# Patient Record
Sex: Female | Born: 1965 | Race: Black or African American | Hispanic: No | Marital: Single | Smoking: Current every day smoker
Health system: Southern US, Community
[De-identification: ages and names within clinical notes are randomized; demographics above are authoritative.]

## PROBLEM LIST (undated history)

## (undated) DIAGNOSIS — J449 Chronic obstructive pulmonary disease, unspecified: Secondary | ICD-10-CM

## (undated) DIAGNOSIS — I509 Heart failure, unspecified: Secondary | ICD-10-CM

## (undated) DIAGNOSIS — Z720 Tobacco use: Secondary | ICD-10-CM

## (undated) DIAGNOSIS — F419 Anxiety disorder, unspecified: Secondary | ICD-10-CM

## (undated) DIAGNOSIS — I10 Essential (primary) hypertension: Secondary | ICD-10-CM

## (undated) DIAGNOSIS — Z9289 Personal history of other medical treatment: Secondary | ICD-10-CM

## (undated) HISTORY — PX: ABDOMINAL HYSTERECTOMY: SHX81

## (undated) HISTORY — PX: CHOLECYSTECTOMY: SHX55

## (undated) HISTORY — PX: HERNIA REPAIR: SHX51

---

## 2005-01-09 ENCOUNTER — Emergency Department: Payer: Self-pay | Admitting: Emergency Medicine

## 2005-03-22 ENCOUNTER — Emergency Department: Payer: Self-pay | Admitting: Emergency Medicine

## 2005-06-09 ENCOUNTER — Other Ambulatory Visit: Payer: Self-pay

## 2005-06-09 ENCOUNTER — Emergency Department: Payer: Self-pay | Admitting: Emergency Medicine

## 2005-06-11 ENCOUNTER — Emergency Department: Payer: Self-pay | Admitting: General Practice

## 2006-04-19 ENCOUNTER — Emergency Department: Payer: Self-pay | Admitting: Internal Medicine

## 2006-05-17 ENCOUNTER — Emergency Department: Payer: Self-pay | Admitting: Emergency Medicine

## 2009-08-11 ENCOUNTER — Emergency Department: Payer: Self-pay | Admitting: Unknown Physician Specialty

## 2010-01-13 ENCOUNTER — Ambulatory Visit: Payer: Self-pay | Admitting: Family Medicine

## 2012-01-26 LAB — CBC
HCT: 44.7 % (ref 35.0–47.0)
HGB: 14.7 g/dL (ref 12.0–16.0)
MCH: 28.4 pg (ref 26.0–34.0)
MCHC: 33 g/dL (ref 32.0–36.0)
MCV: 86 fL (ref 80–100)
RBC: 5.19 10*6/uL (ref 3.80–5.20)
RDW: 14.5 % (ref 11.5–14.5)
WBC: 10.9 10*3/uL (ref 3.6–11.0)

## 2012-01-26 LAB — COMPREHENSIVE METABOLIC PANEL
Alkaline Phosphatase: 96 U/L (ref 50–136)
BUN: 6 mg/dL — ABNORMAL LOW (ref 7–18)
Bilirubin,Total: 0.6 mg/dL (ref 0.2–1.0)
Co2: 31 mmol/L (ref 21–32)
Creatinine: 0.91 mg/dL (ref 0.60–1.30)
EGFR (Non-African Amer.): >60
Glucose: 125 mg/dL — ABNORMAL HIGH (ref 65–99)
Osmolality: 278 (ref 275–301)
SGOT(AST): 21 U/L (ref 15–37)
SGPT (ALT): 29 U/L (ref 12–78)
Total Protein: 7.7 g/dL (ref 6.4–8.2)

## 2012-01-26 LAB — TROPONIN I: Troponin-I: <0.02 ng/mL

## 2012-01-27 ENCOUNTER — Inpatient Hospital Stay: Payer: Self-pay | Admitting: Family Medicine

## 2012-01-27 LAB — SEDIMENTATION RATE: Erythrocyte Sed Rate: 5 mm/hr (ref 0–20)

## 2012-01-27 LAB — CK TOTAL AND CKMB (NOT AT ARMC)
CK, Total: 71 U/L (ref 21–215)
CK, Total: 63 U/L (ref 21–215)
CK-MB: <0.5 ng/mL — ABNORMAL LOW (ref 0.5–3.6)
CK-MB: 0.6 ng/mL (ref 0.5–3.6)

## 2012-01-27 LAB — TROPONIN I: Troponin-I: <0.02 ng/mL

## 2012-01-27 LAB — TSH: Thyroid Stimulating Horm: 0.292 u[IU]/mL — ABNORMAL LOW

## 2012-01-27 LAB — T4, FREE: Free Thyroxine: 0.87 ng/dL (ref 0.76–1.46)

## 2012-01-27 LAB — HEMOGLOBIN A1C: Hemoglobin A1C: 6 % (ref 4.2–6.3)

## 2012-01-28 LAB — BASIC METABOLIC PANEL
Anion Gap: 7 (ref 7–16)
Calcium, Total: 9.2 mg/dL (ref 8.5–10.1)
EGFR (African American): >60
EGFR (Non-African Amer.): >60
Glucose: 155 mg/dL — ABNORMAL HIGH (ref 65–99)
Osmolality: 278 (ref 275–301)
Sodium: 138 mmol/L (ref 136–145)

## 2012-01-28 LAB — CBC WITH DIFFERENTIAL/PLATELET
Basophil #: 0.1 10*3/uL (ref 0.0–0.1)
Basophil %: 0.6 %
Eosinophil %: 0 %
HCT: 43.1 % (ref 35.0–47.0)
HGB: 13.9 g/dL (ref 12.0–16.0)
Lymphocyte #: 1.2 10*3/uL (ref 1.0–3.6)
Lymphocyte %: 6.4 %
MCV: 87 fL (ref 80–100)
Monocyte %: 2.9 %
Neutrophil #: 16.6 10*3/uL — ABNORMAL HIGH (ref 1.4–6.5)
Platelet: 299 10*3/uL (ref 150–440)
RBC: 4.97 10*6/uL (ref 3.80–5.20)
RDW: 14.4 % (ref 11.5–14.5)
WBC: 18.5 10*3/uL — ABNORMAL HIGH (ref 3.6–11.0)

## 2012-01-28 LAB — MAGNESIUM: Magnesium: 1.4 mg/dL — ABNORMAL LOW

## 2012-01-29 LAB — BASIC METABOLIC PANEL
Anion Gap: 6 — ABNORMAL LOW (ref 7–16)
BUN: 11 mg/dL (ref 7–18)
Creatinine: 0.77 mg/dL (ref 0.60–1.30)
EGFR (Non-African Amer.): >60
Glucose: 116 mg/dL — ABNORMAL HIGH (ref 65–99)
Osmolality: 278 (ref 275–301)
Potassium: 4.4 mmol/L (ref 3.5–5.1)

## 2012-03-20 ENCOUNTER — Emergency Department: Payer: Self-pay | Admitting: Unknown Physician Specialty

## 2012-03-20 LAB — COMPREHENSIVE METABOLIC PANEL
Albumin: 3.1 g/dL — ABNORMAL LOW (ref 3.4–5.0)
Alkaline Phosphatase: 123 U/L (ref 50–136)
Anion Gap: 8 (ref 7–16)
BUN: 8 mg/dL (ref 7–18)
Calcium, Total: 9 mg/dL (ref 8.5–10.1)
Chloride: 97 mmol/L — ABNORMAL LOW (ref 98–107)
Creatinine: 1.09 mg/dL (ref 0.60–1.30)
Potassium: 3.7 mmol/L (ref 3.5–5.1)
SGOT(AST): 43 U/L — ABNORMAL HIGH (ref 15–37)
SGPT (ALT): 52 U/L (ref 12–78)
Total Protein: 8.4 g/dL — ABNORMAL HIGH (ref 6.4–8.2)

## 2012-03-20 LAB — URINALYSIS, COMPLETE
Bilirubin,UR: NEGATIVE
Ketone: NEGATIVE
Protein: NEGATIVE
RBC,UR: <1 /HPF (ref 0–5)
Specific Gravity: 1.002 (ref 1.003–1.030)
Squamous Epithelial: <1
WBC UR: 6 /HPF (ref 0–5)

## 2012-03-20 LAB — CBC
HCT: 45.1 % (ref 35.0–47.0)
HGB: 15.2 g/dL (ref 12.0–16.0)
MCHC: 33.7 g/dL (ref 32.0–36.0)
RBC: 5.27 10*6/uL — ABNORMAL HIGH (ref 3.80–5.20)
RDW: 14.6 % — ABNORMAL HIGH (ref 11.5–14.5)
WBC: 11.7 10*3/uL — ABNORMAL HIGH (ref 3.6–11.0)

## 2012-03-20 LAB — PRO B NATRIURETIC PEPTIDE: B-Type Natriuretic Peptide: 119 pg/mL

## 2012-03-20 LAB — RAPID INFLUENZA A&B ANTIGENS

## 2012-03-20 LAB — MAGNESIUM: Magnesium: 1.5 mg/dL — ABNORMAL LOW

## 2012-03-20 LAB — LIPASE, BLOOD: Lipase: 73 U/L (ref 73–393)

## 2012-03-20 LAB — TSH: Thyroid Stimulating Horm: 1.41 u[IU]/mL

## 2012-03-20 LAB — TROPONIN I: Troponin-I: <0.02 ng/mL

## 2012-03-25 LAB — CULTURE, BLOOD (SINGLE)

## 2012-04-09 LAB — EXPECTORATED SPUTUM ASSESSMENT W GRAM STAIN, RFLX TO RESP C

## 2013-02-09 ENCOUNTER — Emergency Department: Payer: Self-pay | Admitting: Emergency Medicine

## 2013-02-09 LAB — CBC
HCT: 46.6 % (ref 35.0–47.0)
MCV: 86 fL (ref 80–100)
Platelet: 313 10*3/uL (ref 150–440)
RBC: 5.41 10*6/uL — ABNORMAL HIGH (ref 3.80–5.20)
RDW: 14.5 % (ref 11.5–14.5)
WBC: 9.6 10*3/uL (ref 3.6–11.0)

## 2013-02-09 LAB — PRO B NATRIURETIC PEPTIDE: B-Type Natriuretic Peptide: 38 pg/mL (ref 0–125)

## 2013-02-09 LAB — URINALYSIS, COMPLETE
Bacteria: NONE SEEN
Bilirubin,UR: NEGATIVE
Ketone: NEGATIVE
Leukocyte Esterase: NEGATIVE
Nitrite: NEGATIVE
Ph: 6 (ref 4.5–8.0)
Protein: NEGATIVE
Squamous Epithelial: 5

## 2013-02-09 LAB — BASIC METABOLIC PANEL
Anion Gap: 4 — ABNORMAL LOW (ref 7–16)
Calcium, Total: 9.2 mg/dL (ref 8.5–10.1)
Chloride: 102 mmol/L (ref 98–107)
Co2: 31 mmol/L (ref 21–32)
EGFR (Non-African Amer.): >60
Glucose: 92 mg/dL (ref 65–99)
Osmolality: 272 (ref 275–301)
Potassium: 4 mmol/L (ref 3.5–5.1)
Sodium: 137 mmol/L (ref 136–145)

## 2013-09-04 ENCOUNTER — Emergency Department: Payer: Self-pay | Admitting: Emergency Medicine

## 2013-09-04 LAB — CBC
HCT: 50.4 % — AB (ref 35.0–47.0)
HGB: 15.9 g/dL (ref 12.0–16.0)
MCH: 27.6 pg (ref 26.0–34.0)
MCHC: 31.5 g/dL — AB (ref 32.0–36.0)
MCV: 87 fL (ref 80–100)
Platelet: 342 10*3/uL (ref 150–440)
RBC: 5.77 10*6/uL — ABNORMAL HIGH (ref 3.80–5.20)
RDW: 14.3 % (ref 11.5–14.5)
WBC: 11.1 10*3/uL — AB (ref 3.6–11.0)

## 2013-09-04 LAB — BASIC METABOLIC PANEL
ANION GAP: 5 — AB (ref 7–16)
BUN: 6 mg/dL — ABNORMAL LOW (ref 7–18)
CHLORIDE: 100 mmol/L (ref 98–107)
Calcium, Total: 9.3 mg/dL (ref 8.5–10.1)
Co2: 32 mmol/L (ref 21–32)
Creatinine: 0.97 mg/dL (ref 0.60–1.30)
EGFR (African American): >60
Glucose: 108 mg/dL — ABNORMAL HIGH (ref 65–99)
Osmolality: 272 (ref 275–301)
POTASSIUM: 3.3 mmol/L — AB (ref 3.5–5.1)
SODIUM: 137 mmol/L (ref 136–145)

## 2013-09-04 LAB — TROPONIN I: Troponin-I: <0.02 ng/mL

## 2013-09-09 ENCOUNTER — Inpatient Hospital Stay: Payer: Self-pay | Admitting: Internal Medicine

## 2013-09-09 LAB — URINALYSIS, COMPLETE
BACTERIA: NONE SEEN
BILIRUBIN, UR: NEGATIVE
Blood: NEGATIVE
Glucose,UR: NEGATIVE mg/dL (ref 0–75)
Ketone: NEGATIVE
Leukocyte Esterase: NEGATIVE
Nitrite: NEGATIVE
PH: 6 (ref 4.5–8.0)
Protein: NEGATIVE
RBC,UR: <1 /HPF (ref 0–5)
SPECIFIC GRAVITY: 1.011 (ref 1.003–1.030)
WBC UR: 1 /HPF (ref 0–5)

## 2013-09-09 LAB — CBC
HCT: 46.9 % (ref 35.0–47.0)
HGB: 15.4 g/dL (ref 12.0–16.0)
MCH: 28.9 pg (ref 26.0–34.0)
MCHC: 32.8 g/dL (ref 32.0–36.0)
MCV: 88 fL (ref 80–100)
Platelet: 285 10*3/uL (ref 150–440)
RBC: 5.32 10*6/uL — ABNORMAL HIGH (ref 3.80–5.20)
RDW: 14.2 % (ref 11.5–14.5)
WBC: 8.2 10*3/uL (ref 3.6–11.0)

## 2013-09-09 LAB — COMPREHENSIVE METABOLIC PANEL
ALT: 31 U/L (ref 12–78)
Albumin: 3.5 g/dL (ref 3.4–5.0)
Alkaline Phosphatase: 109 U/L
Anion Gap: 7 (ref 7–16)
BILIRUBIN TOTAL: 0.3 mg/dL (ref 0.2–1.0)
BUN: 8 mg/dL (ref 7–18)
CALCIUM: 9.3 mg/dL (ref 8.5–10.1)
Chloride: 100 mmol/L (ref 98–107)
Co2: 31 mmol/L (ref 21–32)
Creatinine: 0.78 mg/dL (ref 0.60–1.30)
EGFR (Non-African Amer.): >60
Glucose: 82 mg/dL (ref 65–99)
Osmolality: 273 (ref 275–301)
Potassium: 3.8 mmol/L (ref 3.5–5.1)
SGOT(AST): 25 U/L (ref 15–37)
Sodium: 138 mmol/L (ref 136–145)
Total Protein: 8.2 g/dL (ref 6.4–8.2)

## 2013-09-09 LAB — TROPONIN I

## 2013-09-09 LAB — D-DIMER(ARMC): D-Dimer: 3104 ng/ml

## 2013-09-10 DIAGNOSIS — I369 Nonrheumatic tricuspid valve disorder, unspecified: Secondary | ICD-10-CM

## 2013-09-10 LAB — CBC WITH DIFFERENTIAL/PLATELET
BASOS PCT: 0.8 %
Basophil #: 0.1 10*3/uL (ref 0.0–0.1)
EOS ABS: 0.2 10*3/uL (ref 0.0–0.7)
Eosinophil %: 2.2 %
HCT: 44.1 % (ref 35.0–47.0)
HGB: 14.5 g/dL (ref 12.0–16.0)
LYMPHS PCT: 21.1 %
Lymphocyte #: 1.8 10*3/uL (ref 1.0–3.6)
MCH: 28.8 pg (ref 26.0–34.0)
MCHC: 32.8 g/dL (ref 32.0–36.0)
MCV: 88 fL (ref 80–100)
Monocyte #: 0.5 x10 3/mm (ref 0.2–0.9)
Monocyte %: 6.3 %
Neutrophil #: 5.8 10*3/uL (ref 1.4–6.5)
Neutrophil %: 69.6 %
Platelet: 290 10*3/uL (ref 150–440)
RBC: 5.02 10*6/uL (ref 3.80–5.20)
RDW: 13.8 % (ref 11.5–14.5)
WBC: 8.4 10*3/uL (ref 3.6–11.0)

## 2013-09-10 LAB — BASIC METABOLIC PANEL
Anion Gap: 6 — ABNORMAL LOW (ref 7–16)
BUN: 7 mg/dL (ref 7–18)
CALCIUM: 8.4 mg/dL — AB (ref 8.5–10.1)
CHLORIDE: 97 mmol/L — AB (ref 98–107)
Co2: 34 mmol/L — ABNORMAL HIGH (ref 21–32)
Creatinine: 0.79 mg/dL (ref 0.60–1.30)
EGFR (African American): >60
EGFR (Non-African Amer.): >60
Glucose: 106 mg/dL — ABNORMAL HIGH (ref 65–99)
Osmolality: 272 (ref 275–301)
Potassium: 3.3 mmol/L — ABNORMAL LOW (ref 3.5–5.1)
SODIUM: 137 mmol/L (ref 136–145)

## 2013-09-14 LAB — CULTURE, BLOOD (SINGLE)

## 2014-03-26 ENCOUNTER — Emergency Department: Payer: Self-pay | Admitting: Emergency Medicine

## 2014-03-26 LAB — URINALYSIS, COMPLETE
BILIRUBIN, UR: NEGATIVE
Blood: NEGATIVE
GLUCOSE, UR: NEGATIVE mg/dL (ref 0–75)
Ketone: NEGATIVE
LEUKOCYTE ESTERASE: NEGATIVE
NITRITE: NEGATIVE
PROTEIN: NEGATIVE
Ph: 6 (ref 4.5–8.0)
RBC,UR: 1 /HPF (ref 0–5)
SPECIFIC GRAVITY: 1.011 (ref 1.003–1.030)
Squamous Epithelial: 1

## 2014-03-26 LAB — COMPREHENSIVE METABOLIC PANEL
ALBUMIN: 3.2 g/dL — AB (ref 3.4–5.0)
Alkaline Phosphatase: 93 U/L
Anion Gap: 3 — ABNORMAL LOW (ref 7–16)
BUN: 6 mg/dL — ABNORMAL LOW (ref 7–18)
Bilirubin,Total: 0.6 mg/dL (ref 0.2–1.0)
CO2: 34 mmol/L — AB (ref 21–32)
Calcium, Total: 8.5 mg/dL (ref 8.5–10.1)
Chloride: 105 mmol/L (ref 98–107)
Creatinine: 0.76 mg/dL (ref 0.60–1.30)
EGFR (Non-African Amer.): >60
Glucose: 83 mg/dL (ref 65–99)
OSMOLALITY: 280 (ref 275–301)
Potassium: 3.9 mmol/L (ref 3.5–5.1)
SGOT(AST): 20 U/L (ref 15–37)
SGPT (ALT): 32 U/L
Sodium: 142 mmol/L (ref 136–145)
TOTAL PROTEIN: 7.4 g/dL (ref 6.4–8.2)

## 2014-03-26 LAB — CBC
HCT: 48.5 % — AB (ref 35.0–47.0)
HGB: 15.9 g/dL (ref 12.0–16.0)
MCH: 28.6 pg (ref 26.0–34.0)
MCHC: 32.7 g/dL (ref 32.0–36.0)
MCV: 87 fL (ref 80–100)
Platelet: 303 10*3/uL (ref 150–440)
RBC: 5.55 10*6/uL — ABNORMAL HIGH (ref 3.80–5.20)
RDW: 14.1 % (ref 11.5–14.5)
WBC: 5.3 10*3/uL (ref 3.6–11.0)

## 2014-03-26 LAB — TROPONIN I: Troponin-I: <0.02 ng/mL

## 2014-09-01 NOTE — H&P (Signed)
PATIENT NAME:  [name], [name] MR#:  [id number] DATE OF BIRTH:  August 10, 1965  DATE OF ADMISSION:  01/27/2012  REFERRING PHYSICIAN: Dr. Zenda Alpers in the Emergency Room  FAMILY PHYSICIAN: Non-local   REASON FOR ADMISSION: Acute respiratory failure with shortness of breath and atypical chest pain.   HISTORY OF PRESENT ILLNESS: The patient is a 49 year old female with a history of anxiety, reflux, and tobacco abuse who presents to the Emergency Room with a 1 to 2-day history of worsening shortness of breath associated with localized left chest pain radiating to the left arm. Pain is worse with deep inspiration. She does smoke a pack a day. No documented history of chronic obstructive pulmonary disease. She states that she has a history of "pulmonary nodules".  In the Emergency Room, the patient was found to have an elevated Angel-dimer. She was hypoxic. Chest x-ray revealed diffuse opacities with small nodules. No pulmonary embolism was noted. BNP was negative and her cardiac enzymes were unremarkable. She is now admitted for further evaluation.   PAST MEDICAL HISTORY:  1. Tobacco abuse.  2. Anxiety/depression.  3. Obesity.  4. Gastroesophageal reflux disease.  5. Fibrocystic breast disease.  6. Status post cholecystectomy.  7. Status post hysterectomy.   MEDICATIONS:  1. Zantac 150 mg p.o. twice a day p.r.n.  2. Flexeril 10 mg p.o. q. 8 hours p.r.n.  3. Ativan 0.5 mg p.o. q. 8 hours p.r.n.   ALLERGIES: No known drug allergies.   SOCIAL HISTORY: The patient smokes 1 pack per day. No history of alcohol abuse.   FAMILY HISTORY: Positive for diabetes, stroke, and hypertension.    REVIEW OF SYSTEMS: CONSTITUTIONAL: No fever or change in weight. EYES: No blurred or double vision. No glaucoma. ENT: No tinnitus or hearing loss. No nasal discharge or bleeding. No difficulty swallowing. RESPIRATORY: The patient has had cough and wheezing. She has had painful respirations. No hemoptysis. CARDIOVASCULAR: Chest  pain as per history of present illness. No orthopnea. Denies palpitations or syncope. GI: No nausea, vomiting, or diarrhea. No abdominal pain. No change in bowel habits. GU: No dysuria or hematuria. No incontinence. ENDOCRINE: No polyuria or polydipsia. No heat or cold intolerance. HEMATOLOGIC: The patient denies anemia, easy bruising, or bleeding. LYMPHATIC: No swollen glands. MUSCULOSKELETAL: The patient denies pain in her neck, back, shoulders, knees, or hips. No gout. NEUROLOGIC: No numbness or weakness. Denies migraines, stroke, or seizures. PSYCH: The patient denies anxiety, insomnia, or depression.   PHYSICAL EXAMINATION:  GENERAL: The patient is obese, in no acute distress.   VITAL SIGNS: Vital signs are currently remarkable for a blood pressure of 109/53, with a heart rate of 86 and a respiratory rate of 24. Temperature is 98.9. Sats 88% on room air.   HEENT: Normocephalic, atraumatic. Pupils equally round and reactive to light and accommodation. Extraocular movements are intact. Sclerae are anicteric. Conjunctivae are clear.  Oropharynx is clear.    NECK: Supple without jugular venous distention. No adenopathy or thyromegaly is noted.   LUNGS: Decreased breath sounds throughout. There are mild expiratory wheezes. No rhonchi or rales are noted. Respiratory effort is currently mildly increased.   CARDIAC: Regular rate and rhythm with normal S1 and S2. No significant rubs, murmurs, or gallops. PMI is nondisplaced. Chest wall is nontender.   ABDOMEN: Soft, nontender, with normoactive bowel sounds. No organomegaly or masses were appreciated. No hernias or bruits were noted.   EXTREMITIES: Trace edema. No clubbing or cyanosis. Pulses were 2+ bilaterally.   SKIN: Warm and dry  without rash or lesions.   NEUROLOGIC: Cranial nerves II through XII grossly intact. Deep tendon reflexes were symmetric. Motor and sensory examination nonfocal.   PSYCH: The patient was alert and oriented to person,  place, and time. She was cooperative and used good judgment.   LABORATORY, DIAGNOSTIC, AND RADIOLOGICAL DATA: EKG revealed sinus tachycardia with no acute ischemic changes. CT of the chest revealed no pulmonary embolism. There were diffuse abnormal lung opacities with ground-glass/interstitial opacities, tiny emphysematous changes and nodules. Prominent mediastinal nodes were noted. Troponin was less than 0.02. Total CK was 90 with an MB of less than 0.5. BNP was 42. Angel-dimer is 1.98. White count was 10.9 with a hemoglobin of 14.7. Glucose 125 with a BUN of 6 and a creatinine of 0.91 with a sodium of 140 and potassium 3. GFR was greater than 60.   ASSESSMENT:  1. Acute respiratory failure with hypoxia.  2. Presumed pneumonia.  3. Chronic obstructive pulmonary disease exacerbation.  4. Hyperglycemia.  5. Hypokalemia.  6. Obesity.  7. Anxiety.   PLAN: The patient will be admitted to telemetry. Given her chest pain, we will follow serial cardiac enzymes and obtain an echocardiogram. We will consult pulmonology in regards to the patient's acute respiratory insufficiency associated with her abnormal chest x-ray. We will begin IV steroids, IV antibiotics, SVNs, and oxygen. We will wean her oxygen as tolerated. We will follow her sugars while on steroids. We will supplement her potassium. Follow up routine labs and a chest x-ray within 24 hours. Further treatment and evaluation will depend upon the patient's progress.   TOTAL TIME SPENT: 50 minutes.  ____________________________ Duane LopeJeffrey Angel. Judithann SheenSparks, MD jds:bjt Angel:  01/27/2012 05:46:53 ET          T: 01/27/2012 08:07:21 ET         JOB#: [id number] [name] Angel Aws Shere MD ELECTRONICALLY SIGNED 01/29/2012 20:33

## 2014-09-01 NOTE — Discharge Summary (Signed)
PATIENT NAME:  [name], [name] MR#:  [id number] DATE OF BIRTH:  05-20-1965  DATE OF ADMISSION:  01/27/2012 DATE OF DISCHARGE:  01/29/2012  REASON FOR ADMISSION: Acute respiratory failure with shortness of breath and atypical chest pain.   CONDITION AT DISCHARGE: Good.  FOLLOW UP: [facility] at [facility].   HOSPITAL COURSE: [name] is a very nice 49 year old female with history of anxiety, reflux, tobacco abuse who came to the Emergency Room on 01/27/2012 with a history of worsening of shortness of breath during the past two days. The shortness of breath was associated with left chest pain radiating to the left arm which got worse with every deep inspiration. The patient also was having some cough that was not helping the pain.   The patient is a smoker and she smokes about a pack a day. She has no prior history of documented chronic obstructive pulmonary disease although the patient has been seen by [facility] for this problem and has a history of pulmonary nodules.   Her d-dimer was elevated for what she had a CT scan of the chest that failed to show any type of pulmonary embolism. The patient was hypoxic at the time but again no PE was found.   Her BNP was negative and she does not have any cardiac history. She did have cardiac enzymes that were negative. The patient was admitted for treatment of acute bronchitis, possible chronic obstructive pulmonary disease and pneumonia. The patient had bilateral opacities that could be related to community-acquired pneumonia for what the patient was put on Rocephin and azithromycin.   The patient had some improvement with steroids and after a couple of days of hospitalization she was discharged on the third day.   Her hypoxemia improved and she was able to be discharged on room air without any following oxygen.   As far as important results to mention, whenever she came over here her blood sugars were in the 120s. We did a hemoglobin A1c with which was 6.  Due to the steroids the blood sugars increased to 170s and 200s and at discharge they are around 129.   Her creatinine was normal, 0.7 at discharge. Electrolytes remained normal during this hospitalization, although at admission her potassium was decreased at 3.   Her magnesium was 1.4, and after repleting the magnesium was 2.   Her white count admission was 10.9, at discharge is 18.5 and this is due to the steroids. Cardiac enzymes were done and they were negative x3. She had a TSH that was 0.292 with a normal T4 free. She had a C-reactive protein which was elevated at 95 and we did an angiotensin coenzyme to rule out the possibility of sarcoid and it was normal at 27.   She had an echocardiogram Doppler that shows left ventricular systolic function with normal limits with an ejection fraction of 55, mild concentric left ventricular hypertrophy and trace mitral regurgitation. She had elevated pulmonary pressures of 30 to 40 mmHg with trace tricuspid regurgitation.   CT scan of the chest showed no pulmonary embolus, multiple small nodules through the lungs ill-defined some of them while others were well defined, the largest was measuring 6 mm. On the specific findings about inflammatory versus infectious process recommendation to follow up CT in three months. She does have mild enlarged mediastinal lymph nodes that were also nonspecific and suggest follow up on this. There is a 1.5 cm nodule on the right lobe of the thyroid as well.  Apparently the patient is aware of these nodules. [facility] what we are going to discharge with [facility]. As far as her thyroid disease the patient got a CT scan of the chest that had iodine contrast. We were not able to do an iodine uptake so the patient needs to have one within 30 days after the CT scan which was done on 09/14, so anywhere after 10/14 she could have the thyroid scan. We are going to send this discharge  summary [facility] so she follows up on that.    As far as her chest pain relief and it was mostly pleuritic. Other problems during this hospitalization: Smoking patient has been counseled about smoking cessation and she promised to try to quit.   As far as pneumonia the patient is going to be treated with Augmentin and azithromycin outpatient and she needs new PFTs to prove if this is chronic obstructive pulmonary disease or not.   Hyperglycemia was due to steroids. Her hemoglobin A1c was 6.   Thyroid nodule, as mentioned above, needs to be followed up within 30 days and her anxiety was stable during this hospitalization.   MEDICATIONS AT DISCHARGE: 1. Prednisone taper. 2. Albuterol inhaler. 3. Azithromycin 500 mg for 10 days. 4. Amoxicillin with clavulanic 875 twice daily. 5. Fluticasone salmeterol 1 puff twice a day.  6. Flexeril 10 mg daily.  7. Ranitidine 150 mg daily. 8. Clonazepam p.r.n. anxiety. 9. Aspirin 325 mg daily. 10. Zoloft 50 mg once daily.  11. As far as her inhalers the patient is going to be seeing her doctor sometime and she can get samples there. I am going to give her the inhaler that she was using over here and she didn't want to have a prescription since they are so expensive. We are going to give her two prescriptions form [facility] since the patient has to do self payment but the inhaler is the one that she is going to get through her primary care physician.   ____________________________ Felipa Furnaceoberto Sanchez Gutierrez, MD rsg:cms D: 01/29/2012 11:23:21 ET T: 01/29/2012 11:36:50 ET JOB#: [id number]  cc: Felipa Furnaceoberto Sanchez Gutierrez, MD, <Dictator> [facility]  Angel Marzo Juanda ChanceSANCHEZ GUTIERRE MD ELECTRONICALLY SIGNED 02/13/2012 13:41

## 2014-09-01 NOTE — Discharge Summary (Signed)
PATIENT NAME:  [name], Angel D MR#:  [id number] DATE OF BIRTH:  14-Jul-1965  DATE OF ADMISSION:  01/27/2012 DATE OF DISCHARGE:    ADDENDUM: Smoking cessation has been given to the patient and the patient promised to quit. She is going to try to get nicotine patches.   I spent about 38 minutes with this discharge.  ____________________________ Felipa Furnaceoberto Sanchez Gutierrez, MD rsg:slb D: 01/29/2012 11:24:29 ET T: 01/29/2012 11:29:53 ET JOB#: [id number]  cc: Felipa Furnaceoberto Sanchez Gutierrez, MD, <Dictator> Surya Folden Juanda ChanceSANCHEZ GUTIERRE MD ELECTRONICALLY SIGNED 02/13/2012 13:41

## 2014-09-05 NOTE — Discharge Summary (Signed)
PATIENT NAME:  [name], [name] MR#:  [id number] DATE OF BIRTH:  19-Nov-1965  DATE OF ADMISSION:  09/09/2013 DATE OF DISCHARGE:  09/12/2013  ADMITTING PHYSICIAN: Altamese DillingVaibhavkumar Vachhani, MD  DISCHARGING PHYSICIAN: Angel Baasadhika Kourosh Jablonsky, MD  PRIMARY CARE PHYSICIAN: None.   CONSULTATIONS IN THE HOSPITAL: None.   DISCHARGE DIAGNOSES: 1.  Left lower lobe pneumonia.  2.  Chronic obstructive pulmonary disease exacerbation.  3.  Anxiety disorder.  4.  Tobacco use disorder.  5.  Pleuritic chest pain.   DISCHARGE HOME MEDICATIONS: 1.  Klonopin 0.5 mg p.o. b.i.[name]. p.r.n. for anxiety.  2.  Prednisone taper.  3.  Percocet 5/325 mg 1 tablet q. 8 hours p.r.n. for pain.  4.  Fluconazole 100 mg p.o. daily for 5 days.  5.  Combivent Respimat 1 puff 4 times a day.  6.  Advair 250/50 one puff b.i.[name].  7.  Levaquin 750 mg p.o. daily for 4 more days.   DISCHARGE DIET: Regular diet.   DISCHARGE ACTIVITY: As tolerated.   FOLLOWUP INSTRUCTIONS: 1.  PCP set up and follow up in 2 weeks.  2.  Smoking cessation advised.  LABS AND IMAGING STUDIES: Prior to discharge: WBC is 8.4, hemoglobin 14.5, hematocrit 44.1, platelet count 219.   Sodium 137, potassium 3.3, chloride 97, bicarb 34, BUN 7, creatinine 0.79, glucose 106 and calcium 8.4.  Noted blood cultures are negative.  CT chest done for elevated [name]-dimer showing no evidence of any PE. Areas of infiltrate in both lungs, left effusion, mild interstitial prominence, nodular lesions seen. Results are in the discharge diagnosis. Pulmonary nodule [facility] which [facility]   Ultrasound Doppler of bilateral lower extremities showing no evidence of any DVT, neither extremity.   Echo Doppler showing normal LV ejection fraction, EF of 55% to 65%, impaired relaxation noted.   BRIEF HOSPITAL COURSE: [name] is a 49 year old obese African American female with a history of anxiety who comes in with shortness of breath and hypoxia, noted to have pneumonia  and COPD exacerbation.   Left lower lobe pneumonia and COPD exacerbation. The patient does not have a diagnosis of COPD however is a smoker for a long time and has chronic cough in the mornings with occasional wheezing. She did have a left lower lobe infiltrate seen on the chest x-ray, so she was started on IV antibiotics which improved her symptoms. She also had severe pleuritic chest pain on the left side that improved with treatment of pneumonia. She was given IV Solu-Medrol because she was hypoxic and wheezing and then gradually changed over to p.o. prednisone. She is also being discharged on fluconazole because of yeast infection while she is on antibiotics. Her sats have been averaging 90% to 92% on room air on exertion and also at rest due to her COPD status. She does not qualify for home oxygen. She has been doing fine prior to discharge. She was strongly counseled against smoking.   Her [name]-dimer was elevated which could have been non specific but due to her chest pain and hypoxia CT chest was done which was negative for PE and Dopplers were negative for any DVTs.   Her course has been otherwise uneventful in the hospital.   DISCHARGE CONDITION: Stable.   DISCHARGE DISPOSITION: Home.   TIME SPENT ON DISCHARGE: 40 minutes.  ____________________________ Angel Baasadhika Ayvion Kavanagh, MD rk:sb [name]: 09/12/2013 13:46:08 ET T: 09/12/2013 14:18:19 ET JOB#: [id number]  cc: Angel Baasadhika Dajsha Massaro, MD, <Dictator> Angel BaasADHIKA Ahkeem Goede MD ELECTRONICALLY SIGNED 10/01/2013 14:02

## 2014-09-05 NOTE — H&P (Signed)
PATIENT NAME:  [name], UPSHUR MR#:  [id number] DATE OF BIRTH:  1965/11/04  DATE OF ADMISSION:  09/09/2013  PRIMARY CARE PHYSICIAN: At [facility]  REFERRING EMERGENCY ROOM PHYSICIAN: Jene Every, MD   CHIEF COMPLAINT: Chest pain and shortness of breath.   HISTORY OF PRESENT ILLNESS: This is a 49 year old female with past history of anxiety, gastroesophageal reflux disease, fibrocystic breast disease, and tobacco abuse who at home for the last 1 week she started having some pain on the left side of her chest. She came to the Emergency Room actually on the 23rd of April but then left without any provider to see her and she was just at home, thought the pain would go away but was not going away. Gradually the pain was getting worse, and she also started having some dry cough. It was hurting to take deep breath and so she was taking shallow breathing. She was feeling slight short of breath so finally decided to come to the Emergency Room back again today. On work-up she was found having pneumonia on chest x-ray and CT scan so she is given as admission to hospitalist team. She is getting somewhat hypoxic on room air, as per the ER physician. On further questioning, the patient denies any fever, chills or sputum production.   REVIEW OF SYSTEMS:  CONSTITUTIONAL: Negative for fever, fatigue, weakness, pain or weight loss.  EYES: No blurring, double vision, discharge or redness.  EARS, NOSE, THROAT: No tinnitus, ear pain or hearing loss.  RESPIRATORY: The patient has cough which is dry, somewhat shortness of breath and painful respiration.  CARDIOVASCULAR: The patient has chest pain on the left side, which is worse with deep breathing. No orthopnea, edema, arrhythmia, palpitations.  GASTROINTESTINAL: No nausea, vomiting, diarrhea, abdominal pain.  GENITOURINARY: No dysuria, hematuria, or increased frequency.  ENDOCRINE: No heat or cold intolerance. No excessive sweating.  SKIN: No acne, rashes,  or lesions.  MUSCULOSKELETAL: No pain or swelling in the joints.  NEUROLOGIC: No numbness, weakness, tremor or vertigo.  PSYCHIATRIC: No anxiety, insomnia, bipolar disorder.   PAST MEDICAL HISTORY: 1.  Tobacco abuse.  2.  Anxiety and depression.  3.  Obesity.  4.  Gastroesophageal reflux disease. 5.  Fibrocystic breast disease.  6.  Status post cholecystectomy.  7.  Status post hysterectomy.   SOCIAL HISTORY: The patient is a smoker, smokes 1 pack per day for last 35 years. No alcohol. No illegal drug use.   FAMILY HISTORY: Positive for diabetes and hypertension.   HOME MEDICATIONS: Clonazepam 0.5 mg oral tablet 2 times a day as needed for anxiety and nervousness.   PHYSICAL EXAMINATION: VITAL SIGNS: In the ER, pulse is 95, respirations 20, blood pressure is 160/94, and pulse ox is ranging from 88 to 92 on room air while talking to me and with oxygen supplementation goes up to 96.  GENERAL: The patient is obese, fully alert and oriented to time, place, and person, slight distress due to pain, otherwise cooperative with history taking and physical examination.  HEENT: Head and neck atraumatic. Conjunctiva pink. Oral mucosa moist.  NECK: Supple. No JVD.  RESPIRATORY: Bilateral equal air entry, slight crepitation on the left side, lower lobe.  CARDIOVASCULAR: S1 and S2 present, regular. No murmur.  ABDOMEN: Soft, nontender. Bowel sounds present. No organomegaly.  SKIN: No rashes.  LEGS: No edema.  NEUROLOGIC: Power 5/5. Follows commands. Moves all 4 limbs. JOINTS: No swelling or tenderness.  PSYCHIATRIC: Does not appear in any acute psychiatric illness  at this time.   IMPORTANT DIAGNOSTIC DATA: Glucose 82, BUN 8, creatinine 0.78, sodium 138, potassium 3.8, chloride 100, CO2 31, calcium 9.3. Total protein is 8.2, albumin 3.5, bilirubin 0.3, alkaline phosphate 109, SGOT 25, and SGPT 32. Troponin is less than 0.02. WBC is 8.2, hemoglobin 15.4, platelet count 285,000, and MCV is 88.  D-dimer is 3104.   Urinalysis is grossly negative.   CT angiogram of the chest is done which shows area of infiltrate in both lung bases. There is left effusion, mild interstitial prominence, likely representing edema. No pulmonary embolism. Nodular lesion in the right middle lobe and 1 in the right lower lobe, remains stable. There is a nodule in the thyroid.   Chest x-ray, portable, shows increased left basilar opacity noted concerning for pneumonia or atelectasis.   ASSESSMENT AND PLAN: A 49 year old female with past medical history of smoking and anxiety came to Emergency Room after having chest pain and some shortness of breath with pleuritic type of pain for the last 1 week, found having pneumonia on chest x-ray and CAT scan.  1.  Pneumonia. Will treat with Rocephin and azithromycin  2.  Chest pain. This is most likely pleuritic because of the pneumonia and pleural effusion. Will treat the underlying cause and give Tylenol as needed.  3.  Elevated d-dimer. CT scan is done by ER physician. There is no pulmonary embolism.  4.  Anxiety. Will continue clonazepam as needed basis.  5.  Smoking. Smoking cessation counseling is done for 4 minutes, and she agreed to try a nicotine patch at this time.   CODE STATUS: FULL.  TOTAL TIME SPENT ON THIS ADMISSION: 50 minutes.    ____________________________ Hope PigeonVaibhavkumar G. Elisabeth PigeonVachhani, MD vgv:sb D: 09/09/2013 15:55:11 ET T: 09/09/2013 16:19:59 ET JOB#: [id number]  cc: Hope PigeonVaibhavkumar G. Elisabeth PigeonVachhani, MD, <Dictator> Altamese DillingVAIBHAVKUMAR Kamila Broda MD ELECTRONICALLY SIGNED 09/22/2013 22:16

## 2016-01-03 ENCOUNTER — Emergency Department: Payer: Self-pay

## 2016-01-03 ENCOUNTER — Encounter: Payer: Self-pay | Admitting: Emergency Medicine

## 2016-01-03 DIAGNOSIS — F17210 Nicotine dependence, cigarettes, uncomplicated: Secondary | ICD-10-CM | POA: Insufficient documentation

## 2016-01-03 DIAGNOSIS — J441 Chronic obstructive pulmonary disease with (acute) exacerbation: Secondary | ICD-10-CM | POA: Insufficient documentation

## 2016-01-03 DIAGNOSIS — J069 Acute upper respiratory infection, unspecified: Secondary | ICD-10-CM | POA: Insufficient documentation

## 2016-01-03 NOTE — ED Triage Notes (Signed)
Patient ambulatory to triage with steady gait, without difficulty or distress noted; pt reports x 2wks having chills, sinus drainage, initial productive cough yellow sputum but now nonprod

## 2016-01-04 ENCOUNTER — Emergency Department
Admission: EM | Admit: 2016-01-04 | Discharge: 2016-01-04 | Disposition: A | Discharge status: Home / Self Care | Payer: Self-pay | Attending: Emergency Medicine | Admitting: Emergency Medicine

## 2016-01-04 DIAGNOSIS — J441 Chronic obstructive pulmonary disease with (acute) exacerbation: Secondary | ICD-10-CM

## 2016-01-04 DIAGNOSIS — R0982 Postnasal drip: Secondary | ICD-10-CM

## 2016-01-04 DIAGNOSIS — J069 Acute upper respiratory infection, unspecified: Secondary | ICD-10-CM

## 2016-01-04 HISTORY — DX: Chronic obstructive pulmonary disease, unspecified: J44.9

## 2016-01-04 MED ORDER — AZITHROMYCIN 250 MG PO TABS: ORAL_TABLET | ORAL | 0 refills | Status: AC

## 2016-01-04 MED ORDER — PREDNISONE 20 MG PO TABS: 60.0000 mg | ORAL_TABLET | Freq: Every day | ORAL | 0 refills | Status: AC

## 2016-01-04 MED ORDER — AZITHROMYCIN 500 MG PO TABS
500.0000 mg | ORAL_TABLET | Freq: Once | ORAL | Status: AC
  Administered 2016-01-04: 500 mg via ORAL
  Filled 2016-01-04: qty 1

## 2016-01-04 MED ORDER — IPRATROPIUM-ALBUTEROL 0.5-2.5 (3) MG/3ML IN SOLN
3.0000 mL | Freq: Once | RESPIRATORY_TRACT | Status: AC
  Administered 2016-01-04: 3 mL via RESPIRATORY_TRACT
  Filled 2016-01-04: qty 3

## 2016-01-04 MED ORDER — PREDNISONE 20 MG PO TABS
60.0000 mg | ORAL_TABLET | Freq: Once | ORAL | Status: AC
  Administered 2016-01-04: 60 mg via ORAL
  Filled 2016-01-04: qty 3

## 2016-01-04 NOTE — ED Provider Notes (Signed)
[facility] Emergency Department Provider Note   ____________________________________________   First MD Initiated Contact with Patient 01/04/16 [id number]     (approximate)  I have reviewed the triage vital signs and the nursing notes.   HISTORY  Chief Complaint Nasal Congestion and Cough    HPI [name] is a 50 y.o. female with a history of COPD who smokes one pack of cigarettes per day and is presenting to the emergency department with 2 weeks of nasal congestion as well as coughing, especially when she lays back. She denies any fever. Denies ear pressure. Denies any sore throat. Denies any pain. York SpanielSaid that her mother had an illness with similar symptoms recently as well. Says she has tried Afrin at home as well as an inhaler without symptomatic relief.   Past Medical History:  Diagnosis Date  . COPD (chronic obstructive pulmonary disease) (HCC)     There are no active problems to display for this patient.   Past Surgical History:  Procedure Laterality Date  . ABDOMINAL HYSTERECTOMY    . CHOLECYSTECTOMY    . HERNIA REPAIR      Prior to Admission medications   Not on File    Allergies Review of patient's allergies indicates no known allergies.  No family history on file.  Social History Social History  Substance Use Topics  . Smoking status: Current Every Day Smoker    Packs/day: 0.50    Types: Cigarettes  . Smokeless tobacco: Never Used  . Alcohol use No    Review of Systems Constitutional: No fever/chills Eyes: No visual changes. ENT: No sore throat. Cardiovascular: Denies chest pain. Respiratory: Cough Gastrointestinal: No abdominal pain.  No nausea, no vomiting.  No diarrhea.  No constipation. Genitourinary: Negative for dysuria. Musculoskeletal: Negative for back pain. Skin: Negative for rash. Neurological: Negative for headaches, focal weakness or numbness.  10-point ROS otherwise  negative.  ____________________________________________   PHYSICAL EXAM:  VITAL SIGNS: ED Triage Vitals  Enc Vitals Group     BP 01/04/16 0456 (!) 139/91     Pulse Rate 01/04/16 0456 94     Resp 01/04/16 0456 (!) 26     Temp --      Temp src --      SpO2 01/04/16 0456 98 %     Weight 01/03/16 2322 265 lb (120.2 kg)     Height 01/03/16 2322 5' (1.524 m)     Head Circumference --      Peak Flow --      Pain Score --      Pain Loc --      Pain Edu? --      Excl. in GC? --     Constitutional: Alert and oriented. Well appearing and in no acute distress. Eyes: Conjunctivae are normal. PERRL. EOMI. Head: Atraumatic. Nose: Bilateral congestion, especially to the right now. Mouth/Throat: Mucous membranes are moist.  No pharyngeal erythema or swelling. Neck: No stridor.   Cardiovascular: Normal rate, regular rhythm. Grossly normal heart sounds.   Respiratory: Normal respiratory effort.  No retractions. Scant wheezes throughout. Gastrointestinal: Soft and nontender. No distention. No abdominal bruits. No CVA tenderness. Musculoskeletal: No lower extremity tenderness nor edema.  No joint effusions. Neurologic:  Normal speech and language. No gross focal neurologic deficits are appreciated.  Skin:  Skin is warm, dry and intact. No rash noted. Psychiatric: Mood and affect are normal. Speech and behavior are normal.  ____________________________________________   LABS (all labs ordered are listed,  but only abnormal results are displayed)  Labs Reviewed - No data to display ____________________________________________  EKG   ____________________________________________  RADIOLOGY  DG Chest 2 View (Accession [id number]) (Order [id number])  Imaging  Date: 01/03/2016 Department: [facility] EMERGENCY DEPARTMENT Released By: Gordy LevanLisa Flynn Thompson, RN (auto-released) Authorizing: Myrna Blazeravid Matthew Zaleah Ternes, MD  PACS Images   Show images for DG Chest 2 View  Study  Result   CLINICAL DATA:  Cough. Cough for 2 weeks, initially productive, non nonproductive. History of emphysema.  EXAM: CHEST  2 VIEW  COMPARISON:  Most recent chest imaging radiographs 03/26/2014, chest CT 09/09/2013  FINDINGS: Interstitial coarsening is chronic, however a possible mild progression. Heart size and mediastinal contours are unchanged, heart at the upper limits normal in size. No focal airspace opacity, pleural effusion or pneumothorax. No acute osseous abnormality is seen.  IMPRESSION: Chronic lung disease with interstitial thickening. Possible mild progression over the past 20 months. No focal airspace opacity to suggest pneumonia.   Electronically Signed   By: Rubye OaksMelanie  Ehinger M.D.   On: 01/04/2016 00:03     ____________________________________________   PROCEDURES  Procedure(s) performed:   Procedures  Critical Care performed:   ____________________________________________   INITIAL IMPRESSION / ASSESSMENT AND PLAN / ED COURSE  Pertinent labs & imaging results that were available during my care of the patient were reviewed by me and considered in my medical decision making (see chart for details).  We will treat with DuoNeb. Patient with COPD with scant wheezing and likely URI with postnasal drip. We will prescribe steroids as well as azithromycin.  Clinical Course   ----------------------------------------- 6:44 AM on 01/04/2016 -----------------------------------------  After a breathing treatment the patient is feeling improved although she is still wheezing. I offered her further treatments but she says she would rather go home. She says she has an inhaler. She denies feeling short of breath despite the wheezing. She is in no respiratory distress. Respiratory rate of 18 without any retractions being in full sentences. She will continue to use her inhaler as needed as well as the steroids and antibiotics. She'll be discharged home  and will [facility].  ____________________________________________   FINAL CLINICAL IMPRESSION(S) / ED DIAGNOSES  URI. COPD exacerbation. Postnasal drip.    NEW MEDICATIONS STARTED DURING THIS VISIT:  New Prescriptions   No medications on file     Note:  This document was prepared using Dragon voice recognition software and may include unintentional dictation errors.    Myrna Blazeravid Matthew Ameliyah Sarno, MD 01/04/16 [phone]

## 2016-09-29 ENCOUNTER — Encounter: Payer: Self-pay | Admitting: *Deleted

## 2016-09-29 ENCOUNTER — Emergency Department: Payer: Self-pay

## 2016-09-29 ENCOUNTER — Emergency Department
Admission: EM | Admit: 2016-09-29 | Discharge: 2016-09-30 | Discharge status: Against Medical Advice | Payer: Self-pay | Attending: Emergency Medicine | Admitting: Emergency Medicine

## 2016-09-29 DIAGNOSIS — D750 Familial erythrocytosis: Secondary | ICD-10-CM | POA: Insufficient documentation

## 2016-09-29 DIAGNOSIS — Z5320 Procedure and treatment not carried out because of patient's decision for unspecified reasons: Secondary | ICD-10-CM | POA: Insufficient documentation

## 2016-09-29 DIAGNOSIS — R091 Pleurisy: Secondary | ICD-10-CM | POA: Insufficient documentation

## 2016-09-29 DIAGNOSIS — J189 Pneumonia, unspecified organism: Secondary | ICD-10-CM | POA: Insufficient documentation

## 2016-09-29 DIAGNOSIS — F17210 Nicotine dependence, cigarettes, uncomplicated: Secondary | ICD-10-CM | POA: Insufficient documentation

## 2016-09-29 DIAGNOSIS — J181 Lobar pneumonia, unspecified organism: Secondary | ICD-10-CM

## 2016-09-29 DIAGNOSIS — J449 Chronic obstructive pulmonary disease, unspecified: Secondary | ICD-10-CM | POA: Insufficient documentation

## 2016-09-29 LAB — BASIC METABOLIC PANEL
Anion gap: 8 (ref 5–15)
BUN: 9 mg/dL (ref 6–20)
CO2: 32 mmol/L (ref 22–32)
CREATININE: 0.77 mg/dL (ref 0.44–1.00)
Calcium: 9.4 mg/dL (ref 8.9–10.3)
Chloride: 99 mmol/L — ABNORMAL LOW (ref 101–111)
GFR calc Af Amer: >60 mL/min (ref >60)
GLUCOSE: 106 mg/dL — AB (ref 65–99)
Potassium: 4.2 mmol/L (ref 3.5–5.1)
Sodium: 139 mmol/L (ref 135–145)

## 2016-09-29 LAB — FIBRIN DERIVATIVES D-DIMER (ARMC ONLY): FIBRIN DERIVATIVES D-DIMER (ARMC): 1171.01 — AB (ref 0.00–499.00)

## 2016-09-29 LAB — CBC
HEMATOCRIT: 58 % — AB (ref 35.0–47.0)
Hemoglobin: 18.7 g/dL — ABNORMAL HIGH (ref 12.0–16.0)
MCH: 27.1 pg (ref 26.0–34.0)
MCHC: 32.3 g/dL (ref 32.0–36.0)
MCV: 84 fL (ref 80.0–100.0)
PLATELETS: 271 10*3/uL (ref 150–440)
RBC: 6.9 MIL/uL — ABNORMAL HIGH (ref 3.80–5.20)
RDW: 16.8 % — AB (ref 11.5–14.5)
WBC: 7.4 10*3/uL (ref 3.6–11.0)

## 2016-09-29 LAB — TROPONIN I: Troponin I: <0.03 ng/mL (ref <0.03)

## 2016-09-29 MED ORDER — OXYCODONE-ACETAMINOPHEN 5-325 MG PO TABS
ORAL_TABLET | ORAL | Status: AC
  Administered 2016-09-29: 2 via ORAL
  Filled 2016-09-29: qty 2

## 2016-09-29 MED ORDER — IOPAMIDOL (ISOVUE-370) INJECTION 76%
75.0000 mL | Freq: Once | INTRAVENOUS | Status: AC | PRN
  Administered 2016-09-29: 75 mL via INTRAVENOUS
  Filled 2016-09-29: qty 75

## 2016-09-29 MED ORDER — OXYCODONE-ACETAMINOPHEN 5-325 MG PO TABS
2.0000 | ORAL_TABLET | Freq: Once | ORAL | Status: AC
  Administered 2016-09-29: 2 via ORAL

## 2016-09-29 NOTE — ED Triage Notes (Signed)
Pt reports chest pain under left breast starting on Wednesday, pt denies any other symptoms

## 2016-09-29 NOTE — ED Provider Notes (Signed)
[facility] Emergency Department Provider Note       Time seen: ----------------------------------------- 9:22 PM on 09/29/2016 -----------------------------------------     I have reviewed the triage vital signs and the nursing notes.   HISTORY   Chief Complaint Chest Pain    HPI [name] is a 51 y.o. female who presents to the ED for left-sided chest pain that began on her left breast on Wednesday. Patient states this began after an episode of coughing and then it hurts whenever she moves. She does not have significant pain with breathing but if she takes a really deep breath she does have pain. Currently is 3 out of 10. She denies fevers, chills or other complaints.   Past Medical History:  Diagnosis Date  . COPD (chronic obstructive pulmonary disease) (HCC)     There are no active problems to display for this patient.   Past Surgical History:  Procedure Laterality Date  . ABDOMINAL HYSTERECTOMY    . CHOLECYSTECTOMY    . HERNIA REPAIR      Allergies Patient has no known allergies.  Social History Social History  Substance Use Topics  . Smoking status: Current Every Day Smoker    Packs/day: 0.50    Types: Cigarettes  . Smokeless tobacco: Never Used  . Alcohol use No    Review of Systems Constitutional: Negative for fever. Cardiovascular: Positive for chest pain Respiratory: Negative for shortness of breath. Gastrointestinal: Negative for abdominal pain, vomiting and diarrhea. Genitourinary: Negative for dysuria. Musculoskeletal: Negative for back pain. Skin: Negative for rash. Neurological: Negative for headaches, focal weakness or numbness.  All systems negative/normal/unremarkable except as stated in the HPI  ____________________________________________   PHYSICAL EXAM:  VITAL SIGNS: ED Triage Vitals  Enc Vitals Group     BP 09/29/16 2114 132/68     Pulse Rate 09/29/16 2114 (!) 105     Resp 09/29/16 2114 20      Temp 09/29/16 2114 98 F (36.7 C)     Temp Source 09/29/16 2114 Oral     SpO2 09/29/16 2114 98 %     Weight 09/29/16 2112 260 lb (117.9 kg)     Height 09/29/16 2112 5' (1.524 m)     Head Circumference --      Peak Flow --      Pain Score 09/29/16 2112 4     Pain Loc --      Pain Edu? --      Excl. in GC? --     Constitutional: Alert and oriented. Well appearing and in no distress. Eyes: Conjunctivae are normal. PERRL. Normal extraocular movements. ENT   Head: Normocephalic and atraumatic.   Nose: No congestion/rhinnorhea.   Mouth/Throat: Mucous membranes are moist.   Neck: No stridor. Cardiovascular: Normal rate, regular rhythm. No murmurs, rubs, or gallops. Respiratory: Normal respiratory effort without tachypnea nor retractions. Breath sounds are clear and equal bilaterally. No wheezes/rales/rhonchi. Gastrointestinal: Soft and nontender. Normal bowel sounds Musculoskeletal: Nontender with normal range of motion in extremities. No lower extremity tenderness nor edema. Neurologic:  Normal speech and language. No gross focal neurologic deficits are appreciated.  Skin:  Skin is warm, dry and intact. No rash noted. Psychiatric: Mood and affect are normal. Speech and behavior are normal.  ____________________________________________  EKG: Interpreted by me. Sinus tachycardia with a rate of 106 bpm, normal PR interval, normal QRS, normal QT, left anterior fascicular block  ____________________________________________  ED COURSE:  Pertinent labs & imaging results that were available  during my care of the patient were reviewed by me and considered in my medical decision making (see chart for details). Patient presents for chest pain, we will assess with labs and imaging as indicated.   Procedures ____________________________________________   LABS (pertinent positives/negatives)  Labs Reviewed  BASIC METABOLIC PANEL - Abnormal; Notable for the following:        Result Value   Chloride 99 (*)    Glucose, Bld 106 (*)    All other components within normal limits  CBC - Abnormal; Notable for the following:    RBC 6.90 (*)    Hemoglobin 18.7 (*)    HCT 58.0 (*)    RDW 16.8 (*)    All other components within normal limits  FIBRIN DERIVATIVES D-DIMER ([facility] ONLY) - Abnormal; Notable for the following:    Fibrin derivatives D-dimer ([facility]) 1,171.01 (*)    All other components within normal limits  TROPONIN I    RADIOLOGY  Chest x-ray is normal CT angiogram of the chest is pending ____________________________________________  FINAL ASSESSMENT AND PLAN  Chest pain, erythrocytosis  Plan: Patient's labs and imaging were dictated above. Patient had presented for chest pain of uncertain etiology. Pain seems to be musculoskeletal in origin but patient had a markedly elevated d-dimer for which CT was ordered.   Emily FilbertWilliams, Antaeus Karel E, MD   Note: This note was generated in part or whole with voice recognition software. Voice recognition is usually quite accurate but there are transcription errors that can and very often do occur. I apologize for any typographical errors that were not detected and corrected.     Emily FilbertWilliams, Erin Uecker E, MD 09/29/16 [id number]

## 2016-09-29 NOTE — ED Notes (Signed)
Patient transported to X-ray 

## 2016-09-29 NOTE — ED Notes (Signed)
Patient transported to CT 

## 2016-09-30 MED ORDER — LEVOFLOXACIN 500 MG PO TABS
500.0000 mg | ORAL_TABLET | Freq: Once | ORAL | Status: AC
  Administered 2016-09-30: 500 mg via ORAL
  Filled 2016-09-30: qty 1

## 2016-09-30 MED ORDER — LEVOFLOXACIN 500 MG PO TABS: 500.0000 mg | ORAL_TABLET | Freq: Every day | ORAL | 0 refills | Status: AC

## 2016-09-30 NOTE — ED Notes (Signed)
Pt's O2 sats noted to be in the low to mid 80% range; MD notified; Pt put on 2L via [place]

## 2016-09-30 NOTE — ED Notes (Signed)
Pt is leaving against medical advice; pt verbalized understanding of risk posed to her health in leaving hospital care in her condition; MD and this RN attempted to talk to patient and tried to convince her to reconsider her decision; pt has decided to leave care despite numerous attempts to convince her to stay; discharge instructions reviewed, follow up care and home care reviewed, prescription medication reviewed; pt verbalized understanding; pt is ambulatory and left ED with her husband.

## 2016-09-30 NOTE — ED Provider Notes (Signed)
I assumed care of the patient at 11:00PM. Patient's CT scan consistent with pneumonia: CLINICAL DATA:  Pleurisy.  Left-sided chest pain.  EXAM: CT ANGIOGRAPHY CHEST WITH CONTRAST  TECHNIQUE: Multidetector CT imaging of the chest was performed using the standard protocol during bolus administration of intravenous contrast. Multiplanar CT image reconstructions and MIPs were obtained to evaluate the vascular anatomy.  CONTRAST:  75 cc Isovue 370 IV  COMPARISON:  Radiographs earlier this day.  Chest CT 09/09/2013  FINDINGS: Cardiovascular: There are no filling defects within the pulmonary arteries to suggest pulmonary embolus. Enlarged main pulmonary artery consistent with pulmonary arterial hypertension. Scattered coronary artery calcifications. There is multi chamber cardiomegaly. Thoracic aorta is normal in caliber with trace atherosclerosis.  Mediastinum/Nodes: Unchanged enlarged prevascular node measuring 15 mm, previously 16 mm. Small prominent mediastinal lymph nodes are also similar. No hilar adenopathy. No pericardial effusion. A 1 cm left subpleural lymph node is unchanged. The esophagus decompressed. No evidence of thyroid nodule.  Lungs/Pleura: Mild emphysema. Scattered small irregular cysts throughout both lungs, in a mid and upper lungs on prominence. There is a 7 x 6 mm nodule in the right middle lobe image 45 series 6, unchanged from prior exam. A subpleural 5 mm nodule in the right lower lobe image 52 has decreased from prior. No new pulmonary nodule. Small left basilar consolidation with air bronchograms. There is trace pleural thickening but no frank effusion. No confluent airspace disease.  Upper Abdomen: No acute abnormality. Liver appears prominent in size, partially included.  Musculoskeletal: There are no acute or suspicious osseous abnormalities.  Review of the MIP images confirms the above findings.  IMPRESSION: 1. No pulmonary embolus.  Enlarged main pulmonary artery consistent with pulmonary arterial hypertension. 2. Mild left lower lobe consolidation with air bronchograms, suspicious for pneumonia. Trace left pleural thickening. 3. Emphysema. Small irregular pulmonary cysts. 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 4. Pulmonary nodules in the right middle and right lower lobe are unchanged to minimally decreased from exam 3 years prior, consistent with benign etiology. No discrete further follow-up is needed. Enlarged prevascular lymph node is also stable, suggesting reactive etiology.   Electronically Signed   By: Rubye OaksMelanie  Ehinger M.D.   On: 09/29/2016 23:25  Patient oximetry 84% on room air which she states "is normal for me". I informed the patient that she needed to be admitted to the hospital however she states that she cannot second to the fact that she has to go home and take care of her mother who has dementia. I spoke with the patient at length regarding however she adamantly stated that she needed to leave. I urged patient to return to the emergency department immediately if worsening breathing were to ensue. Patient leaving the emergency department AGAINST MEDICAL ADVICE   Darci CurrentBrown, Franks Field N, MD 09/30/16 [phone]

## 2017-06-07 ENCOUNTER — Inpatient Hospital Stay: Payer: Medicaid Other

## 2017-06-07 ENCOUNTER — Inpatient Hospital Stay
Admission: EM | Admit: 2017-06-07 | Discharge: 2017-06-12 | DRG: 871 | Disposition: A | Discharge status: Home / Self Care | Payer: Medicaid Other | Attending: Internal Medicine | Admitting: Internal Medicine

## 2017-06-07 ENCOUNTER — Emergency Department: Payer: Medicaid Other

## 2017-06-07 ENCOUNTER — Other Ambulatory Visit: Payer: Self-pay

## 2017-06-07 ENCOUNTER — Encounter: Payer: Self-pay | Admitting: Emergency Medicine

## 2017-06-07 DIAGNOSIS — I2721 Secondary pulmonary arterial hypertension: Secondary | ICD-10-CM | POA: Diagnosis present

## 2017-06-07 DIAGNOSIS — F17210 Nicotine dependence, cigarettes, uncomplicated: Secondary | ICD-10-CM | POA: Diagnosis present

## 2017-06-07 DIAGNOSIS — Z6841 Body Mass Index (BMI) 40.0 and over, adult: Secondary | ICD-10-CM

## 2017-06-07 DIAGNOSIS — Z79899 Other long term (current) drug therapy: Secondary | ICD-10-CM | POA: Diagnosis not present

## 2017-06-07 DIAGNOSIS — J9621 Acute and chronic respiratory failure with hypoxia: Secondary | ICD-10-CM | POA: Diagnosis present

## 2017-06-07 DIAGNOSIS — Z7982 Long term (current) use of aspirin: Secondary | ICD-10-CM | POA: Diagnosis not present

## 2017-06-07 DIAGNOSIS — J440 Chronic obstructive pulmonary disease with acute lower respiratory infection: Secondary | ICD-10-CM | POA: Diagnosis present

## 2017-06-07 DIAGNOSIS — R609 Edema, unspecified: Secondary | ICD-10-CM

## 2017-06-07 DIAGNOSIS — G253 Myoclonus: Secondary | ICD-10-CM | POA: Diagnosis present

## 2017-06-07 DIAGNOSIS — Z7951 Long term (current) use of inhaled steroids: Secondary | ICD-10-CM

## 2017-06-07 DIAGNOSIS — I110 Hypertensive heart disease with heart failure: Secondary | ICD-10-CM | POA: Diagnosis present

## 2017-06-07 DIAGNOSIS — R259 Unspecified abnormal involuntary movements: Secondary | ICD-10-CM | POA: Diagnosis not present

## 2017-06-07 DIAGNOSIS — J181 Lobar pneumonia, unspecified organism: Secondary | ICD-10-CM | POA: Diagnosis present

## 2017-06-07 DIAGNOSIS — J441 Chronic obstructive pulmonary disease with (acute) exacerbation: Secondary | ICD-10-CM | POA: Diagnosis present

## 2017-06-07 DIAGNOSIS — E662 Morbid (severe) obesity with alveolar hypoventilation: Secondary | ICD-10-CM | POA: Diagnosis present

## 2017-06-07 DIAGNOSIS — R0902 Hypoxemia: Secondary | ICD-10-CM

## 2017-06-07 DIAGNOSIS — J9620 Acute and chronic respiratory failure, unspecified whether with hypoxia or hypercapnia: Secondary | ICD-10-CM | POA: Diagnosis not present

## 2017-06-07 DIAGNOSIS — F419 Anxiety disorder, unspecified: Secondary | ICD-10-CM | POA: Diagnosis present

## 2017-06-07 DIAGNOSIS — J189 Pneumonia, unspecified organism: Secondary | ICD-10-CM | POA: Diagnosis present

## 2017-06-07 DIAGNOSIS — J9601 Acute respiratory failure with hypoxia: Secondary | ICD-10-CM | POA: Diagnosis not present

## 2017-06-07 DIAGNOSIS — R0602 Shortness of breath: Secondary | ICD-10-CM

## 2017-06-07 DIAGNOSIS — I5031 Acute diastolic (congestive) heart failure: Secondary | ICD-10-CM | POA: Diagnosis present

## 2017-06-07 DIAGNOSIS — A419 Sepsis, unspecified organism: Principal | ICD-10-CM | POA: Diagnosis present

## 2017-06-07 HISTORY — DX: Anxiety disorder, unspecified: F41.9

## 2017-06-07 HISTORY — DX: Tobacco use: Z72.0

## 2017-06-07 HISTORY — DX: Morbid (severe) obesity due to excess calories: E66.01

## 2017-06-07 HISTORY — DX: Personal history of other medical treatment: Z92.89

## 2017-06-07 HISTORY — DX: Essential (primary) hypertension: I10

## 2017-06-07 LAB — COMPREHENSIVE METABOLIC PANEL
ALT: 23 U/L (ref 14–54)
AST: 20 U/L (ref 15–41)
Albumin: 3.4 g/dL — ABNORMAL LOW (ref 3.5–5.0)
Alkaline Phosphatase: 92 U/L (ref 38–126)
Anion gap: 8 (ref 5–15)
BILIRUBIN TOTAL: 1 mg/dL (ref 0.3–1.2)
BUN: 10 mg/dL (ref 6–20)
CALCIUM: 9.1 mg/dL (ref 8.9–10.3)
CHLORIDE: 94 mmol/L — AB (ref 101–111)
CO2: 32 mmol/L (ref 22–32)
CREATININE: 0.87 mg/dL (ref 0.44–1.00)
Glucose, Bld: 114 mg/dL — ABNORMAL HIGH (ref 65–99)
Potassium: 4.7 mmol/L (ref 3.5–5.1)
Sodium: 134 mmol/L — ABNORMAL LOW (ref 135–145)
Total Protein: 7.7 g/dL (ref 6.5–8.1)

## 2017-06-07 LAB — CBC WITH DIFFERENTIAL/PLATELET
Basophils Absolute: 0 10*3/uL (ref 0–0.1)
Basophils Relative: 0 %
EOS ABS: 0 10*3/uL (ref 0–0.7)
Eosinophils Relative: 0 %
HCT: 53.5 % — ABNORMAL HIGH (ref 35.0–47.0)
Hemoglobin: 16.5 g/dL — ABNORMAL HIGH (ref 12.0–16.0)
Lymphocytes Relative: 9 %
Lymphs Abs: 1.3 10*3/uL (ref 1.0–3.6)
MCH: 25.1 pg — AB (ref 26.0–34.0)
MCHC: 30.8 g/dL — AB (ref 32.0–36.0)
MCV: 81.6 fL (ref 80.0–100.0)
Monocytes Absolute: 0.9 10*3/uL (ref 0.2–0.9)
Monocytes Relative: 6 %
NEUTROS ABS: 12.4 10*3/uL — AB (ref 1.4–6.5)
NRBC: 2 /100{WBCs} — AB
Neutrophils Relative %: 85 %
PLATELETS: 255 10*3/uL (ref 150–440)
RBC: 6.56 MIL/uL — ABNORMAL HIGH (ref 3.80–5.20)
RDW: 17.8 % — ABNORMAL HIGH (ref 11.5–14.5)
WBC: 14.6 10*3/uL — ABNORMAL HIGH (ref 3.6–11.0)

## 2017-06-07 LAB — INFLUENZA PANEL BY PCR (TYPE A & B)
INFLAPCR: NEGATIVE
INFLBPCR: NEGATIVE

## 2017-06-07 LAB — BLOOD GAS, VENOUS
Acid-Base Excess: 6.3 mmol/L — ABNORMAL HIGH (ref 0.0–2.0)
Bicarbonate: 35.6 mmol/L — ABNORMAL HIGH (ref 20.0–28.0)
FIO2: 1
O2 SAT: 90.6 %
PATIENT TEMPERATURE: 37
PO2 VEN: 65 mmHg — AB (ref 32.0–45.0)
pCO2, Ven: 69 mmHg — ABNORMAL HIGH (ref 44.0–60.0)
pH, Ven: 7.32 (ref 7.250–7.430)

## 2017-06-07 LAB — LACTIC ACID, PLASMA: LACTIC ACID, VENOUS: 1.3 mmol/L (ref 0.5–1.9)

## 2017-06-07 LAB — TROPONIN I

## 2017-06-07 LAB — BRAIN NATRIURETIC PEPTIDE: B Natriuretic Peptide: 281 pg/mL — ABNORMAL HIGH (ref 0.0–100.0)

## 2017-06-07 MED ORDER — FUROSEMIDE 10 MG/ML IJ SOLN
20.0000 mg | Freq: Once | INTRAMUSCULAR | Status: AC
  Administered 2017-06-07: 20 mg via INTRAVENOUS

## 2017-06-07 MED ORDER — IPRATROPIUM-ALBUTEROL 0.5-2.5 (3) MG/3ML IN SOLN
3.0000 mL | Freq: Once | RESPIRATORY_TRACT | Status: AC
  Administered 2017-06-07: 3 mL via RESPIRATORY_TRACT
  Filled 2017-06-07: qty 3

## 2017-06-07 MED ORDER — CEFTRIAXONE SODIUM IN DEXTROSE 20 MG/ML IV SOLN
1.0000 g | INTRAVENOUS | Status: DC
  Filled 2017-06-07: qty 50

## 2017-06-07 MED ORDER — DEXTROSE 5 % IV SOLN
500.0000 mg | Freq: Once | INTRAVENOUS | Status: AC
  Administered 2017-06-07: 500 mg via INTRAVENOUS
  Filled 2017-06-07: qty 500

## 2017-06-07 MED ORDER — DEXTROSE 5 % IV SOLN
500.0000 mg | INTRAVENOUS | Status: DC
  Administered 2017-06-08 – 2017-06-11 (×4): 500 mg via INTRAVENOUS
  Filled 2017-06-07 (×4): qty 500

## 2017-06-07 MED ORDER — ONDANSETRON HCL 4 MG PO TABS: 4.0000 mg | ORAL_TABLET | Freq: Four times a day (QID) | ORAL | Status: DC | PRN

## 2017-06-07 MED ORDER — METHYLPREDNISOLONE SODIUM SUCC 125 MG IJ SOLR
125.0000 mg | INTRAMUSCULAR | Status: AC
  Administered 2017-06-07: 125 mg via INTRAVENOUS
  Filled 2017-06-07: qty 2

## 2017-06-07 MED ORDER — CHLORHEXIDINE GLUCONATE 0.12 % MT SOLN
15.0000 mL | Freq: Two times a day (BID) | OROMUCOSAL | Status: DC
  Administered 2017-06-07 – 2017-06-12 (×10): 15 mL via OROMUCOSAL
  Filled 2017-06-07 (×10): qty 15

## 2017-06-07 MED ORDER — IPRATROPIUM-ALBUTEROL 0.5-2.5 (3) MG/3ML IN SOLN
RESPIRATORY_TRACT | Status: AC
  Filled 2017-06-07: qty 3

## 2017-06-07 MED ORDER — ACETAMINOPHEN 650 MG RE SUPP: 650.0000 mg | Freq: Four times a day (QID) | RECTAL | Status: DC | PRN

## 2017-06-07 MED ORDER — AMLODIPINE BESYLATE 5 MG PO TABS
5.0000 mg | ORAL_TABLET | Freq: Every day | ORAL | Status: DC
  Administered 2017-06-09 – 2017-06-11 (×3): 5 mg via ORAL
  Filled 2017-06-07 (×4): qty 1

## 2017-06-07 MED ORDER — ASPIRIN EC 81 MG PO TBEC
81.0000 mg | DELAYED_RELEASE_TABLET | Freq: Every day | ORAL | Status: DC
  Administered 2017-06-08 – 2017-06-12 (×5): 81 mg via ORAL
  Filled 2017-06-07 (×5): qty 1

## 2017-06-07 MED ORDER — IPRATROPIUM-ALBUTEROL 0.5-2.5 (3) MG/3ML IN SOLN
3.0000 mL | Freq: Once | RESPIRATORY_TRACT | Status: AC
  Administered 2017-06-07: 3 mL via RESPIRATORY_TRACT

## 2017-06-07 MED ORDER — SODIUM CHLORIDE 0.9 % IV SOLN: 250.0000 mL | INTRAVENOUS | Status: DC | PRN

## 2017-06-07 MED ORDER — ALBUTEROL SULFATE (2.5 MG/3ML) 0.083% IN NEBU: 5.0000 mg | INHALATION_SOLUTION | Freq: Once | RESPIRATORY_TRACT | Status: DC

## 2017-06-07 MED ORDER — SODIUM CHLORIDE 0.9% FLUSH
3.0000 mL | Freq: Two times a day (BID) | INTRAVENOUS | Status: DC
  Administered 2017-06-07 – 2017-06-12 (×11): 3 mL via INTRAVENOUS

## 2017-06-07 MED ORDER — BISACODYL 5 MG PO TBEC: 5.0000 mg | DELAYED_RELEASE_TABLET | Freq: Every day | ORAL | Status: DC | PRN

## 2017-06-07 MED ORDER — ONDANSETRON HCL 4 MG/2ML IJ SOLN: 4.0000 mg | Freq: Four times a day (QID) | INTRAMUSCULAR | Status: DC | PRN

## 2017-06-07 MED ORDER — ORAL CARE MOUTH RINSE
15.0000 mL | Freq: Two times a day (BID) | OROMUCOSAL | Status: DC
  Administered 2017-06-09 – 2017-06-11 (×5): 15 mL via OROMUCOSAL

## 2017-06-07 MED ORDER — ACETAMINOPHEN 500 MG PO TABS
1000.0000 mg | ORAL_TABLET | ORAL | Status: AC
  Administered 2017-06-07: 1000 mg via ORAL
  Filled 2017-06-07: qty 2

## 2017-06-07 MED ORDER — METHYLPREDNISOLONE SODIUM SUCC 40 MG IJ SOLR
40.0000 mg | Freq: Two times a day (BID) | INTRAMUSCULAR | Status: DC
  Administered 2017-06-07 – 2017-06-10 (×7): 40 mg via INTRAVENOUS
  Filled 2017-06-07 (×7): qty 1

## 2017-06-07 MED ORDER — IPRATROPIUM-ALBUTEROL 0.5-2.5 (3) MG/3ML IN SOLN
3.0000 mL | RESPIRATORY_TRACT | Status: DC
  Administered 2017-06-07 – 2017-06-08 (×6): 3 mL via RESPIRATORY_TRACT
  Filled 2017-06-07 (×7): qty 3

## 2017-06-07 MED ORDER — CLONAZEPAM 0.5 MG PO TABS: 0.5000 mg | ORAL_TABLET | Freq: Three times a day (TID) | ORAL | Status: DC | PRN

## 2017-06-07 MED ORDER — DEXTROSE 5 % IV SOLN
INTRAVENOUS | Status: DC
  Administered 2017-06-08 – 2017-06-12 (×5): via INTRAVENOUS
  Filled 2017-06-07 (×5): qty 10

## 2017-06-07 MED ORDER — HEPARIN SODIUM (PORCINE) 5000 UNIT/ML IJ SOLN
5000.0000 [IU] | Freq: Three times a day (TID) | INTRAMUSCULAR | Status: DC
  Administered 2017-06-07 – 2017-06-08 (×2): 5000 [IU] via SUBCUTANEOUS
  Filled 2017-06-07 (×2): qty 1

## 2017-06-07 MED ORDER — FUROSEMIDE 10 MG/ML IJ SOLN
INTRAMUSCULAR | Status: AC
  Administered 2017-06-07: 20 mg via INTRAVENOUS
  Filled 2017-06-07: qty 4

## 2017-06-07 MED ORDER — MOMETASONE FURO-FORMOTEROL FUM 200-5 MCG/ACT IN AERO
2.0000 | INHALATION_SPRAY | Freq: Two times a day (BID) | RESPIRATORY_TRACT | Status: DC
  Administered 2017-06-07 – 2017-06-12 (×10): 2 via RESPIRATORY_TRACT
  Filled 2017-06-07: qty 8.8

## 2017-06-07 MED ORDER — CEFTRIAXONE SODIUM IN DEXTROSE 20 MG/ML IV SOLN
1.0000 g | Freq: Once | INTRAVENOUS | Status: AC
Start: 2017-06-07 — End: 2017-06-07
  Administered 2017-06-07: 1 g via INTRAVENOUS
  Filled 2017-06-07: qty 50

## 2017-06-07 MED ORDER — SENNOSIDES-DOCUSATE SODIUM 8.6-50 MG PO TABS
1.0000 | ORAL_TABLET | Freq: Every evening | ORAL | Status: DC | PRN
  Administered 2017-06-09: 1 via ORAL
  Filled 2017-06-07: qty 1

## 2017-06-07 MED ORDER — ACETAMINOPHEN 325 MG PO TABS
650.0000 mg | ORAL_TABLET | Freq: Four times a day (QID) | ORAL | Status: DC | PRN
  Administered 2017-06-08: 11:00:00 650 mg via ORAL
  Filled 2017-06-07: qty 2

## 2017-06-07 NOTE — ED Notes (Signed)
Pt trasnported to floor at this time, pt in NAD, pt on 6L [place], pt speaking in full sentences with no resp distress noted.

## 2017-06-07 NOTE — ED Notes (Signed)
Pt taken off non re breather per MD Mody at this time, pt placed on 6L [place] with no distress noted

## 2017-06-07 NOTE — H&P (Signed)
[facility] [place] [facility]   PATIENT NAME: [name]    MR#:  [id number]  DATE OF BIRTH:  1965/12/07  DATE OF ADMISSION:  06/07/2017  PRIMARY CARE PHYSICIAN: Cathlean Marseillesaaleman, Timothy P, MD   REQUESTING/REFERRING PHYSICIAN:  Dr Fanny Bienquale  CHIEF COMPLAINT:   Sob  HISTORY OF PRESENT ILLNESS:  Angel JohnSonya Weipert  is a 52 y.o. female with a known history of COPD and tobacco dependence who presents with above complaint. Patient with over the past several days she has had increasing shortness of breath, wheezing and cough with purulent sputum. Her daughter has noticed left lower extremity swelling. She denies PND, orthopnea. She denies chest pain. She denies fevers or chills.  Chest x-ray shows left lower lobe pneumonia When she came to the ER she had diffuse wheezing and was found to be hypoxic.  PAST MEDICAL HISTORY:   Past Medical History:  Diagnosis Date  . COPD (chronic obstructive pulmonary disease) (HCC)     PAST SURGICAL HISTORY:   Past Surgical History:  Procedure Laterality Date  . ABDOMINAL HYSTERECTOMY    . CHOLECYSTECTOMY    . HERNIA REPAIR      SOCIAL HISTORY:   Social History   Tobacco Use  . Smoking status: Current Every Day Smoker    Packs/day: 0.50    Types: Cigarettes  . Smokeless tobacco: Never Used  Substance Use Topics  . Alcohol use: No    FAMILY HISTORY:  No family history on file.  DRUG ALLERGIES:  No Known Allergies  REVIEW OF SYSTEMS:   Review of Systems  Constitutional: Negative.  Negative for chills, fever and malaise/fatigue.  HENT: Negative.  Negative for ear discharge, ear pain, hearing loss, nosebleeds and sore throat.   Eyes: Negative.  Negative for blurred vision and pain.  Respiratory: Positive for cough and shortness of breath. Negative for hemoptysis and wheezing.   Cardiovascular: Positive for leg swelling. Negative for chest pain and palpitations.  Gastrointestinal: Negative.  Negative for abdominal pain,  blood in stool, diarrhea, nausea and vomiting.  Genitourinary: Negative.  Negative for dysuria.  Musculoskeletal: Negative.  Negative for back pain.  Skin: Negative.   Neurological: Negative for dizziness, tremors, speech change, focal weakness, seizures and headaches.  Endo/Heme/Allergies: Negative.  Does not bruise/bleed easily.  Psychiatric/Behavioral: Negative.  Negative for depression, hallucinations and suicidal ideas.    MEDICATIONS AT HOME:   Prior to Admission medications   Medication Sig Start Date End Date Taking? Authorizing Provider  amitriptyline (ELAVIL) 10 MG tablet Take 10 mg by mouth at bedtime as needed.  10/13/16 10/13/17 Yes [provider]  amLODipine (NORVASC) 5 MG tablet Take 5 mg by mouth daily. 03/14/17 03/14/18 Yes [provider]  aspirin EC 81 MG tablet Take 81 mg by mouth daily. 05/01/17 01/08/31 Yes [provider]  clonazePAM (KLONOPIN) 0.5 MG tablet Take 0.5 mg by mouth 3 (three) times daily as needed.  07/31/16  Yes [provider]  cyclobenzaprine (FLEXERIL) 10 MG tablet Take 10 mg by mouth 3 (three) times daily as needed. 08/16/16  Yes [provider]  Fluticasone-Salmeterol (ADVAIR DISKUS) 250-50 MCG/DOSE AEPB Inhale 1 puff into the lungs 2 (two) times daily. 07/31/16  Yes [provider]  naproxen sodium (ALEVE) 220 MG tablet Take 220 mg by mouth daily as needed.   Yes [provider]  nitroGLYCERIN (NITROSTAT) 0.4 MG SL tablet Place 0.4 mg under the tongue as needed. 05/01/17 05/01/18 Yes [provider]  VITAL SIGNS:  Blood pressure 123/74, pulse 94, temperature 99.4 F (37.4 C), temperature source Oral, resp. rate (!) 23, height 5' (1.524 m), weight 122.5 kg (270 lb), SpO2 93 %.  PHYSICAL EXAMINATION:   Physical Exam  Constitutional: She is oriented to person, place, and time. No distress.  Morbid obesity  HENT:  Head: Normocephalic.  Eyes: No scleral icterus.  Neck: Normal  range of motion. Neck supple. No JVD present. No tracheal deviation present.  Cardiovascular: Normal rate, regular rhythm and normal heart sounds. Exam reveals no gallop and no friction rub.  No murmur heard. Pulmonary/Chest: Effort normal. No respiratory distress. She has wheezes. She has no rales. She exhibits no tenderness.  Abdominal: Soft. Bowel sounds are normal. She exhibits no distension and no mass. There is no tenderness. There is no rebound and no guarding.  Musculoskeletal: Normal range of motion. She exhibits edema (l left lower extremity edema).  Neurological: She is alert and oriented to person, place, and time.  Skin: Skin is warm. No rash noted. No erythema.  Psychiatric: Affect and judgment normal.      LABORATORY PANEL:   CBC Recent Labs  Lab 06/07/17 1055  WBC 14.6*  HGB 16.5*  HCT 53.5*  PLT 255   ------------------------------------------------------------------------------------------------------------------  Chemistries  Recent Labs  Lab 06/07/17 1055  NA 134*  K 4.7  CL 94*  CO2 32  GLUCOSE 114*  BUN 10  CREATININE 0.87  CALCIUM 9.1  AST 20  ALT 23  ALKPHOS 92  BILITOT 1.0   ------------------------------------------------------------------------------------------------------------------  Cardiac Enzymes Recent Labs  Lab 06/07/17 1055  TROPONINI <0.03   ------------------------------------------------------------------------------------------------------------------  RADIOLOGY:  Dg Chest Portable 1 View  Result Date: 06/07/2017 CLINICAL DATA:  Shortness of breath, body aches, cough and drowsiness for a few days. Smoker. EXAM: PORTABLE CHEST 1 VIEW COMPARISON:  CT chest and PA and lateral chest 09/29/2016. FINDINGS: There cardiomegaly and interstitial edema. Left pleural effusion and basilar airspace disease are seen. No pneumothorax. IMPRESSION: Cardiomegaly and interstitial edema. Left pleural effusion and basilar airspace disease  which could be due to atelectasis or pneumonia. Electronically Signed   By: Drusilla Kanner M.D.   On: 06/07/2017 11:43    EKG:   Sinus rhythm with right atrial enlargement and fascicular block  IMPRESSION AND PLAN:   52 year old female with history of tobacco dependence and COPD presented shortness of breath and wheezing.  1. Sepsis with Acute hypoxic respiratory failure in the setting of COPD exacerbation and community-acquired pneumonia Patient presents with leukocytosis, tachypnea and tachycardia Wean oxygen as tolerated  2. Acute COPD exacerbation: Start IV steroids Continue nebs and inhalers  3. Community-acquired pneumonia: Continue Rocephin and azithromycin as started in the ED  4. Tobacco dependence: Patient is encouraged to quit smoking. Counseling was provided for 4 minutes.   5. Lower extremity edema, left: Check venous Doppler to evaluate for underlying DVT I will also check echocardiogram to evaluate for underlying congestive heart failure   6. Morbid obesity: Patient encouraged to lose weight as tolerated. Patient would benefit from outpatient sleep evaluation to diagnose underlying sleep apnea.   All the records are reviewed and case discussed with ED provider. Management plans discussed with the patient and she is in agreement  CODE STATUS: Full  TOTAL TIME TAKING CARE OF THIS PATIENT: 41 minutes.    Jeron Grahn M.D on 06/07/2017 at 12:17 PM  Between 7am to 6pm - Pager - [phone]  After 6pm go to [email or web address] - password [email or web address]  [employer] [facility]  Office  [phone]  CC: Primary care physician; Cathlean Marseilles, MD

## 2017-06-07 NOTE — Progress Notes (Signed)
Pt could not tolerate the bipap so Dr. Fanny BienQuale gave me an order to put her on a NRB mask, which she tolerated. Sa02 is 95%.

## 2017-06-07 NOTE — ED Notes (Signed)
Pt assisted to bathroom at this time.

## 2017-06-07 NOTE — ED Provider Notes (Signed)
[facility] Emergency Department Provider Note ____________________________________________   First MD Initiated Contact with Patient 06/07/17 1011     (approximate)  I have reviewed the triage vital signs and the nursing notes.   HISTORY  Chief Complaint Shortness of Breath; Cough; Fatigue; and Facial Swelling  EM caveat: Patient presents with severe respiratory distress, hypoxia oxygen saturation less than 60%  HPI [name] is a 52 y.o. female presents for evaluation of shortness of breath and a cough  Patient reports she has been around multiple family members that had the flu over the last week.  2 days ago she began experiencing a cough fevers chills and shortness of breath.  Her daughter is with her reports that she is been confused and sleeping a lot the last day and a half.  She is also had some slight wheezing.  Patient denies pain.  No chest pain.  Does report shortness of breath that was severe but is somewhat better now that she is on oxygen.  She does have a history of COPD.  She did get a flu shot this year.  She continues to endorse mild shortness of breath and feeling very tired and "sleepy".  No recent hospitalizations.  Does not reside in a nursing home   Past Medical History:  Diagnosis Date  . COPD (chronic obstructive pulmonary disease) [facility])     Patient Active Problem List   Diagnosis Date Noted  . PNA (pneumonia) 06/07/2017    Past Surgical History:  Procedure Laterality Date  . ABDOMINAL HYSTERECTOMY    . CHOLECYSTECTOMY    . HERNIA REPAIR      Prior to Admission medications   Medication Sig Start Date End Date Taking? Authorizing Provider  amitriptyline (ELAVIL) 10 MG tablet Take 10 mg by mouth at bedtime as needed.  10/13/16 10/13/17 Yes [provider]  amLODipine (NORVASC) 5 MG tablet Take 5 mg by mouth daily. 03/14/17 03/14/18 Yes [provider]  aspirin EC 81 MG tablet Take 81 mg by mouth  daily. 05/01/17 01/08/31 Yes [provider]  clonazePAM (KLONOPIN) 0.5 MG tablet Take 0.5 mg by mouth 3 (three) times daily as needed.  07/31/16  Yes [provider]  cyclobenzaprine (FLEXERIL) 10 MG tablet Take 10 mg by mouth 3 (three) times daily as needed. 08/16/16  Yes [provider]  Fluticasone-Salmeterol (ADVAIR DISKUS) 250-50 MCG/DOSE AEPB Inhale 1 puff into the lungs 2 (two) times daily. 07/31/16  Yes [provider]  naproxen sodium (ALEVE) 220 MG tablet Take 220 mg by mouth daily as needed.   Yes [provider]  nitroGLYCERIN (NITROSTAT) 0.4 MG SL tablet Place 0.4 mg under the tongue as needed. 05/01/17 05/01/18 Yes [provider]    Allergies Patient has no known allergies.  No family history on file.  Social History Social History   Tobacco Use  . Smoking status: Current Every Day Smoker    Packs/day: 0.50    Types: Cigarettes  . Smokeless tobacco: Never Used  Substance Use Topics  . Alcohol use: No  . Drug use: Not on file    Review of Systems Constitutional: Fevers chills fatigue and sleepiness Eyes: No visual changes. ENT: No sore throat. Cardiovascular: Denies chest pain. Respiratory: See HPI Gastrointestinal: No abdominal pain.  No nausea, no vomiting.  No diarrhea.  No constipation. Genitourinary: Negative for dysuria. Musculoskeletal: Negative for back pain.  She does report she seen some swelling around her ankles and feet the last  few days. Skin: Negative for rash. Neurological: Negative for headaches, focal weakness or numbness.  ____________________________________________   PHYSICAL EXAM:  VITAL SIGNS: ED Triage Vitals  Enc Vitals Group     BP 06/07/17 0955 123/74     Pulse Rate 06/07/17 0955 (!) 110     Resp 06/07/17 0955 (!) 22     Temp 06/07/17 0955 99.4 F (37.4 C)     Temp Source 06/07/17 0955 Oral     SpO2 06/07/17 0955 (!) 51 %     Weight 06/07/17 0940 270 lb (122.5 kg)      Height 06/07/17 0940 5' (1.524 m)     Head Circumference --      Peak Flow --      Pain Score 06/07/17 0938 7     Pain Loc --      Pain Edu? --      Excl. in GC? --     Constitutional: Alert and oriented.  Moderate increased work of breathing.  Some somnolence off and on when not being spoken to, but when spoken to she is alert and well-oriented x3. Eyes: Conjunctivae are normal. Head: Atraumatic. Nose: No congestion/rhinnorhea. Mouth/Throat: Mucous membranes are moist. Neck: No stridor.   Cardiovascular: Mild tachycardic rate, regular rhythm. Grossly normal heart sounds.  Good peripheral circulation. Respiratory: Mild to moderate tachypnea, end expiratory wheezing noted throughout.  No focal lung sounds crackles, but the patient is rather obese and sounds are distant throughout.  Mild use of accessory muscles and speaks in phrases. Gastrointestinal: Soft and nontender. No distention. Musculoskeletal: No lower extremity tenderness does have some slight trace edema at the feet bilaterally but no notable edema tracking up the leg on either side.  No thigh tenderness. Neurologic:  Normal speech and language. No gross focal neurologic deficits are appreciated.  Skin:  Skin is warm, dry and intact. No rash noted. Psychiatric: Mood and affect are normal. Speech and behavior are normal.  ____________________________________________   LABS (all labs ordered are listed, but only abnormal results are displayed)  Labs Reviewed  CBC WITH DIFFERENTIAL/PLATELET - Abnormal; Notable for the following components:      Result Value   WBC 14.6 (*)    RBC 6.56 (*)    Hemoglobin 16.5 (*)    HCT 53.5 (*)    MCH 25.1 (*)    MCHC 30.8 (*)    RDW 17.8 (*)    nRBC 2 (*)    Neutro Abs 12.4 (*)    All other components within normal limits  BLOOD GAS, VENOUS - Abnormal; Notable for the following components:   pCO2, Ven 69 (*)    pO2, Ven 65.0 (*)    Bicarbonate 35.6 (*)    Acid-Base Excess 6.3 (*)      All other components within normal limits  BRAIN NATRIURETIC PEPTIDE - Abnormal; Notable for the following components:   B Natriuretic Peptide 281.0 (*)    All other components within normal limits  COMPREHENSIVE METABOLIC PANEL - Abnormal; Notable for the following components:   Sodium 134 (*)    Chloride 94 (*)    Glucose, Bld 114 (*)    Albumin 3.4 (*)    All other components within normal limits  CULTURE, BLOOD (ROUTINE X 2)  CULTURE, BLOOD (ROUTINE X 2)  INFLUENZA PANEL BY PCR (TYPE A & B)  LACTIC ACID, PLASMA  TROPONIN I   ____________________________________________  EKG  Reviewed interrupt me at 10:35 AM Heart rate 95 Cures 80 QTC  450 Normal sinus rhythm, T wave inversions noted in aVL, nonspecific T wave abnormality in V2 Compared to previous, appears similar in appearance.  No evidence of any acute change noted on today's EKG versus prior ____________________________________________  RADIOLOGY  Dg Chest Portable 1 View  Result Date: 06/07/2017 CLINICAL DATA:  Shortness of breath, body aches, cough and drowsiness for a few days. Smoker. EXAM: PORTABLE CHEST 1 VIEW COMPARISON:  CT chest and PA and lateral chest 09/29/2016. FINDINGS: There cardiomegaly and interstitial edema. Left pleural effusion and basilar airspace disease are seen. No pneumothorax. IMPRESSION: Cardiomegaly and interstitial edema. Left pleural effusion and basilar airspace disease which could be due to atelectasis or pneumonia. Electronically Signed   By: Drusilla Kanner M.D.   On: 06/07/2017 11:43   Chest x-ray reviewed by me, cardiomegaly interstitial edema, possible airspace disease as well ____________________________________________   PROCEDURES  Procedure(s) performed: None  Procedures  Critical Care performed: Yes, see critical care note(s)  CRITICAL CARE Performed by: Sharyn Creamer   Total critical care time: 35 minutes  Critical care time was exclusive of separately billable  procedures and treating other patients.  Critical care was necessary to treat or prevent imminent or life-threatening deterioration.  Critical care was time spent personally by me on the following activities: development of treatment plan with patient and/or surrogate as well as nursing, discussions with consultants, evaluation of patient's response to treatment, examination of patient, obtaining history from patient or surrogate, ordering and performing treatments and interventions, ordering and review of laboratory studies, ordering and review of radiographic studies, pulse oximetry and re-evaluation of patient's condition.  Severe hypoxia on room air (< 80%), placed on BiPAP. At risk for respiratory decompensation.  ____________________________________________   INITIAL IMPRESSION / ASSESSMENT AND PLAN / ED COURSE  Pertinent labs & imaging results that were available during my care of the patient were reviewed by me and considered in my medical decision making (see chart for details).  Patient presents for evaluation of cough, low-grade fever and recent exposure to multiple people with upper respiratory infection possibly the flu.  She now reports productive cough increased shortness of breath.  She does have a history of COPD is notably wheezing with increased work of breathing.  Clinical Course as of Jun 07 1238  Thu Jun 07, 2017  1103 IV team working on access. Labs drawn. Very difficult IV access. Patient remains alert, Report feeling quite anxious with BiPAP  [MQ]  1145 Patient off BiPAP, presently resting.  Alert oriented speaking with family.  Appears improved.  [MQ]    Clinical Course User Index [MQ] Sharyn Creamer, MD  ----------------------------------------- 12:38 PM on 06/07/2017 -----------------------------------------  Patient has been taken off BiPAP, alert oriented.  Respirations improved.  Chest x-ray concerning for mixed process possibly some pulmonary edema  interstitial edema, cardiomegaly and concern for possible associated pneumonia as well.  Flu test is negative by PCR  Ongoing care by the hospitalist service, hypoxia corrects with oxygenation, work of breathing improving, will be admitted for further workup including echo to further evaluate for potential edema or congestive heart failure in the setting as well   ____________________________________________   FINAL CLINICAL IMPRESSION(S) / ED DIAGNOSES  Final diagnoses:  Community acquired pneumonia of left lower lobe of lung (HCC)  Hypoxia  Edema, unspecified type      NEW MEDICATIONS STARTED DURING THIS VISIT:  New Prescriptions   No medications on file     Note:  This document was prepared using Dragon voice  recognition software and may include unintentional dictation errors.     Sharyn Creamer, MD 06/07/17 1240

## 2017-06-07 NOTE — ED Notes (Signed)
Pt tolerating 6L [place] around 86-91% , pt denies any SOB, NAD noted ,hx of COPD

## 2017-06-07 NOTE — ED Triage Notes (Signed)
Pt here with c/o body aches, shob, cough and drowsiness for the past few days. Appears in no distress, has been in contact with the flu.

## 2017-06-07 NOTE — Progress Notes (Signed)
ANTIBIOTIC CONSULT NOTE - INITIAL  Pharmacy Consult for azithromycin and ceftriaxone Indication: CAP  No Known Allergies  Patient Measurements: Height: 5' (152.4 cm) Weight: 270 lb (122.5 kg) IBW/kg (Calculated) : 45.5 Adjusted Body Weight:   Vital Signs: Temp: 99.4 F (37.4 C) (01/24 0955) Temp Source: Oral (01/24 0955) BP: 123/74 (01/24 0955) Pulse Rate: 94 (01/24 1213) Intake/Output from previous day: No intake/output data recorded. Intake/Output from this shift: No intake/output data recorded.  Labs: Recent Labs    06/07/17 1055  WBC 14.6*  HGB 16.5*  PLT 255  CREATININE 0.87   Estimated Creatinine Clearance: 90.1 mL/min (by C-G formula based on SCr of 0.87 mg/dL). No results for input(s): VANCOTROUGH, VANCOPEAK, VANCORANDOM, GENTTROUGH, GENTPEAK, GENTRANDOM, TOBRATROUGH, TOBRAPEAK, TOBRARND, AMIKACINPEAK, AMIKACINTROU, AMIKACIN in the last 72 hours.   Microbiology: No results found for this or any previous visit (from the past 720 hour(s)).  Medical History: Past Medical History:  Diagnosis Date  . COPD (chronic obstructive pulmonary disease) (HCC)     Medications:  Infusions:  . azithromycin    . [START ON 06/08/2017] azithromycin    . cefTRIAXone (ROCEPHIN)  IV 1 g (06/07/17 1209)  . [START ON 06/08/2017] cefTRIAXone (ROCEPHIN)  IV     Assessment: 51 yof cc SOB and PMH COPD. Meets sepsis criteria with leukocytosis, tachypnea, and tachycardia. Treatment for CAP initiated with IV steroids, nebs, inhalers, and antibiotics. Pharmacy consulted to dose azithromycin and ceftriaxone for CAP.  Goal of Therapy:  Resolve infection Prevent ADE  Plan:  1. Azithromycin 500 mg IV Q24H 2. Ceftriaxone 1 gm IV Q24H  Carola FrostNathan A Sameul Tagle, Pharm.D., BCPS Clinical Pharmacist 06/07/2017,12:31 PM

## 2017-06-08 ENCOUNTER — Inpatient Hospital Stay
Admit: 2017-06-08 | Discharge: 2017-06-08 | Disposition: A | Discharge status: Home / Self Care | Payer: Medicaid Other | Attending: Internal Medicine | Admitting: Internal Medicine

## 2017-06-08 DIAGNOSIS — R0602 Shortness of breath: Secondary | ICD-10-CM

## 2017-06-08 DIAGNOSIS — R259 Unspecified abnormal involuntary movements: Secondary | ICD-10-CM

## 2017-06-08 LAB — BASIC METABOLIC PANEL
Anion gap: 10 (ref 5–15)
BUN: 11 mg/dL (ref 6–20)
CHLORIDE: 94 mmol/L — AB (ref 101–111)
CO2: 31 mmol/L (ref 22–32)
CREATININE: 0.74 mg/dL (ref 0.44–1.00)
Calcium: 8.8 mg/dL — ABNORMAL LOW (ref 8.9–10.3)
GFR calc Af Amer: >60 mL/min (ref >60)
GFR calc non Af Amer: >60 mL/min (ref >60)
Glucose, Bld: 197 mg/dL — ABNORMAL HIGH (ref 65–99)
Potassium: 4.9 mmol/L (ref 3.5–5.1)
Sodium: 135 mmol/L (ref 135–145)

## 2017-06-08 LAB — CBC
HCT: 53.6 % — ABNORMAL HIGH (ref 35.0–47.0)
Hemoglobin: 16.4 g/dL — ABNORMAL HIGH (ref 12.0–16.0)
MCH: 25.2 pg — AB (ref 26.0–34.0)
MCHC: 30.6 g/dL — ABNORMAL LOW (ref 32.0–36.0)
MCV: 82.3 fL (ref 80.0–100.0)
PLATELETS: 221 10*3/uL (ref 150–440)
RBC: 6.51 MIL/uL — ABNORMAL HIGH (ref 3.80–5.20)
RDW: 17.6 % — AB (ref 11.5–14.5)
WBC: 17.2 10*3/uL — ABNORMAL HIGH (ref 3.6–11.0)

## 2017-06-08 LAB — ECHOCARDIOGRAM COMPLETE
HEIGHTINCHES: 60 in
WEIGHTICAEL: 4320 [oz_av]

## 2017-06-08 MED ORDER — OXYCODONE-ACETAMINOPHEN 5-325 MG PO TABS
1.0000 | ORAL_TABLET | Freq: Once | ORAL | Status: AC
  Administered 2017-06-08: 1 via ORAL
  Filled 2017-06-08: qty 1

## 2017-06-08 MED ORDER — PERFLUTREN LIPID MICROSPHERE
1.0000 mL | INTRAVENOUS | Status: AC | PRN
  Administered 2017-06-08: 2 mL via INTRAVENOUS
  Filled 2017-06-08: qty 10

## 2017-06-08 MED ORDER — IPRATROPIUM-ALBUTEROL 0.5-2.5 (3) MG/3ML IN SOLN
3.0000 mL | Freq: Four times a day (QID) | RESPIRATORY_TRACT | Status: DC
  Administered 2017-06-08 – 2017-06-11 (×10): 3 mL via RESPIRATORY_TRACT
  Filled 2017-06-08 (×10): qty 3

## 2017-06-08 MED ORDER — GUAIFENESIN-DM 100-10 MG/5ML PO SYRP
5.0000 mL | ORAL_SOLUTION | ORAL | Status: DC | PRN
  Administered 2017-06-08 – 2017-06-10 (×7): 5 mL via ORAL
  Filled 2017-06-08 (×9): qty 5

## 2017-06-08 MED ORDER — ENOXAPARIN SODIUM 40 MG/0.4ML ~~LOC~~ SOLN
40.0000 mg | Freq: Two times a day (BID) | SUBCUTANEOUS | Status: DC
  Administered 2017-06-08 – 2017-06-12 (×8): 40 mg via SUBCUTANEOUS
  Filled 2017-06-08 (×8): qty 0.4

## 2017-06-08 NOTE — Progress Notes (Signed)
ANTIBIOTIC CONSULT NOTE - FOLLOW UP  Pharmacy Consult for azithromycin and ceftriaxone Indication: CAP  No Known Allergies  Patient Measurements: Height: 5' (152.4 cm) Weight: 270 lb (122.5 kg) IBW/kg (Calculated) : 45.5  Vital Signs: Temp: 97.9 F (36.6 C) (01/25 0457) Temp Source: Axillary (01/25 0457) BP: 95/44 (01/25 0457) Pulse Rate: 87 (01/25 0457) Intake/Output from previous day: 01/24 0701 - 01/25 0700 In: 0  Out: 600 [Urine:600] Intake/Output from this shift: No intake/output data recorded.  Labs: Recent Labs    06/07/17 1055 06/08/17 0554  WBC 14.6* 17.2*  HGB 16.5* 16.4*  PLT 255 221  CREATININE 0.87 0.74   Estimated Creatinine Clearance: 98 mL/min (by C-G formula based on SCr of 0.74 mg/dL).  Medical History: Past Medical History:  Diagnosis Date  . COPD (chronic obstructive pulmonary disease) (HCC)     Medications:  Infusions:  . sodium chloride    . azithromycin    . small volume/piggyback builder     Assessment: Pharmacy consulted to dose ceftriaxone and azithromycin for CAP.   Plan:  Continue ceftriaxone 1 g IV daily and azithromycin 500 mg IV daily.   Cindi CarbonMary M Shanikia Kernodle, Pharm.D., BCPS Clinical Pharmacist 06/08/2017,7:43 AM

## 2017-06-08 NOTE — Progress Notes (Signed)
[facility] -  at [facility]   PATIENT NAME: [name]    MR#:  [id number]  DATE OF BIRTH:  10-Dec-1965  SUBJECTIVE:  CHIEF COMPLAINT: Patient shortness of breath is better but reporting intermittent episodes of cough and on 6 L of oxygen via nasal cannula.  Not on home oxygen.  Daughter at bedside.  REVIEW OF SYSTEMS:  CONSTITUTIONAL: No fever, fatigue or weakness.  EYES: No blurred or double vision.  EARS, NOSE, AND THROAT: No tinnitus or ear pain.  RESPIRATORY: Reporting intermittent episodes of cough and improving shortness of breath, denies wheezing or hemoptysis.  CARDIOVASCULAR: No chest pain, orthopnea, edema.  GASTROINTESTINAL: No nausea, vomiting, diarrhea or abdominal pain.  GENITOURINARY: No dysuria, hematuria.  ENDOCRINE: No polyuria, nocturia,  HEMATOLOGY: No anemia, easy bruising or bleeding SKIN: No rash or lesion. MUSCULOSKELETAL: No joint pain or arthritis.   NEUROLOGIC: No tingling, numbness, weakness.  PSYCHIATRY: No anxiety or depression.   DRUG ALLERGIES:  No Known Allergies  VITALS:  Blood pressure 124/63, pulse 93, temperature (!) 97.3 F (36.3 C), temperature source Oral, resp. rate 20, height 5' (1.524 m), weight 122.5 kg (270 lb), SpO2 90 %.  PHYSICAL EXAMINATION:  GENERAL:  52 y.o.-year-old patient lying in the bed with no acute distress.  EYES: Pupils equal, round, reactive to light and accommodation. No scleral icterus. Extraocular muscles intact.  HEENT: Head atraumatic, normocephalic. Oropharynx and nasopharynx clear.  NECK:  Supple, no jugular venous distention. No thyroid enlargement, no tenderness.  LUNGS: Moderate breath sounds bilaterally, no wheezing, rales,rhonchi or crepitation. No use of accessory muscles of respiration.  CARDIOVASCULAR: S1, S2 normal. No murmurs, rubs, or gallops.  ABDOMEN: Soft, nontender, nondistended. Bowel sounds present. No organomegaly or mass.  EXTREMITIES: No pedal edema,  cyanosis, or clubbing.  NEUROLOGIC: Cranial nerves II through XII are intact. Muscle strength 5/5 in all extremities. Sensation intact. Gait not checked.  PSYCHIATRIC: The patient is alert and oriented x 3.  SKIN: No obvious rash, lesion, or ulcer.    LABORATORY PANEL:   CBC Recent Labs  Lab 06/08/17 0554  WBC 17.2*  HGB 16.4*  HCT 53.6*  PLT 221   ------------------------------------------------------------------------------------------------------------------  Chemistries  Recent Labs  Lab 06/07/17 1055 06/08/17 0554  NA 134* 135  K 4.7 4.9  CL 94* 94*  CO2 32 31  GLUCOSE 114* 197*  BUN 10 11  CREATININE 0.87 0.74  CALCIUM 9.1 8.8*  AST 20  --   ALT 23  --   ALKPHOS 92  --   BILITOT 1.0  --    ------------------------------------------------------------------------------------------------------------------  Cardiac Enzymes Recent Labs  Lab 06/07/17 1055  TROPONINI <0.03   ------------------------------------------------------------------------------------------------------------------  RADIOLOGY:  US Venous Img Lower Unilateral Left  Result Date: 06/07/2017 CLINICAL DATA:  Left lower extremity edema. EXAM: LEFT LOWER EXTREMITY VENOUS DOPPLER ULTRASOUND TECHNIQUE: Gray-scale sonography with graded compression, as well as color Doppler and duplex ultrasound were performed to evaluate the lower extremity deep venous systems from the level of the common femoral vein and including the common femoral, femoral, profunda femoral, popliteal and calf veins including the posterior tibial, peroneal and gastrocnemius veins when visible. The superficial great saphenous vein was also interrogated. Spectral Doppler was utilized to evaluate flow at rest and with distal augmentation maneuvers in the common femoral, femoral and popliteal veins. COMPARISON:  09/10/2013 FINDINGS: Contralateral Common Femoral Vein: Respiratory phasicity is normal and symmetric with the symptomatic side.  No evidence of thrombus. Normal compressibility. Common Femoral Vein: No  evidence of thrombus. Normal compressibility, respiratory phasicity and response to augmentation. Saphenofemoral Junction: No evidence of thrombus. Normal compressibility and flow on color Doppler imaging. Profunda Femoral Vein: No evidence of thrombus. Normal compressibility and flow on color Doppler imaging. Femoral Vein: No evidence of thrombus. Normal compressibility, respiratory phasicity and response to augmentation. Popliteal Vein: No evidence of thrombus. Normal compressibility, respiratory phasicity and response to augmentation. Calf Veins: No evidence of thrombus. Normal compressibility and flow on color Doppler imaging. Superficial Great Saphenous Vein: No evidence of thrombus. Normal compressibility. Venous Reflux:  None. Other Findings: No evidence of superficial thrombophlebitis or abnormal fluid collection. IMPRESSION: No evidence of left lower extremity deep venous thrombosis. Electronically Signed   By: Irish LackGlenn  Yamagata M.D.   On: 06/07/2017 17:12   Dg Chest Portable 1 View  Result Date: 06/07/2017 CLINICAL DATA:  Shortness of breath, body aches, cough and drowsiness for a few days. Smoker. EXAM: PORTABLE CHEST 1 VIEW COMPARISON:  CT chest and PA and lateral chest 09/29/2016. FINDINGS: There cardiomegaly and interstitial edema. Left pleural effusion and basilar airspace disease are seen. No pneumothorax. IMPRESSION: Cardiomegaly and interstitial edema. Left pleural effusion and basilar airspace disease which could be due to atelectasis or pneumonia. Electronically Signed   By: Drusilla Kannerhomas  Dalessio M.D.   On: 06/07/2017 11:43    EKG:   Orders placed or performed during the hospital encounter of 06/07/17  . ED EKG  . ED EKG  . EKG 12-Lead  . EKG 12-Lead  . EKG    ASSESSMENT AND PLAN:    52 year old female with history of tobacco dependence and COPD presented shortness of breath and wheezing.  1. Sepsis with  Acute hypoxic respiratory failure in the setting of COPD exacerbation and community-acquired pneumonia Patient presents with leukocytosis, tachypnea and tachycardia Wean oxygen as tolerated, currently on 6 L of oxygen  2. Acute COPD exacerbation:  IV steroids tapering as clinically permits Continue nebs and inhalers  3. Community-acquired pneumonia: Continue Rocephin and azithromycin   4. Tobacco dependence: Patient is encouraged to quit smoking. Counseling was provided for 4 minutes.   5. Lower extremity edema, left: LLE venous Doppler - NEG DVT check echocardiogram to evaluate for underlying congestive heart failure   6. Morbid obesity: Patient encouraged to lose weight as tolerated. Patient would benefit from outpatient sleep evaluation to diagnose underlying sleep apnea.  7.  Myoclonic jerks versus twitching-neurology consulted      All the records are reviewed and case discussed with Care Management/Social Workerr. Management plans discussed with the patient, family and they are in agreement.  CODE STATUS: fc   TOTAL TIME TAKING CARE OF THIS PATIENT: 36 minutes.   POSSIBLE D/C IN 1-2  DAYS, DEPENDING ON CLINICAL CONDITION.  Note: This dictation was prepared with Dragon dictation along with smaller phrase technology. Any transcriptional errors that result from this process are unintentional.   Ramonita LabAruna Arty Lantzy M.D on 06/08/2017 at 4:14 PM  Between 7am to 6pm - Pager - [phone] After 6pm go to [email or web address] - password [email or web address] [facility]  [facility]  Office  [phone]  CC: Primary care physician; Cathlean Marseillesaaleman, Timothy P, MD

## 2017-06-08 NOTE — Progress Notes (Signed)
*  PRELIMINARY RESULTS* Echocardiogram 2D Echocardiogram has been performed.  Joanette GulaJoan M Safiya Girdler 06/08/2017, 2:40 PM

## 2017-06-08 NOTE — Consult Note (Signed)
Reason for Consult:Twitching Referring Physician: Gouru  CC: Twitching  HPI: Angel MeekerSonya D Murdy is an 52 y.o. female admitted with PNA and COPD exacerbation who reports that she has had a twitch for about a year.  She describes it as involuntary jerking that often makes  Her drop things.  For the most part she has ignored it.  Feels that it has been somewhat worse since November.  Her daughter began to notice it during this hospitalization and felt it should be evaluated.    Past Medical History:  Diagnosis Date  . COPD (chronic obstructive pulmonary disease) (HCC)     Past Surgical History:  Procedure Laterality Date  . ABDOMINAL HYSTERECTOMY    . CHOLECYSTECTOMY    . HERNIA REPAIR      Family history: Father with a history of arthritis, COPD, DM, HTN and CAD.  Mother with CAD and HTN.    Social History:  reports that she has been smoking cigarettes.  She has been smoking about 0.50 packs per day. she has never used smokeless tobacco. She reports that she does not drink alcohol. Her drug history is not on file.  No Known Allergies  Medications:  I have reviewed the patient's current medications. Prior to Admission:  Medications Prior to Admission  Medication Sig Dispense Refill Last Dose  . amitriptyline (ELAVIL) 10 MG tablet Take 10 mg by mouth at bedtime as needed.    prn at prn  . amLODipine (NORVASC) 5 MG tablet Take 5 mg by mouth daily.   06/07/2017 at 0930  . aspirin EC 81 MG tablet Take 81 mg by mouth daily.   06/07/2017 at 0930  . clonazePAM (KLONOPIN) 0.5 MG tablet Take 0.5 mg by mouth 3 (three) times daily as needed.    Past Month at Unknown time  . cyclobenzaprine (FLEXERIL) 10 MG tablet Take 10 mg by mouth 3 (three) times daily as needed.   Past Week at Unknown time  . Fluticasone-Salmeterol (ADVAIR DISKUS) 250-50 MCG/DOSE AEPB Inhale 1 puff into the lungs 2 (two) times daily.   prn at prn  . naproxen sodium (ALEVE) 220 MG tablet Take 220 mg by mouth daily as needed.   prn  at prn  . nitroGLYCERIN (NITROSTAT) 0.4 MG SL tablet Place 0.4 mg under the tongue as needed.   prn at prn   Scheduled: . amLODipine  5 mg Oral Daily  . aspirin EC  81 mg Oral Daily  . chlorhexidine  15 mL Mouth Rinse BID  . enoxaparin (LOVENOX) injection  40 mg Subcutaneous Q12H  . ipratropium-albuterol  3 mL Nebulization QID  . mouth rinse  15 mL Mouth Rinse q12n4p  . methylPREDNISolone (SOLU-MEDROL) injection  40 mg Intravenous Q12H  . mometasone-formoterol  2 puff Inhalation BID  . sodium chloride flush  3 mL Intravenous Q12H    ROS: History obtained from the patient  General ROS: negative for - chills, fatigue, fever, night sweats, weight gain or weight loss Psychological ROS: negative for - behavioral disorder, hallucinations, memory difficulties, mood swings or suicidal ideation Ophthalmic ROS: negative for - blurry vision, double vision, eye pain or loss of vision ENT ROS: negative for - epistaxis, nasal discharge, oral lesions, sore throat, tinnitus or vertigo Allergy and Immunology ROS: negative for - hives or itchy/watery eyes Hematological and Lymphatic ROS: negative for - bleeding problems, bruising or swollen lymph nodes Endocrine ROS: negative for - galactorrhea, hair pattern changes, polydipsia/polyuria or temperature intolerance Respiratory ROS: cough, shortness of breath, wheezing  Cardiovascular ROS: negative for - chest pain, dyspnea on exertion, edema or irregular heartbeat Gastrointestinal ROS: negative for - abdominal pain, diarrhea, hematemesis, nausea/vomiting or stool incontinence Genito-Urinary ROS: negative for - dysuria, hematuria, incontinence or urinary frequency/urgency Musculoskeletal ROS: negative for - joint swelling or muscular weakness Neurological ROS: as noted in HPI Dermatological ROS: negative for rash and skin lesion changes  Physical Examination: Blood pressure 103/62, pulse 94, temperature 97.9 F (36.6 C), temperature source Axillary,  resp. rate 20, height 5' (1.524 m), weight 122.5 kg (270 lb), SpO2 (!) 87 %.  HEENT-  Normocephalic, no lesions, without obvious abnormality.  Normal external eye and conjunctiva.  Normal TM's bilaterally.  Normal auditory canals and external ears. Normal external nose, mucus membranes and septum.  Normal pharynx. Cardiovascular- S1, S2 normal, pulses palpable throughout   Lungs- wheezes noted Abdomen- soft, non-tender; bowel sounds normal; no masses,  no organomegaly Extremities- no edema Lymph-no adenopathy palpable Musculoskeletal-no joint tenderness, deformity or swelling Skin-warm and dry, no hyperpigmentation, vitiligo, or suspicious lesions  Neurological Examination   Mental Status: Alert, oriented, thought content appropriate.  Speech fluent without evidence of aphasia.  Able to follow 3 step commands without difficulty. Cranial Nerves: II: Discs flat bilaterally; Visual fields grossly normal, pupils equal, round, reactive to light and accommodation III,IV, VI: ptosis not present, extra-ocular motions intact bilaterally V,VII: smile symmetric, facial light touch sensation normal bilaterally VIII: hearing normal bilaterally IX,X: gag reflex present XI: bilateral shoulder shrug XII: midline tongue extension Motor: Right : Upper extremity   5/5    Left:     Upper extremity   5/5  Lower extremity   5/5     Lower extremity   5/5 Tone and bulk:normal tone throughout; no atrophy noted.  Occasional twitch of a finger noted. Sensory: Pinprick and light touch intact throughout, bilaterally Deep Tendon Reflexes: 2+ and symmetric throughout Plantars: Right: mute   Left: mute Cerebellar: Normal finger-to-nose and normal heel-to-shin testing bilaterally Gait: not tested due to safety concerns   Laboratory Studies:   Basic Metabolic Panel: Recent Labs  Lab 06/07/17 1055 06/08/17 0554  NA 134* 135  K 4.7 4.9  CL 94* 94*  CO2 32 31  GLUCOSE 114* 197*  BUN 10 11  CREATININE 0.87  0.74  CALCIUM 9.1 8.8*    Liver Function Tests: Recent Labs  Lab 06/07/17 1055  AST 20  ALT 23  ALKPHOS 92  BILITOT 1.0  PROT 7.7  ALBUMIN 3.4*   No results for input(s): LIPASE, AMYLASE in the last 168 hours. No results for input(s): AMMONIA in the last 168 hours.  CBC: Recent Labs  Lab 06/07/17 1055 06/08/17 0554  WBC 14.6* 17.2*  NEUTROABS 12.4*  --   HGB 16.5* 16.4*  HCT 53.5* 53.6*  MCV 81.6 82.3  PLT 255 221    Cardiac Enzymes: Recent Labs  Lab 06/07/17 1055  TROPONINI <0.03    BNP: Invalid input(s): POCBNP  CBG: No results for input(s): GLUCAP in the last 168 hours.  Microbiology: Results for orders placed or performed during the hospital encounter of 06/07/17  Blood Culture (routine x 2)     Status: None (Preliminary result)   Collection Time: 06/07/17 10:31 AM  Result Value Ref Range Status   Specimen Description BLOOD RIGHT HAND  Final   Special Requests   Final    BOTTLES DRAWN AEROBIC AND ANAEROBIC Blood Culture adequate volume   Culture   Final    NO GROWTH < 24 HOURS Performed  at [facility], [address] [place], [place] [address]    Report Status PENDING  Incomplete  Blood Culture (routine x 2)     Status: None (Preliminary result)   Collection Time: 06/07/17 11:08 AM  Result Value Ref Range Status   Specimen Description BLOOD LEFT HAND  Final   Special Requests   Final    BOTTLES DRAWN AEROBIC AND ANAEROBIC Blood Culture adequate volume   Culture   Final    NO GROWTH < 24 HOURS Performed at [facility], [address] [place], [place] [address]    Report Status PENDING  Incomplete    Coagulation Studies: No results for input(s): LABPROT, INR in the last 72 hours.  Urinalysis: No results for input(s): COLORURINE, LABSPEC, PHURINE, GLUCOSEU, HGBUR, BILIRUBINUR, KETONESUR, PROTEINUR, UROBILINOGEN, NITRITE, LEUKOCYTESUR in the last 168 hours.  Invalid input(s): APPERANCEUR  Lipid Panel:  No results  found for: CHOL, TRIG, HDL, CHOLHDL, VLDL, LDLCALC  HgbA1C:  Lab Results  Component Value Date   HGBA1C 6.0 01/27/2012    Urine Drug Screen:  No results found for: LABOPIA, COCAINSCRNUR, LABBENZ, AMPHETMU, THCU, LABBARB  Alcohol Level: No results for input(s): ETH in the last 168 hours.  Other results: EKG: sinus rhythm at 96 bpm.  Imaging: US Venous Img Lower Unilateral Left  Result Date: 06/07/2017 CLINICAL DATA:  Left lower extremity edema. EXAM: LEFT LOWER EXTREMITY VENOUS DOPPLER ULTRASOUND TECHNIQUE: Gray-scale sonography with graded compression, as well as color Doppler and duplex ultrasound were performed to evaluate the lower extremity deep venous systems from the level of the common femoral vein and including the common femoral, femoral, profunda femoral, popliteal and calf veins including the posterior tibial, peroneal and gastrocnemius veins when visible. The superficial great saphenous vein was also interrogated. Spectral Doppler was utilized to evaluate flow at rest and with distal augmentation maneuvers in the common femoral, femoral and popliteal veins. COMPARISON:  09/10/2013 FINDINGS: Contralateral Common Femoral Vein: Respiratory phasicity is normal and symmetric with the symptomatic side. No evidence of thrombus. Normal compressibility. Common Femoral Vein: No evidence of thrombus. Normal compressibility, respiratory phasicity and response to augmentation. Saphenofemoral Junction: No evidence of thrombus. Normal compressibility and flow on color Doppler imaging. Profunda Femoral Vein: No evidence of thrombus. Normal compressibility and flow on color Doppler imaging. Femoral Vein: No evidence of thrombus. Normal compressibility, respiratory phasicity and response to augmentation. Popliteal Vein: No evidence of thrombus. Normal compressibility, respiratory phasicity and response to augmentation. Calf Veins: No evidence of thrombus. Normal compressibility and flow on color Doppler  imaging. Superficial Great Saphenous Vein: No evidence of thrombus. Normal compressibility. Venous Reflux:  None. Other Findings: No evidence of superficial thrombophlebitis or abnormal fluid collection. IMPRESSION: No evidence of left lower extremity deep venous thrombosis. Electronically Signed   By: Irish Lack M.D.   On: 06/07/2017 17:12   Dg Chest Portable 1 View  Result Date: 06/07/2017 CLINICAL DATA:  Shortness of breath, body aches, cough and drowsiness for a few days. Smoker. EXAM: PORTABLE CHEST 1 VIEW COMPARISON:  CT chest and PA and lateral chest 09/29/2016. FINDINGS: There cardiomegaly and interstitial edema. Left pleural effusion and basilar airspace disease are seen. No pneumothorax. IMPRESSION: Cardiomegaly and interstitial edema. Left pleural effusion and basilar airspace disease which could be due to atelectasis or pneumonia. Electronically Signed   By: Drusilla Kanner M.D.   On: 06/07/2017 11:43     Assessment/Plan: 52 year old female that complains of involuntary jerking.  It is intermittent.  Has not bee noted on the examination today.  Suspect this is related to her respiratory disease.  Blood gas shows elevated  CO2 and decreased O2.  Continues to smoke.  Patient on Clonazepam but reports taking it rarely.    Recommendations: 1.  Patient may take Klonopin prn for involuntary movement 2.  TSH, B12  Thana Farr, MD Neurology [phone] 06/08/2017, 11:59 AM

## 2017-06-09 LAB — HIV ANTIBODY (ROUTINE TESTING W REFLEX): HIV Screen 4th Generation wRfx: NONREACTIVE

## 2017-06-09 LAB — VITAMIN B12: VITAMIN B 12: 377 pg/mL (ref 180–914)

## 2017-06-09 LAB — TSH: TSH: 2.133 u[IU]/mL (ref 0.350–4.500)

## 2017-06-09 NOTE — Progress Notes (Signed)
[facility] [place] [facility]   PATIENT NAME: [name]    MR#:  [id number]  DATE OF BIRTH:  1965/11/26  SUBJECTIVE:  CHIEF COMPLAINT: Patient shortness of breath is better but reporting intermittent episodes of cough and on 4 L of oxygen via nasal cannula.  Not on home oxygen.  Daughter at bedside.  REVIEW OF SYSTEMS:  CONSTITUTIONAL: No fever, fatigue or weakness.  EYES: No blurred or double vision.  EARS, NOSE, AND THROAT: No tinnitus or ear pain.  RESPIRATORY: Reporting intermittent episodes of cough and improving shortness of breath, denies wheezing or hemoptysis.  CARDIOVASCULAR: No chest pain, orthopnea, edema.  GASTROINTESTINAL: No nausea, vomiting, diarrhea or abdominal pain.  GENITOURINARY: No dysuria, hematuria.  ENDOCRINE: No polyuria, nocturia,  HEMATOLOGY: No anemia, easy bruising or bleeding SKIN: No rash or lesion. MUSCULOSKELETAL: No joint pain or arthritis.   NEUROLOGIC: No tingling, numbness, weakness.  PSYCHIATRY: No anxiety or depression.   DRUG ALLERGIES:  No Known Allergies  VITALS:  Blood pressure (!) 114/52, pulse 94, temperature 98.4 F (36.9 C), temperature source Oral, resp. rate 20, height 5' (1.524 m), weight 120.9 kg (266 lb 9 oz), SpO2 93 %.  PHYSICAL EXAMINATION:  GENERAL:  52 y.o.-year-old patient lying in the bed with no acute distress.  EYES: Pupils equal, round, reactive to light and accommodation. No scleral icterus. Extraocular muscles intact.  HEENT: Head atraumatic, normocephalic. Oropharynx and nasopharynx clear.  NECK:  Supple, no jugular venous distention. No thyroid enlargement, no tenderness.  LUNGS: Moderate breath sounds bilaterally, no wheezing, rales,rhonchi or crepitation. No use of accessory muscles of respiration.  CARDIOVASCULAR: S1, S2 normal. No murmurs, rubs, or gallops.  ABDOMEN: Soft, nontender, nondistended. Bowel sounds present. No organomegaly or mass.  EXTREMITIES: No pedal edema,  cyanosis, or clubbing.  NEUROLOGIC: Cranial nerves II through XII are intact. Muscle strength 5/5 in all extremities. Sensation intact. Gait not checked.  PSYCHIATRIC: The patient is alert and oriented x 3.  SKIN: No obvious rash, lesion, or ulcer.    LABORATORY PANEL:   CBC Recent Labs  Lab 06/08/17 0554  WBC 17.2*  HGB 16.4*  HCT 53.6*  PLT 221   ------------------------------------------------------------------------------------------------------------------  Chemistries  Recent Labs  Lab 06/07/17 1055 06/08/17 0554  NA 134* 135  K 4.7 4.9  CL 94* 94*  CO2 32 31  GLUCOSE 114* 197*  BUN 10 11  CREATININE 0.87 0.74  CALCIUM 9.1 8.8*  AST 20  --   ALT 23  --   ALKPHOS 92  --   BILITOT 1.0  --    ------------------------------------------------------------------------------------------------------------------  Cardiac Enzymes Recent Labs  Lab 06/07/17 1055  TROPONINI <0.03   ------------------------------------------------------------------------------------------------------------------  RADIOLOGY:  [place] Venous Img Lower Unilateral Left  Result Date: 06/07/2017 CLINICAL DATA:  Left lower extremity edema. EXAM: LEFT LOWER EXTREMITY VENOUS DOPPLER ULTRASOUND TECHNIQUE: Gray-scale sonography with graded compression, as well as color Doppler and duplex ultrasound were performed to evaluate the lower extremity deep venous systems from the level of the common femoral vein and including the common femoral, femoral, profunda femoral, popliteal and calf veins including the posterior tibial, peroneal and gastrocnemius veins when visible. The superficial great saphenous vein was also interrogated. Spectral Doppler was utilized to evaluate flow at rest and with distal augmentation maneuvers in the common femoral, femoral and popliteal veins. COMPARISON:  09/10/2013 FINDINGS: Contralateral Common Femoral Vein: Respiratory phasicity is normal and symmetric with the symptomatic side.  No evidence of thrombus. Normal compressibility. Common Femoral  Vein: No evidence of thrombus. Normal compressibility, respiratory phasicity and response to augmentation. Saphenofemoral Junction: No evidence of thrombus. Normal compressibility and flow on color Doppler imaging. Profunda Femoral Vein: No evidence of thrombus. Normal compressibility and flow on color Doppler imaging. Femoral Vein: No evidence of thrombus. Normal compressibility, respiratory phasicity and response to augmentation. Popliteal Vein: No evidence of thrombus. Normal compressibility, respiratory phasicity and response to augmentation. Calf Veins: No evidence of thrombus. Normal compressibility and flow on color Doppler imaging. Superficial Great Saphenous Vein: No evidence of thrombus. Normal compressibility. Venous Reflux:  None. Other Findings: No evidence of superficial thrombophlebitis or abnormal fluid collection. IMPRESSION: No evidence of left lower extremity deep venous thrombosis. Electronically Signed   By: Irish Lack M.D.   On: 06/07/2017 17:12    EKG:   Orders placed or performed during the hospital encounter of 06/07/17  . ED EKG  . ED EKG  . EKG 12-Lead  . EKG 12-Lead  . EKG    ASSESSMENT AND PLAN:    52 year old female with history of tobacco dependence and COPD presented shortness of breath and wheezing.  1. Sepsis with Acute hypoxic respiratory failure in the setting of COPD exacerbation and community-acquired pneumonia Patient presents with leukocytosis, tachypnea and tachycardia Wean oxygen as tolerated, currently on 4 L of oxygen  2. Acute COPD exacerbation:  IV steroids tapering as clinically permits Continue nebs and inhalers  3. Community-acquired pneumonia: Continue Rocephin and azithromycin   4. Tobacco dependence: Patient is encouraged to quit smoking. Counseling was provided for 4 minutes.   5. Lower extremity edema, left: LLE venous Doppler - NEG DVT check echocardiogram  to evaluate for underlying congestive heart failure   6. Morbid obesity: Patient encouraged to lose weight as tolerated. Patient would benefit from outpatient sleep evaluation to diagnose underlying sleep apnea.  7.  Myoclonic jerks versus twitching-neurology consulted.  Recommending Klonopin as needed for involuntary movements B12 and TSH are normal      All the records are reviewed and case discussed with Care Management/Social Workerr. Management plans discussed with the patient, family and they are in agreement.  CODE STATUS: fc   TOTAL TIME TAKING CARE OF THIS PATIENT: 36 minutes.   POSSIBLE D/C IN 1  DAYS, DEPENDING ON CLINICAL CONDITION.  Note: This dictation was prepared with Dragon dictation along with smaller phrase technology. Any transcriptional errors that result from this process are unintentional.   Ramonita Lab M.D on 06/09/2017 at 3:30 PM  Between 7am to 6pm - Pager - [phone] After 6pm go to [email or web address] - password [email or web address] [facility]  [place] [facility]  Office  [phone]  CC: Primary care physician; Cathlean Marseilles, MD

## 2017-06-09 NOTE — Progress Notes (Signed)
Increased to 4l [place] after neb treatment due to sats of 86%

## 2017-06-09 NOTE — Progress Notes (Signed)
Dr. Amado CoeGouru ordered to lower patient's oxygen from 6L to 3L. Pulse oximetry came to 85%, moved oxygen to 4L, pulse ox at 92%

## 2017-06-09 NOTE — Plan of Care (Signed)
  Spiritual Needs Ability to function at adequate level 06/09/2017 2004 - Progressing by Donnel SaxonKennedy, Woodford Strege L, RN   Education: Knowledge of General Education information will improve 06/09/2017 2004 - Progressing by Donnel SaxonKennedy, Kylii Ennis L, RN   Health Behavior/Discharge Planning: Ability to manage health-related needs will improve 06/09/2017 2004 - Progressing by Donnel SaxonKennedy, Caige Almeda L, RN   Clinical Measurements: Ability to maintain clinical measurements within normal limits will improve 06/09/2017 2004 - Progressing by Donnel SaxonKennedy, Shanessa Hodak L, RN   Activity: Ability to tolerate increased activity will improve 06/09/2017 2004 - Progressing by Donnel SaxonKennedy, Breiona Couvillon L, RN   Clinical Measurements: Ability to maintain a body temperature in the normal range will improve 06/09/2017 2004 - Progressing by Donnel SaxonKennedy, Dalila Arca L, RN   Respiratory: Ability to maintain adequate ventilation will improve 06/09/2017 2004 - Progressing by Donnel SaxonKennedy, Aziah Brostrom L, RN

## 2017-06-10 ENCOUNTER — Inpatient Hospital Stay: Payer: Medicaid Other

## 2017-06-10 ENCOUNTER — Encounter: Payer: Self-pay | Admitting: Radiology

## 2017-06-10 MED ORDER — IOPAMIDOL (ISOVUE-370) INJECTION 76%
75.0000 mL | Freq: Once | INTRAVENOUS | Status: AC | PRN
  Administered 2017-06-10: 13:00:00 75 mL via INTRAVENOUS

## 2017-06-10 MED ORDER — METHYLPREDNISOLONE SODIUM SUCC 40 MG IJ SOLR
40.0000 mg | Freq: Three times a day (TID) | INTRAMUSCULAR | Status: DC
  Administered 2017-06-10 – 2017-06-11 (×2): 40 mg via INTRAVENOUS
  Filled 2017-06-10 (×2): qty 1

## 2017-06-10 MED ORDER — FUROSEMIDE 10 MG/ML IJ SOLN
20.0000 mg | Freq: Two times a day (BID) | INTRAMUSCULAR | Status: DC
  Administered 2017-06-11 – 2017-06-12 (×3): 20 mg via INTRAVENOUS
  Filled 2017-06-10 (×3): qty 2

## 2017-06-10 MED ORDER — FUROSEMIDE 10 MG/ML IJ SOLN
20.0000 mg | Freq: Once | INTRAMUSCULAR | Status: AC
Start: 2017-06-10 — End: 2017-06-10
  Administered 2017-06-10: 20 mg via INTRAVENOUS
  Filled 2017-06-10: qty 2

## 2017-06-10 NOTE — Progress Notes (Signed)
Oxygen saturation dropping down in the 70's while asleep, woke patient up, sats came up to 88%. Notified respiratory therapy. Increased 02 to 50 liters and 40%. Saturations increased to 90%. Notified Dr. Amado CoeGouru, received verbal order for lasix every 12 hours and solumedrol every 8 hours and CPAP at night for when patient is asleep. Patient seems to have sleep apnea.

## 2017-06-10 NOTE — Plan of Care (Signed)
  Spiritual Needs Ability to function at adequate level 06/10/2017 2028 - Progressing by Donnel SaxonKennedy, Vi Biddinger L, RN   Education: Knowledge of General Education information will improve 06/10/2017 2028 - Progressing by Donnel SaxonKennedy, Adisen Bennion L, RN   Health Behavior/Discharge Planning: Ability to manage health-related needs will improve 06/10/2017 2028 - Progressing by Donnel SaxonKennedy, Aylla Huffine L, RN   Clinical Measurements: Ability to maintain clinical measurements within normal limits will improve 06/10/2017 2028 - Progressing by Donnel SaxonKennedy, Fadil Macmaster L, RN   Activity: Ability to tolerate increased activity will improve 06/10/2017 2028 - Progressing by Donnel SaxonKennedy, Jovanie Verge L, RN   Clinical Measurements: Ability to maintain a body temperature in the normal range will improve 06/10/2017 2028 - Progressing by Donnel SaxonKennedy, Bevelyn Arriola L, RN   Respiratory: Ability to maintain adequate ventilation will improve 06/10/2017 2028 - Progressing by Donnel SaxonKennedy, Stefanny Pieri L, RN

## 2017-06-10 NOTE — Progress Notes (Signed)
[place] [facility] [place] NAME: [name]    MR#:  [id number]  DATE OF BIRTH:  [birth year]  SUBJECTIVE:  CHIEF COMPLAINT: Patient shortness of breath is worse today requiring high flow oxygen as patient was hypoxemic. Not on home oxygen.  Daughter at bedside.  REVIEW OF SYSTEMS:  CONSTITUTIONAL: No fever, fatigue or weakness.  EYES: No blurred or double vision.  EARS, NOSE, AND THROAT: No tinnitus or ear pain.  RESPIRATORY: Reporting intermittent episodes of cough and improving shortness of breath, denies wheezing or hemoptysis.  CARDIOVASCULAR: No chest pain, orthopnea, edema.  GASTROINTESTINAL: No nausea, vomiting, diarrhea or abdominal pain.  GENITOURINARY: No dysuria, hematuria.  ENDOCRINE: No polyuria, nocturia,  HEMATOLOGY: No anemia, easy bruising or bleeding SKIN: No rash or lesion. MUSCULOSKELETAL: No joint pain or arthritis.   NEUROLOGIC: No tingling, numbness, weakness.  PSYCHIATRY: No anxiety or depression.   DRUG ALLERGIES:  No Known Allergies  VITALS:  Blood pressure 138/67, pulse 92, temperature 99 F (37.2 C), temperature source Oral, resp. rate 20, height 5' (1.524 m), weight 123.1 kg (271 lb 6.2 oz), SpO2 97 %.  PHYSICAL EXAMINATION:  GENERAL:  52 y.o.-year-old patient lying in the bed with no acute distress.  EYES: Pupils equal, round, reactive to light and accommodation. No scleral icterus. Extraocular muscles intact.  HEENT: Head atraumatic, normocephalic. Oropharynx and nasopharynx clear.  NECK:  Supple, no jugular venous distention. No thyroid enlargement, no tenderness.  LUNGS: Moderate breath sounds bilaterally, no wheezing, rales,rhonchi or crepitation. No use of accessory muscles of respiration.  CARDIOVASCULAR: S1, S2 normal. No murmurs, rubs, or gallops.  ABDOMEN: Soft, nontender, nondistended. Bowel sounds present. No organomegaly or mass.  EXTREMITIES: No pedal edema, cyanosis, or clubbing.   NEUROLOGIC: Cranial nerves II through XII are intact. Muscle strength 5/5 in all extremities. Sensation intact. Gait not checked.  PSYCHIATRIC: The patient is alert and oriented x 3.  SKIN: No obvious rash, lesion, or ulcer.    LABORATORY PANEL:   CBC Recent Labs  Lab 06/08/17 0554  WBC 17.2*  HGB 16.4*  HCT 53.6*  PLT 221   ------------------------------------------------------------------------------------------------------------------  Chemistries  Recent Labs  Lab 06/07/17 1055 06/08/17 0554  NA 134* 135  K 4.7 4.9  CL 94* 94*  CO2 32 31  GLUCOSE 114* 197*  BUN 10 11  CREATININE 0.87 0.74  CALCIUM 9.1 8.8*  AST 20  --   ALT 23  --   ALKPHOS 92  --   BILITOT 1.0  --    ------------------------------------------------------------------------------------------------------------------  Cardiac Enzymes Recent Labs  Lab 06/07/17 1055  TROPONINI <0.03   ------------------------------------------------------------------------------------------------------------------  RADIOLOGY:  Dg Chest 2 View  Result Date: 06/10/2017 CLINICAL DATA:  52 year old female with a history of shortness of breath EXAM: CHEST  2 VIEW COMPARISON:  06/07/2017, 09/29/2016, CT chest 09/29/2016 FINDINGS: Cardiomediastinal silhouette unchanged with cardiomegaly. Central vascular congestion. Diffuse interlobular septal thickening with interstitial opacities. No pneumothorax. Lateral view demonstrates blunting of the costophrenic sulcus. IMPRESSION: Similar appearance of the chest x-ray with mixed interlobular septal thickening and interstitial opacities, with small pleural effusion. Leading differential diagnosis is congestive heart failure, although it is difficult to exclude atypical infection. Electronically Signed   By: Corrie Mckusick D.O.   On: 06/10/2017 10:01   Ct Angio Chest Pe W Or Wo Contrast  Result Date: 06/10/2017 CLINICAL DATA:  Decreased oxygen saturation today.  Smoker. EXAM: CT  ANGIOGRAPHY CHEST WITH CONTRAST TECHNIQUE: Multidetector CT imaging of the chest  was performed using the standard protocol during bolus administration of intravenous contrast. Multiplanar CT image reconstructions and MIPs were obtained to evaluate the vascular anatomy. CONTRAST:  75 ml ISOVUE-370 IOPAMIDOL (ISOVUE-370) INJECTION 76% COMPARISON:  CT chest 09/29/2016 and 09/09/2013. PA and lateral chest earlier today and 01/03/2016. FINDINGS: Cardiovascular: No pulmonary embolus is identified. There is cardiomegaly. No pericardial effusion. The pulmonary arteries are prominent suggestive of pulmonary arterial hypertension. A few scattered calcific aortic and coronary atherosclerotic calcifications are identified. Mediastinum/Nodes: Prevascular node measuring 1.4 cm on image 27 is unchanged since 2015. Left hilar node on image 44 measures 1.7 cm short axis dimension, increased from 0.6 cm on the most recent exam. There is also a subcarinal lymph node measuring 1.4 cm on image 42 which is new since the most recent exam. Thyroid gland, trachea, and esophagus demonstrate no significant findings. Lungs/Pleura: Small left pleural effusion is identified. There is a trace right effusion. The lungs are emphysematous. There is diffuse ground-glass attenuation throughout the lungs. A pulmonary nodule in the right middle lobe on image 45 measures 0.5 x 0.6 cm, unchanged to slightly smaller than on the comparison exams. No consolidative process is seen. Upper Abdomen: No acute abnormality. Musculoskeletal: No focal bony abnormality. Review of the MIP images confirms the above findings. IMPRESSION: Negative for pulmonary embolus. Diffuse ground-glass attenuation throughout the lungs is likely due to pulmonary edema given small left pleural effusion and marked cardiomegaly. 0.6 cm pulmonary nodule is unchanged since 2015 consistent with benignity. No follow-up imaging is recommended. Some increase in mediastinal and hilar  lymphadenopathy since 09/29/2016 is presumably reactive. Prominence of the pulmonary arteries worrisome for pulmonary arterial hypertension. Aortic Atherosclerosis (ICD10-I70.0) and Emphysema (ICD10-J43.9). Electronically Signed   By: Inge Rise M.D.   On: 06/10/2017 13:07    EKG:   Orders placed or performed during the hospital encounter of 06/07/17  . ED EKG  . ED EKG  . EKG 12-Lead  . EKG 12-Lead  . EKG    ASSESSMENT AND PLAN:    52 year old female with history of tobacco dependence and COPD presented shortness of breath and wheezing.  #.Acute hypoxic respiratory failure in the setting of COPD exacerbation and community-acquired pneumonia Patient presents with leukocytosis, tachypnea and tachycardia-met septic criteria at the time of admission Wean oxygen as tolerated, currently on high flow oxygen  CT angiogram negative for pulmonary embolism   #Acute CHF with pulmonary edema, lower extremity edema cardiomegaly and pulmonary arterial hypertension Echocardiogram done results are pending Lasix IV every 12 hours Continue aspirin.  Check fasting lipid panel Patient not on beta-blocker in view of acute COPD. Consult cardiology Daily weights, intake and output DVT ruled out with negative left leg venous Dopplers  #Acute COPD exacerbation:  IV steroids tapering as clinically permits Continue nebs and inhalers  #. Community-acquired pneumonia: Continue Rocephin and azithromycin   #. Tobacco dependence: Patient is encouraged to quit smoking. Counseling was provided for 4 minutes.  #morbid obesity: Patient encouraged to lose weight as tolerated. Patient would benefit from outpatient sleep evaluation to diagnose underlying sleep apnea.  #Myoclonic jerks versus twitching-neurology consulted.  Recommending Klonopin as needed for involuntary movements B12 and TSH are normal      All the records are reviewed and case discussed with Care Management/Social  Workerr. Management plans discussed with the patient, family and they are in agreement.  CODE STATUS: fc   TOTAL TIME TAKING CARE OF THIS PATIENT: 36 minutes.   POSSIBLE D/C IN 1  DAYS, DEPENDING  ON CLINICAL CONDITION.  Note: This dictation was prepared with Dragon dictation along with smaller phrase technology. Any transcriptional errors that result from this process are unintentional.   Nicholes Mango M.D on 06/10/2017 at 8:57 PM  Between 7am to 6pm - Pager - [phone] After 6pm go to [email or web address] - password [email or web address] [facility]  Office  [phone]  CC: Primary care physician; Gennaro Africa, MD

## 2017-06-10 NOTE — Progress Notes (Signed)
RT was called to room to assess patient due to oxygen saturations in the low 80's despite RN increasing liter flow. Pt's saturations were fluctuating between 84-92% on 6l [place] when RT arrived. Patient is a CO2 retainer, RT gave patient her neb treatment and then placed her on a HFNC. Patient was placed on 36% and 40 liters of flow. Patients saturations came up and stayed between 89 and 91%. Sat goal for this patient is 88-92% per Dr. Arita MissGorou. Dr Arita MissGorou notified of patients saturations.

## 2017-06-10 NOTE — Progress Notes (Addendum)
Patients saturations have been fluctuating and dropping into the upper 70's and 80's especially when patient falls to sleep. Patient is oriented and does not seem to be in distress, increased to 50 liters and 40%. Saturations increased to 90%. Left here, RN notified.

## 2017-06-10 NOTE — Progress Notes (Signed)
Oxygen saturations in the low 80's despite 02 up to 6L. Notified respiratory therapy.  Received verbal order from Dr. Amado CoeGouru to initiate high flow nasal cannula, lasix 20mg  one time dose and CT scan of the chest with contrast. Saturation came up and stayed between 89 and 91%. Saturation goal is 88-92% per Dr. Amado CoeGouru per COPD. Dr. Amado CoeGouru notified of status.

## 2017-06-11 ENCOUNTER — Other Ambulatory Visit: Payer: Self-pay

## 2017-06-11 ENCOUNTER — Encounter: Payer: Self-pay | Admitting: Nurse Practitioner

## 2017-06-11 DIAGNOSIS — J9620 Acute and chronic respiratory failure, unspecified whether with hypoxia or hypercapnia: Secondary | ICD-10-CM

## 2017-06-11 DIAGNOSIS — I5031 Acute diastolic (congestive) heart failure: Secondary | ICD-10-CM

## 2017-06-11 LAB — LIPID PANEL
Cholesterol: 134 mg/dL (ref 0–200)
HDL: 36 mg/dL — AB (ref >40)
LDL Cholesterol: 78 mg/dL (ref 0–99)
Total CHOL/HDL Ratio: 3.7 RATIO
Triglycerides: 98 mg/dL (ref <150)
VLDL: 20 mg/dL (ref 0–40)

## 2017-06-11 MED ORDER — IPRATROPIUM-ALBUTEROL 0.5-2.5 (3) MG/3ML IN SOLN
3.0000 mL | Freq: Three times a day (TID) | RESPIRATORY_TRACT | Status: DC
  Administered 2017-06-11 – 2017-06-12 (×4): 3 mL via RESPIRATORY_TRACT
  Filled 2017-06-11 (×4): qty 3

## 2017-06-11 MED ORDER — METHYLPREDNISOLONE SODIUM SUCC 40 MG IJ SOLR
40.0000 mg | Freq: Two times a day (BID) | INTRAMUSCULAR | Status: DC
  Administered 2017-06-11 – 2017-06-12 (×2): 40 mg via INTRAVENOUS
  Filled 2017-06-11 (×2): qty 1

## 2017-06-11 NOTE — Progress Notes (Signed)
[place] [facility] [place] NAME: [name]    MR#:  [id number]  DATE OF BIRTH:  Mar 13, 1966  SUBJECTIVE:  CHIEF COMPLAINT: Patient shortness of breath is BETTER today .  Patient is on 5-6 L of oxygen today, not on home oxygen.  Daughter at bedside.  REVIEW OF SYSTEMS:  CONSTITUTIONAL: No fever, fatigue or weakness.  EYES: No blurred or double vision.  EARS, NOSE, AND THROAT: No tinnitus or ear pain.  RESPIRATORY: Reporting intermittent episodes of cough and improving shortness of breath, denies wheezing or hemoptysis.  CARDIOVASCULAR: No chest pain, orthopnea, edema.  GASTROINTESTINAL: No nausea, vomiting, diarrhea or abdominal pain.  GENITOURINARY: No dysuria, hematuria.  ENDOCRINE: No polyuria, nocturia,  HEMATOLOGY: No anemia, easy bruising or bleeding SKIN: No rash or lesion. MUSCULOSKELETAL: No joint pain or arthritis.   NEUROLOGIC: No tingling, numbness, weakness.  PSYCHIATRY: No anxiety or depression.   DRUG ALLERGIES:  No Known Allergies  VITALS:  Blood pressure (!) 108/48, pulse 85, temperature 97.7 F (36.5 C), temperature source Oral, resp. rate (!) 24, height 5' (1.524 m), weight 120.9 kg (266 lb 8.6 oz), SpO2 91 %.  PHYSICAL EXAMINATION:  GENERAL:  52 y.o.-year-old patient lying in the bed with no acute distress.  EYES: Pupils equal, round, reactive to light and accommodation. No scleral icterus. Extraocular muscles intact.  HEENT: Head atraumatic, normocephalic. Oropharynx and nasopharynx clear.  NECK:  Supple, no jugular venous distention. No thyroid enlargement, no tenderness.  LUNGS: Moderate breath sounds bilaterally, no wheezing, rales,rhonchi or crepitation. No use of accessory muscles of respiration.  CARDIOVASCULAR: S1, S2 normal. No murmurs, rubs, or gallops.  ABDOMEN: Soft, nontender, nondistended. Bowel sounds present. No organomegaly or mass.  EXTREMITIES: No pedal edema, cyanosis, or clubbing.   NEUROLOGIC: Cranial nerves II through XII are intact. Muscle strength 5/5 in all extremities. Sensation intact. Gait not checked.  PSYCHIATRIC: The patient is alert and oriented x 3.  SKIN: No obvious rash, lesion, or ulcer.    LABORATORY PANEL:   CBC Recent Labs  Lab 06/08/17 0554  WBC 17.2*  HGB 16.4*  HCT 53.6*  PLT 221   ------------------------------------------------------------------------------------------------------------------  Chemistries  Recent Labs  Lab 06/07/17 1055 06/08/17 0554  NA 134* 135  K 4.7 4.9  CL 94* 94*  CO2 32 31  GLUCOSE 114* 197*  BUN 10 11  CREATININE 0.87 0.74  CALCIUM 9.1 8.8*  AST 20  --   ALT 23  --   ALKPHOS 92  --   BILITOT 1.0  --    ------------------------------------------------------------------------------------------------------------------  Cardiac Enzymes Recent Labs  Lab 06/07/17 1055  TROPONINI <0.03   ------------------------------------------------------------------------------------------------------------------  RADIOLOGY:  Dg Chest 2 View  Result Date: 06/10/2017 CLINICAL DATA:  52 year old female with a history of shortness of breath EXAM: CHEST  2 VIEW COMPARISON:  06/07/2017, 09/29/2016, CT chest 09/29/2016 FINDINGS: Cardiomediastinal silhouette unchanged with cardiomegaly. Central vascular congestion. Diffuse interlobular septal thickening with interstitial opacities. No pneumothorax. Lateral view demonstrates blunting of the costophrenic sulcus. IMPRESSION: Similar appearance of the chest x-ray with mixed interlobular septal thickening and interstitial opacities, with small pleural effusion. Leading differential diagnosis is congestive heart failure, although it is difficult to exclude atypical infection. Electronically Signed   By: Corrie Mckusick D.O.   On: 06/10/2017 10:01   Ct Angio Chest Pe W Or Wo Contrast  Result Date: 06/10/2017 CLINICAL DATA:  Decreased oxygen saturation today.  Smoker. EXAM: CT  ANGIOGRAPHY CHEST WITH CONTRAST TECHNIQUE: Multidetector CT  imaging of the chest was performed using the standard protocol during bolus administration of intravenous contrast. Multiplanar CT image reconstructions and MIPs were obtained to evaluate the vascular anatomy. CONTRAST:  75 ml ISOVUE-370 IOPAMIDOL (ISOVUE-370) INJECTION 76% COMPARISON:  CT chest 09/29/2016 and 09/09/2013. PA and lateral chest earlier today and 01/03/2016. FINDINGS: Cardiovascular: No pulmonary embolus is identified. There is cardiomegaly. No pericardial effusion. The pulmonary arteries are prominent suggestive of pulmonary arterial hypertension. A few scattered calcific aortic and coronary atherosclerotic calcifications are identified. Mediastinum/Nodes: Prevascular node measuring 1.4 cm on image 27 is unchanged since 2015. Left hilar node on image 44 measures 1.7 cm short axis dimension, increased from 0.6 cm on the most recent exam. There is also a subcarinal lymph node measuring 1.4 cm on image 42 which is new since the most recent exam. Thyroid gland, trachea, and esophagus demonstrate no significant findings. Lungs/Pleura: Small left pleural effusion is identified. There is a trace right effusion. The lungs are emphysematous. There is diffuse ground-glass attenuation throughout the lungs. A pulmonary nodule in the right middle lobe on image 45 measures 0.5 x 0.6 cm, unchanged to slightly smaller than on the comparison exams. No consolidative process is seen. Upper Abdomen: No acute abnormality. Musculoskeletal: No focal bony abnormality. Review of the MIP images confirms the above findings. IMPRESSION: Negative for pulmonary embolus. Diffuse ground-glass attenuation throughout the lungs is likely due to pulmonary edema given small left pleural effusion and marked cardiomegaly. 0.6 cm pulmonary nodule is unchanged since 2015 consistent with benignity. No follow-up imaging is recommended. Some increase in mediastinal and hilar  lymphadenopathy since 09/29/2016 is presumably reactive. Prominence of the pulmonary arteries worrisome for pulmonary arterial hypertension. Aortic Atherosclerosis (ICD10-I70.0) and Emphysema (ICD10-J43.9). Electronically Signed   By: Inge Rise M.D.   On: 06/10/2017 13:07    EKG:   Orders placed or performed during the hospital encounter of 06/07/17  . ED EKG  . ED EKG  . EKG 12-Lead  . EKG 12-Lead  . EKG  . EKG 12-Lead  . EKG 12-Lead    ASSESSMENT AND PLAN:    52 year old female with history of tobacco dependence and COPD presented shortness of breath and wheezing.  #.Acute hypoxic respiratory failure in the setting of COPD exacerbation and community-acquired pneumonia Patient presents with leukocytosis, tachypnea and tachycardia-met septic criteria at the time of admission Wean oxygen as tolerated, currently on 5-6 lit of o2  CT angiogram negative for pulmonary embolism   #Acute CHF -diastolic with pulmonary edema, lower extremity edema cardiomegaly and pulmonary arterial hypertension Echocardiogram with normal ejection fraction Lasix IV every 12 hours Continue aspirin.  LDL 78 Patient not on beta-blocker in view of acute COPD. [facility]ology is following, agreeable with the current plan of care and will consider right and left heart  catheterization if no clinical improvement Daily weights, intake and output DVT ruled out with negative left leg venous Dopplers  #Acute COPD exacerbation:  IV steroids tapering as clinically permits Continue nebs and inhalers  #. Community-acquired pneumonia: Continue Rocephin and azithromycin   #. Tobacco dependence: Patient is encouraged to quit smoking. Counseling was provided for 4 minutes.  #morbid obesity: Patient encouraged to lose weight as tolerated. Patient would benefit from outpatient sleep evaluation to diagnose underlying sleep apnea.  #Myoclonic jerks versus twitching-neurology consulted.  Recommending  Klonopin as needed for involuntary movements B12 and TSH are normal  PT consult    All the records are reviewed and case discussed with Care Management/Social Workerr. Management  plans discussed with the patient, family and they are in agreement.  CODE STATUS: fc   TOTAL TIME TAKING CARE OF THIS PATIENT: 36 minutes.   POSSIBLE D/C IN 1  DAYS, DEPENDING ON CLINICAL CONDITION.  Note: This dictation was prepared with Dragon dictation along with smaller phrase technology. Any transcriptional errors that result from this process are unintentional.   Nicholes Mango M.D on 06/11/2017 at 3:18 PM  Between 7am to 6pm - Pager - [phone] After 6pm go to [email or web address] - password [email or web address] [facility]  Office  [phone]  CC: Primary care physician; Gennaro Africa, MD

## 2017-06-11 NOTE — Consult Note (Signed)
Cardiology Consult    Patient ID: [name] MRN: [id number], DOB/AGE: [id number]   Admit date: 06/07/2017 Date of Consult: 06/11/2017  Primary Physician: Angel Marseillesaaleman, Timothy P, MD Primary Cardiologist: Angel BearsMuhammad Arida, MD - new Requesting Provider: Elveria RoyalsA. Gouru, MD  Patient Profile    [name] is a 52 y.o. female with a history of tob abuse, COPD, obesity, and anxiety, who is being seen today for the evaluation of dyspnea and CHF at the request of Dr. Amado CoeGouru..  Past Medical History   Past Medical History:  Diagnosis Date  . Anxiety   . COPD (chronic obstructive pulmonary disease) (HCC)   . Essential hypertension   . History of echocardiogram    a. 08/2013 Echo: EF 55-60%, impaired LV relaxation, mild LVH, nl RV fxn, nl RVSP, mild TR; b. 05/2017 Echo: EF 65-70%, no rwma, nl RV fxn.  . Morbid obesity (HCC)   . Tobacco abuse     Past Surgical History:  Procedure Laterality Date  . ABDOMINAL HYSTERECTOMY    . CHOLECYSTECTOMY    . HERNIA REPAIR       Allergies  No Known Allergies  History of Present Illness    52 y/o ? without a prior cardiac history.  She was recently dx with HTN and otw has a h/o tob abuse since her teenage years, COPD, chronic cough, morbid obesity, and anxiety.  Just over a month ago, she began to experience intermittent 'dull aching' in her chest, occurring only @ rest, w/o assoc Ss, lasting 3-5 mins, and resolving spontaneously.  She saw her PCP and was Rx prn ntg.  She was also scheduled for a stress test @ [facility], but she later developed a nosebleed and had to cancel that test.  About 2 wks ago, she began to note soreness of her left mid-back w/ CVA tenderness.  She thought maybe she was developing a UTI.  She also felt a little more short of breath than usual and suspected she was on the brink of a COPD flare. She called her PCP early last week, hoping that he would send her in a Rx for an abx and a steroid, but instead, an appt was made for this week.  She  says that she has some degree of chronic DOE with intermittent lower ext edema - usually dependent in nature.  Though she was having increased dyspnea,she denies any increase in edema, change in abd girth, pnd, orthopnea, early satiety, chest pain, or palpitations.  She does not weigh herself @ home.  She says that left sided/back pain and CVA tenderness acutely worsened on the night prior to admission, and so she presented to the ED on the morning of 1/24.  Here, she was hypoxic.  ABG notable for resp acidosis w/ pH of 7.32 and PCO2 of 69.  Trop nl.  CXR showed cardiomegaly and interstitial edema, though there was also question of LLL PNA.  ECG w/ limb lead reversal (repeat pending).  WBC elevated @ 14.6.  Trop nl. LLE U/S neg for DVT.  She was treated with lasix in the ED and was admitted by the medicine service and placed on abx, nebs, and steroids.  Echo 1/25 showed nl LV and RV fxn.    Over the weekend, she continued to require high flow O2 via mask and breathing worsened on 1/27.  F/u CXR on 1/27 continued to show interstitial opacities and vascular congestion w/ small effusion  CHF vs atypical infxn.  This was followed by  CTA of the chest that showed edeman and sm left effusion w/ prominence of the pulmonary arteries - worrisome for PAH.  She was placed on IV lasix.  Breathing is stable but she remains on O2 via [place] @ 6lpm.    Inpatient Medications    . amLODipine  5 mg Oral Daily  . aspirin EC  81 mg Oral Daily  . chlorhexidine  15 mL Mouth Rinse BID  . enoxaparin (LOVENOX) injection  40 mg Subcutaneous Q12H  . furosemide  20 mg Intravenous Q12H  . ipratropium-albuterol  3 mL Nebulization TID  . mouth rinse  15 mL Mouth Rinse q12n4p  . methylPREDNISolone (SOLU-MEDROL) injection  40 mg Intravenous Q8H  . mometasone-formoterol  2 puff Inhalation BID  . sodium chloride flush  3 mL Intravenous Q12H    Family History    Family History  Problem Relation Age of Onset  . Dementia Mother   .  Heart attack Father    indicated that her mother is alive. She indicated that her father is deceased.   Social History    Social History   Socioeconomic History  . Marital status: Single    Spouse name: Not on file  . Number of children: Not on file  . Years of education: Not on file  . Highest education level: Not on file  Social Needs  . Financial resource strain: Not on file  . Food insecurity - worry: Not on file  . Food insecurity - inability: Not on file  . Transportation needs - medical: Not on file  . Transportation needs - non-medical: Not on file  Occupational History  . Not on file  Tobacco Use  . Smoking status: Current Every Day Smoker    Packs/day: 0.75    Years: 40.00    Pack years: 30.00    Types: Cigarettes  . Smokeless tobacco: Never Used  Substance and Sexual Activity  . Alcohol use: No  . Drug use: No  . Sexual activity: Not on file  Other Topics Concern  . Not on file  Social History Narrative   Lives in [place] with her mother, who has dementia.  She takes care of her mother and otw does not work.  She does not routinely exercise.     Review of Systems    General:  No chills, fever, night sweats or weight changes.  Cardiovascular:  +++ intermittent dull chest pain, +++ acute on chronic dyspnea on exertion, +++ mild bilat dependent edema @ baseline, no orthopnea, palpitations, paroxysmal nocturnal dyspnea. Dermatological: No rash, lesions/masses Respiratory: +++ chronic cough, +++ dyspnea Urologic: No hematuria, dysuria Abdominal:   No nausea, vomiting, diarrhea, bright red blood per rectum, melena, or hematemesis Neurologic:  No visual changes, wkns, changes in mental status. All other systems reviewed and are otherwise negative except as noted above.  Physical Exam    Blood pressure 138/67, pulse 92, temperature 99 F (37.2 C), temperature source Oral, resp. rate 20, height 5' (1.524 m), weight 266 lb 8.6 oz (120.9 kg), SpO2 (!) 86 %.    General: Pleasant, NAD.  Morbidly obese. Psych: Normal affect. Neuro: Alert and oriented X 3. Moves all extremities spontaneously. HEENT: Normal  Neck: Supple without bruits. Obese, difficult to gauge JVP. Lungs:  Resp regular and unlabored, CTA. Heart: RRR no s3, s4, or murmurs. Abdomen: Obese, soft, non-tender, non-distended, BS + x 4.  Extremities: No clubbing, cyanosis or edema. DP/PT/Radials 2+ and equal bilaterally.  Labs    Lab Results  Component Value Date   WBC 17.2 (H) 06/08/2017   HGB 16.4 (H) 06/08/2017   HCT 53.6 (H) 06/08/2017   MCV 82.3 06/08/2017   PLT 221 06/08/2017    Recent Labs  Lab 06/07/17 1055 06/08/17 0554  NA 134* 135  K 4.7 4.9  CL 94* 94*  CO2 32 31  BUN 10 11  CREATININE 0.87 0.74  CALCIUM 9.1 8.8*  PROT 7.7  --   BILITOT 1.0  --   ALKPHOS 92  --   ALT 23  --   AST 20  --   GLUCOSE 114* 197*   Lab Results  Component Value Date   CHOL 134 06/11/2017   HDL 36 (L) 06/11/2017   LDLCALC 78 06/11/2017   TRIG 98 06/11/2017     Radiology Studies    Dg Chest 2 View  Result Date: 06/10/2017 CLINICAL DATA:  52 year old female with a history of shortness of breath EXAM: CHEST  2 VIEW COMPARISON:  06/07/2017, 09/29/2016, CT chest 09/29/2016 FINDINGS: Cardiomediastinal silhouette unchanged with cardiomegaly. Central vascular congestion. Diffuse interlobular septal thickening with interstitial opacities. No pneumothorax. Lateral view demonstrates blunting of the costophrenic sulcus. IMPRESSION: Similar appearance of the chest x-ray with mixed interlobular septal thickening and interstitial opacities, with small pleural effusion. Leading differential diagnosis is congestive heart failure, although it is difficult to exclude atypical infection. Electronically Signed   By: Gilmer Mor D.O.   On: 06/10/2017 10:01   Ct Angio Chest Pe W Or Wo Contrast  Result Date: 06/10/2017 CLINICAL DATA:  Decreased oxygen saturation today.  Smoker. EXAM: CT  ANGIOGRAPHY CHEST WITH CONTRAST TECHNIQUE: Multidetector CT imaging of the chest was performed using the standard protocol during bolus administration of intravenous contrast. Multiplanar CT image reconstructions and MIPs were obtained to evaluate the vascular anatomy. CONTRAST:  75 ml ISOVUE-370 IOPAMIDOL (ISOVUE-370) INJECTION 76% COMPARISON:  CT chest 09/29/2016 and 09/09/2013. PA and lateral chest earlier today and 01/03/2016. FINDINGS: Cardiovascular: No pulmonary embolus is identified. There is cardiomegaly. No pericardial effusion. The pulmonary arteries are prominent suggestive of pulmonary arterial hypertension. A few scattered calcific aortic and coronary atherosclerotic calcifications are identified. Mediastinum/Nodes: Prevascular node measuring 1.4 cm on image 27 is unchanged since 2015. Left hilar node on image 44 measures 1.7 cm short axis dimension, increased from 0.6 cm on the most recent exam. There is also a subcarinal lymph node measuring 1.4 cm on image 42 which is new since the most recent exam. Thyroid gland, trachea, and esophagus demonstrate no significant findings. Lungs/Pleura: Small left pleural effusion is identified. There is a trace right effusion. The lungs are emphysematous. There is diffuse ground-glass attenuation throughout the lungs. A pulmonary nodule in the right middle lobe on image 45 measures 0.5 x 0.6 cm, unchanged to slightly smaller than on the comparison exams. No consolidative process is seen. Upper Abdomen: No acute abnormality. Musculoskeletal: No focal bony abnormality. Review of the MIP images confirms the above findings. IMPRESSION: Negative for pulmonary embolus. Diffuse ground-glass attenuation throughout the lungs is likely due to pulmonary edema given small left pleural effusion and marked cardiomegaly. 0.6 cm pulmonary nodule is unchanged since 2015 consistent with benignity. No follow-up imaging is recommended. Some increase in mediastinal and hilar  lymphadenopathy since 09/29/2016 is presumably reactive. Prominence of the pulmonary arteries worrisome for pulmonary arterial hypertension. Aortic Atherosclerosis (ICD10-I70.0) and Emphysema (ICD10-J43.9). Electronically Signed   By: Drusilla Kanner M.D.   On: 06/10/2017 13:07   US Venous Img Lower Unilateral Left  Result  Date: 06/07/2017 CLINICAL DATA:  Left lower extremity edema. EXAM: LEFT LOWER EXTREMITY VENOUS DOPPLER ULTRASOUND TECHNIQUE: Gray-scale sonography with graded compression, as well as color Doppler and duplex ultrasound were performed to evaluate the lower extremity deep venous systems from the level of the common femoral vein and including the common femoral, femoral, profunda femoral, popliteal and calf veins including the posterior tibial, peroneal and gastrocnemius veins when visible. The superficial great saphenous vein was also interrogated. Spectral Doppler was utilized to evaluate flow at rest and with distal augmentation maneuvers in the common femoral, femoral and popliteal veins. COMPARISON:  09/10/2013 FINDINGS: Contralateral Common Femoral Vein: Respiratory phasicity is normal and symmetric with the symptomatic side. No evidence of thrombus. Normal compressibility. Common Femoral Vein: No evidence of thrombus. Normal compressibility, respiratory phasicity and response to augmentation. Saphenofemoral Junction: No evidence of thrombus. Normal compressibility and flow on color Doppler imaging. Profunda Femoral Vein: No evidence of thrombus. Normal compressibility and flow on color Doppler imaging. Femoral Vein: No evidence of thrombus. Normal compressibility, respiratory phasicity and response to augmentation. Popliteal Vein: No evidence of thrombus. Normal compressibility, respiratory phasicity and response to augmentation. Calf Veins: No evidence of thrombus. Normal compressibility and flow on color Doppler imaging. Superficial Great Saphenous Vein: No evidence of thrombus. Normal  compressibility. Venous Reflux:  None. Other Findings: No evidence of superficial thrombophlebitis or abnormal fluid collection. IMPRESSION: No evidence of left lower extremity deep venous thrombosis. Electronically Signed   By: Irish Lack M.D.   On: 06/07/2017 17:12   Dg Chest Portable 1 View  Result Date: 06/07/2017 CLINICAL DATA:  Shortness of breath, body aches, cough and drowsiness for a few days. Smoker. EXAM: PORTABLE CHEST 1 VIEW COMPARISON:  CT chest and PA and lateral chest 09/29/2016. FINDINGS: There cardiomegaly and interstitial edema. Left pleural effusion and basilar airspace disease are seen. No pneumothorax. IMPRESSION: Cardiomegaly and interstitial edema. Left pleural effusion and basilar airspace disease which could be due to atelectasis or pneumonia. Electronically Signed   By: Drusilla Kanner M.D.   On: 06/07/2017 11:43    ECG & Cardiac Imaging    Repeat ECG Pending _____________   2D Echocardiogram 06/08/2016   EF 65-70%, no rwma, nl RV fxn. _____________   Assessment & Plan    1.  Acute on chronic resp failure/AECOPD:  Pt with a long h/o tob abuse presented 1/24 with worsening dyspnea, hypoxia, and resp acidosis.  CXR showed interstitial edema with concern for LLL PNA.  She has been treated with IV abx (ceftriaxone/azithromycin), nebs, and steroids.  F/u CTA chest neg for PE  ongoing interstitial edema.  She remains on O2 via [place] @ 6lpm.  Says that her O2 sats have always been in the high 80's to low 90's before. Resp failure most likely multifactorial given hx and evidence of edema.  Cont abx/nebs/steroids per IM.  Agree with IV diuresis, though exam is difficult due to body habitus.  Will have to watch BUN/Creat/Bicarb closely.  If symptoms/hypoxia persists despite adequate diuresis and treatment of resp failure, would consider right and left heart cath this admission.   2.  Pulmonary edema:  As above, noted on CXR and CTA chest.  Echo 1/25 showed nl LV/RV fxn.  Exam  limited by body habitus, though no obvious volume overload.  Cont diuresis.  We will consider R & L heart cath if condition does not improve, in order to better assess filling pressures and r/o ischemic heart dzs in the setting of recent complaints of  dull chest pain.  3.  Chest pain:  This started in December 2018 - dull aching discomfort @ rest, typically lasts 3-5 mins and resolves spontaneously.  No exertional symptoms.  She was previously set up for stress test @ [facility] but had to cancel.  Trop nl.  Echo w/ nl LV/RV fxn.  F/u ecg pending.  As above, will need some form of ischemic eval however, stress testing not likely to be fruitful secondary to body habitus.  Will consider inpt vs outpt cath pending recovery from resp failure.  Cont ASA.  4.  Essential HTN: stable.  Recently started on antihypertensive medication as an outpt.  With h/o dependent edema, amlodipine may not be best option.  Could consider ARB and/or spironolactone with high suspicion for PAH as well.  5.  Tob Abuse:  Cessation advised.  See #1.  6.  Probable OSA:  Needs outpt sleep study.   Signed, Nicolasa Ducking, NP 06/11/2017, 10:09 AM  For questions or updates, please contact   Please consult [email or web address] for contact info under Cardiology/STEMI.

## 2017-06-12 ENCOUNTER — Telehealth: Payer: Self-pay | Admitting: *Deleted

## 2017-06-12 DIAGNOSIS — E662 Morbid (severe) obesity with alveolar hypoventilation: Secondary | ICD-10-CM

## 2017-06-12 DIAGNOSIS — J9601 Acute respiratory failure with hypoxia: Secondary | ICD-10-CM

## 2017-06-12 LAB — CULTURE, BLOOD (ROUTINE X 2)
Culture: NO GROWTH
Culture: NO GROWTH
SPECIAL REQUESTS: ADEQUATE
Special Requests: ADEQUATE

## 2017-06-12 LAB — BASIC METABOLIC PANEL
ANION GAP: 11 (ref 5–15)
BUN: 22 mg/dL — ABNORMAL HIGH (ref 6–20)
CHLORIDE: 83 mmol/L — AB (ref 101–111)
CO2: 39 mmol/L — AB (ref 22–32)
Calcium: 9.2 mg/dL (ref 8.9–10.3)
Creatinine, Ser: 0.77 mg/dL (ref 0.44–1.00)
GFR calc non Af Amer: >60 mL/min (ref >60)
GLUCOSE: 186 mg/dL — AB (ref 65–99)
POTASSIUM: 4.2 mmol/L (ref 3.5–5.1)
Sodium: 133 mmol/L — ABNORMAL LOW (ref 135–145)

## 2017-06-12 LAB — CBC
HEMATOCRIT: 57.2 % — AB (ref 35.0–47.0)
HEMOGLOBIN: 17.8 g/dL — AB (ref 12.0–16.0)
MCH: 24.9 pg — ABNORMAL LOW (ref 26.0–34.0)
MCHC: 31.2 g/dL — ABNORMAL LOW (ref 32.0–36.0)
MCV: 79.7 fL — AB (ref 80.0–100.0)
Platelets: 272 10*3/uL (ref 150–440)
RBC: 7.17 MIL/uL — AB (ref 3.80–5.20)
RDW: 17.8 % — ABNORMAL HIGH (ref 11.5–14.5)
WBC: 10.4 10*3/uL (ref 3.6–11.0)

## 2017-06-12 MED ORDER — AMOXICILLIN-POT CLAVULANATE 875-125 MG PO TABS
1.0000 | ORAL_TABLET | Freq: Two times a day (BID) | ORAL | Status: DC
  Administered 2017-06-12: 16:00:00 1 via ORAL
  Filled 2017-06-12: qty 1

## 2017-06-12 MED ORDER — ALBUTEROL SULFATE HFA 108 (90 BASE) MCG/ACT IN AERS: 2.0000 | INHALATION_SPRAY | Freq: Four times a day (QID) | RESPIRATORY_TRACT | 2 refills | Status: AC | PRN

## 2017-06-12 MED ORDER — AMOXICILLIN-POT CLAVULANATE 875-125 MG PO TABS: 1.0000 | ORAL_TABLET | Freq: Two times a day (BID) | ORAL | 0 refills | Status: AC

## 2017-06-12 MED ORDER — PREDNISONE 10 MG (21) PO TBPK: 10.0000 mg | ORAL_TABLET | Freq: Every day | ORAL | 0 refills | Status: DC

## 2017-06-12 MED ORDER — SENNOSIDES-DOCUSATE SODIUM 8.6-50 MG PO TABS: 1.0000 | ORAL_TABLET | Freq: Every evening | ORAL | Status: DC | PRN

## 2017-06-12 MED ORDER — FUROSEMIDE 20 MG PO TABS: 20.0000 mg | ORAL_TABLET | Freq: Two times a day (BID) | ORAL | 0 refills | Status: DC

## 2017-06-12 MED ORDER — FLUTICASONE-SALMETEROL 250-50 MCG/DOSE IN AEPB: 1.0000 | INHALATION_SPRAY | Freq: Two times a day (BID) | RESPIRATORY_TRACT | 0 refills | Status: DC

## 2017-06-12 NOTE — Progress Notes (Signed)
PT Cancellation Note  Patient Details Name: [name] MRN: [id number] DOB: 1965/12/02   Cancelled Treatment:    Reason Eval/Treat Not Completed: Other (comment). Consult received and chart reviewed. Per pt, she is independent with mobility, dressed and ready to dc this date. RN has already qualified for home O2. No needs identified per CM. Will dc in house at this time.    [name] 06/12/2017, 3:02 PM  Elizabeth PalauStephanie Nadya Hopwood, PT, DPT [phone]

## 2017-06-12 NOTE — Consult Note (Addendum)
[facility] Pulmonary Medicine Consultation      Assessment and Plan:  Acute hypoxic respiratory failure secondary to acute pulmonary edema volume overload. - Continue diuresis. -Low-salt diet.  Chronic hypoxic respiratory failure complicated by pulmonary hypertension, erythrocytosis.  Severe morbid obesity with suspected obesity hypoventilation syndrome. -The patient's oxygen saturation is low normal to slightly below normal at rest on room air.  She has severe morbid obesity with likely severe underlying obstructive lung disease, as well as possibly obesity hypoventilation which are contributing to the above conditions. - I discussed this with her, weight loss is going to be very important going forward, in addition we will need to keep her on oxygen and test her for obstructive sleep apnea, and start CPAP soon as possible if indicated. -We will arrange follow-up with me in 1 week in the outpatient setting.  Nicotine abuse with chronic bronchitis, possible pneumonia. -Discussed the importance of smoking cessation. -Taper steroids, continue antibiotics to complete course.  Excessive daytime sleepiness.  --Symptoms and signs of OSA, ESS of 12.  --Will send for sleep study.    Date: 06/12/2017  MRN# [id number] [name] [birth year]  Referring Physician:   MARTI [name] is a 52 y.o. old female seen in consultation for chief complaint of:    Chief Complaint  Patient presents with  . Shortness of Breath  . Cough  . Fatigue  . Facial Swelling    HPI:  The patient is a 52 year old female with history of COPD, tobacco dependence, she was admitted to the hospital on 06/07/17 with acute shortness of breath, and progressive lower extremity edema. She then underwent an echocardiogram, she previously had an echocardiogram on 09/10/2013, which showed diastolic dysfunction and mild left ventricular hypertrophy with normal RVSP.  Results of her most recent echocardiogram in 06/08/17 are  not yet available. Initial testing including influenza PCR was negative, CBC was significant for thrombocytosis with WBC count of 17.2, hemoglobin of 16.4. BNP was elevated at 281 (morbid obesity)  PMHX:   Past Medical History:  Diagnosis Date  . Anxiety   . COPD (chronic obstructive pulmonary disease) (HCC)   . Essential hypertension   . History of echocardiogram    a. 08/2013 Echo: EF 55-60%, impaired LV relaxation, mild LVH, nl RV fxn, nl RVSP, mild TR; b. 05/2017 Echo: EF 65-70%, no rwma, nl RV fxn.  . Morbid obesity (HCC)   . Tobacco abuse    Surgical Hx:  Past Surgical History:  Procedure Laterality Date  . ABDOMINAL HYSTERECTOMY    . CHOLECYSTECTOMY    . HERNIA REPAIR     Family Hx:  Family History  Problem Relation Age of Onset  . Dementia Mother   . Heart attack Father    Social Hx:   Social History   Tobacco Use  . Smoking status: Current Every Day Smoker    Packs/day: 0.75    Years: 40.00    Pack years: 30.00    Types: Cigarettes  . Smokeless tobacco: Never Used  Substance Use Topics  . Alcohol use: No  . Drug use: No   Medication:    Current Facility-Administered Medications:  .  0.9 %  sodium chloride infusion, 250 mL, Intravenous, PRN, Juliene Pina, Sital, MD .  acetaminophen (TYLENOL) tablet 650 mg, 650 mg, Oral, Q6H PRN, 650 mg at 06/08/17 1125 **OR** acetaminophen (TYLENOL) suppository 650 mg, 650 mg, Rectal, Q6H PRN, Mody, Sital, MD .  amLODipine (NORVASC) tablet 5 mg, 5 mg, Oral, Daily, Adrian Saran, MD,  5 mg at 06/11/17 0850 .  aspirin EC tablet 81 mg, 81 mg, Oral, Daily, Mody, Sital, MD, 81 mg at 06/12/17 0847 .  bisacodyl (DULCOLAX) EC tablet 5 mg, 5 mg, Oral, Daily PRN, Mody, Sital, MD .  cefTRIAXone (ROCEPHIN) 1 g in dextrose 5 % 50 mL injection, , Intravenous, Q24H, Mody, Sital, MD, Last Rate: 0 mL/hr at 06/09/17 1937 .  chlorhexidine (PERIDEX) 0.12 % solution 15 mL, 15 mL, Mouth Rinse, BID, Mody, Sital, MD, 15 mL at 06/12/17 0846 .  clonazePAM  (KLONOPIN) tablet 0.5 mg, 0.5 mg, Oral, TID PRN, Mody, Sital, MD .  enoxaparin (LOVENOX) injection 40 mg, 40 mg, Subcutaneous, Q12H, Gouru, Aruna, MD, 40 mg at 06/12/17 0846 .  furosemide (LASIX) injection 20 mg, 20 mg, Intravenous, Q12H, Gouru, Aruna, MD, 20 mg at 06/12/17 0515 .  guaiFENesin-dextromethorphan (ROBITUSSIN DM) 100-10 MG/5ML syrup 5 mL, 5 mL, Oral, Q4H PRN, Gouru, Aruna, MD, 5 mL at 06/10/17 2252 .  ipratropium-albuterol (DUONEB) 0.5-2.5 (3) MG/3ML nebulizer solution 3 mL, 3 mL, Nebulization, TID, Gouru, Aruna, MD, 3 mL at 06/12/17 1411 .  MEDLINE mouth rinse, 15 mL, Mouth Rinse, q12n4p, Mody, Sital, MD, 15 mL at 06/11/17 1817 .  methylPREDNISolone sodium succinate (SOLU-MEDROL) 40 mg/mL injection 40 mg, 40 mg, Intravenous, Q12H, Gouru, Aruna, MD, 40 mg at 06/12/17 0515 .  mometasone-formoterol (DULERA) 200-5 MCG/ACT inhaler 2 puff, 2 puff, Inhalation, BID, Mody, Sital, MD, 2 puff at 06/12/17 0800 .  ondansetron (ZOFRAN) tablet 4 mg, 4 mg, Oral, Q6H PRN **OR** ondansetron (ZOFRAN) injection 4 mg, 4 mg, Intravenous, Q6H PRN, Mody, Sital, MD .  senna-docusate (Senokot-S) tablet 1 tablet, 1 tablet, Oral, QHS PRN, Adrian Saran, MD, 1 tablet at 06/09/17 2017 .  sodium chloride flush (NS) 0.9 % injection 3 mL, 3 mL, Intravenous, Q12H, Mody, Sital, MD, 3 mL at 06/12/17 0849   Allergies:  Patient has no known allergies.  Review of Systems: Gen:  Denies  fever, sweats, chills HEENT: Denies blurred vision, double vision. bleeds, sore throat Cvc:  No dizziness, chest pain. Resp:   Denies cough or sputum production, shortness of breath Gi: Denies swallowing difficulty, stomach pain. Gu:  Denies bladder incontinence, burning urine Ext:   No Joint pain, stiffness. Skin: No skin rash,  hives  Endoc:  No polyuria, polydipsia. Psych: No depression, insomnia. Other:  All other systems were reviewed with the patient and were negative other that what is mentioned in the HPI.   Physical  Examination:   VS: BP 110/62   Pulse 79   Temp 98.1 F (36.7 C) (Oral)   Resp 18   Ht 5' (1.524 m)   Wt 262 lb 6.4 oz (119 kg)   SpO2 90%   BMI 51.25 kg/m   General Appearance: No distress  Neuro:without focal findings,  speech normal,  HEENT: PERRLA, EOM intact.   Pulmonary: normal breath sounds, No wheezing.  CardiovascularNormal S1,S2.  No m/r/g.   Abdomen: Benign, Soft, non-tender. Renal:  No costovertebral tenderness  GU:  No performed at this time. Endoc: No evident thyromegaly, no signs of acromegaly. Skin:   warm, no rashes, no ecchymosis  Extremities: normal, no cyanosis, clubbing.  Other findings:    LABORATORY PANEL:   CBC Recent Labs  Lab 06/12/17 0356  WBC 10.4  HGB 17.8*  HCT 57.2*  PLT 272   ------------------------------------------------------------------------------------------------------------------  Chemistries  Recent Labs  Lab 06/07/17 1055  06/12/17 0356  NA 134*   < > 133*  K 4.7   < >  4.2  CL 94*   < > 83*  CO2 32   < > 39*  GLUCOSE 114*   < > 186*  BUN 10   < > 22*  CREATININE 0.87   < > 0.77  CALCIUM 9.1   < > 9.2  AST 20  --   --   ALT 23  --   --   ALKPHOS 92  --   --   BILITOT 1.0  --   --    < > = values in this interval not displayed.   ------------------------------------------------------------------------------------------------------------------  Cardiac Enzymes Recent Labs  Lab 06/07/17 1055  TROPONINI <0.03   ------------------------------------------------------------  RADIOLOGY:  No results found.     Thank  you for the consultation and for allowing [facility] to assist in the care of your patient. Our recommendations are noted above.  Please contact us if we can be of further service.   Wells Guileseep Skylie Hiott, MD.  Board Certified in Internal Medicine, Pulmonary Medicine, Critical Care Medicine, and Sleep Medicine.  [facility] Office Number: [phone]  Santiago Gladavid Kasa, M.D.  Billy Fischeravid Simonds, M.D  06/12/2017

## 2017-06-12 NOTE — Telephone Encounter (Signed)
Admitted

## 2017-06-12 NOTE — Telephone Encounter (Signed)
-----   Message from Coralee RudSabrina F Gilley sent at 06/12/2017  3:34 PM EST ----- Regarding: tcm/ph 2/5 3:40 Eula Listenyan dunn, PA

## 2017-06-12 NOTE — Progress Notes (Signed)
[facility]         [place], [place]   06/12/2017  Patient: [name]   Date of Birth:  April 09, 1966  Date of admission:  06/07/2017  Date of Discharge  06/12/2017    To Whom it May Concern:   [name] is in the hospital.  Her daughter [name] has accompanied her during the entire hospital course, she may return to work on 06/13/2017.  PHYSICAL ACTIVITY:  Full  If you have any questions or concerns, please don't hesitate to call.  Sincerely,   Ramonita LabAruna Seraphim Trow M.D Pager [phone] Office : [phone]   .

## 2017-06-12 NOTE — Progress Notes (Signed)
SATURATION QUALIFICATIONS: (This note is used to comply with regulatory documentation for home oxygen)  Patient Saturations on Room Air at Rest = 88%  Patient Saturations on Room Air while Ambulating = 80%  Patient Saturations on 4 Liters of oxygen while Ambulating = 94%  Please briefly explain why patient needs home oxygen: COPD

## 2017-06-12 NOTE — Discharge Instructions (Addendum)
Continue oxygen via nasal cannula Follow-up with primary care physician in 5-7 days  follow-up with pulmonology Dr. Nicholos Johnsamachandran in 1 weeks.  Patient needs outpatient sleep study Follow-up with cardiology dr.Arida in 2 weeks for cardiac cath

## 2017-06-12 NOTE — Progress Notes (Signed)
Pt ready for discharge. No resp distress. 02 in use at 4 l [place]. Waiting for o2 tank to be delivered to pts room. Instructions  Given per k. Young rn. No voiced c/o.

## 2017-06-12 NOTE — Care Management Note (Addendum)
Case Management Note  Patient Details  Name: [name] MRN: [id number] Date of Birth: 1965-10-25  Subjective/Objective:                  Ad[facility] with the diagnosis of pneumonia. Lives with family. Last seen Dr. Bari Edwardaaleman 1st of December 2018 at [facility]. Pres[facility] at [facility] on [address].  Takes care of all basic activities of daily living herself.  States she receives charity care in [place].  Action/Plan: Qualifies for home oxygen. Angel GottronJason [name], [facility] representative updated.   Expected Discharge Date:  06/12/17               Expected Discharge Plan:     In-House Referral:   yes  Discharge planning Services     Post Acute Care Choice:    Choice offered to:     DME Arranged:   yes DME Agency:   [facility]  HH Arranged:    HH Agency:     Status of Service:     If discussed at Long Length of Stay Meetings, dates discussed:    Additional Comments:  Angel GreetBrenda S Christpoher Sievers, RN MSN CCM Care Management [phone] 06/12/2017, 3:15 PM

## 2017-06-12 NOTE — Discharge Summary (Signed)
[place] [facility] [place] NAME: [name]    MR#:  [id number]  DATE OF BIRTH:  07-Jul-1965  DATE OF ADMISSION:  06/07/2017 ADMITTING PHYSICIAN: Bettey Costa, MD  DATE OF DISCHARGE:  06/12/17   PRIMARY CARE PHYSICIAN: Gennaro Africa, MD    ADMISSION DIAGNOSIS:  Hypoxia [R09.02] Edema, unspecified type [R60.9] Community acquired pneumonia of left lower lobe of lung ([place]) [J18.1]  DISCHARGE DIAGNOSIS:  Active Problems:   PNA (pneumonia) Obstructive sleep apnea Acute diastolic CHF Pickwickian syndrome Morbid obesity  SECONDARY DIAGNOSIS:   Past Medical History:  Diagnosis Date  . Anxiety   . COPD (chronic obstructive pulmonary disease) ([place])   . Essential hypertension   . History of echocardiogram    a. 08/2013 Echo: EF 55-60%, impaired LV relaxation, mild LVH, nl RV fxn, nl RVSP, mild TR; b. 05/2017 Echo: EF 65-70%, no rwma, nl RV fxn.  . Morbid obesity ([place])   . Tobacco abuse     HOSPITAL COURSE:  HPI  [name]  is a 52 y.o. female with a known history of COPD and tobacco dependence who presents with above complaint. Patient with over the past several days she has had increasing shortness of breath, wheezing and cough with purulent sputum. Her daughter has noticed left lower extremity swelling. She denies PND, orthopnea. She denies chest pain. She denies fevers or chills. Chest x-ray shows left lower lobe pneumonia When she came to the ER she had diffuse wheezing and was found to be hypoxic  #.Acute hypoxic respiratory failure in the setting of COPD exacerbation and community-acquired pneumonia Patient presents with leukocytosis, tachypnea and tachycardia-met septic criteria at the time of admission Wean oxygen as tolerated, currently on 5-6 lit of o2  CT angiogram negative for pulmonary embolism   #Acute CHF -diastolic with pulmonary edema, lower extremity edema cardiomegaly and pulmonary arterial  hypertension Echocardiogram with normal ejection fraction Lasix IV every 12 hours Continue aspirin.  LDL 78 Patient not on beta-blocker in view of acute COPD. [place] cardiology is following, agreeable with the current plan of care and will consider right and left heart  catheterization if no clinical improvement , outpatient follow-up is recommended Daily weights, intake and output DVT ruled out with negative left leg venous Dopplers  #Acute COPD exacerbation:  IV steroids tapering as clinically permits Continue nebs and inhalers  #. Community-acquired pneumonia: Continue Rocephin and azithromycin  Azithromycin dose completed  #.Tobacco dependence: Patient is encouraged to quit smoking. Counseling was provided for 4 minutes.  #morbid obesity: Patient encouraged to lose weight as tolerated. Patient would benefit from outpatient sleep evaluation to diagnose underlying sleep apnea.  Outpatient pulmonology follow-up  #Myoclonic jerks versus twitching-neurology consulted.  Recommending Klonopin as needed for involuntary movements B12 and TSH are normal     DISCHARGE CONDITIONS:   Stable  CONSULTS OBTAINED:  Treatment Team:  Alexis Goodell, MD Allyne Gee, MD Laverle Hobby, MD   PROCEDURES  None   DRUG ALLERGIES:  No Known Allergies  DISCHARGE MEDICATIONS:   Allergies as of 06/12/2017   No Known Allergies     Medication List    TAKE these medications   albuterol 108 (90 Base) MCG/ACT inhaler Commonly known as:  PROVENTIL HFA;VENTOLIN HFA Inhale 2 puffs into the lungs every 6 (six) hours as needed for wheezing or shortness of breath.   amitriptyline 10 MG tablet Commonly known as:  ELAVIL Take 10 mg by mouth at bedtime as needed.  amLODipine 5 MG tablet Commonly known as:  NORVASC Take 5 mg by mouth daily.   amoxicillin-clavulanate 875-125 MG tablet Commonly known as:  AUGMENTIN Take 1 tablet by mouth every 12 (twelve) hours for 5 doses.    aspirin EC 81 MG tablet Take 81 mg by mouth daily.   clonazePAM 0.5 MG tablet Commonly known as:  KLONOPIN Take 0.5 mg by mouth 3 (three) times daily as needed.   cyclobenzaprine 10 MG tablet Commonly known as:  FLEXERIL Take 10 mg by mouth 3 (three) times daily as needed.   Fluticasone-Salmeterol 250-50 MCG/DOSE Aepb Commonly known as:  ADVAIR DISKUS Inhale 1 puff into the lungs 2 (two) times daily.   furosemide 20 MG tablet Commonly known as:  LASIX Take 1 tablet (20 mg total) by mouth 2 (two) times daily.   naproxen sodium 220 MG tablet Commonly known as:  ALEVE Take 220 mg by mouth daily as needed.   nitroGLYCERIN 0.4 MG SL tablet Commonly known as:  NITROSTAT Place 0.4 mg under the tongue as needed.   predniSONE 10 MG (21) Tbpk tablet Commonly known as:  STERAPRED UNI-PAK 21 TAB Take 1 tablet (10 mg total) by mouth daily. Take 6 tablets by mouth for 1 day followed by  5 tablets by mouth for 1 day followed by  4 tablets by mouth for 1 day followed by  3 tablets by mouth for 1 day followed by  2 tablets by mouth for 1 day followed by  1 tablet by mouth for a day and stop   senna-docusate 8.6-50 MG tablet Commonly known as:  Senokot-S Take 1 tablet by mouth at bedtime as needed for mild constipation.            Durable Medical Equipment  (From admission, onward)        Start     Ordered   06/12/17 1505  For home use only DME oxygen  Once    Question Answer Comment  Mode or (Route) Nasal cannula   Liters per Minute 4   Frequency Continuous (stationary and portable oxygen unit needed)   Oxygen conserving device Yes   Oxygen delivery system Gas      06/12/17 1505       DISCHARGE INSTRUCTIONS:   Continue oxygen via nasal cannula Follow-up with primary care physician in 5-7 days  follow-up with pulmonology Dr. Ashby Dawes in 1 weeks.  Patient needs outpatient sleep study Follow-up with cardiology dr.Arida in 2 weeks for cardiac cath   DIET:   Cardiac diet  DISCHARGE CONDITION:  Fair  ACTIVITY:  Activity as tolerated  OXYGEN:  Home Oxygen: Yes.     Oxygen Delivery: 4 liters/min via Patient connected to nasal cannula oxygen  DISCHARGE LOCATION:  home   If you experience worsening of your admission symptoms, develop shortness of breath, life threatening emergency, suicidal or homicidal thoughts you must seek medical attention immediately by calling 911 or calling your MD immediately  if symptoms less severe.  You Must read complete instructions/literature along with all the possible adverse reactions/side effects for all the Medicines you take and that have been prescribed to you. Take any new Medicines after you have completely understood and accpet all the possible adverse reactions/side effects.   Please note  You were cared for by a hospitalist during your hospital stay. If you have any questions about your discharge medications or the care you received while you were in the hospital after you are discharged, you can call the  unit and asked to speak with the hospitalist on call if the hospitalist that took care of you is not available. Once you are discharged, your primary care physician will handle any further medical issues. Please note that NO REFILLS for any discharge medications will be authorized once you are discharged, as it is imperative that you return to your primary care physician (or establish a relationship with a primary care physician if you do not have one) for your aftercare needs so that they can reassess your need for medications and monitor your lab values.     Today  Chief Complaint  Patient presents with  . Shortness of Breath  . Cough  . Fatigue  . Facial Swelling    Patient is feeling much better.  Shortness of breath improved.  Wants to go home.  Will arrange home oxygen.  Patient qualified for 4 L of home O2 ROS:  CONSTITUTIONAL: Denies fevers, chills. Denies any fatigue, weakness.   EYES: Denies blurry vision, double vision, eye pain. EARS, NOSE, THROAT: Denies tinnitus, ear pain, hearing loss. RESPIRATORY: Denies cough, wheeze, shortness of breath.  CARDIOVASCULAR: Denies chest pain, palpitations, edema.  GASTROINTESTINAL: Denies nausea, vomiting, diarrhea, abdominal pain. Denies bright red blood per rectum. GENITOURINARY: Denies dysuria, hematuria. ENDOCRINE: Denies nocturia or thyroid problems. HEMATOLOGIC AND LYMPHATIC: Denies easy bruising or bleeding. SKIN: Denies rash or lesion. MUSCULOSKELETAL: Denies pain in neck, back, shoulder, knees, hips or arthritic symptoms.  NEUROLOGIC: Denies paralysis, paresthesias.  PSYCHIATRIC: Denies anxiety or depressive symptoms.   VITAL SIGNS:  Blood pressure 110/62, pulse 79, temperature 98.1 F (36.7 C), temperature source Oral, resp. rate 18, height 5' (1.524 m), weight 119 kg (262 lb 6.4 oz), SpO2 90 %.  I/O:    Intake/Output Summary (Last 24 hours) at 06/12/2017 1528 Last data filed at 06/12/2017 1300 Gross per 24 hour  Intake 720 ml  Output -  Net 720 ml    PHYSICAL EXAMINATION:  GENERAL:  52 y.o.-year-old patient lying in the bed with no acute distress.  EYES: Pupils equal, round, reactive to light and accommodation. No scleral icterus. Extraocular muscles intact.  HEENT: Head atraumatic, normocephalic. Oropharynx and nasopharynx clear.  NECK:  Supple, no jugular venous distention. No thyroid enlargement, no tenderness.  LUNGS: Normal breath sounds bilaterally, no wheezing, rales,rhonchi or crepitation. No use of accessory muscles of respiration.  CARDIOVASCULAR: S1, S2 normal. No murmurs, rubs, or gallops.  ABDOMEN: Soft, non-tender, non-distended. Bowel sounds present. No organomegaly or mass.  EXTREMITIES: No pedal edema, cyanosis, or clubbing.  NEUROLOGIC: Cranial nerves II through XII are intact. Muscle strength 5/5 in all extremities. Sensation intact. Gait not checked.  PSYCHIATRIC: The patient is  alert and oriented x 3.  SKIN: No obvious rash, lesion, or ulcer.   DATA REVIEW:   CBC Recent Labs  Lab 06/12/17 0356  WBC 10.4  HGB 17.8*  HCT 57.2*  PLT 272    Chemistries  Recent Labs  Lab 06/07/17 1055  06/12/17 0356  NA 134*   < > 133*  K 4.7   < > 4.2  CL 94*   < > 83*  CO2 32   < > 39*  GLUCOSE 114*   < > 186*  BUN 10   < > 22*  CREATININE 0.87   < > 0.77  CALCIUM 9.1   < > 9.2  AST 20  --   --   ALT 23  --   --   ALKPHOS 92  --   --  BILITOT 1.0  --   --    < > = values in this interval not displayed.    Cardiac Enzymes Recent Labs  Lab 06/07/17 1055  TROPONINI <0.03    Microbiology Results  Results for orders placed or performed during the hospital encounter of 06/07/17  Blood Culture (routine x 2)     Status: None   Collection Time: 06/07/17 10:31 AM  Result Value Ref Range Status   Specimen Description BLOOD RIGHT HAND  Final   Special Requests   Final    BOTTLES DRAWN AEROBIC AND ANAEROBIC Blood Culture adequate volume   Culture   Final    NO GROWTH 5 DAYS Performed at [facility], [address], [place], [place] [address]    Report Status 06/12/2017 FINAL  Final  Blood Culture (routine x 2)     Status: None   Collection Time: 06/07/17 11:08 AM  Result Value Ref Range Status   Specimen Description BLOOD LEFT HAND  Final   Special Requests   Final    BOTTLES DRAWN AEROBIC AND ANAEROBIC Blood Culture adequate volume   Culture   Final    NO GROWTH 5 DAYS Performed at [facility], [address], [place],  [address]    Report Status 06/12/2017 FINAL  Final    RADIOLOGY:  Dg Chest 2 View  Result Date: 06/10/2017 CLINICAL DATA:  52 year old female with a history of shortness of breath EXAM: CHEST  2 VIEW COMPARISON:  06/07/2017, 09/29/2016, CT chest 09/29/2016 FINDINGS: Cardiomediastinal silhouette unchanged with cardiomegaly. Central vascular congestion. Diffuse interlobular septal thickening with  interstitial opacities. No pneumothorax. Lateral view demonstrates blunting of the costophrenic sulcus. IMPRESSION: Similar appearance of the chest x-ray with mixed interlobular septal thickening and interstitial opacities, with small pleural effusion. Leading differential diagnosis is congestive heart failure, although it is difficult to exclude atypical infection. Electronically Signed   By: Corrie Mckusick D.O.   On: 06/10/2017 10:01   Ct Angio Chest Pe W Or Wo Contrast  Result Date: 06/10/2017 CLINICAL DATA:  Decreased oxygen saturation today.  Smoker. EXAM: CT ANGIOGRAPHY CHEST WITH CONTRAST TECHNIQUE: Multidetector CT imaging of the chest was performed using the standard protocol during bolus administration of intravenous contrast. Multiplanar CT image reconstructions and MIPs were obtained to evaluate the vascular anatomy. CONTRAST:  75 ml ISOVUE-370 IOPAMIDOL (ISOVUE-370) INJECTION 76% COMPARISON:  CT chest 09/29/2016 and 09/09/2013. PA and lateral chest earlier today and 01/03/2016. FINDINGS: Cardiovascular: No pulmonary embolus is identified. There is cardiomegaly. No pericardial effusion. The pulmonary arteries are prominent suggestive of pulmonary arterial hypertension. A few scattered calcific aortic and coronary atherosclerotic calcifications are identified. Mediastinum/Nodes: Prevascular node measuring 1.4 cm on image 27 is unchanged since 2015. Left hilar node on image 44 measures 1.7 cm short axis dimension, increased from 0.6 cm on the most recent exam. There is also a subcarinal lymph node measuring 1.4 cm on image 42 which is new since the most recent exam. Thyroid gland, trachea, and esophagus demonstrate no significant findings. Lungs/Pleura: Small left pleural effusion is identified. There is a trace right effusion. The lungs are emphysematous. There is diffuse ground-glass attenuation throughout the lungs. A pulmonary nodule in the right middle lobe on image 45 measures 0.5 x 0.6 cm,  unchanged to slightly smaller than on the comparison exams. No consolidative process is seen. Upper Abdomen: No acute abnormality. Musculoskeletal: No focal bony abnormality. Review of the MIP images confirms the above findings. IMPRESSION: Negative for pulmonary embolus.  Diffuse ground-glass attenuation throughout the lungs is likely due to pulmonary edema given small left pleural effusion and marked cardiomegaly. 0.6 cm pulmonary nodule is unchanged since 2015 consistent with benignity. No follow-up imaging is recommended. Some increase in mediastinal and hilar lymphadenopathy since 09/29/2016 is presumably reactive. Prominence of the pulmonary arteries worrisome for pulmonary arterial hypertension. Aortic Atherosclerosis (ICD10-I70.0) and Emphysema (ICD10-J43.9). Electronically Signed   By: Inge Rise M.D.   On: 06/10/2017 13:07    EKG:   Orders placed or performed during the hospital encounter of 06/07/17  . ED EKG  . ED EKG  . EKG 12-Lead  . EKG 12-Lead  . EKG  . EKG 12-Lead  . EKG 12-Lead      Management plans discussed with the patient, family and they are in agreement.  CODE STATUS:     Code Status Orders  (From admission, onward)        Start     Ordered   06/07/17 1348  Full code  Continuous     06/07/17 1347    Code Status History    Date Active Date Inactive Code Status Order ID Comments User Context   This patient has a current code status but no historical code status.      TOTAL TIME TAKING CARE OF THIS PATIENT: 43 minutes.   Note: This dictation was prepared with Dragon dictation along with smaller phrase technology. Any transcriptional errors that result from this process are unintentional.   _0 @  on 06/12/2017 at 3:28 PM  Between 7am to 6pm - Pager - [phone]  After 6pm go to [email or web address] - password [email or web address] [facility]  Office  [phone]  CC: Primary care physician; Gennaro Africa, MD

## 2017-06-13 NOTE — Telephone Encounter (Signed)
Patient contacted regarding discharge [facility] on 06/12/17.  Patient understands to follow up with provider Eula Listenyan Dunn PA on 06/19/17 at 3:40 pm at [facility]. Patient understands discharge instructions? Yes Patient understands medications and regiment? Yes Patient understands to bring all medications to this visit? Yes  Spoke with patient and she denies any further questions or concerns.

## 2017-06-18 NOTE — Progress Notes (Deleted)
Acute hypoxic respiratory failure secondary to acute pulmonary edema volume overload. - Continue diuresis. -Low-salt diet.  Chronic hypoxic respiratory failure complicated by pulmonary hypertension, erythrocytosis.  Severe morbid obesity with suspected obesity hypoventilation syndrome. -The patient's oxygen saturation is low normal to slightly below normal at rest on room air.  She has severe morbid obesity with likely severe underlying obstructive lung disease, as well as possibly obesity hypoventilation which are contributing to the above conditions. - I discussed this with her, weight loss is going to be very important going forward, in addition we will need to keep her on oxygen and test her for obstructive sleep apnea, and start CPAP soon as possible if indicated. -We will arrange follow-up with me in 1 week in the outpatient setting.  Nicotine abuse with chronic bronchitis, possible pneumonia. -Discussed the importance of smoking cessation. -Taper steroids, continue antibiotics to complete course.  Excessive daytime sleepiness.  --Symptoms and signs of OSA, ESS of 12.  --Will send for sleep study.   The patient is a 52 year old female with history of COPD, tobacco dependence, she was admitted to the hospital on 06/07/17 with acute shortness of breath, and progressive lower extremity edema. She was thought to have obstructive sleep apnea and possible obesity hypoventilation syndrome. She then underwent an echocardiogram, she previously had an echocardiogram on 09/10/2013, which showed diastolic dysfunction and mild left ventricular hypertrophy with normal RVSP.  Results of her most recent echocardiogram in 06/08/17 are still pending. Initial testing including influenza PCR was negative, CBC was significant for thrombocytosis with WBC count of 17.2, hemoglobin of 16.4. BNP was elevated at 281 (morbid obesity)

## 2017-06-19 ENCOUNTER — Inpatient Hospital Stay: Payer: Self-pay | Admitting: Internal Medicine

## 2017-06-19 ENCOUNTER — Ambulatory Visit: Payer: Self-pay | Admitting: Cardiovascular Disease

## 2017-06-20 ENCOUNTER — Encounter: Payer: Self-pay | Admitting: Internal Medicine

## 2017-07-29 ENCOUNTER — Emergency Department: Payer: Medicaid Other

## 2017-07-29 ENCOUNTER — Encounter: Payer: Self-pay | Admitting: Emergency Medicine

## 2017-07-29 ENCOUNTER — Inpatient Hospital Stay
Admission: EM | Admit: 2017-07-29 | Discharge: 2017-08-17 | DRG: 207 | Disposition: A | Discharge status: Home / Self Care | Payer: Medicaid Other | Attending: Internal Medicine | Admitting: Internal Medicine

## 2017-07-29 ENCOUNTER — Emergency Department
Admit: 2017-07-29 | Discharge: 2017-07-29 | Disposition: A | Discharge status: Home / Self Care | Payer: Medicaid Other | Attending: Student in an Organized Health Care Education/Training Program | Admitting: Student in an Organized Health Care Education/Training Program

## 2017-07-29 ENCOUNTER — Other Ambulatory Visit: Payer: Self-pay

## 2017-07-29 DIAGNOSIS — F419 Anxiety disorder, unspecified: Secondary | ICD-10-CM | POA: Diagnosis present

## 2017-07-29 DIAGNOSIS — T380X5A Adverse effect of glucocorticoids and synthetic analogues, initial encounter: Secondary | ICD-10-CM | POA: Diagnosis not present

## 2017-07-29 DIAGNOSIS — D751 Secondary polycythemia: Secondary | ICD-10-CM | POA: Diagnosis not present

## 2017-07-29 DIAGNOSIS — R7989 Other specified abnormal findings of blood chemistry: Secondary | ICD-10-CM | POA: Diagnosis not present

## 2017-07-29 DIAGNOSIS — R9389 Abnormal findings on diagnostic imaging of other specified body structures: Secondary | ICD-10-CM | POA: Diagnosis not present

## 2017-07-29 DIAGNOSIS — J181 Lobar pneumonia, unspecified organism: Secondary | ICD-10-CM | POA: Diagnosis present

## 2017-07-29 DIAGNOSIS — E876 Hypokalemia: Secondary | ICD-10-CM | POA: Diagnosis not present

## 2017-07-29 DIAGNOSIS — Z9981 Dependence on supplemental oxygen: Secondary | ICD-10-CM

## 2017-07-29 DIAGNOSIS — I5033 Acute on chronic diastolic (congestive) heart failure: Secondary | ICD-10-CM | POA: Diagnosis present

## 2017-07-29 DIAGNOSIS — J440 Chronic obstructive pulmonary disease with acute lower respiratory infection: Secondary | ICD-10-CM | POA: Diagnosis present

## 2017-07-29 DIAGNOSIS — J90 Pleural effusion, not elsewhere classified: Secondary | ICD-10-CM | POA: Diagnosis not present

## 2017-07-29 DIAGNOSIS — E871 Hypo-osmolality and hyponatremia: Secondary | ICD-10-CM | POA: Diagnosis not present

## 2017-07-29 DIAGNOSIS — J9622 Acute and chronic respiratory failure with hypercapnia: Secondary | ICD-10-CM

## 2017-07-29 DIAGNOSIS — R490 Dysphonia: Secondary | ICD-10-CM | POA: Diagnosis not present

## 2017-07-29 DIAGNOSIS — R1013 Epigastric pain: Secondary | ICD-10-CM

## 2017-07-29 DIAGNOSIS — Z4659 Encounter for fitting and adjustment of other gastrointestinal appliance and device: Secondary | ICD-10-CM

## 2017-07-29 DIAGNOSIS — I110 Hypertensive heart disease with heart failure: Secondary | ICD-10-CM | POA: Diagnosis present

## 2017-07-29 DIAGNOSIS — I313 Pericardial effusion (noninflammatory): Secondary | ICD-10-CM | POA: Diagnosis present

## 2017-07-29 DIAGNOSIS — E0965 Drug or chemical induced diabetes mellitus with hyperglycemia: Secondary | ICD-10-CM | POA: Diagnosis not present

## 2017-07-29 DIAGNOSIS — J9811 Atelectasis: Secondary | ICD-10-CM

## 2017-07-29 DIAGNOSIS — Z452 Encounter for adjustment and management of vascular access device: Secondary | ICD-10-CM

## 2017-07-29 DIAGNOSIS — K5641 Fecal impaction: Secondary | ICD-10-CM

## 2017-07-29 DIAGNOSIS — Z79899 Other long term (current) drug therapy: Secondary | ICD-10-CM

## 2017-07-29 DIAGNOSIS — Z978 Presence of other specified devices: Secondary | ICD-10-CM

## 2017-07-29 DIAGNOSIS — J441 Chronic obstructive pulmonary disease with (acute) exacerbation: Secondary | ICD-10-CM | POA: Diagnosis present

## 2017-07-29 DIAGNOSIS — E878 Other disorders of electrolyte and fluid balance, not elsewhere classified: Secondary | ICD-10-CM | POA: Diagnosis not present

## 2017-07-29 DIAGNOSIS — E875 Hyperkalemia: Secondary | ICD-10-CM | POA: Diagnosis not present

## 2017-07-29 DIAGNOSIS — I959 Hypotension, unspecified: Secondary | ICD-10-CM | POA: Diagnosis not present

## 2017-07-29 DIAGNOSIS — J942 Hemothorax: Secondary | ICD-10-CM | POA: Diagnosis not present

## 2017-07-29 DIAGNOSIS — J9601 Acute respiratory failure with hypoxia: Secondary | ICD-10-CM | POA: Diagnosis not present

## 2017-07-29 DIAGNOSIS — B370 Candidal stomatitis: Secondary | ICD-10-CM | POA: Diagnosis not present

## 2017-07-29 DIAGNOSIS — I3139 Other pericardial effusion (noninflammatory): Secondary | ICD-10-CM

## 2017-07-29 DIAGNOSIS — R739 Hyperglycemia, unspecified: Secondary | ICD-10-CM | POA: Diagnosis not present

## 2017-07-29 DIAGNOSIS — K5900 Constipation, unspecified: Secondary | ICD-10-CM | POA: Diagnosis not present

## 2017-07-29 DIAGNOSIS — F17210 Nicotine dependence, cigarettes, uncomplicated: Secondary | ICD-10-CM | POA: Diagnosis not present

## 2017-07-29 DIAGNOSIS — Z9911 Dependence on respirator [ventilator] status: Secondary | ICD-10-CM | POA: Diagnosis not present

## 2017-07-29 DIAGNOSIS — Z6841 Body Mass Index (BMI) 40.0 and over, adult: Secondary | ICD-10-CM

## 2017-07-29 DIAGNOSIS — Z01818 Encounter for other preprocedural examination: Secondary | ICD-10-CM

## 2017-07-29 DIAGNOSIS — J9690 Respiratory failure, unspecified, unspecified whether with hypoxia or hypercapnia: Secondary | ICD-10-CM

## 2017-07-29 DIAGNOSIS — J9620 Acute and chronic respiratory failure, unspecified whether with hypoxia or hypercapnia: Secondary | ICD-10-CM

## 2017-07-29 DIAGNOSIS — E8770 Fluid overload, unspecified: Secondary | ICD-10-CM | POA: Diagnosis not present

## 2017-07-29 DIAGNOSIS — J189 Pneumonia, unspecified organism: Secondary | ICD-10-CM | POA: Diagnosis not present

## 2017-07-29 DIAGNOSIS — T884XXD Failed or difficult intubation, subsequent encounter: Secondary | ICD-10-CM | POA: Diagnosis not present

## 2017-07-29 DIAGNOSIS — R0989 Other specified symptoms and signs involving the circulatory and respiratory systems: Secondary | ICD-10-CM

## 2017-07-29 DIAGNOSIS — J9600 Acute respiratory failure, unspecified whether with hypoxia or hypercapnia: Secondary | ICD-10-CM

## 2017-07-29 DIAGNOSIS — E662 Morbid (severe) obesity with alveolar hypoventilation: Secondary | ICD-10-CM | POA: Diagnosis not present

## 2017-07-29 DIAGNOSIS — J9621 Acute and chronic respiratory failure with hypoxia: Secondary | ICD-10-CM | POA: Diagnosis present

## 2017-07-29 DIAGNOSIS — F17200 Nicotine dependence, unspecified, uncomplicated: Secondary | ICD-10-CM | POA: Diagnosis not present

## 2017-07-29 DIAGNOSIS — J9819 Other pulmonary collapse: Secondary | ICD-10-CM | POA: Diagnosis not present

## 2017-07-29 DIAGNOSIS — N179 Acute kidney failure, unspecified: Secondary | ICD-10-CM | POA: Diagnosis not present

## 2017-07-29 DIAGNOSIS — K5901 Slow transit constipation: Secondary | ICD-10-CM | POA: Diagnosis not present

## 2017-07-29 DIAGNOSIS — N172 Acute kidney failure with medullary necrosis: Secondary | ICD-10-CM | POA: Diagnosis not present

## 2017-07-29 DIAGNOSIS — J80 Acute respiratory distress syndrome: Secondary | ICD-10-CM | POA: Diagnosis not present

## 2017-07-29 LAB — COMPREHENSIVE METABOLIC PANEL
ALBUMIN: 3.2 g/dL — AB (ref 3.5–5.0)
ALK PHOS: 100 U/L (ref 38–126)
ALT: 36 U/L (ref 14–54)
AST: 38 U/L (ref 15–41)
Anion gap: 11 (ref 5–15)
BILIRUBIN TOTAL: 2.1 mg/dL — AB (ref 0.3–1.2)
BUN: 10 mg/dL (ref 6–20)
CALCIUM: 8.5 mg/dL — AB (ref 8.9–10.3)
CO2: 32 mmol/L (ref 22–32)
Chloride: 93 mmol/L — ABNORMAL LOW (ref 101–111)
Creatinine, Ser: 0.86 mg/dL (ref 0.44–1.00)
GFR calc Af Amer: >60 mL/min (ref >60)
GFR calc non Af Amer: >60 mL/min (ref >60)
GLUCOSE: 162 mg/dL — AB (ref 65–99)
Potassium: 3.4 mmol/L — ABNORMAL LOW (ref 3.5–5.1)
SODIUM: 136 mmol/L (ref 135–145)
TOTAL PROTEIN: 7.5 g/dL (ref 6.5–8.1)

## 2017-07-29 LAB — CBC
HEMATOCRIT: 54.2 % — AB (ref 35.0–47.0)
HEMATOCRIT: 49.1 % — AB (ref 35.0–47.0)
Hemoglobin: 16.8 g/dL — ABNORMAL HIGH (ref 12.0–16.0)
Hemoglobin: 15.9 g/dL (ref 12.0–16.0)
MCH: 24 pg — AB (ref 26.0–34.0)
MCH: 24.8 pg — AB (ref 26.0–34.0)
MCHC: 30.9 g/dL — AB (ref 32.0–36.0)
MCHC: 32.3 g/dL (ref 32.0–36.0)
MCV: 77.7 fL — AB (ref 80.0–100.0)
MCV: 76.8 fL — ABNORMAL LOW (ref 80.0–100.0)
PLATELETS: 393 10*3/uL (ref 150–440)
Platelets: 376 10*3/uL (ref 150–440)
RBC: 6.97 MIL/uL — ABNORMAL HIGH (ref 3.80–5.20)
RBC: 6.4 MIL/uL — ABNORMAL HIGH (ref 3.80–5.20)
RDW: 20.2 % — AB (ref 11.5–14.5)
RDW: 20.9 % — AB (ref 11.5–14.5)
WBC: 8.2 10*3/uL (ref 3.6–11.0)
WBC: 12.9 10*3/uL — ABNORMAL HIGH (ref 3.6–11.0)

## 2017-07-29 LAB — BASIC METABOLIC PANEL
Anion gap: 11 (ref 5–15)
BUN: 8 mg/dL (ref 6–20)
CHLORIDE: 92 mmol/L — AB (ref 101–111)
CO2: 33 mmol/L — ABNORMAL HIGH (ref 22–32)
CREATININE: 1.08 mg/dL — AB (ref 0.44–1.00)
Calcium: 8.9 mg/dL (ref 8.9–10.3)
GFR calc Af Amer: >60 mL/min (ref >60)
GFR calc non Af Amer: 58 mL/min — ABNORMAL LOW (ref >60)
GLUCOSE: 204 mg/dL — AB (ref 65–99)
POTASSIUM: 3.6 mmol/L (ref 3.5–5.1)
SODIUM: 136 mmol/L (ref 135–145)

## 2017-07-29 LAB — TROPONIN I: Troponin I: <0.03 ng/mL (ref <0.03)

## 2017-07-29 LAB — DIFFERENTIAL
BASOS ABS: 0 10*3/uL (ref 0–0.1)
BASOS PCT: 0 %
BLASTS: 0 %
Band Neutrophils: 0 %
EOS ABS: 0.1 10*3/uL (ref 0–0.7)
EOS PCT: 1 %
LYMPHS PCT: 9 %
Lymphs Abs: 0.7 10*3/uL — ABNORMAL LOW (ref 1.0–3.6)
MONOS PCT: 8 %
MYELOCYTES: 0 %
Metamyelocytes Relative: 0 %
Monocytes Absolute: 0.7 10*3/uL (ref 0.2–0.9)
NEUTROS ABS: 6.7 10*3/uL — AB (ref 1.4–6.5)
NEUTROS PCT: 82 %
Other: 0 %
Promyelocytes Absolute: 0 %
nRBC: 0 /100 WBC

## 2017-07-29 LAB — PROCALCITONIN: Procalcitonin: 0.1 ng/mL

## 2017-07-29 LAB — LACTIC ACID, PLASMA: Lactic Acid, Venous: 1 mmol/L (ref 0.5–1.9)

## 2017-07-29 LAB — FIBRIN DERIVATIVES D-DIMER (ARMC ONLY): Fibrin derivatives D-dimer (ARMC): 2407.1 ng/mL (FEU) — ABNORMAL HIGH (ref 0.00–499.00)

## 2017-07-29 MED ORDER — VANCOMYCIN HCL IN DEXTROSE 1-5 GM/200ML-% IV SOLN
1000.0000 mg | Freq: Once | INTRAVENOUS | Status: AC
  Administered 2017-07-29: 1000 mg via INTRAVENOUS
  Filled 2017-07-29: qty 200

## 2017-07-29 MED ORDER — IBUPROFEN 600 MG PO TABS
600.0000 mg | ORAL_TABLET | Freq: Once | ORAL | Status: AC
  Administered 2017-07-29: 600 mg via ORAL
  Filled 2017-07-29: qty 1

## 2017-07-29 MED ORDER — DOCUSATE SODIUM 100 MG PO CAPS: 100.0000 mg | ORAL_CAPSULE | Freq: Two times a day (BID) | ORAL | Status: DC | PRN

## 2017-07-29 MED ORDER — OXYCODONE-ACETAMINOPHEN 5-325 MG PO TABS
1.0000 | ORAL_TABLET | Freq: Four times a day (QID) | ORAL | Status: DC | PRN
  Administered 2017-07-29: 1 via ORAL
  Filled 2017-07-29: qty 1

## 2017-07-29 MED ORDER — CLONAZEPAM 0.5 MG PO TABS: 0.5000 mg | ORAL_TABLET | Freq: Three times a day (TID) | ORAL | Status: DC | PRN

## 2017-07-29 MED ORDER — ASPIRIN EC 81 MG PO TBEC
81.0000 mg | DELAYED_RELEASE_TABLET | Freq: Every day | ORAL | Status: DC
Start: 2017-07-30 — End: 2017-07-31
  Administered 2017-07-30: 81 mg via ORAL
  Filled 2017-07-29: qty 1

## 2017-07-29 MED ORDER — AMITRIPTYLINE HCL 10 MG PO TABS
10.0000 mg | ORAL_TABLET | Freq: Every evening | ORAL | Status: DC | PRN
  Filled 2017-07-29: qty 1

## 2017-07-29 MED ORDER — IOPAMIDOL (ISOVUE-370) INJECTION 76%
75.0000 mL | Freq: Once | INTRAVENOUS | Status: AC | PRN
  Administered 2017-07-29: 75 mL via INTRAVENOUS

## 2017-07-29 MED ORDER — SODIUM CHLORIDE 0.9 % IV SOLN
2.0000 g | Freq: Once | INTRAVENOUS | Status: AC
  Administered 2017-07-29: 2 g via INTRAVENOUS
  Filled 2017-07-29: qty 2

## 2017-07-29 MED ORDER — FUROSEMIDE 10 MG/ML IJ SOLN
20.0000 mg | Freq: Two times a day (BID) | INTRAMUSCULAR | Status: DC
  Administered 2017-07-29 – 2017-07-30 (×3): 20 mg via INTRAVENOUS
  Filled 2017-07-29 (×3): qty 2

## 2017-07-29 MED ORDER — ALBUTEROL SULFATE (2.5 MG/3ML) 0.083% IN NEBU
2.5000 mg | INHALATION_SOLUTION | Freq: Four times a day (QID) | RESPIRATORY_TRACT | Status: DC | PRN
  Administered 2017-07-31: 2.5 mg via RESPIRATORY_TRACT
  Filled 2017-07-29: qty 3

## 2017-07-29 MED ORDER — AMLODIPINE BESYLATE 5 MG PO TABS
5.0000 mg | ORAL_TABLET | Freq: Every day | ORAL | Status: DC
  Administered 2017-07-30: 5 mg via ORAL
  Filled 2017-07-29: qty 1

## 2017-07-29 MED ORDER — CYCLOBENZAPRINE HCL 10 MG PO TABS
10.0000 mg | ORAL_TABLET | Freq: Three times a day (TID) | ORAL | Status: DC | PRN
  Filled 2017-07-29: qty 1

## 2017-07-29 MED ORDER — IPRATROPIUM-ALBUTEROL 0.5-2.5 (3) MG/3ML IN SOLN
3.0000 mL | Freq: Once | RESPIRATORY_TRACT | Status: AC
  Administered 2017-07-29: 3 mL via RESPIRATORY_TRACT
  Filled 2017-07-29: qty 3

## 2017-07-29 MED ORDER — HEPARIN SODIUM (PORCINE) 5000 UNIT/ML IJ SOLN
5000.0000 [IU] | Freq: Three times a day (TID) | INTRAMUSCULAR | Status: AC
  Administered 2017-07-29 – 2017-08-13 (×45): 5000 [IU] via SUBCUTANEOUS
  Filled 2017-07-29 (×45): qty 1

## 2017-07-29 MED ORDER — SODIUM CHLORIDE 0.9 % IV BOLUS (SEPSIS)
500.0000 mL | Freq: Once | INTRAVENOUS | Status: AC
  Administered 2017-07-29: 500 mL via INTRAVENOUS

## 2017-07-29 MED ORDER — FUROSEMIDE 10 MG/ML IJ SOLN
60.0000 mg | Freq: Once | INTRAMUSCULAR | Status: AC
  Administered 2017-07-29: 60 mg via INTRAVENOUS
  Filled 2017-07-29: qty 8

## 2017-07-29 MED ORDER — TRAMADOL HCL 50 MG PO TABS
50.0000 mg | ORAL_TABLET | Freq: Once | ORAL | Status: AC
  Administered 2017-07-29: 50 mg via ORAL
  Filled 2017-07-29: qty 1

## 2017-07-29 NOTE — ED Notes (Signed)
Pt with pox in mid 80s. Discussed with md who states pt will need bipap. Pt states she cannot tolerate bipap. Pt is able to speak in full sentences, does have frequent dry to congested cough. Diminished breath sounds, skin normal color warm and dry.

## 2017-07-29 NOTE — ED Triage Notes (Signed)
Pt arrived via POV from home with reports of chest pain that initially started last week, but went away, states the pain came back around 2a, pt states the pain feels like something came up and hit her in the chest. Pain when taking in a deep breath or breathing hard.  Pt is on 4.5L continuously for COPD.

## 2017-07-29 NOTE — ED Notes (Signed)
Blue and red top tube sent to lab 

## 2017-07-29 NOTE — Progress Notes (Signed)
Patient is admitted to room 247 with the diagnosis of acute on chronic respiratory failure with hypoxia.  Alert and oriented x 4. Medicated with percocet 1 tab for c/o chest pain. Full assessment to epic completed. Patient is on Venturi mask at a flow rate of 15 L/min and she's sating 94 %. Will re-assess and wean down and continue to monitor.

## 2017-07-29 NOTE — ED Notes (Signed)
Report from sarah, rn.  

## 2017-07-29 NOTE — ED Notes (Signed)
External female catheter placed per pt request.

## 2017-07-29 NOTE — ED Provider Notes (Signed)
[facility] Emergency Department Provider Note    First MD Initiated Contact with Patient 07/29/17 1656     (approximate)  I have reviewed the triage vital signs and the nursing notes.   HISTORY  Chief Complaint Chest Pain    HPI [name] is a 52 y.o. female story of COPD hypertension on chronic 4 L home O2 presents with sudden onset shortness of breath that started at 2 AM.  Had similar pain over a week ago that spontaneously resolved so she did not seek medical care.  States the pain does get worse with taking deep inspiration.  Has had a nonproductive cough.  No measured fevers at home.  Was recently admitted to the hospital for pneumonia and states that this feels somewhat different.  Denies any lower extremity swelling.  She is primarily concerned for blood clot.  She does not take any blood thinners.  No personal history of DVT or PE.  She does not smoke.  Past Medical History:  Diagnosis Date  . Anxiety   . COPD (chronic obstructive pulmonary disease) (HCC)   . Essential hypertension   . History of echocardiogram    a. 08/2013 Echo: EF 55-60%, impaired LV relaxation, mild LVH, nl RV fxn, nl RVSP, mild TR; b. 05/2017 Echo: EF 65-70%, no rwma, nl RV fxn.  . Morbid obesity (HCC)   . Tobacco abuse    Family History  Problem Relation Age of Onset  . Dementia Mother   . Heart attack Father    Past Surgical History:  Procedure Laterality Date  . ABDOMINAL HYSTERECTOMY    . CHOLECYSTECTOMY    . HERNIA REPAIR     Patient Active Problem List   Diagnosis Date Noted  . PNA (pneumonia) 06/07/2017      Prior to Admission medications   Medication Sig Start Date End Date Taking? Authorizing Provider  albuterol (PROVENTIL HFA;VENTOLIN HFA) 108 (90 Base) MCG/ACT inhaler Inhale 2 puffs into the lungs every 6 (six) hours as needed for wheezing or shortness of breath. 06/12/17   Gouru, Deanna ArtisAruna, MD  amitriptyline (ELAVIL) 10 MG tablet Take 10 mg by mouth  at bedtime as needed.  10/13/16 10/13/17  [provider]  amLODipine (NORVASC) 5 MG tablet Take 5 mg by mouth daily. 03/14/17 03/14/18  [provider]  aspirin EC 81 MG tablet Take 81 mg by mouth daily. 05/01/17 01/08/31  [provider]  clonazePAM (KLONOPIN) 0.5 MG tablet Take 0.5 mg by mouth 3 (three) times daily as needed.  07/31/16   [provider]  cyclobenzaprine (FLEXERIL) 10 MG tablet Take 10 mg by mouth 3 (three) times daily as needed. 08/16/16   [provider]  Fluticasone-Salmeterol (ADVAIR DISKUS) 250-50 MCG/DOSE AEPB Inhale 1 puff into the lungs 2 (two) times daily. 06/12/17   Ramonita LabGouru, Aruna, MD  furosemide (LASIX) 20 MG tablet Take 1 tablet (20 mg total) by mouth 2 (two) times daily. 06/12/17 06/12/18  Ramonita LabGouru, Aruna, MD  naproxen sodium (ALEVE) 220 MG tablet Take 220 mg by mouth daily as needed.    [provider]  nitroGLYCERIN (NITROSTAT) 0.4 MG SL tablet Place 0.4 mg under the tongue as needed. 05/01/17 05/01/18  [provider]  predniSONE (STERAPRED UNI-PAK 21 TAB) 10 MG (21) TBPK tablet Take 1 tablet (10 mg total) by mouth daily. Take 6 tablets by mouth for 1 day followed by  5 tablets by mouth for 1 day followed by  4 tablets by mouth for  1 day followed by  3 tablets by mouth for 1 day followed by  2 tablets by mouth for 1 day followed by  1 tablet by mouth for a day and stop 06/12/17   Gouru, Deanna Artis, MD  senna-docusate (SENOKOT-S) 8.6-50 MG tablet Take 1 tablet by mouth at bedtime as needed for mild constipation. 06/12/17   Ramonita Lab, MD    Allergies Patient has no known allergies.    Social History Social History   Tobacco Use  . Smoking status: Current Every Day Smoker    Packs/day: 0.75    Years: 40.00    Pack years: 30.00    Types: Cigarettes  . Smokeless tobacco: Never Used  Substance Use Topics  . Alcohol use: No  . Drug use: No    Review of Systems Patient denies headaches, rhinorrhea, blurry  vision, numbness, shortness of breath, chest pain, edema, cough, abdominal pain, nausea, vomiting, diarrhea, dysuria, fevers, rashes or hallucinations unless otherwise stated above in HPI. ____________________________________________   PHYSICAL EXAM:  VITAL SIGNS: Vitals:   07/29/17 1637  BP: 134/63  Pulse: (!) 118  Resp: (!) 26  Temp: 99.4 F (37.4 C)  SpO2: 92%    Constitutional: Alert and oriented. Well appearing and in no acute distress on 4L [place] Eyes: Conjunctivae are normal.  Head: Atraumatic. Nose: No congestion/rhinnorhea. Mouth/Throat: Mucous membranes are moist.   Neck: No stridor. Painless ROM.  Cardiovascular: Normal rate, regular rhythm. Grossly normal heart sounds.  Good peripheral circulation. Respiratory: Normal respiratory effort.  No retractions. Lungs with occasional wheeze, throughout, limited 2/2 body habitus Gastrointestinal: Soft and nontender. No distention. No abdominal bruits. No CVA tenderness. Genitourinary:  Musculoskeletal: No lower extremity tenderness nor edema.  No joint effusions. Neurologic:  Normal speech and language. No gross focal neurologic deficits are appreciated. No facial droop Skin:  Skin is warm, dry and intact. No rash noted. Psychiatric: Mood and affect are normal. Speech and behavior are normal.  ____________________________________________   LABS (all labs ordered are listed, but only abnormal results are displayed)  Results for orders placed or performed during the hospital encounter of 07/29/17 (from the past 24 hour(s))  Basic metabolic panel     Status: Abnormal   Collection Time: 07/29/17  4:46 PM  Result Value Ref Range   Sodium 136 135 - 145 mmol/L   Potassium 3.6 3.5 - 5.1 mmol/L   Chloride 92 (L) 101 - 111 mmol/L   CO2 33 (H) 22 - 32 mmol/L   Glucose, Bld 204 (H) 65 - 99 mg/dL   BUN 8 6 - 20 mg/dL   Creatinine, Ser 1.88 (H) 0.44 - 1.00 mg/dL   Calcium 8.9 8.9 - 41.6 mg/dL   GFR calc non Af Amer 58 (L) >60  mL/min   GFR calc Af Amer >60 >60 mL/min   Anion gap 11 5 - 15  CBC     Status: Abnormal   Collection Time: 07/29/17  4:46 PM  Result Value Ref Range   WBC 8.2 3.6 - 11.0 K/uL   RBC 6.97 (H) 3.80 - 5.20 MIL/uL   Hemoglobin 16.8 (H) 12.0 - 16.0 g/dL   HCT 60.6 (H) 30.1 - 60.1 %   MCV 77.7 (L) 80.0 - 100.0 fL   MCH 24.0 (L) 26.0 - 34.0 pg   MCHC 30.9 (L) 32.0 - 36.0 g/dL   RDW 09.3 (H) 23.5 - 57.3 %   Platelets 393 150 - 440 K/uL  Troponin I     Status: None  Collection Time: 07/29/17  4:46 PM  Result Value Ref Range   Troponin I <0.03 <0.03 ng/mL   ____________________________________________  EKG My review and personal interpretation at Time: 16:29   Indication: sob  Rate: 125  Rhythm: sinus Axis: normal Other: normal intervals, poor r wave progression, no stemi ____________________________________________  RADIOLOGY  I personally reviewed all radiographic images ordered to evaluate for the above acute complaints and reviewed radiology reports and findings.  These findings were personally discussed with the patient.  Please see medical record for radiology report.  ____________________________________________   PROCEDURES  Procedure(s) performed:  Procedures    Critical Care performed: no ____________________________________________   INITIAL IMPRESSION / ASSESSMENT AND PLAN / ED COURSE  Pertinent labs & imaging results that were available during my care of the patient were reviewed by me and considered in my medical decision making (see chart for details).  DDX: ACS, pericarditis, esophagitis, boerhaaves, pe, dissection, pna, bronchitis, costochondritis   [name] is a 52 y.o. who presents to the ED with symptoms as described above.  Patient with chronic O2 requirement.  Blood work will be sent for the above differential.  Will order d-dimer to further risk stratify for evidence of PE.  Clinical Course as of Jul 31 23  Wynelle Link Jul 29, 2017  1944 CT  Angio Chest PE W and/or Wo Contrast [PR]  1957 CT imaging shows evidence of atelectasis and sunken along the left lower lobe with effusion and moderate pericardial effusion.  Interval development of lymphadenopathy somewhat consistent with previous.  There is concern for pericarditis.  Will touch base with cardiology.  [PR]  2013 Spoke with Dr. Lennette Bihari of cardiology irrigated during the patient's presentation.  Agrees with stat echocardiogram.  Patient does appear well.  Blood pressure steadily improving with gentle IV hydration and tachycardia decreasing no worsening of her respiratory distress therefore do not feel that escalation and respiratory support indicated or needed at this moment but will continue to closely monitor.  [PR]    Clinical Course User Index [PR] Willy Eddy, MD     As part of my medical decision making, I reviewed the following data within the electronic MEDICAL RECORD NUMBER Nursing notes reviewed and incorporated, Labs reviewed, notes from prior ED visits.   ____________________________________________   FINAL CLINICAL IMPRESSION(S) / ED DIAGNOSES  Final diagnoses:  Acute on chronic respiratory failure with hypoxia (HCC)  Pericardial effusion      NEW MEDICATIONS STARTED DURING THIS VISIT:  New Prescriptions   No medications on file     Note:  This document was prepared using Dragon voice recognition software and may include unintentional dictation errors.    Willy Eddy, MD 07/30/17 [phone]

## 2017-07-29 NOTE — Progress Notes (Signed)
Family Meeting Note  Advance Directive:yes  Today a meeting took place with the Patient.   The following clinical team members were present during this meeting:MD  The following were discussed:Patient's diagnosis: Ac on ch respi failure, Ac on ch diastolic CHF, pericardial effusion, Patient's progosis: Unable to determine and Goals for treatment: Full Code  Additional follow-up to be provided: cardiology  Time spent during discussion:20 minutes  Angel DillingVaibhavkumar Aundria Bitterman, MD

## 2017-07-29 NOTE — Progress Notes (Signed)
No evidence of pericardial tamponade, echo report in chart.

## 2017-07-29 NOTE — Consult Note (Signed)
[name] is a 52 y.o. female  [id number]  Primary Cardiologist: Adrian Blackwater Reason for Consultation: chest pain  HPI: 52 year old African-American female with a history of hypertension COPD presented to the hospital with chest pain shortness of breath and basically respiratory failure. She's been coughing also and feeling dizzy.   Review of Systems: no syncope but feels very short of breath   Past Medical History:  Diagnosis Date  . Anxiety   . COPD (chronic obstructive pulmonary disease) (HCC)   . Essential hypertension   . History of echocardiogram    a. 08/2013 Echo: EF 55-60%, impaired LV relaxation, mild LVH, nl RV fxn, nl RVSP, mild TR; b. 05/2017 Echo: EF 65-70%, no rwma, nl RV fxn.  . Morbid obesity (HCC)   . Tobacco abuse      (Not in a hospital admission)     Infusions: . vancomycin      No Known Allergies  Social History   Socioeconomic History  . Marital status: Single    Spouse name: Not on file  . Number of children: Not on file  . Years of education: Not on file  . Highest education level: Not on file  Social Needs  . Financial resource strain: Not on file  . Food insecurity - worry: Not on file  . Food insecurity - inability: Not on file  . Transportation needs - medical: Not on file  . Transportation needs - non-medical: Not on file  Occupational History  . Not on file  Tobacco Use  . Smoking status: Current Every Day Smoker    Packs/day: 0.75    Years: 40.00    Pack years: 30.00    Types: Cigarettes  . Smokeless tobacco: Never Used  Substance and Sexual Activity  . Alcohol use: No  . Drug use: No  . Sexual activity: Not on file  Other Topics Concern  . Not on file  Social History Narrative   Lives in [place] with her mother, who has dementia.  She takes care of her mother and otw does not work.  She does not routinely exercise.    Family History  Problem Relation Age of Onset  . Dementia Mother   . Heart attack  Father     PHYSICAL EXAM: Vitals:   07/29/17 2015 07/29/17 2030  BP:  122/88  Pulse: (!) 117 (!) 118  Resp: (!) 26 (!) 25  Temp:    SpO2: 91% 91%    No intake or output data in the 24 hours ending 07/29/17 2046  General:  Well appearing. No respiratory difficulty HEENT: normal Neck: supple. no JVD. Carotids 2+ bilat; no bruits. No lymphadenopathy or thryomegaly appreciated. Cor: PMI nondisplaced. Regular rate & rhythm. No rubs, gallops or murmurs. Lungs: clear Abdomen: soft, nontender, nondistended. No hepatosplenomegaly. No bruits or masses. Good bowel sounds. Extremities: no cyanosis, clubbing, rash, edema Neuro: alert & oriented x 3, cranial nerves grossly intact. moves all 4 extremities w/o difficulty. Affect pleasant.  ECG: sinus tachycardia with right axis deviation no evidence of low voltage and poor hour progression due to COPD and not anteroseptal wall MI.  Results for orders placed or performed during the hospital encounter of 07/29/17 (from the past 24 hour(s))  Basic metabolic panel     Status: Abnormal   Collection Time: 07/29/17  4:46 PM  Result Value Ref Range   Sodium 136 135 - 145 mmol/L   Potassium 3.6 3.5 - 5.1 mmol/L   Chloride 92 (L)  101 - 111 mmol/L   CO2 33 (H) 22 - 32 mmol/L   Glucose, Bld 204 (H) 65 - 99 mg/dL   BUN 8 6 - 20 mg/dL   Creatinine, Ser 1.61 (H) 0.44 - 1.00 mg/dL   Calcium 8.9 8.9 - 09.6 mg/dL   GFR calc non Af Amer 58 (L) >60 mL/min   GFR calc Af Amer >60 >60 mL/min   Anion gap 11 5 - 15  CBC     Status: Abnormal   Collection Time: 07/29/17  4:46 PM  Result Value Ref Range   WBC 8.2 3.6 - 11.0 K/uL   RBC 6.97 (H) 3.80 - 5.20 MIL/uL   Hemoglobin 16.8 (H) 12.0 - 16.0 g/dL   HCT 04.5 (H) 40.9 - 81.1 %   MCV 77.7 (L) 80.0 - 100.0 fL   MCH 24.0 (L) 26.0 - 34.0 pg   MCHC 30.9 (L) 32.0 - 36.0 g/dL   RDW 91.4 (H) 78.2 - 95.6 %   Platelets 393 150 - 440 K/uL  Troponin I     Status: None   Collection Time: 07/29/17  4:46 PM  Result  Value Ref Range   Troponin I <0.03 <0.03 ng/mL  Differential     Status: Abnormal   Collection Time: 07/29/17  4:46 PM  Result Value Ref Range   Neutrophils Relative % 82 %   Lymphocytes Relative 9 %   Monocytes Relative 8 %   Eosinophils Relative 1 %   Basophils Relative 0 %   Band Neutrophils 0 %   Metamyelocytes Relative 0 %   Myelocytes 0 %   Promyelocytes Absolute 0 %   Blasts 0 %   nRBC 0 0 /100 WBC   Other 0 %   Neutro Abs 6.7 (H) 1.4 - 6.5 K/uL   Lymphs Abs 0.7 (L) 1.0 - 3.6 K/uL   Monocytes Absolute 0.7 0.2 - 0.9 K/uL   Eosinophils Absolute 0.1 0 - 0.7 K/uL   Basophils Absolute 0.0 0 - 0.1 K/uL  Fibrin derivatives D-Dimer ([facility] only)     Status: Abnormal   Collection Time: 07/29/17  5:30 PM  Result Value Ref Range   Fibrin derivatives D-dimer ([facility]) 2,407.10 (H) 0.00 - 499.00 ng/mL (FEU)  Lactic acid, plasma     Status: None   Collection Time: 07/29/17  8:01 PM  Result Value Ref Range   Lactic Acid, Venous 1.0 0.5 - 1.9 mmol/L   Dg Chest 2 View  Result Date: 07/29/2017 CLINICAL DATA:  52 year old female with history of chest pain since last week. Pain most severe during deep breathing. EXAM: CHEST - 2 VIEW COMPARISON:  Chest x-ray 06/10/2017. FINDINGS: Left lower lobe airspace consolidation concerning for pneumonia. Small bilateral pleural effusions. Mild diffuse peribronchial cuffing. Mild cardiomegaly. Upper mediastinal contours appear widened, but are stable compared to prior examinations. IMPRESSION: 1. Left lower lobe pneumonia with small bilateral pleural effusions. Followup PA and lateral chest X-ray is recommended in 3-4 weeks following trial of antibiotic therapy to ensure resolution and exclude underlying malignancy. 2. Mild cardiomegaly. Electronically Signed   By: Trudie Reed M.D.   On: 07/29/2017 17:37   Ct Angio Chest Pe W And/or Wo Contrast  Result Date: 07/29/2017 CLINICAL DATA:  52 year old female with history of chest pain since last week.  Difficulty taking deep breaths. EXAM: CT ANGIOGRAPHY CHEST WITH CONTRAST TECHNIQUE: Multidetector CT imaging of the chest was performed using the standard protocol during bolus administration of intravenous contrast. Multiplanar CT image reconstructions and  MIPs were obtained to evaluate the vascular anatomy. CONTRAST:  75mL ISOVUE-370 IOPAMIDOL (ISOVUE-370) INJECTION 76% COMPARISON:  Chest CT 06/10/2017. FINDINGS: Cardiovascular: No filling defects within the pulmonary arterial tree to suggest underlying pulmonary embolism. Heart size is normal. Moderate volume of pericardial fluid with pericardial enhancement concerning for pericarditis. No pericardial calcification. There is aortic atherosclerosis, as well as atherosclerosis of the great vessels of the mediastinum and the coronary arteries, including calcified atherosclerotic plaque in the left anterior descending coronary artery. Mediastinum/Nodes: Multiple prominent borderline enlarged and enlarged mediastinal lymph nodes, largest of which is in the prevascular nodal station measuring up to 22 mm in short axis, increased compared to the prior study. Esophagus is unremarkable in appearance. No axillary lymphadenopathy. Lungs/Pleura: Moderate left pleural effusion with near complete passive atelectasis of the left lower lobe. Some dependent subsegmental atelectasis is also noted in the left upper lobe. Trace right pleural effusion. Throughout the remaining portions of the lungs there is a background of ground-glass attenuation and mild interlobular septal thickening, indicative of a background of mild interstitial pulmonary edema. 5 mm right middle lobe pulmonary nodule (axial image 47 of series 7), stable dating back to at least 09/01/2013, considered definitively benign. Upper Abdomen: Aortic atherosclerosis. Musculoskeletal: There are no aggressive appearing lytic or blastic lesions noted in the visualized portions of the skeleton. Review of the MIP images  confirms the above findings. IMPRESSION: 1. No evidence of pulmonary embolism. 2. Extensive pericardial enhancement with moderate volume of pericardial fluid, indicative of an acute pericarditis. 3. This is associated with increasing mediastinal lymphadenopathy compared to the prior study. 4. In addition, there is evidence of mild pulmonary edema and bilateral pleural effusions (left greater than right) with extensive areas of passive atelectasis in the left lung. 5. Aortic atherosclerosis, in addition to left anterior descending coronary artery disease. Please note that although the presence of coronary artery calcium documents the presence of coronary artery disease, the severity of this disease and any potential stenosis cannot be assessed on this non-gated CT examination. Assessment for potential risk factor modification, dietary therapy or pharmacologic therapy may be warranted, if clinically indicated. Aortic Atherosclerosis (ICD10-I70.0). Electronically Signed   By: Trudie Reedaniel  Entrikin M.D.   On: 07/29/2017 19:45     ASSESSMENT AND PLAN: atypical chest pain with sinus tachycardia on EKG and CT showing left or right pleural effusion was moderate amount of pericardial effusion and calcified plaque in the LAD. Patient has atypical chest pain and hemodynamically stable and no evidence of pericardial tamponade  And advise echocardiogram for further evaluation. But I don't feel this is an emergency that requires pericardial centesis.  Khylah Kendra A

## 2017-07-29 NOTE — ED Notes (Signed)
Pt tolerating venti mask, pt again states she will not tolerate bipap, dr. Roxan Hockeyobinson at bedside speaking with pt.

## 2017-07-29 NOTE — ED Notes (Signed)
Echo in process 

## 2017-07-29 NOTE — ED Notes (Signed)
Per dr. Roxan Hockeyobinson, cancel code sepsis

## 2017-07-29 NOTE — ED Notes (Signed)
hospitalist in to see pt.

## 2017-07-29 NOTE — H&P (Signed)
[facility] - [place] at [facility]   PATIENT NAME: [name]    MR#:  [id number]  DATE OF BIRTH:  31-May-1965  DATE OF ADMISSION:  07/29/2017  PRIMARY CARE PHYSICIAN: [name], Nadyne Coombes, MD   REQUESTING/REFERRING PHYSICIAN: robinson  CHIEF COMPLAINT:   Chief Complaint  Patient presents with  . Chest Pain    HISTORY OF PRESENT ILLNESS: [name]  is a 52 y.o. female with a known history of COPD, essential hypertension, COPD, chronic home oxygen use 4 L/m, active smoking- started having chest pain which is central,nonradiating, dull and soreness type, associated with worsening shortness of breath so came to emergency room. This was started today morning and progressively felt getting worse with each breathing. Her CT scan chest for pulmonary embolism was negative, she had the bilateral small pleural effusion with some atelectasis and some pericardial effusion. She was also requiring oxygen via Ventimask, so given to hospitalist team for further management. Cardiology consult was done in ER and he had suggested no urgent requirement of pericardiocentesis. On further questioning patient confirms that she always has to sleep in slightly elevated position, and she has frequent complain of swelling on her lt leg. She drinks almost 2 L of total liquids in a day and compliant with her Lasix tablets.  PAST MEDICAL HISTORY:   Past Medical History:  Diagnosis Date  . Anxiety   . COPD (chronic obstructive pulmonary disease) (HCC)   . Essential hypertension   . History of echocardiogram    a. 08/2013 Echo: EF 55-60%, impaired LV relaxation, mild LVH, nl RV fxn, nl RVSP, mild TR; b. 05/2017 Echo: EF 65-70%, no rwma, nl RV fxn.  . Morbid obesity (HCC)   . Tobacco abuse     PAST SURGICAL HISTORY:  Past Surgical History:  Procedure Laterality Date  . ABDOMINAL HYSTERECTOMY    . CHOLECYSTECTOMY    . HERNIA REPAIR      SOCIAL HISTORY:  Social History   Tobacco Use  .  Smoking status: Current Every Day Smoker    Packs/day: 0.75    Years: 40.00    Pack years: 30.00    Types: Cigarettes  . Smokeless tobacco: Never Used  Substance Use Topics  . Alcohol use: No    FAMILY HISTORY:  Family History  Problem Relation Age of Onset  . Dementia Mother   . Heart attack Father     DRUG ALLERGIES: No Known Allergies  REVIEW OF SYSTEMS:   CONSTITUTIONAL: No fever, fatigue or weakness.  EYES: No blurred or double vision.  EARS, NOSE, AND THROAT: No tinnitus or ear pain.  RESPIRATORY: No cough,ositive for shortness of breath, no wheezing or hemoptysis.  CARDIOVASCULAR: Positive for chest pain, orthopnea, no edema.  GASTROINTESTINAL: No nausea, vomiting, diarrhea or abdominal pain.  GENITOURINARY: No dysuria, hematuria.  ENDOCRINE: No polyuria, nocturia,  HEMATOLOGY: No anemia, easy bruising or bleeding SKIN: No rash or lesion. MUSCULOSKELETAL: No joint pain or arthritis.   NEUROLOGIC: No tingling, numbness, weakness.  PSYCHIATRY: No anxiety or depression.   MEDICATIONS AT HOME:  Prior to Admission medications   Medication Sig Start Date End Date Taking? Authorizing Provider  albuterol (PROVENTIL HFA;VENTOLIN HFA) 108 (90 Base) MCG/ACT inhaler Inhale 2 puffs into the lungs every 6 (six) hours as needed for wheezing or shortness of breath. 06/12/17   Gouru, Deanna Artis, MD  amitriptyline (ELAVIL) 10 MG tablet Take 10 mg by mouth at bedtime as needed.  10/13/16 10/13/17  [provider]  amLODipine (  NORVASC) 5 MG tablet Take 5 mg by mouth daily. 03/14/17 03/14/18  [provider]  aspirin EC 81 MG tablet Take 81 mg by mouth daily. 05/01/17 01/08/31  [provider]  clonazePAM (KLONOPIN) 0.5 MG tablet Take 0.5 mg by mouth 3 (three) times daily as needed.  07/31/16   [provider]  cyclobenzaprine (FLEXERIL) 10 MG tablet Take 10 mg by mouth 3 (three) times daily as needed. 08/16/16   [provider]  furosemide (LASIX) 20 MG  tablet Take 1 tablet (20 mg total) by mouth 2 (two) times daily. 06/12/17 06/12/18  Ramonita Lab, MD  naproxen sodium (ALEVE) 220 MG tablet Take 220 mg by mouth daily as needed.    [provider]  nitroGLYCERIN (NITROSTAT) 0.4 MG SL tablet Place 0.4 mg under the tongue as needed. 05/01/17 05/01/18  [provider]  senna-docusate (SENOKOT-S) 8.6-50 MG tablet Take 1 tablet by mouth at bedtime as needed for mild constipation. 06/12/17   Gouru, Deanna Artis, MD      PHYSICAL EXAMINATION:   VITAL SIGNS: Blood pressure 115/90, pulse (!) 122, temperature 99.4 F (37.4 C), resp. rate (!) 22, SpO2 (!) 88 %.  GENERAL:  52 y.o.-year-old patient lying in the bed with no acute distress.  EYES: Pupils equal, round, reactive to light and accommodation. No scleral icterus. Extraocular muscles intact.  HEENT: Head atraumatic, normocephalic. Oropharynx and nasopharynx clear.  NECK:  Supple, no jugular venous distention. No thyroid enlargement, no tenderness.  LUNGS: Normal breath sounds bilaterally, no wheezing, some crepitation. No use of accessory muscles of respiration. On venti mask. CARDIOVASCULAR: S1, S2 normal. No murmurs, rubs, or gallops.  ABDOMEN: Soft, nontender, nondistended. Bowel sounds present. No organomegaly or mass.  EXTREMITIES: No pedal edema, cyanosis, or clubbing.  NEUROLOGIC: Cranial nerves II through XII are intact. Muscle strength 5/5 in all extremities. Sensation intact. Gait not checked.  PSYCHIATRIC: The patient is alert and oriented x 3.  SKIN: No obvious rash, lesion, or ulcer.   LABORATORY PANEL:   CBC Recent Labs  Lab 07/29/17 1646  WBC 8.2  HGB 16.8*  HCT 54.2*  PLT 393  MCV 77.7*  MCH 24.0*  MCHC 30.9*  RDW 20.2*  LYMPHSABS 0.7*  MONOABS 0.7  EOSABS 0.1  BASOSABS 0.0   ------------------------------------------------------------------------------------------------------------------  Chemistries  Recent Labs  Lab 07/29/17 1646  NA 136  K 3.6   CL 92*  CO2 33*  GLUCOSE 204*  BUN 8  CREATININE 1.08*  CALCIUM 8.9   ------------------------------------------------------------------------------------------------------------------ CrCl cannot be calculated (Unknown ideal weight.). ------------------------------------------------------------------------------------------------------------------ No results for input(s): TSH, T4TOTAL, T3FREE, THYROIDAB in the last 72 hours.  Invalid input(s): FREET3   Coagulation profile No results for input(s): INR, PROTIME in the last 168 hours. ------------------------------------------------------------------------------------------------------------------- No results for input(s): DDIMER in the last 72 hours. -------------------------------------------------------------------------------------------------------------------  Cardiac Enzymes Recent Labs  Lab 07/29/17 1646  TROPONINI <0.03   ------------------------------------------------------------------------------------------------------------------ Invalid input(s): POCBNP  ---------------------------------------------------------------------------------------------------------------  Urinalysis    Component Value Date/Time   COLORURINE Yellow 03/26/2014 1245   APPEARANCEUR Clear 03/26/2014 1245   LABSPEC 1.011 03/26/2014 1245   PHURINE 6.0 03/26/2014 1245   GLUCOSEU Negative 03/26/2014 1245   HGBUR Negative 03/26/2014 1245   BILIRUBINUR Negative 03/26/2014 1245   KETONESUR Negative 03/26/2014 1245   PROTEINUR Negative 03/26/2014 1245   NITRITE Negative 03/26/2014 1245   LEUKOCYTESUR Negative 03/26/2014 1245     RADIOLOGY: Dg Chest 2 View  Result Date: 07/29/2017 CLINICAL DATA:  52 year old female with history of chest pain since  last week. Pain most severe during deep breathing. EXAM: CHEST - 2 VIEW COMPARISON:  Chest x-ray 06/10/2017. FINDINGS: Left lower lobe airspace consolidation concerning for pneumonia. Small  bilateral pleural effusions. Mild diffuse peribronchial cuffing. Mild cardiomegaly. Upper mediastinal contours appear widened, but are stable compared to prior examinations. IMPRESSION: 1. Left lower lobe pneumonia with small bilateral pleural effusions. Followup PA and lateral chest X-ray is recommended in 3-4 weeks following trial of antibiotic therapy to ensure resolution and exclude underlying malignancy. 2. Mild cardiomegaly. Electronically Signed   By: Trudie Reedaniel  Entrikin M.D.   On: 07/29/2017 17:37   Ct Angio Chest Pe W And/or Wo Contrast  Result Date: 07/29/2017 CLINICAL DATA:  52 year old female with history of chest pain since last week. Difficulty taking deep breaths. EXAM: CT ANGIOGRAPHY CHEST WITH CONTRAST TECHNIQUE: Multidetector CT imaging of the chest was performed using the standard protocol during bolus administration of intravenous contrast. Multiplanar CT image reconstructions and MIPs were obtained to evaluate the vascular anatomy. CONTRAST:  75mL ISOVUE-370 IOPAMIDOL (ISOVUE-370) INJECTION 76% COMPARISON:  Chest CT 06/10/2017. FINDINGS: Cardiovascular: No filling defects within the pulmonary arterial tree to suggest underlying pulmonary embolism. Heart size is normal. Moderate volume of pericardial fluid with pericardial enhancement concerning for pericarditis. No pericardial calcification. There is aortic atherosclerosis, as well as atherosclerosis of the great vessels of the mediastinum and the coronary arteries, including calcified atherosclerotic plaque in the left anterior descending coronary artery. Mediastinum/Nodes: Multiple prominent borderline enlarged and enlarged mediastinal lymph nodes, largest of which is in the prevascular nodal station measuring up to 22 mm in short axis, increased compared to the prior study. Esophagus is unremarkable in appearance. No axillary lymphadenopathy. Lungs/Pleura: Moderate left pleural effusion with near complete passive atelectasis of the left  lower lobe. Some dependent subsegmental atelectasis is also noted in the left upper lobe. Trace right pleural effusion. Throughout the remaining portions of the lungs there is a background of ground-glass attenuation and mild interlobular septal thickening, indicative of a background of mild interstitial pulmonary edema. 5 mm right middle lobe pulmonary nodule (axial image 47 of series 7), stable dating back to at least 09/01/2013, considered definitively benign. Upper Abdomen: Aortic atherosclerosis. Musculoskeletal: There are no aggressive appearing lytic or blastic lesions noted in the visualized portions of the skeleton. Review of the MIP images confirms the above findings. IMPRESSION: 1. No evidence of pulmonary embolism. 2. Extensive pericardial enhancement with moderate volume of pericardial fluid, indicative of an acute pericarditis. 3. This is associated with increasing mediastinal lymphadenopathy compared to the prior study. 4. In addition, there is evidence of mild pulmonary edema and bilateral pleural effusions (left greater than right) with extensive areas of passive atelectasis in the left lung. 5. Aortic atherosclerosis, in addition to left anterior descending coronary artery disease. Please note that although the presence of coronary artery calcium documents the presence of coronary artery disease, the severity of this disease and any potential stenosis cannot be assessed on this non-gated CT examination. Assessment for potential risk factor modification, dietary therapy or pharmacologic therapy may be warranted, if clinically indicated. Aortic Atherosclerosis (ICD10-I70.0). Electronically Signed   By: Trudie Reedaniel  Entrikin M.D.   On: 07/29/2017 19:45    EKG: Orders placed or performed during the hospital encounter of 07/29/17  . EKG 12-Lead  . EKG 12-Lead  . EKG 12-Lead  . EKG 12-Lead  . EKG 12-Lead  . EKG 12-Lead  . ED EKG within 10 minutes  . ED EKG within 10 minutes    IMPRESSION  AND  PLAN:  * Acute on chronic respiratory failure with hypoxia   Secondary to acute on chronic diastolic congestive heart failure    ER physician gave her 1 dose of broad-spectrum antibiotics, I do not suspect infection here, follow pro-calcitonin.   Continue supplemental oxygen currently, try to taper back to her baseline 4 L oxygen.   No active wheezing.  * acute on chronic diastolic congestive heart failure   IV Lasix, monitor intake and output, will follow chest x-ray.   Cardiology consult.  * pericardial effusion and pleural effusion   Likely secondary to CHF   Monitor, appreciated help by cardiology.  * hypertension   Continue home medication.  * Active smoking   Counseled to quit smoking for 4 minutes and offered nicotine patch.   All the records are reviewed and case discussed with ED provider. Management plans discussed with the patient, family and they are in agreement.  CODE STATUS: full. Code Status History    Date Active Date Inactive Code Status Order ID Comments User Context   06/07/2017 13:47 06/12/2017 20:13 Full Code [id number]  Adrian Saran, MD Inpatient     Patient's daughter was present in the room during my visit.  TOTAL TIME TAKING CARE OF THIS PATIENT: 50 minutes.    Altamese Dilling M.D on 07/29/2017   Between 7am to 6pm - Pager - [phone]  After 6pm go to [email or web address] - password [email or web address]  [facility]  Office  [phone]  CC: Primary care physician; Cathlean Marseilles, MD   Note: This dictation was prepared with Dragon dictation along with smaller phrase technology. Any transcriptional errors that result from this process are unintentional.

## 2017-07-29 NOTE — ED Notes (Signed)
Not able to obtain blood cultures, dr. Roxan Hockeyobinson notified and canceled blood cultures.

## 2017-07-29 NOTE — ED Notes (Signed)
Attempt to secure additional iv without success. Jan, ed tech in to obtain blood cultures and lactic acid.

## 2017-07-30 ENCOUNTER — Inpatient Hospital Stay
Admit: 2017-07-30 | Discharge: 2017-07-30 | Disposition: A | Discharge status: Home / Self Care | Payer: Medicaid Other | Attending: Internal Medicine | Admitting: Internal Medicine

## 2017-07-30 ENCOUNTER — Inpatient Hospital Stay: Payer: Medicaid Other

## 2017-07-30 DIAGNOSIS — J9621 Acute and chronic respiratory failure with hypoxia: Secondary | ICD-10-CM

## 2017-07-30 DIAGNOSIS — R9389 Abnormal findings on diagnostic imaging of other specified body structures: Secondary | ICD-10-CM

## 2017-07-30 DIAGNOSIS — J189 Pneumonia, unspecified organism: Secondary | ICD-10-CM

## 2017-07-30 LAB — BASIC METABOLIC PANEL
Anion gap: 15 (ref 5–15)
BUN: 13 mg/dL (ref 6–20)
CHLORIDE: 91 mmol/L — AB (ref 101–111)
CO2: 30 mmol/L (ref 22–32)
Calcium: 8.7 mg/dL — ABNORMAL LOW (ref 8.9–10.3)
Creatinine, Ser: 0.96 mg/dL (ref 0.44–1.00)
GFR calc Af Amer: >60 mL/min (ref >60)
GFR calc non Af Amer: >60 mL/min (ref >60)
GLUCOSE: 146 mg/dL — AB (ref 65–99)
POTASSIUM: 3.2 mmol/L — AB (ref 3.5–5.1)
Sodium: 136 mmol/L (ref 135–145)

## 2017-07-30 LAB — CBC
HEMATOCRIT: 51.6 % — AB (ref 35.0–47.0)
HEMOGLOBIN: 15.8 g/dL (ref 12.0–16.0)
MCH: 24 pg — AB (ref 26.0–34.0)
MCHC: 30.6 g/dL — AB (ref 32.0–36.0)
MCV: 78.5 fL — AB (ref 80.0–100.0)
Platelets: 382 10*3/uL (ref 150–440)
RBC: 6.57 MIL/uL — ABNORMAL HIGH (ref 3.80–5.20)
RDW: 21 % — AB (ref 11.5–14.5)
WBC: 11.6 10*3/uL — ABNORMAL HIGH (ref 3.6–11.0)

## 2017-07-30 LAB — PROCALCITONIN: PROCALCITONIN: 0.2 ng/mL

## 2017-07-30 MED ORDER — LISINOPRIL 10 MG PO TABS
10.0000 mg | ORAL_TABLET | Freq: Every day | ORAL | Status: DC
  Administered 2017-07-30: 10 mg via ORAL
  Filled 2017-07-30: qty 1

## 2017-07-30 MED ORDER — POTASSIUM CHLORIDE 20 MEQ PO PACK
40.0000 meq | PACK | Freq: Once | ORAL | Status: AC
  Administered 2017-07-30: 40 meq via ORAL
  Filled 2017-07-30: qty 2

## 2017-07-30 NOTE — Progress Notes (Signed)
[facility] - [place] at [facility]   PATIENT NAME: [name]    MR#:  [id number]  DATE OF BIRTH:  20-Nov-1965  SUBJECTIVE:  CHIEF COMPLAINT:   Chief Complaint  Patient presents with  . Chest Pain  not looking good, hypotensive, SOB REVIEW OF SYSTEMS:  Review of Systems  Constitutional: Negative for chills, fever and weight loss.  HENT: Negative for nosebleeds and sore throat.   Eyes: Negative for blurred vision.  Respiratory: Positive for shortness of breath. Negative for cough and wheezing.   Cardiovascular: Negative for chest pain, orthopnea, leg swelling and PND.  Gastrointestinal: Negative for abdominal pain, constipation, diarrhea, heartburn, nausea and vomiting.  Genitourinary: Negative for dysuria and urgency.  Musculoskeletal: Negative for back pain.  Skin: Negative for rash.  Neurological: Negative for dizziness, speech change, focal weakness and headaches.  Endo/Heme/Allergies: Does not bruise/bleed easily.  Psychiatric/Behavioral: Negative for depression.    DRUG ALLERGIES:  No Known Allergies VITALS:  Blood pressure (!) 92/58, pulse (!) 103, temperature 98.7 F (37.1 C), temperature source Oral, resp. rate (!) 26, weight 118.6 kg (261 lb 8 oz), SpO2 94 %. PHYSICAL EXAMINATION:  Physical Exam  Constitutional: She is oriented to person, place, and time and well-developed, well-nourished, and in no distress.  HENT:  Head: Normocephalic and atraumatic.  HFNC on  Eyes: Conjunctivae and EOM are normal. Pupils are equal, round, and reactive to light.  Neck: Normal range of motion. Neck supple. No tracheal deviation present. No thyromegaly present.  Cardiovascular: Normal rate, regular rhythm and normal heart sounds.  Pulmonary/Chest: Accessory muscle usage present. She is in respiratory distress. She has decreased breath sounds in the left lower field. She has no wheezes. She has rhonchi in the left lower field. She exhibits no tenderness.    Abdominal: Soft. Bowel sounds are normal. She exhibits no distension. There is no tenderness.  Musculoskeletal: Normal range of motion.  Neurological: She is alert and oriented to person, place, and time. No cranial nerve deficit.  Skin: Skin is warm and dry. No rash noted.  Psychiatric: Mood and affect normal.   LABORATORY PANEL:  Female CBC Recent Labs  Lab 07/30/17 0459  WBC 11.6*  HGB 15.8  HCT 51.6*  PLT 382   ------------------------------------------------------------------------------------------------------------------ Chemistries  Recent Labs  Lab 07/29/17 2235 07/30/17 0459  NA 136 136  K 3.4* 3.2*  CL 93* 91*  CO2 32 30  GLUCOSE 162* 146*  BUN 10 13  CREATININE 0.86 0.96  CALCIUM 8.5* 8.7*  AST 38  --   ALT 36  --   ALKPHOS 100  --   BILITOT 2.1*  --    RADIOLOGY:  Dg Chest 2 View  Result Date: 07/30/2017 CLINICAL DATA:  Shortness of breath. Acute on chronic respiratory failure. Hypoxia. Morbid obesity. EXAM: CHEST - 2 VIEW COMPARISON:  Chest x-ray and CT chest 07/29/2017. FINDINGS: Cardiac silhouette is partially silhouetted by large pleural effusion on the LEFT. There is a whiteout of the LEFT hemithorax, which could represent mucous plugging and or increasing effusion. The RIGHT lung demonstrates interstitial edema with small effusion. IMPRESSION: Marked worsening aeration. Complete opacity of the LEFT hemithorax represents a significant change from yesterday's radiograph. Electronically Signed   By: Elsie Stain M.D.   On: 07/30/2017 08:45   ASSESSMENT AND PLAN:   * Acute on chronic respiratory failure with hypoxia   Secondary to acute on chronic diastolic congestive heart failure - remains on HFNC, try to taper back to her  baseline 4 L oxygen if able.  * acute on chronic diastolic congestive heart failure   IV Lasix, monitor intake and output, chest x-ray shows complete opacification of left hemithorax - will c/s Pulmo - may need eval by Dr Thelma Bargeaks    Cardiology input appreciated  * pericardial effusion and pleural effusion   Likely secondary to CHF - CXR looks worse - complete opacification on left side  * Hypotension with h/o HTN - HOLD all BP meds  * Active smoking   Counseled to quit smoking for 4 minutes and offered nicotine patch.    Will transfer to step-down for close monitoring, her resp status is very tenuous - if worsens she may need intubation.  I've d/w Lafonda Mossesiana and nurse on floor about transfer   All the records are reviewed and case discussed with Care Management/Social Worker. Management plans discussed with the patient, nursing and they are in agreement.  CODE STATUS: Full Code  TOTAL TIME (Critical Care) TAKING CARE OF THIS PATIENT: 25 minutes.   More than 50% of the time was spent in counseling/coordination of care: YES  POSSIBLE D/C IN 2-3 DAYS, DEPENDING ON CLINICAL CONDITION.   Delfino LovettVipul Jeremey Bascom M.D on 07/30/2017 at 10:44 PM  Between 7am to 6pm - Pager - [phone]  After 6pm go to [email or web address] - Social research officer, [email or web address] [email or web address]  [facility] [place] [facility]  Office  [phone]  CC: Primary care physician; Cathlean Marseillesaaleman, Timothy P, MD  Note: This dictation was prepared with Dragon dictation along with smaller phrase technology. Any transcriptional errors that result from this process are unintentional.

## 2017-07-30 NOTE — Progress Notes (Signed)
SUBJECTIVE: No chest pain. Shortness of breath is controlled with HFNC.   Vitals:   07/30/17 0000 07/30/17 0329 07/30/17 0331 07/30/17 0842  BP:   102/69 118/86  Pulse:   (!) 102 100  Resp:   18 18  Temp:   (!) 97.5 F (36.4 C) 98.1 F (36.7 C)  TempSrc:    Oral  SpO2: 90%  91% 93%  Weight:  261 lb 8 oz (118.6 kg)      Intake/Output Summary (Last 24 hours) at 07/30/2017 0910 Last data filed at 07/30/2017 0329 Gross per 24 hour  Intake 600 ml  Output 550 ml  Net 50 ml    LABS: Basic Metabolic Panel: Recent Labs    07/29/17 2235 07/30/17 0459  NA 136 136  K 3.4* 3.2*  CL 93* 91*  CO2 32 30  GLUCOSE 162* 146*  BUN 10 13  CREATININE 0.86 0.96  CALCIUM 8.5* 8.7*   Liver Function Tests: Recent Labs    07/29/17 2235  AST 38  ALT 36  ALKPHOS 100  BILITOT 2.1*  PROT 7.5  ALBUMIN 3.2*   No results for input(s): LIPASE, AMYLASE in the last 72 hours. CBC: Recent Labs    07/29/17 1646 07/29/17 2235 07/30/17 0459  WBC 8.2 12.9* 11.6*  NEUTROABS 6.7*  --   --   HGB 16.8* 15.9 15.8  HCT 54.2* 49.1* 51.6*  MCV 77.7* 76.8* 78.5*  PLT 393 376 382   Cardiac Enzymes: Recent Labs    07/29/17 1646  TROPONINI <0.03   BNP: Invalid input(s): POCBNP D-Dimer: No results for input(s): DDIMER in the last 72 hours. Hemoglobin A1C: No results for input(s): HGBA1C in the last 72 hours. Fasting Lipid Panel: No results for input(s): CHOL, HDL, LDLCALC, TRIG, CHOLHDL, LDLDIRECT in the last 72 hours. Thyroid Function Tests: No results for input(s): TSH, T4TOTAL, T3FREE, THYROIDAB in the last 72 hours.  Invalid input(s): FREET3 Anemia Panel: No results for input(s): VITAMINB12, FOLATE, FERRITIN, TIBC, IRON, RETICCTPCT in the last 72 hours.   PHYSICAL EXAM General: Appears fatigued HEENT:  Normocephalic and atramatic Neck:  No JVD.  Lungs: Mildly increased wort of breathing. Heart: Tachycardic. Normal S1 and S2 without gallops or murmurs.  Abdomen: Bowel sounds are  positive, abdomen soft and non-tender  Msk:  Back normal, normal gait. Normal strength and tone for age. Extremities: No clubbing, cyanosis or edema.   Neuro: Alert and oriented X 3. Psych:  Good affect, responds appropriately  TELEMETRY: Sinus tachycardia 102bpm  ASSESSMENT AND PLAN:  Acute respiratory failure secondary to COPD and congestive heart failure with preserved ejection fraction, grade 1 diastolic dysfunction. Echo done 07/29/17: Hyperdynamic LV systolic function with diastolic dysfunction.   Small anterior and posterior pericardial effusion without RV/RA collapse. NO evidence of pericardial tamponade. Repeat echo pending to assess pericardial effusion.  Continue respiratory support, continue lasix, started low-dose ACE-I, and add metoprolol once results of repeat echo are final and tamponade is ruled out.    Principal Problem:   Acute on chronic respiratory failure with hypoxia [facility](HCC) Active Problems:   Acute on chronic respiratory failure (HCC)    Angel HammanKristin Shelbylynn Walczyk, NP-C 07/30/2017 9:10 AM Cell: [phone]

## 2017-07-30 NOTE — Progress Notes (Signed)
*  PRELIMINARY RESULTS* Echocardiogram 2D Echocardiogram has been performed.  Cristela BlueHege, Moni Rothrock 07/30/2017, 9:30 AM

## 2017-07-30 NOTE — Care Management (Addendum)
Admitted with Heart Failure. Patient has chronic home 02 through charity care with [facility] At present patient is on HFNC and unable to participate in conversation due to dyspnea

## 2017-07-30 NOTE — Consult Note (Signed)
Name: [name] MRN: [id number] DOB: 03/03/1966    ADMISSION DATE:  07/29/2017 CONSULTATION DATE: 07/30/2017  REFERRING MD : Dr. Sherryll BurgerShah   CHIEF COMPLAINT:   BRIEF PATIENT DESCRIPTION:  52 yo female admitted to telemetry unit with chest pain, pericardial effusion and acute on chronic hypoxic respiratory failure secondary to possible LLL pneumonia and acute CHF exacerbation. Required transfer to stepdown unit on 03/18 with hypotension secondary to antihypertensive and worsening acute on chronic hypoxic respiratory failure secondary to pulmonary edema and complete opacification of left hemithorax secondary to mucous plugging vs. large left pleural effusion requiring HFNC  SIGNIFICANT EVENTS/STUDIES: 03/17-Pt admitted to telemetry unit. CT Angio Chest revealed no evidence of pulmonary embolism. Extensive pericardial enhancement with moderate volume of pericardial fluid, indicative of an acute pericarditis. This is associated with increasing mediastinal lymphadenopathy compared to the prior study. In addition, there is evidence of mild pulmonary edema and bilateral pleural effusions (left greater than right) with extensive areas of passive atelectasis in the left lung. Aortic atherosclerosis 03/18-Echo resulted normal LVEF, mild diastolic dysfunction and trivial pericardial effusion with moderate left pleural effusion. Transferred to stepdown unit with hypotension and worsening acute on chronic hypoxic respiratory failure secondary to pulmonary edema and complete opacification of left hemithorax secondary to mucous plugging vs. large left pleural effusion requiring HFNC  HISTORY OF PRESENT ILLNESS:   This is a 52 yo female with a PMH of Tobacco Abuse, Morbid Obesity, Essential HTN, COPD, Chronic Home @4 .5L O2, and Anxiety.  [facility] on 03[facility]17 with c/o intermittent chest pain worse during inspiration and nonproductive cough onset 1 week prior to presentation.  In the ER CT Angio  Chest revealed extensive pericardial enhancement with moderate volume of pericardial fluid indicative of acute pericarditis, mild pulmonary edema, bilateral pleural effusions, and no evidence of pulmonary embolism.  Therefore, Cardiologist Dr. Welton FlakesKhan consulted by ER physician, Dr. Welton FlakesKhan saw no evidence of pericardial tamponade and recommended an Echo.  CXR concerning for possible LLL pneumonia, therefore pt received iv vancomycin in ER.  She was subsequently admitted by hospitalist team to telemetry unit for further workup and treatment.  On 03/18 pt developed hypotension and worsening acute hypoxic respiratory failure secondary to pulmonary edema and complete opacification of left hemithorax secondary to mucous plugging vs. large left pleural effusion requiring HFNC  PAST MEDICAL HISTORY :   has a past medical history of Anxiety, COPD (chronic obstructive pulmonary disease) (HCC), Essential hypertension, History of echocardiogram, Morbid obesity (HCC), and Tobacco abuse.  has a past surgical history that includes Cholecystectomy; Abdominal hysterectomy; and Hernia repair. Prior to Admission medications   Medication Sig Start Date End Date Taking? Authorizing Provider  albuterol (PROVENTIL HFA;VENTOLIN HFA) 108 (90 Base) MCG/ACT inhaler Inhale 2 puffs into the lungs every 6 (six) hours as needed for wheezing or shortness of breath. 06/12/17   Gouru, Deanna ArtisAruna, MD  amitriptyline (ELAVIL) 10 MG tablet Take 10 mg by mouth at bedtime as needed.  10/13/16 10/13/17  [provider]  amLODipine (NORVASC) 5 MG tablet Take 5 mg by mouth daily. 03/14/17 03/14/18  [provider]  aspirin EC 81 MG tablet Take 81 mg by mouth daily. 05/01/17 01/08/31  [provider]  clonazePAM (KLONOPIN) 0.5 MG tablet Take 0.5 mg by mouth 3 (three) times daily as needed.  07/31/16   [provider]  cyclobenzaprine (FLEXERIL) 10 MG tablet Take 10 mg by mouth 3 (three) times daily as needed. 08/16/16   [provider]  furosemide (LASIX) 20 MG tablet Take 1 tablet (20 mg total) by mouth 2 (two) times daily. 06/12/17 06/12/18  Ramonita Lab, MD  naproxen sodium (ALEVE) 220 MG tablet Take 220 mg by mouth daily as needed.    [provider]  nitroGLYCERIN (NITROSTAT) 0.4 MG SL tablet Place 0.4 mg under the tongue as needed. 05/01/17 05/01/18  [provider]  senna-docusate (SENOKOT-S) 8.6-50 MG tablet Take 1 tablet by mouth at bedtime as needed for mild constipation. 06/12/17   Ramonita Lab, MD   No Known Allergies  FAMILY HISTORY:  family history includes Dementia in her mother; Heart attack in her father. SOCIAL HISTORY:  reports that she has been smoking cigarettes.  She has a 30.00 pack-year smoking history. she has never used smokeless tobacco. She reports that she does not drink alcohol or use drugs.  REVIEW OF SYSTEMS: Positives in BOLD  Constitutional: Negative for fever, chills, weight loss, malaise/fatigue and diaphoresis.  HENT: Negative for hearing loss, ear pain, nosebleeds, congestion, sore throat, neck pain, tinnitus and ear discharge.   Eyes: Negative for blurred vision, double vision, photophobia, pain, discharge and redness.  Respiratory: intermittent cough, hemoptysis, sputum production, shortness of breath, wheezing and stridor.   Cardiovascular: intermittent chest pain, palpitations, orthopnea, claudication, leg swelling and PND.  Gastrointestinal: Negative for heartburn, nausea, vomiting, abdominal pain, diarrhea, constipation, blood in stool and melena.  Genitourinary: Negative for dysuria, urgency, frequency, hematuria and flank pain.  Musculoskeletal: Negative for myalgias, back pain, joint pain and falls.  Skin: Negative for itching and rash.  Neurological: Negative for dizziness, tingling, tremors, sensory change, speech change, focal weakness, seizures, loss of consciousness, weakness and headaches.  Endo/Heme/Allergies: Negative for environmental  allergies and polydipsia. Does not bruise/bleed easily.  SUBJECTIVE:  No c/o pain   VITAL SIGNS: Temp:  [97.5 F (36.4 C)-98.7 F (37.1 C)] 98.7 F (37.1 C) (03/18 1927) Pulse Rate:  [95-103] 103 (03/18 2227) Resp:  [18-26] 26 (03/18 1927) BP: (92-125)/(49-86) 92/58 (03/18 2227) SpO2:  [90 %-94 %] 94 % (03/18 2100) FiO2 (%):  [60 %-62 %] 60 % (03/18 2100) Weight:  [118.6 kg (261 lb 8 oz)] 118.6 kg (261 lb 8 oz) (03/18 0329)  PHYSICAL EXAMINATION: General: well developed, well nourished, NAD  Neuro: alert and oriented, follows commands  HEENT: supple, no JVD  Cardiovascular: nsr, s1s2, no M/R/G Lungs: rhonchi throughout, even, non labored  Abdomen: +BS x4, soft, obese, non distended, non tender  Musculoskeletal: trace bilateral lower extremity edema  Skin: intact no rashes or lesions   Recent Labs  Lab 07/29/17 1646 07/29/17 2235 07/30/17 0459  NA 136 136 136  K 3.6 3.4* 3.2*  CL 92* 93* 91*  CO2 33* 32 30  BUN 8 10 13   CREATININE 1.08* 0.86 0.96  GLUCOSE 204* 162* 146*   Recent Labs  Lab 07/29/17 1646 07/29/17 2235 07/30/17 0459  HGB 16.8* 15.9 15.8  HCT 54.2* 49.1* 51.6*  WBC 8.2 12.9* 11.6*  PLT 393 376 382   Dg Chest 2 View  Result Date: 07/30/2017 CLINICAL DATA:  Shortness of breath. Acute on chronic respiratory failure. Hypoxia. Morbid obesity. EXAM: CHEST - 2 VIEW COMPARISON:  Chest x-ray and CT chest 07/29/2017. FINDINGS: Cardiac silhouette is partially silhouetted by large pleural effusion on the LEFT. There is a whiteout of the LEFT hemithorax, which could represent mucous plugging and or increasing effusion. The RIGHT lung demonstrates interstitial edema with small effusion. IMPRESSION: Marked worsening aeration. Complete opacity of the LEFT hemithorax represents  a significant change from yesterday's radiograph. Electronically Signed   By: Elsie Stain M.D.   On: 07/30/2017 08:45   Dg Chest 2 View  Result Date: 07/29/2017 CLINICAL DATA:  52 year old  female with history of chest pain since last week. Pain most severe during deep breathing. EXAM: CHEST - 2 VIEW COMPARISON:  Chest x-ray 06/10/2017. FINDINGS: Left lower lobe airspace consolidation concerning for pneumonia. Small bilateral pleural effusions. Mild diffuse peribronchial cuffing. Mild cardiomegaly. Upper mediastinal contours appear widened, but are stable compared to prior examinations. IMPRESSION: 1. Left lower lobe pneumonia with small bilateral pleural effusions. Followup PA and lateral chest X-ray is recommended in 3-4 weeks following trial of antibiotic therapy to ensure resolution and exclude underlying malignancy. 2. Mild cardiomegaly. Electronically Signed   By: Trudie Reed M.D.   On: 07/29/2017 17:37   Ct Angio Chest Pe W And/or Wo Contrast  Result Date: 07/29/2017 CLINICAL DATA:  52 year old female with history of chest pain since last week. Difficulty taking deep breaths. EXAM: CT ANGIOGRAPHY CHEST WITH CONTRAST TECHNIQUE: Multidetector CT imaging of the chest was performed using the standard protocol during bolus administration of intravenous contrast. Multiplanar CT image reconstructions and MIPs were obtained to evaluate the vascular anatomy. CONTRAST:  75mL ISOVUE-370 IOPAMIDOL (ISOVUE-370) INJECTION 76% COMPARISON:  Chest CT 06/10/2017. FINDINGS: Cardiovascular: No filling defects within the pulmonary arterial tree to suggest underlying pulmonary embolism. Heart size is normal. Moderate volume of pericardial fluid with pericardial enhancement concerning for pericarditis. No pericardial calcification. There is aortic atherosclerosis, as well as atherosclerosis of the great vessels of the mediastinum and the coronary arteries, including calcified atherosclerotic plaque in the left anterior descending coronary artery. Mediastinum/Nodes: Multiple prominent borderline enlarged and enlarged mediastinal lymph nodes, largest of which is in the prevascular nodal station measuring up to  22 mm in short axis, increased compared to the prior study. Esophagus is unremarkable in appearance. No axillary lymphadenopathy. Lungs/Pleura: Moderate left pleural effusion with near complete passive atelectasis of the left lower lobe. Some dependent subsegmental atelectasis is also noted in the left upper lobe. Trace right pleural effusion. Throughout the remaining portions of the lungs there is a background of ground-glass attenuation and mild interlobular septal thickening, indicative of a background of mild interstitial pulmonary edema. 5 mm right middle lobe pulmonary nodule (axial image 47 of series 7), stable dating back to at least 09/01/2013, considered definitively benign. Upper Abdomen: Aortic atherosclerosis. Musculoskeletal: There are no aggressive appearing lytic or blastic lesions noted in the visualized portions of the skeleton. Review of the MIP images confirms the above findings. IMPRESSION: 1. No evidence of pulmonary embolism. 2. Extensive pericardial enhancement with moderate volume of pericardial fluid, indicative of an acute pericarditis. 3. This is associated with increasing mediastinal lymphadenopathy compared to the prior study. 4. In addition, there is evidence of mild pulmonary edema and bilateral pleural effusions (left greater than right) with extensive areas of passive atelectasis in the left lung. 5. Aortic atherosclerosis, in addition to left anterior descending coronary artery disease. Please note that although the presence of coronary artery calcium documents the presence of coronary artery disease, the severity of this disease and any potential stenosis cannot be assessed on this non-gated CT examination. Assessment for potential risk factor modification, dietary therapy or pharmacologic therapy may be warranted, if clinically indicated. Aortic Atherosclerosis (ICD10-I70.0). Electronically Signed   By: Trudie Reed M.D.   On: 07/29/2017 19:45    ASSESSMENT / PLAN: Acute  on chronic hypoxic respiratory failure secondary to  secondary to pulmonary edema and complete opacification of left hemithorax secondary to mucous  plugging vs. large left pleural effusion Pericardial Effusion Chest Pain   Hypotension secondary to antihypertensive  Acute renal failure-resolved  Hypokalemia  Hx: Tobacco Abuse, COPD, Chronic Home O2, HTN, and Morbid Obesity P: Supplemental O2 for dyspnea and/or hypoxia  Prn bronchodilator therapy  Aggressive pulmonary hygiene q4hr Chest Physiotherapy  Repeat CXR in am  May need left sided thoracentesis  Continuous telemetry monitoring  Trend troponin's Cardiology consulted appreciate input  Maintain map >65  Hold outpatient antihypertensives  Trend WBC and monitor fever curve Trend PCT if elevated will start abx Follow cultures  VTE px: subq heparin  Trend CBC  Monitor for s/sx of bleeding and transfuse for hgb <7 Trend BMP  Replace electrolytes as indicated  Monitor UOP  Prn percocet for pain management   Sonda Rumble, AGNP  Pulmonary/Critical Care Pager [phone] (please enter 7 digits) PCCM Consult Pager [phone] (please enter 7 digits)

## 2017-07-30 NOTE — Progress Notes (Signed)
Placed patient on high flow nasal cannula so that she would be able to eat/drink better. 60%35 liters. 89 to 92% patient tolerated well

## 2017-07-31 ENCOUNTER — Inpatient Hospital Stay: Payer: Medicaid Other

## 2017-07-31 DIAGNOSIS — F17210 Nicotine dependence, cigarettes, uncomplicated: Secondary | ICD-10-CM

## 2017-07-31 DIAGNOSIS — N172 Acute kidney failure with medullary necrosis: Secondary | ICD-10-CM

## 2017-07-31 DIAGNOSIS — J9819 Other pulmonary collapse: Secondary | ICD-10-CM

## 2017-07-31 DIAGNOSIS — D751 Secondary polycythemia: Secondary | ICD-10-CM

## 2017-07-31 DIAGNOSIS — J942 Hemothorax: Secondary | ICD-10-CM

## 2017-07-31 DIAGNOSIS — J9601 Acute respiratory failure with hypoxia: Secondary | ICD-10-CM

## 2017-07-31 DIAGNOSIS — E0965 Drug or chemical induced diabetes mellitus with hyperglycemia: Secondary | ICD-10-CM

## 2017-07-31 DIAGNOSIS — J9621 Acute and chronic respiratory failure with hypoxia: Principal | ICD-10-CM

## 2017-07-31 DIAGNOSIS — Z9911 Dependence on respirator [ventilator] status: Secondary | ICD-10-CM

## 2017-07-31 LAB — BASIC METABOLIC PANEL
Anion gap: 11 (ref 5–15)
BUN: 23 mg/dL — ABNORMAL HIGH (ref 6–20)
CO2: 32 mmol/L (ref 22–32)
Calcium: 8.5 mg/dL — ABNORMAL LOW (ref 8.9–10.3)
Chloride: 93 mmol/L — ABNORMAL LOW (ref 101–111)
Creatinine, Ser: 1.49 mg/dL — ABNORMAL HIGH (ref 0.44–1.00)
GFR calc Af Amer: 45 mL/min — ABNORMAL LOW (ref >60)
GFR, EST NON AFRICAN AMERICAN: 39 mL/min — AB (ref >60)
GLUCOSE: 125 mg/dL — AB (ref 65–99)
POTASSIUM: 3.6 mmol/L (ref 3.5–5.1)
SODIUM: 136 mmol/L (ref 135–145)

## 2017-07-31 LAB — BLOOD GAS, ARTERIAL
Acid-Base Excess: 6.2 mmol/L — ABNORMAL HIGH (ref 0.0–2.0)
Bicarbonate: 37.3 mmol/L — ABNORMAL HIGH (ref 20.0–28.0)
FIO2: 100
LHR: 20 {breaths}/min
MECHVT: 400 mL
O2 SAT: 95.6 %
PATIENT TEMPERATURE: 37
PEEP: 5 cmH2O
PH ART: 7.25 — AB (ref 7.350–7.450)
pCO2 arterial: 85 mmHg (ref 32.0–48.0)
pO2, Arterial: 91 mmHg (ref 83.0–108.0)

## 2017-07-31 LAB — ECHOCARDIOGRAM LIMITED

## 2017-07-31 LAB — GLUCOSE, CAPILLARY
GLUCOSE-CAPILLARY: 159 mg/dL — AB (ref 65–99)
Glucose-Capillary: 153 mg/dL — ABNORMAL HIGH (ref 65–99)
Glucose-Capillary: 154 mg/dL — ABNORMAL HIGH (ref 65–99)
Glucose-Capillary: 168 mg/dL — ABNORMAL HIGH (ref 65–99)
Glucose-Capillary: 219 mg/dL — ABNORMAL HIGH (ref 65–99)

## 2017-07-31 LAB — ECHOCARDIOGRAM COMPLETE: Weight: 4184 oz

## 2017-07-31 LAB — URINE CULTURE

## 2017-07-31 LAB — PROTIME-INR
INR: 1.21
PROTHROMBIN TIME: 15.2 s (ref 11.4–15.2)

## 2017-07-31 LAB — PROCALCITONIN: PROCALCITONIN: 0.13 ng/mL

## 2017-07-31 LAB — APTT: aPTT: 37 seconds — ABNORMAL HIGH (ref 24–36)

## 2017-07-31 LAB — BRAIN NATRIURETIC PEPTIDE: B NATRIURETIC PEPTIDE 5: 53 pg/mL (ref 0.0–100.0)

## 2017-07-31 LAB — MRSA PCR SCREENING: MRSA BY PCR: NEGATIVE

## 2017-07-31 LAB — TRIGLYCERIDES: TRIGLYCERIDES: 121 mg/dL (ref <150)

## 2017-07-31 MED ORDER — FENTANYL CITRATE (PF) 100 MCG/2ML IJ SOLN: 100.0000 ug | INTRAMUSCULAR | Status: DC | PRN

## 2017-07-31 MED ORDER — ETOMIDATE 2 MG/ML IV SOLN
20.0000 mg | Freq: Once | INTRAVENOUS | Status: AC
  Administered 2017-07-31 (×2): 20 mg via INTRAVENOUS

## 2017-07-31 MED ORDER — FAMOTIDINE IN NACL 20-0.9 MG/50ML-% IV SOLN
20.0000 mg | Freq: Two times a day (BID) | INTRAVENOUS | Status: DC
  Administered 2017-07-31 – 2017-08-01 (×4): 20 mg via INTRAVENOUS
  Filled 2017-07-31 (×4): qty 50

## 2017-07-31 MED ORDER — CHLORHEXIDINE GLUCONATE 0.12% ORAL RINSE (MEDLINE KIT)
15.0000 mL | Freq: Two times a day (BID) | OROMUCOSAL | Status: DC
  Administered 2017-07-31 – 2017-08-13 (×26): 15 mL via OROMUCOSAL

## 2017-07-31 MED ORDER — FENTANYL CITRATE (PF) 100 MCG/2ML IJ SOLN
100.0000 ug | INTRAMUSCULAR | Status: AC | PRN
  Administered 2017-07-31 – 2017-08-01 (×3): 100 ug via INTRAVENOUS
  Filled 2017-07-31 (×2): qty 2

## 2017-07-31 MED ORDER — BISACODYL 10 MG RE SUPP
10.0000 mg | Freq: Every day | RECTAL | Status: DC | PRN
  Administered 2017-08-04: 10 mg via RECTAL
  Filled 2017-07-31 (×2): qty 1

## 2017-07-31 MED ORDER — FENTANYL CITRATE (PF) 100 MCG/2ML IJ SOLN
INTRAMUSCULAR | Status: AC
  Filled 2017-07-31: qty 2

## 2017-07-31 MED ORDER — ROCURONIUM BROMIDE 50 MG/5ML IV SOLN
INTRAVENOUS | Status: AC
  Administered 2017-07-31: 50 mg via INTRAVENOUS
  Filled 2017-07-31: qty 1

## 2017-07-31 MED ORDER — INSULIN ASPART 100 UNIT/ML ~~LOC~~ SOLN
0.0000 [IU] | SUBCUTANEOUS | Status: DC
  Administered 2017-07-31: 3 [IU] via SUBCUTANEOUS
  Administered 2017-07-31: 5 [IU] via SUBCUTANEOUS
  Administered 2017-07-31 – 2017-08-01 (×3): 3 [IU] via SUBCUTANEOUS
  Administered 2017-08-01: 2 [IU] via SUBCUTANEOUS
  Administered 2017-08-01 (×2): 3 [IU] via SUBCUTANEOUS
  Administered 2017-08-02 (×2): 2 [IU] via SUBCUTANEOUS
  Administered 2017-08-02 – 2017-08-03 (×5): 3 [IU] via SUBCUTANEOUS
  Administered 2017-08-03 – 2017-08-04 (×2): 2 [IU] via SUBCUTANEOUS
  Administered 2017-08-04: 3 [IU] via SUBCUTANEOUS
  Administered 2017-08-04: 2 [IU] via SUBCUTANEOUS
  Administered 2017-08-04: 3 [IU] via SUBCUTANEOUS
  Administered 2017-08-04: 2 [IU] via SUBCUTANEOUS
  Administered 2017-08-04 – 2017-08-05 (×6): 3 [IU] via SUBCUTANEOUS
  Administered 2017-08-05: 2 [IU] via SUBCUTANEOUS
  Administered 2017-08-06 (×4): 3 [IU] via SUBCUTANEOUS
  Administered 2017-08-06: 2 [IU] via SUBCUTANEOUS
  Administered 2017-08-06 – 2017-08-07 (×3): 3 [IU] via SUBCUTANEOUS
  Administered 2017-08-07 (×4): 2 [IU] via SUBCUTANEOUS
  Administered 2017-08-08: 3 [IU] via SUBCUTANEOUS
  Administered 2017-08-08: 2 [IU] via SUBCUTANEOUS
  Administered 2017-08-08: 3 [IU] via SUBCUTANEOUS
  Administered 2017-08-08 (×2): 2 [IU] via SUBCUTANEOUS
  Administered 2017-08-09: 3 [IU] via SUBCUTANEOUS
  Administered 2017-08-09: 2 [IU] via SUBCUTANEOUS
  Administered 2017-08-09 (×2): 3 [IU] via SUBCUTANEOUS
  Administered 2017-08-09 – 2017-08-10 (×2): 5 [IU] via SUBCUTANEOUS
  Administered 2017-08-10: 2 [IU] via SUBCUTANEOUS
  Administered 2017-08-10: 5 [IU] via SUBCUTANEOUS
  Administered 2017-08-10 – 2017-08-11 (×3): 2 [IU] via SUBCUTANEOUS
  Administered 2017-08-11 (×2): 3 [IU] via SUBCUTANEOUS
  Administered 2017-08-11 (×3): 2 [IU] via SUBCUTANEOUS
  Administered 2017-08-12 (×5): 3 [IU] via SUBCUTANEOUS
  Administered 2017-08-12: 2 [IU] via SUBCUTANEOUS
  Administered 2017-08-13 (×3): 3 [IU] via SUBCUTANEOUS
  Administered 2017-08-13: 2 [IU] via SUBCUTANEOUS
  Administered 2017-08-13 (×3): 3 [IU] via SUBCUTANEOUS
  Administered 2017-08-14 – 2017-08-15 (×5): 2 [IU] via SUBCUTANEOUS
  Filled 2017-07-31 (×78): qty 1

## 2017-07-31 MED ORDER — LORAZEPAM 2 MG/ML IJ SOLN
INTRAMUSCULAR | Status: AC
  Administered 2017-07-31: 1 mg via INTRAVENOUS
  Filled 2017-07-31: qty 1

## 2017-07-31 MED ORDER — IPRATROPIUM-ALBUTEROL 0.5-2.5 (3) MG/3ML IN SOLN
RESPIRATORY_TRACT | Status: AC
  Filled 2017-07-31: qty 3

## 2017-07-31 MED ORDER — DOCUSATE SODIUM 50 MG/5ML PO LIQD: 100.0000 mg | Freq: Two times a day (BID) | ORAL | Status: DC | PRN

## 2017-07-31 MED ORDER — GUAIFENESIN ER 600 MG PO TB12
600.0000 mg | ORAL_TABLET | Freq: Two times a day (BID) | ORAL | Status: DC
  Administered 2017-07-31: 600 mg via ORAL
  Filled 2017-07-31: qty 1

## 2017-07-31 MED ORDER — ORAL CARE MOUTH RINSE
15.0000 mL | OROMUCOSAL | Status: DC
  Administered 2017-07-31 – 2017-08-14 (×131): 15 mL via OROMUCOSAL

## 2017-07-31 MED ORDER — FENTANYL CITRATE (PF) 100 MCG/2ML IJ SOLN
INTRAMUSCULAR | Status: AC
  Administered 2017-07-31: 100 ug via INTRAVENOUS
  Filled 2017-07-31: qty 2

## 2017-07-31 MED ORDER — BUDESONIDE 0.25 MG/2ML IN SUSP
0.2500 mg | Freq: Two times a day (BID) | RESPIRATORY_TRACT | Status: DC
  Administered 2017-07-31: 0.25 mg via RESPIRATORY_TRACT
  Filled 2017-07-31: qty 2

## 2017-07-31 MED ORDER — BUDESONIDE 0.5 MG/2ML IN SUSP
0.5000 mg | Freq: Two times a day (BID) | RESPIRATORY_TRACT | Status: DC
  Administered 2017-07-31 – 2017-08-01 (×2): 0.5 mg via RESPIRATORY_TRACT
  Filled 2017-07-31 (×2): qty 2

## 2017-07-31 MED ORDER — FENTANYL CITRATE (PF) 100 MCG/2ML IJ SOLN
100.0000 ug | INTRAMUSCULAR | Status: DC | PRN
  Administered 2017-08-01 – 2017-08-02 (×4): 100 ug via INTRAVENOUS
  Filled 2017-07-31 (×4): qty 2

## 2017-07-31 MED ORDER — LORAZEPAM 2 MG/ML IJ SOLN
1.0000 mg | Freq: Once | INTRAMUSCULAR | Status: AC
  Administered 2017-07-31: 1 mg via INTRAVENOUS

## 2017-07-31 MED ORDER — LACTATED RINGERS IV SOLN
INTRAVENOUS | Status: AC
  Administered 2017-07-31: 75 mL/h via INTRAVENOUS
  Administered 2017-08-01 (×2): via INTRAVENOUS

## 2017-07-31 MED ORDER — PROPOFOL 1000 MG/100ML IV EMUL
0.0000 ug/kg/min | INTRAVENOUS | Status: DC
  Administered 2017-07-31: 40 ug/kg/min via INTRAVENOUS
  Administered 2017-07-31: 50 ug/kg/min via INTRAVENOUS
  Administered 2017-07-31: 20 ug/kg/min via INTRAVENOUS
  Administered 2017-07-31: 40 ug/kg/min via INTRAVENOUS
  Administered 2017-08-01: 45 ug/kg/min via INTRAVENOUS
  Administered 2017-08-01: 40 ug/kg/min via INTRAVENOUS
  Administered 2017-08-01 (×6): 50 ug/kg/min via INTRAVENOUS
  Administered 2017-08-02: 25 ug/kg/min via INTRAVENOUS
  Administered 2017-08-02 (×2): 50 ug/kg/min via INTRAVENOUS
  Filled 2017-07-31 (×16): qty 100

## 2017-07-31 MED ORDER — METHYLPREDNISOLONE SODIUM SUCC 40 MG IJ SOLR
40.0000 mg | Freq: Two times a day (BID) | INTRAMUSCULAR | Status: DC
  Administered 2017-07-31 – 2017-08-01 (×3): 40 mg via INTRAVENOUS
  Filled 2017-07-31 (×3): qty 1

## 2017-07-31 MED ORDER — ALBUTEROL SULFATE (2.5 MG/3ML) 0.083% IN NEBU
2.5000 mg | INHALATION_SOLUTION | RESPIRATORY_TRACT | Status: DC | PRN
  Administered 2017-08-08: 2.5 mg via RESPIRATORY_TRACT
  Filled 2017-07-31: qty 3

## 2017-07-31 MED ORDER — FENTANYL CITRATE (PF) 100 MCG/2ML IJ SOLN
100.0000 ug | Freq: Once | INTRAMUSCULAR | Status: AC
  Administered 2017-07-31 (×2): 100 ug via INTRAVENOUS

## 2017-07-31 MED ORDER — ETOMIDATE 2 MG/ML IV SOLN
INTRAVENOUS | Status: AC
  Administered 2017-07-31: 20 mg via INTRAVENOUS
  Filled 2017-07-31: qty 10

## 2017-07-31 MED ORDER — CEFEPIME HCL 2 G IJ SOLR
2.0000 g | Freq: Three times a day (TID) | INTRAMUSCULAR | Status: AC
  Administered 2017-07-31 – 2017-08-07 (×21): 2 g via INTRAVENOUS
  Filled 2017-07-31 (×21): qty 2

## 2017-07-31 MED ORDER — IPRATROPIUM-ALBUTEROL 0.5-2.5 (3) MG/3ML IN SOLN
3.0000 mL | Freq: Four times a day (QID) | RESPIRATORY_TRACT | Status: DC
  Administered 2017-07-31 – 2017-08-04 (×18): 3 mL via RESPIRATORY_TRACT
  Filled 2017-07-31 (×17): qty 3

## 2017-07-31 MED ORDER — ROCURONIUM BROMIDE 50 MG/5ML IV SOLN
50.0000 mg | Freq: Once | INTRAVENOUS | Status: AC
  Administered 2017-07-31 (×2): 50 mg via INTRAVENOUS

## 2017-07-31 NOTE — Progress Notes (Signed)
SUBJECTIVE: patient is going to be intubated as was having respiratory failure   Vitals:   07/31/17 0400 07/31/17 0500 07/31/17 0721 07/31/17 0722  BP: 94/65 97/60    Pulse: 94 100  90  Resp: (!) 21 (!) 22  20  Temp:      TempSrc:      SpO2: (!) 85% (!) 86% 93% 93%  Weight:      Height:        Intake/Output Summary (Last 24 hours) at 07/31/2017 0851 Last data filed at 07/31/2017 0630 Gross per 24 hour  Intake 120 ml  Output 750 ml  Net -630 ml    LABS: Basic Metabolic Panel: Recent Labs    07/30/17 0459 07/31/17 0648  NA 136 136  K 3.2* 3.6  CL 91* 93*  CO2 30 32  GLUCOSE 146* 125*  BUN 13 23*  CREATININE 0.96 1.49*  CALCIUM 8.7* 8.5*   Liver Function Tests: Recent Labs    07/29/17 2235  AST 38  ALT 36  ALKPHOS 100  BILITOT 2.1*  PROT 7.5  ALBUMIN 3.2*   No results for input(s): LIPASE, AMYLASE in the last 72 hours. CBC: Recent Labs    07/29/17 1646 07/29/17 2235 07/30/17 0459  WBC 8.2 12.9* 11.6*  NEUTROABS 6.7*  --   --   HGB 16.8* 15.9 15.8  HCT 54.2* 49.1* 51.6*  MCV 77.7* 76.8* 78.5*  PLT 393 376 382   Cardiac Enzymes: Recent Labs    07/29/17 1646  TROPONINI <0.03   BNP: Invalid input(s): POCBNP D-Dimer: No results for input(s): DDIMER in the last 72 hours. Hemoglobin A1C: No results for input(s): HGBA1C in the last 72 hours. Fasting Lipid Panel: No results for input(s): CHOL, HDL, LDLCALC, TRIG, CHOLHDL, LDLDIRECT in the last 72 hours. Thyroid Function Tests: No results for input(s): TSH, T4TOTAL, T3FREE, THYROIDAB in the last 72 hours.  Invalid input(s): FREET3 Anemia Panel: No results for input(s): VITAMINB12, FOLATE, FERRITIN, TIBC, IRON, RETICCTPCT in the last 72 hours.   PHYSICAL EXAM General: Well developed, well nourished, in no acute distress HEENT:  Normocephalic and atramatic Neck:  No JVD.  Lungs: Clear bilaterally to auscultation and percussion. Heart: HRRR . Normal S1 and S2 without gallops or murmurs.   Abdomen: Bowel sounds are positive, abdomen soft and non-tender  Msk:  Back normal, normal gait. Normal strength and tone for age. Extremities: No clubbing, cyanosis or edema.   Neuro: Alert and oriented X 3. Psych:  Good affect, responds appropriately  TELEMETRY: sinus rhythm  ASSESSMENT AND PLAN: was left-sided pleural effusion with respiratory failure and small pericardial effusion without tamponade. Patient is going to be intubated and possible have thoracocentesis.  Principal Problem:   Acute on chronic respiratory failure with hypoxia (HCC) Active Problems:   Acute on chronic respiratory failure (HCC)    [name] A, MD, [facility] 07/31/2017 8:51 AM

## 2017-07-31 NOTE — Progress Notes (Signed)
Pharmacy Antibiotic Note  Angel MeekerSonya D [name] is a 52 y.o. female admitted on 07/29/2017 with HCAP.  Pharmacy has been consulted for cefepime dosing.  Plan: Cefepime 2 grams q 8 hours ordered  Height: 5' (152.4 cm) Weight: 261 lb 7.5 oz (118.6 kg) IBW/kg (Calculated) : 45.5  Temp (24hrs), Avg:98.4 F (36.9 C), Min:98.1 F (36.7 C), Max:98.7 F (37.1 C)  Recent Labs  Lab 07/29/17 1646 07/29/17 2001 07/29/17 2235 07/30/17 0459  WBC 8.2  --  12.9* 11.6*  CREATININE 1.08*  --  0.86 0.96  LATICACIDVEN  --  1.0  --   --     Estimated Creatinine Clearance: 79.9 mL/min (by C-G formula based on SCr of 0.96 mg/dL).    No Known Allergies  Antimicrobials this admission: vanc x1 3/17 Cefepime 3/17  >>    >>   Dose adjustments this admission:   Microbiology results: 3/17 UCx: pending  3/19 Sputum: pending  3/19 MRSA PCR: (-)      3/19 CXR: pending Thank you for allowing pharmacy to be a part of this patient's care.  Trayon Krantz S 07/31/2017 6:27 AM

## 2017-07-31 NOTE — Progress Notes (Signed)
Name: Angel MeekerSonya D Marker MRN: [id number] DOB: [birth year]    ADMISSION DATE:  07/29/2017 CONSULTATION DATE: 07/30/2017  REFERRING MD : Dr. Sherryll BurgerShah   CHIEF COMPLAINT:   BRIEF PATIENT DESCRIPTION:  [age] F presented to ED 03/17 and admitted to hospitalist service with hypoxemic respiratory failure, atypical chest pain, pericardial effusion (no tamponade by Echo).  Transferred to SDU 03/18 with worsening hypoxemia and complete opacification of left hemithorax on CXR.  Initially supported with BiPAP.  Intubated AM 03/19.  Very difficult airway!!!  MAJOR EVENTS/TEST RESULTS: 03/17 admission as documented above 03/17 CTA chest: No PE.  Moderate pericardial effusion and pericardial enhancement suggesting acute pericarditis.  Left greater than right pleural effusions with LLL atelectasis versus infiltrate. 03/17 TTE: LVEF 85%.  Grade 1 diastolic dysfunction.  No evidence of cardiac tamponade 03/18 repeat TTE: LVEF 75%.  Grade 1 diastolic dysfunction.  Small anterior and posterior pericardial effusion.  Moderate left pleural effusion.  No evidence of tamponade.  Mildly dilated RA, moderately dilated RV 03/19 CT chest: Extensive consolidation of left lung with minimal aeration of LUL.  Moderate left pleural effusion.  Patchy airspace disease in right lung.  INDWELLING DEVICES:: ETT 03/19 >>  L [place] CVL 03/19 >>   MICRO DATA: MRSA PCR 03/19 >> NEG Urine 03/17 >> multiple species present suggesting contaminated specimen Resp 03/19 >>   ANTIMICROBIALS:  Vanc 03/17 >> 03/17 Cefepime 03/17 >>     SUBJECTIVE:  Upon initial evaluation this morning, she was on BiPAP with no overt distress but with marginal oxygenation despite 100% FiO2.  Her cognition appeared intact but communication was difficult due to the BiPAP mask.  We elected to intubate her to further evaluate the opacification of the left hemithorax with CT scan of chest.  VITAL SIGNS: Temp:  [98 F (36.7 C)-98.7 F (37.1 C)] 98 F (36.7 C)  (03/19 1200) Pulse Rate:  [90-104] 100 (03/19 1200) Resp:  [18-33] 20 (03/19 1200) BP: (88-125)/(49-79) 88/59 (03/19 1200) SpO2:  [85 %-95 %] 90 % (03/19 1532) FiO2 (%):  [60 %-100 %] 60 % (03/19 1532) Weight:  [118.6 kg (261 lb 7.5 oz)] 118.6 kg (261 lb 7.5 oz) (03/19 0126)   HEMODYNAMICS:    VENTILATOR SETTINGS: Vent Mode: PRVC FiO2 (%):  [60 %-100 %] 60 % Set Rate:  [20 bmp] 20 bmp Vt Set:  [400 mL] 400 mL PEEP:  [5 cmH20] 5 cmH20 Plateau Pressure:  [26 cmH20-39 cmH20] 26 cmH20  INTAKE / OUTPUT: I/O last 3 completed shifts: In: 720 [P.O.:120; IV Piggyback:600] Out: 1300 [Urine:1300]  PHYSICAL EXAMINATION: General: Obese, cognition intact, hypoxemic on BiPAP mask Neuro: CNs intact, moves all extremities, DTRs symmetric HEENT: NCAT, sclerae white Cardiovascular: Distant heart sounds, no murmur noted, no rub Lungs: Breath sounds markedly diminished on the left, scattered wheezes on right Abdomen: Markedly obese, soft, NT, diminished BS   Extremities: Minimal symmetric ankle and pretibial edema Skin: No lesions noted   LABS:  BMET Recent Labs  Lab 07/29/17 2235 07/30/17 0459 07/31/17 0648  NA 136 136 136  K 3.4* 3.2* 3.6  CL 93* 91* 93*  CO2 32 30 32  BUN 10 13 23*  CREATININE 0.86 0.96 1.49*  GLUCOSE 162* 146* 125*    Electrolytes Recent Labs  Lab 07/29/17 2235 07/30/17 0459 07/31/17 0648  CALCIUM 8.5* 8.7* 8.5*    CBC Recent Labs  Lab 07/29/17 1646 07/29/17 2235 07/30/17 0459  WBC 8.2 12.9* 11.6*  HGB 16.8* 15.9 15.8  HCT 54.2* 49.1* 51.6*  PLT 393 376 382    Coag's Recent Labs  Lab 07/31/17 0701  APTT 37*  INR 1.21    Sepsis Markers Recent Labs  Lab 07/29/17 2001 07/29/17 2235 07/30/17 0459 07/31/17 0648  LATICACIDVEN 1.0  --   --   --   PROCALCITON  --  0.10 0.20 0.13    ABG Recent Labs  Lab 07/31/17 1146  PHART 7.25*  PCO2ART 85*  PO2ART 91    Liver Enzymes Recent Labs  Lab 07/29/17 2235  AST 38  ALT 36    ALKPHOS 100  BILITOT 2.1*  ALBUMIN 3.2*    Cardiac Enzymes Recent Labs  Lab 07/29/17 1646  TROPONINI <0.03    Glucose Recent Labs  Lab 07/31/17 0119 07/31/17 1141 07/31/17 1507  GLUCAP 159* 219* 168*    CXR: Complete opacification of left hemithorax.  Vascular congestion suggested in right lung    ASSESSMENT / PLAN:  PULMONARY A: Acute hypoxemic respiratory failure Opacification of left hemithorax -suspect combination of PNA, atelectasis, effusion Smoker Suspect OSA (based on daughter's history) Very difficult intubation P:   Vent settings established Vent bundle implemented Daily SBT as indicated Nebulized steroids and bronchodilators Continue systemic steroids at current dose for now Might consider early trach due to very difficult airway and highly likely severe OSA  CARDIOVASCULAR A:  Pericardial effusion without evidence of tamponade P:  Hemodynamic monitoring MAP goal >65 mmHg  RENAL A:   AKI, nonoliguric P:   Monitor BMET intermittently Monitor I/Os Correct electrolytes as indicated   GASTROINTESTINAL A:   Severe obesity P:   SUP: IV famotidine Consider TF protocol 03/20  HEMATOLOGIC A:   Polycythemia (suggests chronic hypoxemia) P:  DVT px: SQ heparin Monitor CBC intermittently Transfuse per usual guidelines   INFECTIOUS A:   Suspected pneumonia Possible acute pericarditis P:   Monitor temp, WBC count Micro and abx as above   ENDOCRINE A:   Hyperglycemia without documented history of diabetes Steroid-induced hyperglycemia P:   Moderate scale SSI  NEUROLOGIC A:   ICU/ventilator associated discomfort P:   RASS goal: -2, -3 PAD protocol - propofol, intermittent fentanyl   FAMILY  - Updates: Husband, daughter, cousin and friend updated at bedside in detail discussing her current medical status.  We discussed in detail my concerns regarding her airway and the challenge this will present when the time comes to  consider extubation.  I opened up the discussion of possible early tracheostomy tube which would obviate the risk of airway issues after extubation and would also treat what I presume to be OSA  CCM time: 75 mins The above time includes time spent in consultation with patient and/or family members and reviewing care plan on multidisciplinary rounds  Billy Fischer, MD PCCM service Mobile [phone] Pager [phone] 07/31/2017, 3:50 PM

## 2017-07-31 NOTE — Progress Notes (Signed)
Patient ID: [name], female   DOB: 1965-12-04, 52 y.o.   MRN: [id number]  Chief Complaint  Patient presents with  . Chest Pain    Referred By Dr. Wandra Scot Reason for Referral left pleural effusion  HPI Location, Quality, Duration, Severity, Timing, Context, Modifying Factors, Associated Signs and Symptoms.  [name] is a 52 y.o. female.  She was admitted to the hospital several days ago with increasing shortness of breath.  A chest x-ray was obtained showing what was initially felt to be a left-sided pneumonia with a small pleural effusion.  A CT scan confirmed the presence of infiltrate in the left lung as well as a small left pleural effusion and a pericardial effusion.  Her chest x-ray and her clinical condition worsened over the next 24 hours that she required intubation today followed by a repeat CT scan.  The repeat CT scan continues to show a small pleural effusion on the left as well as a pericardial effusion and a more extensive left pneumonia.  I was asked to see the patient for consideration of drainage of her pleural effusion.  She also had an echocardiogram which did not reveal any evidence of [place] nod.   Past Medical History:  Diagnosis Date  . Anxiety   . COPD (chronic obstructive pulmonary disease) (HCC)   . Essential hypertension   . History of echocardiogram    a. 08/2013 Echo: EF 55-60%, impaired LV relaxation, mild LVH, nl RV fxn, nl RVSP, mild TR; b. 05/2017 Echo: EF 65-70%, no rwma, nl RV fxn.  . Morbid obesity (HCC)   . Tobacco abuse     Past Surgical History:  Procedure Laterality Date  . ABDOMINAL HYSTERECTOMY    . CHOLECYSTECTOMY    . HERNIA REPAIR      Family History  Problem Relation Age of Onset  . Dementia Mother   . Heart attack Father     Social History Social History   Tobacco Use  . Smoking status: Current Every Day Smoker    Packs/day: 0.75    Years: 40.00    Pack years: 30.00    Types: Cigarettes  . Smokeless tobacco:  Never Used  Substance Use Topics  . Alcohol use: No  . Drug use: No    No Known Allergies  Current Facility-Administered Medications  Medication Dose Route Frequency Provider Last Rate Last Dose  . albuterol (PROVENTIL) (2.5 MG/3ML) 0.083% nebulizer solution 2.5 mg  2.5 mg Inhalation Q3H PRN Merwyn Katos, MD      . bisacodyl (DULCOLAX) suppository 10 mg  10 mg Rectal Daily PRN Merwyn Katos, MD      . budesonide (PULMICORT) nebulizer solution 0.5 mg  0.5 mg Nebulization BID Merwyn Katos, MD      . ceFEPIme (MAXIPIME) 2 g in sodium chloride 0.9 % 100 mL IVPB  2 g Intravenous Q8H Eugenie Norrie, NP   Stopped at 07/31/17 1036  . docusate (COLACE) 50 MG/5ML liquid 100 mg  100 mg Per Tube BID PRN Merwyn Katos, MD      . famotidine (PEPCID) IVPB 20 mg premix  20 mg Intravenous Q12H Merwyn Katos, MD   Stopped at 07/31/17 1220  . fentaNYL (SUBLIMAZE) 100 MCG/2ML injection           . fentaNYL (SUBLIMAZE) injection 100 mcg  100 mcg Intravenous Q15 min PRN Merwyn Katos, MD   100 mcg at 07/31/17 1151  . fentaNYL (SUBLIMAZE) injection 100  mcg  100 mcg Intravenous Q2H PRN Merwyn KatosSimonds, David B, MD      . heparin injection 5,000 Units  5,000 Units Subcutaneous Q8H Altamese DillingVachhani, Vaibhavkumar, MD   5,000 Units at 07/30/17 2228  . insulin aspart (novoLOG) injection 0-15 Units  0-15 Units Subcutaneous Q4H Merwyn KatosSimonds, David B, MD   5 Units at 07/31/17 1149  . ipratropium-albuterol (DUONEB) 0.5-2.5 (3) MG/3ML nebulizer solution 3 mL  3 mL Nebulization Q6H Eugenie NorrieBlakeney, Dana G, NP   3 mL at 07/31/17 1417  . ipratropium-albuterol (DUONEB) 0.5-2.5 (3) MG/3ML nebulizer solution           . lactated ringers infusion   Intravenous Continuous Merwyn KatosSimonds, David B, MD      . methylPREDNISolone sodium succinate (SOLU-MEDROL) 40 mg/mL injection 40 mg  40 mg Intravenous Q12H Eugenie NorrieBlakeney, Dana G, NP   40 mg at 07/31/17 0618  . oxyCODONE-acetaminophen (PERCOCET/ROXICET) 5-325 MG per tablet 1 tablet  1 tablet Oral Q6H PRN  Altamese DillingVachhani, Vaibhavkumar, MD   1 tablet at 07/29/17 2259  . propofol (DIPRIVAN) 1000 MG/100ML infusion  0-50 mcg/kg/min Intravenous Continuous Merwyn KatosSimonds, David B, MD 28.5 mL/hr at 07/31/17 1239 40 mcg/kg/min at 07/31/17 1239      Review of Systems A complete review of systems was asked and was negative except for the following positive findings unobtainable because the patient is intubated  Blood pressure (!) 88/59, pulse 100, temperature 98 F (36.7 C), temperature source Axillary, resp. rate 20, height 5' (1.524 m), weight 261 lb 7.5 oz (118.6 kg), SpO2 (!) 88 %.  Physical Exam CONSTITUTIONAL: Intubated and sedated EYES: Pupils equal and reactive to light, Sclera non-icteric LYMPH NODES:  Lymph nodes in the neck and axillae were normal CARDIOVASCULAR: Heart was regular without murmurs.  There were no carotid bruits. GI: The abdomen was soft, nontender, and nondistended. There were no palpable masses. There was no hepatosplenomegaly. There were normal bowel sounds in all quadrants.  SKIN:  There were no pathologic skin lesions.  There were no nodules on palpation.   Data Reviewed Chest x-rays and CT scans  I have personally reviewed the patient's imaging, laboratory findings and medical records.    Assessment    Left-sided pneumonia with left-sided pleural effusion Pericardial effusion Morbid obesity    Plan    Her CT scan shows a left-sided pneumonia with a small amount of left pleural effusion.  I do not believe that she would benefit from a chest tube at this time.  There is a small to moderate pericardial effusion.  Because the echo does not show any evidence of tamponade I would not drain this either.  I would continue to follow her clinically.  I would also recommend we repeat the chest x-ray tomorrow morning.       Hulda Marinimothy Kamika Goodloe, MD 07/31/2017, 2:57 PM

## 2017-07-31 NOTE — Progress Notes (Signed)
eLink Physician-Brief Progress Note Patient Name: Reinaldo MeekerSonya D Scheck DOB: January 20, 1966 MRN: [id number]   Date of Service  07/31/2017  HPI/Events of Note  52 yo female admitted to telemetry unit with chest pain, pericardial effusion and acute on chronic hypoxic respiratory failure secondary to possible LLL pneumonia and acute CHF exacerbation. Required transfer to stepdown unit on 03/18 with hypotension and worsening acute on chronic hypoxic respiratory failure secondary to pulmonary edema and complete opacification of left hemithorax secondary to mucous plugging vs. large left pleural effusion requiring HFNC. PCCM has seen the patient in consultation already. VSS.   eICU Interventions  No new orders.      Intervention Category Evaluation Type: New Patient Evaluation  Lenell AntuSommer,Zareen Jamison Eugene 07/31/2017, 1:27 AM

## 2017-07-31 NOTE — Progress Notes (Signed)
   07/31/17 0919  Clinical Encounter Type  Visited With Family;Health care provider  Visit Type Initial  Referral From Other (Comment) (Financial controller)  Consult/Referral To Estate manager/land agent paged chaplain regarding patient family support as patient was to be intubated.  Upon chaplain arrival, intubation had begun; chaplain checked with staff to learn family was in hallway.  Chaplain met with family; they reported that for the last two months family members had been in and out of the hospital.  Family would appreciated chaplain checking in periodically.

## 2017-07-31 NOTE — Procedures (Signed)
Indication:   Complete opacification of left hemithorax, rule out mucous plug   Premedication:   Patient already sedated for intubation   Procedure: After adequate sedation and anesthesia, the bronchoscope was introduced via the ETT and a cursory proximal airway examination was performed.  This revealed moderate mucoid secretions and no endobronchial tumors, masses or foreign bodies.  No specimens were obtained  Billy Fischeravid Simonds, MD PCCM service Mobile [phone] Pager [phone]  07/31/2017

## 2017-07-31 NOTE — Progress Notes (Signed)
   07/31/17 1515  Clinical Encounter Type  Visited With Patient;Health care provider  Visit Type Follow-up  Spiritual Encounters  Spiritual Needs Prayer   Attempted follow up with family.  Patient was in room with care provider.  Staff stated that family had been there recently.  Chaplain offered silent prayer for patient and family.

## 2017-07-31 NOTE — Progress Notes (Signed)
[facility] [place] [facility]   PATIENT NAME: [name]    MR#:  [id number]  DATE OF BIRTH:  03/08/1966  SUBJECTIVE:  CHIEF COMPLAINT:   Chief Complaint  Patient presents with  . Chest Pain  intubated today REVIEW OF SYSTEMS:  Review of Systems  Unable to perform ROS: Intubated    DRUG ALLERGIES:  No Known Allergies VITALS:  Blood pressure 109/69, pulse (!) 103, temperature 98.1 F (36.7 C), temperature source Axillary, resp. rate (!) 21, height 5' (1.524 m), weight 118.6 kg (261 lb 7.5 oz), SpO2 90 %. PHYSICAL EXAMINATION:  Physical Exam  Constitutional: She is well-developed, well-nourished, and in no distress.  HENT:  Head: Normocephalic and atraumatic.  ETT in place  Eyes: Conjunctivae and EOM are normal. Pupils are equal, round, and reactive to light.  Neck: Normal range of motion. Neck supple. No tracheal deviation present. No thyromegaly present.  Cardiovascular: Normal rate, regular rhythm and normal heart sounds.  Pulmonary/Chest: Accessory muscle usage present. She is in respiratory distress. She has decreased breath sounds in the left lower field. She has no wheezes. She has rhonchi in the left lower field. She exhibits no tenderness.  Abdominal: Soft. Bowel sounds are normal. She exhibits no distension. There is no tenderness.  Musculoskeletal: Normal range of motion.  Neurological: No cranial nerve deficit.  Sedated on vent  Skin: Skin is warm and dry. No rash noted.  Psychiatric:  Sedated on vent   LABORATORY PANEL:  Female CBC Recent Labs  Lab 07/30/17 0459  WBC 11.6*  HGB 15.8  HCT 51.6*  PLT 382   ------------------------------------------------------------------------------------------------------------------ Chemistries  Recent Labs  Lab 07/29/17 2235  07/31/17 0648  NA 136   < > 136  K 3.4*   < > 3.6  CL 93*   < > 93*  CO2 32   < > 32  GLUCOSE 162*   < > 125*  BUN 10   < > 23*  CREATININE 0.86   < > 1.49*    CALCIUM 8.5*   < > 8.5*  AST 38  --   --   ALT 36  --   --   ALKPHOS 100  --   --   BILITOT 2.1*  --   --    < > = values in this interval not displayed.   RADIOLOGY:  Ct Chest Wo Contrast  Result Date: 07/31/2017 CLINICAL DATA:  She presented to [facility] ER on 03/17 with c/o intermittent chest pain worse during inspiration and nonproductive cough onset 1 week prior to presentation. On 03/18 pt developed hypotension and worsening acute hypoxic respiratory failure. EXAM: CT CHEST WITHOUT CONTRAST TECHNIQUE: Multidetector CT imaging of the chest was performed following the standard protocol without IV contrast. COMPARISON:  07/29/2017, 06/10/2017 FINDINGS: Cardiovascular: Stable cardiomegaly. Small pericardial effusion. Dilatation of main pulmonary artery as well as the right and left main pulmonary arteries as can be seen with pulmonary arterial hypertension. Thoracic aorta is normal in caliber. Mediastinum/Nodes: Enlarged prevascular lymph node measuring 17 mm in short axis. Multiple other smaller mediastinal lymph nodes are noted. Thyroid gland, trachea, and esophagus demonstrate no significant findings. Lungs/Pleura: Trace right pleural effusion. Small left pleural effusion. Left lower lobe airspace disease with air bronchograms concerning for pneumonia. Improved aeration of left upper lobe. Mild persistent right upper lobe airspace disease likely reflecting atelectasis. Stable 6 mm right middle lobe pulmonary nodule unchanged compared with 09/01/2013. Upper Abdomen: Aortic atherosclerosis. Musculoskeletal: No acute osseous  abnormality. No aggressive osseous lesion. IMPRESSION: 1. Left lower lobe airspace disease with air bronchograms concerning for combination of pneumonia and atelectasis. Mild mediastinal lymphadenopathy likely reactive. 2. Small left pleural effusion.  Mild right upper lobe atelectasis. 3.  Aortic Atherosclerosis (ICD10-I70.0). Electronically Signed   By: Elige KoHetal  Patel   On: 07/31/2017  12:47   Dg Chest Port 1 View  Result Date: 07/31/2017 CLINICAL DATA:  Intubation and central line placement. EXAM: PORTABLE CHEST 1 VIEW COMPARISON:  Chest x-ray from same day at 0529. FINDINGS: Interval placement of an endotracheal tube with the tip approximately 1 cm above the level of the carina. Enteric tube seen entering the stomach. Left subclavian central venous catheter with the tip at the cavoatrial junction. Stable cardiomegaly. Unchanged complete opacification of the left hemithorax due to large pleural effusion and left lung collapse. Increasing airspace disease within the right upper lobe. No acute osseous abnormality. IMPRESSION: 1. Interval placement of an endotracheal tube with the tip approximately 1 cm above the level of the carina. Recommend retraction 2-3 cm. 2. Appropriately positioned left subclavian central venous catheter. 3. Unchanged large left pleural effusion and left lung collapse. 4. Worsening airspace disease in the right upper lobe which could reflect edema or infection. Electronically Signed   By: Obie DredgeWilliam T Derry M.D.   On: 07/31/2017 10:51   Dg Chest Port 1 View  Result Date: 07/31/2017 CLINICAL DATA:  Acute respiratory failure EXAM: PORTABLE CHEST 1 VIEW COMPARISON:  07/30/2017 FINDINGS: Dense consolidation of the left hemithorax unchanged due to effusion and collapse. Progression of right lower lobe atelectasis/infiltrate. Diffuse airspace disease on the right may represent edema. IMPRESSION: Persistent collapse of the left lung Progressive right lower lobe atelectasis/infiltrate and small right effusion. Electronically Signed   By: Marlan Palauharles  Clark M.D.   On: 07/31/2017 07:26   Dg Abd Portable 1v  Result Date: 07/31/2017 CLINICAL DATA:  NG tube placement. EXAM: PORTABLE ABDOMEN - 1 VIEW COMPARISON:  None. FINDINGS: Enteric tube in place with the tip in the distal gastric body. Nonobstructive bowel gas pattern. No acute osseous abnormality. IMPRESSION: Enteric tube  appropriately positioned with the tip in the distal gastric body. Electronically Signed   By: Obie DredgeWilliam T Derry M.D.   On: 07/31/2017 10:52   ASSESSMENT AND PLAN:  52 yo female admitted for chest pain, pericardial effusion and acute on chronic hypoxic respiratory failure secondary to possible LLL pneumonia and acute CHF exacerbation.   * Acute on chronic respiratory failure with hypoxia   Secondary to acute on chronic diastolic congestive heart failure along with pneumonia, atelectesis, effusion - seen by CXR with complete opacification of left hemithorax - seen by Dr Thelma Bargeaks - chest tube not needed - Transferred to SDU last night for worsening hypoxemia and complete opacification of left hemithorax on CXR. - - intubated today - s/p bronch - moderate mucoid secretions and no endobronchial tumors, masses or foreign bodies.  No specimens were obtained - vent mgmt per PCCM - they Might consider early trach due to very difficult airway  * acute on chronic diastolic congestive heart failure   IV Lasix, monitor intake and output,   Cardiology input appreciated  * pericardial effusion and pleural effusion   Likely secondary to CHF - no need of chest tube per Dr Thelma Bargeaks  * Hypotension with h/o HTN - HOLD all BP meds  * Active smoking   Counseled to quit smoking for 4 minutes and offered nicotine patch.      All the records are  reviewed and case discussed with Care Management/Social Worker. Management plans discussed with the patient, Dr Sung Amabile, family at bedside and they are in agreement.  CODE STATUS: Full Code  TOTAL TIME TAKING CARE OF THIS PATIENT: 15 minutes.   More than 50% of the time was spent in counseling/coordination of care: YES     Delfino Lovett M.D on 07/31/2017 at 7:02 PM  Between 7am to 6pm - Pager - [phone]  After 6pm go to [email or web address] - Social research officer, government  [facility]  Office  [phone]  CC: Primary care physician; Cathlean Marseilles, MD  Note: This dictation was prepared with Dragon dictation along with smaller phrase technology. Any transcriptional errors that result from this process are unintentional.

## 2017-07-31 NOTE — Procedures (Signed)
PROCEDURE NOTE: CVL PLACEMENT  INDICATION:    Monitoring of central venous pressures and/or administration of medications optimally administered in central vein  CONSENT:   Risks of procedure as well as the alternatives were explained to the patient or surrogate. Consent for procedure obtained. A time out was performed.   PROCEDURE  Sterile technique was used including antiseptics, cap, gloves, gown, hand hygiene, mask and full body sheet.  Skin prep: Chlorhexidine; local anesthetic administered  A triple lumen catheter was placed in the L Zena vein using the Seldinger technique.  Ultrasound was used for vessel identification and guidance.   EVALUATION:  Blood flow good  Complications: No apparent complications  Patient tolerated the procedure well.  Chest X-ray revealed good position and no pneumothorax   Kanetra Ho, MD PCCM service Mobile [phone]    

## 2017-08-01 ENCOUNTER — Inpatient Hospital Stay: Payer: Medicaid Other

## 2017-08-01 DIAGNOSIS — I313 Pericardial effusion (noninflammatory): Secondary | ICD-10-CM

## 2017-08-01 DIAGNOSIS — I3139 Other pericardial effusion (noninflammatory): Secondary | ICD-10-CM

## 2017-08-01 LAB — COMPREHENSIVE METABOLIC PANEL
ALK PHOS: 80 U/L (ref 38–126)
ALT: 23 U/L (ref 14–54)
AST: 18 U/L (ref 15–41)
Albumin: 2.7 g/dL — ABNORMAL LOW (ref 3.5–5.0)
Anion gap: 11 (ref 5–15)
BILIRUBIN TOTAL: 0.8 mg/dL (ref 0.3–1.2)
BUN: 22 mg/dL — AB (ref 6–20)
CO2: 33 mmol/L — ABNORMAL HIGH (ref 22–32)
CREATININE: 1 mg/dL (ref 0.44–1.00)
Calcium: 9.1 mg/dL (ref 8.9–10.3)
Chloride: 96 mmol/L — ABNORMAL LOW (ref 101–111)
GFR calc Af Amer: >60 mL/min (ref >60)
GLUCOSE: 148 mg/dL — AB (ref 65–99)
Potassium: 4.3 mmol/L (ref 3.5–5.1)
Sodium: 140 mmol/L (ref 135–145)
TOTAL PROTEIN: 6.9 g/dL (ref 6.5–8.1)

## 2017-08-01 LAB — GLUCOSE, CAPILLARY
GLUCOSE-CAPILLARY: 118 mg/dL — AB (ref 65–99)
GLUCOSE-CAPILLARY: 136 mg/dL — AB (ref 65–99)
GLUCOSE-CAPILLARY: 156 mg/dL — AB (ref 65–99)
Glucose-Capillary: 144 mg/dL — ABNORMAL HIGH (ref 65–99)
Glucose-Capillary: 160 mg/dL — ABNORMAL HIGH (ref 65–99)
Glucose-Capillary: 160 mg/dL — ABNORMAL HIGH (ref 65–99)
Glucose-Capillary: 120 mg/dL — ABNORMAL HIGH (ref 65–99)

## 2017-08-01 LAB — CBC
HEMATOCRIT: 44.5 % (ref 35.0–47.0)
HEMOGLOBIN: 13.8 g/dL (ref 12.0–16.0)
MCH: 24.3 pg — ABNORMAL LOW (ref 26.0–34.0)
MCHC: 31.1 g/dL — ABNORMAL LOW (ref 32.0–36.0)
MCV: 78.1 fL — AB (ref 80.0–100.0)
Platelets: 392 10*3/uL (ref 150–440)
RBC: 5.69 MIL/uL — AB (ref 3.80–5.20)
RDW: 20.6 % — ABNORMAL HIGH (ref 11.5–14.5)
WBC: 13.1 10*3/uL — AB (ref 3.6–11.0)

## 2017-08-01 LAB — MAGNESIUM: Magnesium: 2.1 mg/dL (ref 1.7–2.4)

## 2017-08-01 LAB — PHOSPHORUS: Phosphorus: 4.5 mg/dL (ref 2.5–4.6)

## 2017-08-01 LAB — PROCALCITONIN

## 2017-08-01 MED ORDER — SODIUM CHLORIDE 0.9% FLUSH: 10.0000 mL | INTRAVENOUS | Status: DC | PRN

## 2017-08-01 MED ORDER — VITAL HIGH PROTEIN PO LIQD: 1000.0000 mL | ORAL | Status: DC

## 2017-08-01 MED ORDER — BUDESONIDE 0.25 MG/2ML IN SUSP
0.2500 mg | Freq: Four times a day (QID) | RESPIRATORY_TRACT | Status: DC
  Administered 2017-08-01 – 2017-08-04 (×12): 0.25 mg via RESPIRATORY_TRACT
  Filled 2017-08-01 (×12): qty 2

## 2017-08-01 MED ORDER — ADULT MULTIVITAMIN LIQUID CH
15.0000 mL | Freq: Every day | ORAL | Status: DC
  Administered 2017-08-01 – 2017-08-13 (×12): 15 mL
  Filled 2017-08-01 (×14): qty 15

## 2017-08-01 MED ORDER — PRO-STAT SUGAR FREE PO LIQD: 30.0000 mL | Freq: Two times a day (BID) | ORAL | Status: DC

## 2017-08-01 MED ORDER — PRO-STAT SUGAR FREE PO LIQD
30.0000 mL | Freq: Every day | ORAL | Status: DC
  Administered 2017-08-01 – 2017-08-02 (×5): 30 mL

## 2017-08-01 MED ORDER — VITAL HIGH PROTEIN PO LIQD
1000.0000 mL | ORAL | Status: DC
  Administered 2017-08-01 – 2017-08-02 (×2): 1000 mL

## 2017-08-01 MED ORDER — FREE WATER
200.0000 mL | Freq: Three times a day (TID) | Status: DC
  Administered 2017-08-01 – 2017-08-03 (×7): 200 mL

## 2017-08-01 NOTE — Progress Notes (Signed)
  Patient ID: Angel MeekerSonya D [name], female   DOB: 25-May-1965, 52 y.o.   MRN: [id number]  HISTORY: She remains intubated and sedated.   Vitals:   08/01/17 0851 08/01/17 0900  BP:  117/71  Pulse:  (!) 105  Resp:  19  Temp:    SpO2: 94% 94%     EXAM:    Resp: Lungs are equal bilaterally with mechanical breath sounds present.  Heart:  Regular without murmurs Abd:  Abdomen is soft, non distended and non tender. No masses are palpable.  There is no rebound and no guarding.     ASSESSMENT: I have independently reviewed the patient's chest x-ray.  There has been significant clearing of the left lung.  I suspect that this would likely reflects atelectasis on the original films.  There does not appear to be a large amount of fluid to drain on the left side.   PLAN:   I do not believe that I would recommend any further evaluation of the left pleural effusion.  If I may be of any assistance please not hesitate to call.    Hulda Marinimothy Amory Zbikowski, MD

## 2017-08-01 NOTE — Progress Notes (Signed)
Name: Reinaldo MeekerSonya D Ewart MRN: [id number] DOB: 1965/06/19    ADMISSION DATE:  07/29/2017 CONSULTATION DATE: 07/30/2017  REFERRING MD : Dr. Sherryll BurgerShah   CHIEF COMPLAINT:   BRIEF PATIENT DESCRIPTION:  [name] presented to ED 03/17 and admitted to hospitalist service with hypoxemic respiratory failure, atypical chest pain, pericardial effusion (no tamponade by Echo).  Transferred to SDU 03/18 with worsening hypoxemia and complete opacification of left hemithorax on CXR.  Initially supported with BiPAP.  Intubated AM 03/19.  Very difficult airway!!!  MAJOR EVENTS/TEST RESULTS: 03/17 admission as documented above 03/17 CTA chest: No PE.  Moderate pericardial effusion and pericardial enhancement suggesting acute pericarditis.  Left greater than right pleural effusions with LLL atelectasis versus infiltrate. 03/17 TTE: LVEF 85%.  Grade 1 diastolic dysfunction.  No evidence of cardiac tamponade 03/18 repeat TTE: LVEF 75%.  Grade 1 diastolic dysfunction.  Small anterior and posterior pericardial effusion.  Moderate left pleural effusion.  No evidence of tamponade.  Mildly dilated RA, moderately dilated RV 03/19 CT chest: Extensive consolidation of left lung with minimal aeration of LUL.  Moderate left pleural effusion.  Patchy airspace disease in right lung. 03/20 much improved aeration of left lung on chest x-ray.  Cuff leak test negative.  Patient remains on PEEP 10 cm H2O and FiO2 60%. 03/20 ENT consultation requested for possible early trach versus high risk extubation in OR when meets criteria  INDWELLING DEVICES:: ETT 03/19 >>  L [place] CVL 03/19 >>   MICRO DATA: MRSA PCR 03/19 >> NEG Urine 03/17 >> multiple species present suggesting contaminated specimen Resp 03/19 >>   ANTIMICROBIALS:  Vanc 03/17 >> 03/17 Cefepime 03/17 >>     SUBJECTIVE:  Intubated, sedated.  Synchronous with ventilator  VITAL SIGNS: Temp:  [98.1 [name] (36.7 C)-98.9 [name] (37.2 C)] 98.2 [name] (36.8 C) (03/20 0730) Pulse Rate:   [92-108] 98 (03/20 1200) Resp:  [16-23] 17 (03/20 1200) BP: (94-122)/(60-76) 100/62 (03/20 1200) SpO2:  [88 %-95 %] 92 % (03/20 1200) FiO2 (%):  [60 %] 60 % (03/20 1118)   HEMODYNAMICS:    VENTILATOR SETTINGS: Vent Mode: PRVC FiO2 (%):  [60 %] 60 % Set Rate:  [20 bmp] 20 bmp Vt Set:  [40 mL-400 mL] 400 mL PEEP:  [5 cmH20-10 cmH20] 10 cmH20 Plateau Pressure:  [24 cmH20-28 cmH20] 28 cmH20  INTAKE / OUTPUT: I/O last 3 completed shifts: In: 2221 [I.V.:1821; IV Piggyback:400] Out: 2450 [Urine:2350; Emesis/NG output:100]  PHYSICAL EXAMINATION: General: Intubated, sedated, synchronous with ventilator.  Not following commands on WUA Neuro: Cranial nerves intact, no focal deficits HEENT: NCAT, sclerae white Cardiovascular: Distant heart sounds, no murmur or rub noted Lungs: No wheezes or other adventitious sounds noted anteriorly Abdomen: Markedly obese, soft, nontender, bowel sounds present Extremities: Minimal symmetric ankle and pretibial edema Skin: No lesions noted   LABS:  BMET Recent Labs  Lab 07/30/17 0459 07/31/17 0648 08/01/17 0444  NA 136 136 140  K 3.2* 3.6 4.3  CL 91* 93* 96*  CO2 30 32 33*  BUN 13 23* 22*  CREATININE 0.96 1.49* 1.00  GLUCOSE 146* 125* 148*    Electrolytes Recent Labs  Lab 07/30/17 0459 07/31/17 0648 08/01/17 0444  CALCIUM 8.7* 8.5* 9.1  MG  --   --  2.1  PHOS  --   --  4.5    CBC Recent Labs  Lab 07/29/17 2235 07/30/17 0459 08/01/17 0444  WBC 12.9* 11.6* 13.1*  HGB 15.9 15.8 13.8  HCT 49.1* 51.6* 44.5  PLT  376 382 392    Coag's Recent Labs  Lab 07/31/17 0701  APTT 37*  INR 1.21    Sepsis Markers Recent Labs  Lab 07/29/17 2001  07/30/17 0459 07/31/17 0648 08/01/17 0444  LATICACIDVEN 1.0  --   --   --   --   PROCALCITON  --    < > 0.20 0.13 <0.10   < > = values in this interval not displayed.    ABG Recent Labs  Lab 07/31/17 1146  PHART 7.25*  PCO2ART 85*  PO2ART 91    Liver Enzymes Recent Labs   Lab 07/29/17 2235 08/01/17 0444  AST 38 18  ALT 36 23  ALKPHOS 100 80  BILITOT 2.1* 0.8  ALBUMIN 3.2* 2.7*    Cardiac Enzymes Recent Labs  Lab 07/29/17 1646  TROPONINI <0.03    Glucose Recent Labs  Lab 07/31/17 1507 07/31/17 1949 07/31/17 2357 08/01/17 0341 08/01/17 0806 08/01/17 1221  GLUCAP 168* 153* 154* 144* 160* 156*    CXR: Complete opacification of left hemithorax.  Vascular congestion suggested in right lung    ASSESSMENT / PLAN:  PULMONARY A: Acute hypoxemic respiratory failure Complete opacification of left hemithorax -much improved  Suspect was mostly atelectasis due to mucous plugging Smoker with suspected COPD Suspect OSA (based on daughter's history) Very difficult airway P:   Cont full vent support - settings reviewed and/or adjusted Cont vent bundle Daily SBT if/when meets criteria Continue nebulized steroids and bronchodilators Discontinue systemic steroids ENT consultation requested to consider early trach versus extubation in OR when the time comes  CARDIOVASCULAR A:  Pericardial effusion without evidence of tamponade Hemodynamically stable P:  Continue hemodynamic monitoring MAP goal >65 mmHg  RENAL A:   AKI, resolved P:   Monitor BMET intermittently Monitor I/Os Correct electrolytes as indicated    GASTROINTESTINAL A:   Severe obesity P:   SUP: IV famotidine Initiate TF protocol 03/20  HEMATOLOGIC A:   Polycythemia (suggests chronic hypoxemia) P:  DVT px: SQ heparin Monitor CBC intermittently Transfuse per usual guidelines   INFECTIOUS A:   Suspected pneumonia Possible acute pericarditis P:   Monitor temp, WBC count Micro and abx as above   ENDOCRINE A:   Mild hyperglycemia without documented history of diabetes Steroid-induced hyperglycemia P:   Moderate scale SSI  NEUROLOGIC A:   ICU/ventilator associated discomfort P:   RASS goal: -2, -3 Continue PAD protocol - propofol, intermittent  fentanyl   FAMILY  - Updates: Daughter updated in detail at bedside.  We again discussed my concerns regarding her airway status and the risk that this presents to her at the time of extubation.  She understands that I have requested ENT input and also understands that decision for a tracheostomy tube might be made.  CCM time: 40 mins The above time includes time spent in consultation with patient and/or family members and reviewing care plan on multidisciplinary rounds  Billy Fischer, MD PCCM service Mobile [phone] Pager [phone] 08/01/2017, 2:02 PM

## 2017-08-01 NOTE — Consult Note (Signed)
[name], [name] [id number] 1965-06-20 [name], MD  Reason for Consult: Respiratory Failure Requesting Physician: Fritzi Mandes, MD, [place] Consulting Physician: [name], MD  HPI: This 52 y.o. year old female was admitted on 07/29/2017 for Pericardial effusion [I31.3] Acute on chronic respiratory failure ([place]) [J96.20] Acute on chronic respiratory failure with hypoxia ([place]) [J96.21]. Presented to ER with hypoxemia, respiratory failure and atypical chest pain with pericardial effusion. Decompensated requiring intubation 3/19 and was a very difficult airway. No PE, but may have acute pericarditis and chest CT 3/19 showed extensive  consolidation of left lung with minimal aeration of LUL.Moderate left pleural effusion.  Patchy airspace disease in right lung. Seemed to be better on 3/20 CXR.     Medications:  Current Facility-Administered Medications  Medication Dose Route Frequency Provider Last Rate Last Dose  . albuterol (PROVENTIL) (2.5 MG/3ML) 0.083% nebulizer solution 2.5 mg  2.5 mg Inhalation Q3H PRN Wilhelmina Mcardle, MD      . bisacodyl (DULCOLAX) suppository 10 mg  10 mg Rectal Daily PRN Wilhelmina Mcardle, MD      . budesonide (PULMICORT) nebulizer solution 0.25 mg  0.25 mg Nebulization Q6H Wilhelmina Mcardle, MD   0.25 mg at 08/01/17 1311  . ceFEPIme (MAXIPIME) 2 g in sodium chloride 0.9 % 100 mL IVPB  2 g Intravenous Q8H Wilhelmina Mcardle, MD   Stopped at 08/01/17 1624  . chlorhexidine gluconate (MEDLINE KIT) (PERIDEX) 0.12 % solution 15 mL  15 mL Mouth Rinse BID Flora Lipps, MD   15 mL at 08/01/17 0810  . docusate (COLACE) 50 MG/5ML liquid 100 mg  100 mg Per Tube BID PRN Wilhelmina Mcardle, MD      . famotidine (PEPCID) IVPB 20 mg premix  20 mg Intravenous Q12H Wilhelmina Mcardle, MD   Stopped at 08/01/17 1030  . feeding supplement (PRO-STAT SUGAR FREE 64) liquid 30 mL  30 mL Per Tube 5 X Daily Wilhelmina Mcardle, MD   30 mL at 08/01/17 1326  . feeding supplement (VITAL HIGH  PROTEIN) liquid 1,000 mL  1,000 mL Per Tube Q24H Wilhelmina Mcardle, MD   1,000 mL at 08/01/17 1326  . fentaNYL (SUBLIMAZE) injection 100 mcg  100 mcg Intravenous Q2H PRN Wilhelmina Mcardle, MD   100 mcg at 08/01/17 1126  . free water 200 mL  200 mL Per Tube Q8H Wilhelmina Mcardle, MD   200 mL at 08/01/17 1413  . heparin injection 5,000 Units  5,000 Units Subcutaneous Q8H Vaughan Basta, MD   5,000 Units at 08/01/17 1325  . insulin aspart (novoLOG) injection 0-15 Units  0-15 Units Subcutaneous Q4H Wilhelmina Mcardle, MD   3 Units at 08/01/17 1325  . ipratropium-albuterol (DUONEB) 0.5-2.5 (3) MG/3ML nebulizer solution 3 mL  3 mL Nebulization Q6H Awilda Bill, NP   3 mL at 08/01/17 1311  . lactated ringers infusion   Intravenous Continuous Wilhelmina Mcardle, MD 75 mL/hr at 08/01/17 1200    . MEDLINE mouth rinse  15 mL Mouth Rinse 10 times per day Flora Lipps, MD   15 mL at 08/01/17 1521  . multivitamin liquid 15 mL  15 mL Per Tube Daily Wilhelmina Mcardle, MD   15 mL at 08/01/17 1326  . propofol (DIPRIVAN) 1000 MG/100ML infusion  0-50 mcg/kg/min Intravenous Continuous Wilhelmina Mcardle, MD 35.6 mL/hr at 08/01/17 1634 50 mcg/kg/min at 08/01/17 1634  . sodium chloride flush (NS) 0.9 % injection 10-40 mL  10-40 mL  Intracatheter PRN Fritzi Mandes, MD      .  Medications Prior to Admission  Medication Sig Dispense Refill  . albuterol (PROVENTIL HFA;VENTOLIN HFA) 108 (90 Base) MCG/ACT inhaler Inhale 2 puffs into the lungs every 6 (six) hours as needed for wheezing or shortness of breath. 1 Inhaler 2  . amitriptyline (ELAVIL) 10 MG tablet Take 10 mg by mouth at bedtime as needed.     Marland Kitchen amLODipine (NORVASC) 5 MG tablet Take 5 mg by mouth daily.    Marland Kitchen aspirin EC 81 MG tablet Take 81 mg by mouth daily.    Marland Kitchen buPROPion (WELLBUTRIN SR) 100 MG 12 hr tablet Take 100 mg by mouth 2 (two) times daily.    . clonazePAM (KLONOPIN) 0.5 MG tablet Take 0.5 mg by mouth 3 (three) times daily as needed.     . cyclobenzaprine  (FLEXERIL) 10 MG tablet Take 10 mg by mouth 3 (three) times daily as needed.    . furosemide (LASIX) 20 MG tablet Take 1 tablet (20 mg total) by mouth 2 (two) times daily. (Patient taking differently: Take 20 mg by mouth daily. ) 60 tablet 0  . Mometasone Furoate 200 MCG/ACT AERO Inhale 2 puffs into the lungs.    . naproxen sodium (ALEVE) 220 MG tablet Take 220 mg by mouth daily as needed.    . nitroGLYCERIN (NITROSTAT) 0.4 MG SL tablet Place 0.4 mg under the tongue as needed.    . senna-docusate (SENOKOT-S) 8.6-50 MG tablet Take 1 tablet by mouth at bedtime as needed for mild constipation.      Allergies: No Known Allergies  PMH:  Past Medical History:  Diagnosis Date  . Anxiety   . COPD (chronic obstructive pulmonary disease) ([place])   . Essential hypertension   . History of echocardiogram    a. 08/2013 Echo: EF 55-60%, impaired LV relaxation, mild LVH, nl RV fxn, nl RVSP, mild TR; b. 05/2017 Echo: EF 65-70%, no rwma, nl RV fxn.  . Morbid obesity ([place])   . Tobacco abuse     Fam Hx:  Family History  Problem Relation Age of Onset  . Dementia Mother   . Heart attack Father     Soc Hx:  Social History   Socioeconomic History  . Marital status: Single    Spouse name: Not on file  . Number of children: Not on file  . Years of education: Not on file  . Highest education level: Not on file  Social Needs  . Financial resource strain: Not on file  . Food insecurity - worry: Not on file  . Food insecurity - inability: Not on file  . Transportation needs - medical: Not on file  . Transportation needs - non-medical: Not on file  Occupational History  . Not on file  Tobacco Use  . Smoking status: Current Every Day Smoker    Packs/day: 0.75    Years: 40.00    Pack years: 30.00    Types: Cigarettes  . Smokeless tobacco: Never Used  Substance and Sexual Activity  . Alcohol use: No  . Drug use: No  . Sexual activity: Not on file  Other Topics Concern  . Not on file  Social  History Narrative   Lives in [place] with her mother, who has dementia.  She takes care of her mother and otw does not work.  She does not routinely exercise.    PSH:  Past Surgical History:  Procedure Laterality Date  . ABDOMINAL HYSTERECTOMY    .  CHOLECYSTECTOMY    . HERNIA REPAIR    . Procedures since admission: No admission procedures for hospital encounter.  ROS: Review of systems normal other than 12 systems except per HPI.  PHYSICAL EXAM  Vitals: Blood pressure (!) 106/59, pulse (!) 104, temperature 98.4 F (36.9 C), temperature source Axillary, resp. rate 20, height 5' (1.524 m), weight 261 lb 7.5 oz (118.6 kg), SpO2 95 %.. General: Well-developed, Well-nourished morbidly obese female, intubated and sedated Mood: Intubated and sedated. Cannot assess Orientation: Intubated and sedated. Cannot assess Vocal Quality: Intubated and sedated. Cannot assess head and Face: NCAT. No facial asymmetry. No visible skin lesions. No significant facial scars. No tenderness with sinus percussion. Facial strength normal and symmetric. Ears: External ears with normal landmarks, no lesions. External auditory canals free of infection, cerumen impaction or lesions. Tympanic membranes intact with good landmarks and normal mobility on pneumatic otoscopy. No middle ear effusion. Hearing: Speech reception grossly normal. Nose: External nose normal with midline dorsum and no lesions or deformity. Nasal Cavity reveals essentially midline septum with normal inferior turbinates. No infection seen. Has NG tube left nasal cavity Oral Cavity/ Oropharynx: Lips are normal with no lesions. Exam otherwise quite limited due to presence of ETT, but visualized areas unremarkable Indirect Laryngoscopy/Nasopharyngoscopy: Visualization of the larynx, hypopharynx and nasopharynx is not possible in this setting with routine examination. Neck: Supple and symmetric with no palpable masses, tenderness or crepitance. The  trachea is midline. Thyroid gland is soft, nontender and symmetric with no masses or enlargement. Parotid and submandibular glands are soft, nontender and symmetric, without masses. Lymphatic: Cervical lymph nodes are without palpable lymphadenopathy or tenderness. Respiratory: Intubated and sedated, on vent support Cardiovascular: Carotid pulse shows regular rate and rhythm Neurologic: Intubated and sedated. Cannot assess Eyes: Intubated and sedated. Cannot assess  MEDICAL DECISION MAKING: Data Review:  Results for orders placed or performed during the hospital encounter of 07/29/17 (from the past 48 hour(s))  Glucose, capillary     Status: Abnormal   Collection Time: 07/31/17  1:19 AM  Result Value Ref Range   Glucose-Capillary 159 (H) 65 - 99 mg/dL  MRSA PCR Screening     Status: None   Collection Time: 07/31/17  1:29 AM  Result Value Ref Range   MRSA by PCR NEGATIVE NEGATIVE    Comment:        The GeneXpert MRSA Assay (FDA approved for NASAL specimens only), is one component of a comprehensive MRSA colonization surveillance program. It is not intended to diagnose MRSA infection nor to guide or monitor treatment for MRSA infections. Performed at [facility], [address] [place], [place] [address]   Procalcitonin     Status: None   Collection Time: 07/31/17  6:48 AM  Result Value Ref Range   Procalcitonin 0.13 ng/mL    Comment:        Interpretation: PCT (Procalcitonin) <= 0.5 ng/mL: Systemic infection (sepsis) is not likely. Local bacterial infection is possible. (NOTE)       Sepsis PCT Algorithm           Lower Respiratory Tract                                      Infection PCT Algorithm    ----------------------------     ----------------------------         PCT < 0.25 ng/mL  PCT < 0.10 ng/mL         Strongly encourage             Strongly discourage   discontinuation of antibiotics    initiation of antibiotics     ----------------------------     -----------------------------       PCT 0.25 - 0.50 ng/mL            PCT 0.10 - 0.25 ng/mL               OR       >80% decrease in PCT            Discourage initiation of                                            antibiotics      Encourage discontinuation           of antibiotics    ----------------------------     -----------------------------         PCT >= 0.50 ng/mL              PCT 0.26 - 0.50 ng/mL               AND        <80% decrease in PCT             Encourage initiation of                                             antibiotics       Encourage continuation           of antibiotics    ----------------------------     -----------------------------        PCT >= 0.50 ng/mL                  PCT > 0.50 ng/mL               AND         increase in PCT                  Strongly encourage                                      initiation of antibiotics    Strongly encourage escalation           of antibiotics                                     -----------------------------                                           PCT <= 0.25 ng/mL                                                 OR                                        >  80% decrease in PCT                                     Discontinue / Do not initiate                                             antibiotics Performed at [facility], [address] [place], [place] [address]   Brain natriuretic peptide     Status: None   Collection Time: 07/31/17  6:48 AM  Result Value Ref Range   B Natriuretic Peptide 53.0 0.0 - 100.0 pg/mL    Comment: Performed at [facility], [address] [place], [place] [address]  Basic metabolic panel     Status: Abnormal   Collection Time: 07/31/17  6:48 AM  Result Value Ref Range   Sodium 136 135 - 145 mmol/L   Potassium 3.6 3.5 - 5.1 mmol/L   Chloride 93 (L) 101 - 111 mmol/L   CO2 32 22 - 32 mmol/L   Glucose, Bld 125 (H) 65 - 99 mg/dL    BUN 23 (H) 6 - 20 mg/dL   Creatinine, Ser 1.49 (H) 0.44 - 1.00 mg/dL   Calcium 8.5 (L) 8.9 - 10.3 mg/dL   GFR calc non Af Amer 39 (L) >60 mL/min   GFR calc Af Amer 45 (L) >60 mL/min    Comment: (NOTE) The eGFR has been calculated using the CKD EPI equation. This calculation has not been validated in all clinical situations. eGFR's persistently <60 mL/min signify possible Chronic Kidney Disease.    Anion gap 11 5 - 15    Comment: Performed at [facility], [address] [place], [place] [address]  Protime-INR     Status: None   Collection Time: 07/31/17  7:01 AM  Result Value Ref Range   Prothrombin Time 15.2 11.4 - 15.2 seconds   INR 1.21     Comment: Performed at [facility], [address] [place], [place] [address]  APTT     Status: Abnormal   Collection Time: 07/31/17  7:01 AM  Result Value Ref Range   aPTT 37 (H) 24 - 36 seconds    Comment:        IF BASELINE aPTT IS ELEVATED, SUGGEST PATIENT RISK ASSESSMENT BE USED TO DETERMINE APPROPRIATE ANTICOAGULANT THERAPY. Performed at [facility], [address] [place], [place] [address]   Glucose, capillary     Status: Abnormal   Collection Time: 07/31/17 11:41 AM  Result Value Ref Range   Glucose-Capillary 219 (H) 65 - 99 mg/dL   Comment 1 Document in Chart   Draw ABG 1 hour after initiation of ventilator     Status: Abnormal   Collection Time: 07/31/17 11:46 AM  Result Value Ref Range   FIO2 100.00    Delivery systems VENTILATOR    Mode PRESSURE REGULATED VOLUME CONTROL    VT 400 mL   LHR 20 resp/min   Peep/cpap 5.0 cm H20   pH, Arterial 7.25 (L) 7.350 - 7.450   pCO2 arterial 85 (HH) 32.0 - 48.0 mmHg    Comment: RBV SIMONDS MD AT 1153 BY N ELINSKI RRT ON [id number]    pO2, Arterial 91 83.0 - 108.0 mmHg   Bicarbonate 37.3 (H) 20.0 - 28.0  mmol/L   Acid-Base Excess 6.2 (H) 0.0 - 2.0 mmol/L   O2 Saturation 95.6 %   Patient temperature 37.0    Collection site LEFT RADIAL    Sample  type ARTERIAL DRAW    Allens test (pass/fail) PASS PASS    Comment: Performed at [facility], [address] [place], [place] [address]  Triglycerides     Status: None   Collection Time: 07/31/17 12:40 PM  Result Value Ref Range   Triglycerides 121 <150 mg/dL    Comment: Performed at [facility], [address] [place], [place] [address]  Culture, respiratory (NON-Expectorated)     Status: None (Preliminary result)   Collection Time: 07/31/17  2:50 PM  Result Value Ref Range   Specimen Description      TRACHEAL ASPIRATE Performed at [facility], [address] [place], [place] [address]    Special Requests      NONE Performed at [facility], [place], [place] [address]    Gram Stain      RARE WBC PRESENT, PREDOMINANTLY PMN RARE GRAM POSITIVE COCCI RARE GRAM POSITIVE RODS    Culture      CULTURE REINCUBATED FOR BETTER GROWTH Performed at [facility], [place] [address] [place], [place] [address]    Report Status PENDING   Glucose, capillary     Status: Abnormal   Collection Time: 07/31/17  3:07 PM  Result Value Ref Range   Glucose-Capillary 168 (H) 65 - 99 mg/dL   Comment 1 Document in Chart   Glucose, capillary     Status: Abnormal   Collection Time: 07/31/17  7:49 PM  Result Value Ref Range   Glucose-Capillary 153 (H) 65 - 99 mg/dL  Glucose, capillary     Status: Abnormal   Collection Time: 07/31/17 11:57 PM  Result Value Ref Range   Glucose-Capillary 154 (H) 65 - 99 mg/dL  Glucose, capillary     Status: Abnormal   Collection Time: 08/01/17  3:41 AM  Result Value Ref Range   Glucose-Capillary 144 (H) 65 - 99 mg/dL  CBC     Status: Abnormal   Collection Time: 08/01/17  4:44 AM  Result Value Ref Range   WBC 13.1 (H) 3.6 - 11.0 K/uL   RBC 5.69 (H) 3.80 - 5.20 MIL/uL   Hemoglobin 13.8 12.0 - 16.0 g/dL   HCT 44.5 35.0 - 47.0 %   MCV 78.1 (L) 80.0 - 100.0 fL   MCH 24.3 (L) 26.0 - 34.0 pg   MCHC 31.1  (L) 32.0 - 36.0 g/dL   RDW 20.6 (H) 11.5 - 14.5 %   Platelets 392 150 - 440 K/uL    Comment: Performed at [facility], [address] [place],  [address]  Comprehensive metabolic panel     Status: Abnormal   Collection Time: 08/01/17  4:44 AM  Result Value Ref Range   Sodium 140 135 - 145 mmol/L   Potassium 4.3 3.5 - 5.1 mmol/L   Chloride 96 (L) 101 - 111 mmol/L   CO2 33 (H) 22 - 32 mmol/L   Glucose, Bld 148 (H) 65 - 99 mg/dL   BUN 22 (H) 6 - 20 mg/dL   Creatinine, Ser 1.00 0.44 - 1.00 mg/dL   Calcium 9.1 8.9 - 10.3 mg/dL   Total Protein 6.9 6.5 - 8.1 g/dL   Albumin 2.7 (L) 3.5 - 5.0 g/dL   AST 18 15 - 41 U/L   ALT  23 14 - 54 U/L   Alkaline Phosphatase 80 38 - 126 U/L   Total Bilirubin 0.8 0.3 - 1.2 mg/dL   GFR calc non Af Amer >60 >60 mL/min   GFR calc Af Amer >60 >60 mL/min    Comment: (NOTE) The eGFR has been calculated using the CKD EPI equation. This calculation has not been validated in all clinical situations. eGFR's persistently <60 mL/min signify possible Chronic Kidney Disease.    Anion gap 11 5 - 15    Comment: Performed at [facility], [place], [place] [address]  Procalcitonin     Status: None   Collection Time: 08/01/17  4:44 AM  Result Value Ref Range   Procalcitonin <0.10 ng/mL    Comment:        Interpretation: PCT (Procalcitonin) <= 0.5 ng/mL: Systemic infection (sepsis) is not likely. Local bacterial infection is possible. (NOTE)       Sepsis PCT Algorithm           Lower Respiratory Tract                                      Infection PCT Algorithm    ----------------------------     ----------------------------         PCT < 0.25 ng/mL                PCT < 0.10 ng/mL         Strongly encourage             Strongly discourage   discontinuation of antibiotics    initiation of antibiotics    ----------------------------     -----------------------------       PCT 0.25 - 0.50 ng/mL            PCT 0.10 - 0.25  ng/mL               OR       >80% decrease in PCT            Discourage initiation of                                            antibiotics      Encourage discontinuation           of antibiotics    ----------------------------     -----------------------------         PCT >= 0.50 ng/mL              PCT 0.26 - 0.50 ng/mL               AND        <80% decrease in PCT             Encourage initiation of                                             antibiotics       Encourage continuation           of antibiotics    ----------------------------     -----------------------------        PCT >= 0.50 ng/mL  PCT > 0.50 ng/mL               AND         increase in PCT                  Strongly encourage                                      initiation of antibiotics    Strongly encourage escalation           of antibiotics                                     -----------------------------                                           PCT <= 0.25 ng/mL                                                 OR                                        > 80% decrease in PCT                                     Discontinue / Do not initiate                                             antibiotics Performed at [facility], [address], [place], [place] [address]   Magnesium     Status: None   Collection Time: 08/01/17  4:44 AM  Result Value Ref Range   Magnesium 2.1 1.7 - 2.4 mg/dL    Comment: Performed at [facility], [address] [place], [place] [address]  Phosphorus     Status: None   Collection Time: 08/01/17  4:44 AM  Result Value Ref Range   Phosphorus 4.5 2.5 - 4.6 mg/dL    Comment: Performed at [facility], [address] [place], [place] [address]  Glucose, capillary     Status: Abnormal   Collection Time: 08/01/17  8:06 AM  Result Value Ref Range   Glucose-Capillary 160 (H) 65 - 99 mg/dL   Comment 1 Notify RN   Glucose, capillary      Status: Abnormal   Collection Time: 08/01/17 12:21 PM  Result Value Ref Range   Glucose-Capillary 156 (H) 65 - 99 mg/dL   Comment 1 Notify RN   Glucose, capillary     Status: Abnormal   Collection Time: 08/01/17  4:22 PM  Result Value Ref Range   Glucose-Capillary 160 (H) 65 - 99 mg/dL   Comment 1 Notify RN   . Ct Chest Wo Contrast  Result Date: 07/31/2017 CLINICAL DATA:  She  presented to [facility] ER on 03/17 with c/o intermittent chest pain worse during inspiration and nonproductive cough onset 1 week prior to presentation. On 03/18 pt developed hypotension and worsening acute hypoxic respiratory failure. EXAM: CT CHEST WITHOUT CONTRAST TECHNIQUE: Multidetector CT imaging of the chest was performed following the standard protocol without IV contrast. COMPARISON:  07/29/2017, 06/10/2017 FINDINGS: Cardiovascular: Stable cardiomegaly. Small pericardial effusion. Dilatation of main pulmonary artery as well as the right and left main pulmonary arteries as can be seen with pulmonary arterial hypertension. Thoracic aorta is normal in caliber. Mediastinum/Nodes: Enlarged prevascular lymph node measuring 17 mm in short axis. Multiple other smaller mediastinal lymph nodes are noted. Thyroid gland, trachea, and esophagus demonstrate no significant findings. Lungs/Pleura: Trace right pleural effusion. Small left pleural effusion. Left lower lobe airspace disease with air bronchograms concerning for pneumonia. Improved aeration of left upper lobe. Mild persistent right upper lobe airspace disease likely reflecting atelectasis. Stable 6 mm right middle lobe pulmonary nodule unchanged compared with 09/01/2013. Upper Abdomen: Aortic atherosclerosis. Musculoskeletal: No acute osseous abnormality. No aggressive osseous lesion. IMPRESSION: 1. Left lower lobe airspace disease with air bronchograms concerning for combination of pneumonia and atelectasis. Mild mediastinal lymphadenopathy likely reactive. 2. Small left pleural  effusion.  Mild right upper lobe atelectasis. 3.  Aortic Atherosclerosis (ICD10-I70.0). Electronically Signed   By: Kathreen Devoid   On: 07/31/2017 12:47   Dg Chest Port 1 View  Result Date: 08/01/2017 CLINICAL DATA:  Respiratory failure EXAM: PORTABLE CHEST 1 VIEW COMPARISON:  Chest radiograph and chest CT July 31, 2017 FINDINGS: Endotracheal tube tip is 3.7 cm above the carina. Central catheter tip is in the superior vena cava. No pneumothorax. There has been significant partial clearing on the left compared to 1 day prior. There has been the significant clearing of diffuse consolidation and effusion. There remains atelectatic change in the left base as well as small left effusion. There has been a clearing of consolidation from the right upper lobe. Currently the right lung is clear except for atelectasis in the left base. There is cardiomegaly with pulmonary venous hypertension. No adenopathy evident. No bone lesions. IMPRESSION: Tube and catheter positions as described without pneumothorax. Significant clearing at the throughout the left lung and right upper lobe. There remains atelectatic change in each lower lung zone as well as underlying pulmonary vascular congestion. There is felt to be small left residual pleural effusion. Electronically Signed   By: Lowella Grip III M.D.   On: 08/01/2017 07:56   Dg Chest Port 1 View  Result Date: 07/31/2017 CLINICAL DATA:  Intubation and central line placement. EXAM: PORTABLE CHEST 1 VIEW COMPARISON:  Chest x-ray from same day at 0529. FINDINGS: Interval placement of an endotracheal tube with the tip approximately 1 cm above the level of the carina. Enteric tube seen entering the stomach. Left subclavian central venous catheter with the tip at the cavoatrial junction. Stable cardiomegaly. Unchanged complete opacification of the left hemithorax due to large pleural effusion and left lung collapse. Increasing airspace disease within the right upper lobe. No  acute osseous abnormality. IMPRESSION: 1. Interval placement of an endotracheal tube with the tip approximately 1 cm above the level of the carina. Recommend retraction 2-3 cm. 2. Appropriately positioned left subclavian central venous catheter. 3. Unchanged large left pleural effusion and left lung collapse. 4. Worsening airspace disease in the right upper lobe which could reflect edema or infection. Electronically Signed   By: Titus Dubin M.D.   On: 07/31/2017 10:51  Dg Chest Port 1 View  Result Date: 07/31/2017 CLINICAL DATA:  Acute respiratory failure EXAM: PORTABLE CHEST 1 VIEW COMPARISON:  07/30/2017 FINDINGS: Dense consolidation of the left hemithorax unchanged due to effusion and collapse. Progression of right lower lobe atelectasis/infiltrate. Diffuse airspace disease on the right may represent edema. IMPRESSION: Persistent collapse of the left lung Progressive right lower lobe atelectasis/infiltrate and small right effusion. Electronically Signed   By: Franchot Gallo M.D.   On: 07/31/2017 07:26   Dg Abd Portable 1v  Result Date: 07/31/2017 CLINICAL DATA:  NG tube placement. EXAM: PORTABLE ABDOMEN - 1 VIEW COMPARISON:  None. FINDINGS: Enteric tube in place with the tip in the distal gastric body. Nonobstructive bowel gas pattern. No acute osseous abnormality. IMPRESSION: Enteric tube appropriately positioned with the tip in the distal gastric body. Electronically Signed   By: Titus Dubin M.D.   On: 07/31/2017 10:52  .     ASSESSMENT: Patient with acute respiratory failure now intubated for 3 days, and signs of improvement in CXR compared to CT the day prior  PLAN: This patient, while having a difficult airway for intubation, would also be a difficult tracheostomy due to obesity and at a little higher risk of trach complications. Given she is only 3 days out from intubation and showing improvement in the CXR, I would not think an early tracheostomy makes much sense. Any procedure,  whether a planned tracheostomy or a future decision to extubate the patient in the OR will require scheduled OR time, preferably with an OR assistant. This is certainly not the type of procedure we would recommend performing after hours or on the weekend.  At this point I would have to recommend continued medical management in hopes of avoiding a tracheostomy, but any planning for an OR extubation will require repeat consultation so that we can find a time to do this when one of our surgeons has OR time available, ideally making arrangements to have an assistant in case emergent tracheostomy is needed.    [name], MD 08/01/2017 5:23 PM

## 2017-08-01 NOTE — Progress Notes (Signed)
SUBJECTIVE: Pt is sedated and intubated, unresponsive to questions.    Vitals:   08/01/17 0730 08/01/17 0800 08/01/17 0851 08/01/17 0900  BP: 108/74 122/76  117/71  Pulse: 99 99  (!) 105  Resp: 20 (!) 21  19  Temp: 98.2 F (36.8 C)     TempSrc: Axillary     SpO2: 94% 93% 94% 94%  Weight:      Height:        Intake/Output Summary (Last 24 hours) at 08/01/2017 0927 Last data filed at 08/01/2017 0700 Gross per 24 hour  Intake 2221.02 ml  Output 2300 ml  Net -78.98 ml    LABS: Basic Metabolic Panel: Recent Labs    07/31/17 0648 08/01/17 0444  NA 136 140  K 3.6 4.3  CL 93* 96*  CO2 32 33*  GLUCOSE 125* 148*  BUN 23* 22*  CREATININE 1.49* 1.00  CALCIUM 8.5* 9.1  MG  --  2.1  PHOS  --  4.5   Liver Function Tests: Recent Labs    07/29/17 2235 08/01/17 0444  AST 38 18  ALT 36 23  ALKPHOS 100 80  BILITOT 2.1* 0.8  PROT 7.5 6.9  ALBUMIN 3.2* 2.7*   No results for input(s): LIPASE, AMYLASE in the last 72 hours. CBC: Recent Labs    07/29/17 1646  07/30/17 0459 08/01/17 0444  WBC 8.2   < > 11.6* 13.1*  NEUTROABS 6.7*  --   --   --   HGB 16.8*   < > 15.8 13.8  HCT 54.2*   < > 51.6* 44.5  MCV 77.7*   < > 78.5* 78.1*  PLT 393   < > 382 392   < > = values in this interval not displayed.   Cardiac Enzymes: Recent Labs    07/29/17 1646  TROPONINI <0.03   BNP: Invalid input(s): POCBNP D-Dimer: No results for input(s): DDIMER in the last 72 hours. Hemoglobin A1C: No results for input(s): HGBA1C in the last 72 hours. Fasting Lipid Panel: Recent Labs    07/31/17 1240  TRIG 121   Thyroid Function Tests: No results for input(s): TSH, T4TOTAL, T3FREE, THYROIDAB in the last 72 hours.  Invalid input(s): FREET3 Anemia Panel: No results for input(s): VITAMINB12, FOLATE, FERRITIN, TIBC, IRON, RETICCTPCT in the last 72 hours.   PHYSICAL EXAM General: Sedated, intubated HEENT:  Normocephalic and atramatic Neck:  No JVD.  Lungs: Wheezing  bilaterally Heart: HRRR . Normal S1 and S2 without gallops or murmurs.  Abdomen: Bowel sounds are positive, abdomen soft and non-tender  Msk:  Back normal, normal gait. Normal strength and tone for age. Extremities: No clubbing, cyanosis or edema.   Neuro: Sedated, intubated  TELEMETRY: NSR 102bpm  ASSESSMENT AND PLAN: Acute on chronic respiratory failure secondary to COPD, CHF with mild diastolic dysfunction, mild-moderate pericardial effusion without evidence of tamponade. Remains intubated and sedated. No changes at this time from cardiac perspective, continue respiratory support and will continue to monitor cardiac status.   Principal Problem:   Acute on chronic respiratory failure with hypoxia [facility](HCC) Active Problems:   Acute respiratory failure (HCC)    Caroleen HammanKristin Alphonso Gregson, NP-C 08/01/2017 9:27 AM Cell: [phone]

## 2017-08-01 NOTE — Progress Notes (Signed)
Initial Nutrition Assessment  DOCUMENTATION CODES:   Morbid obesity  INTERVENTION:  Initiate goal regimen of Vital High Protein at 20 mL/hr (480 mL goal daily volume) + Pro-Stat 30 mL 5 times daily via NGT. Provides 980 kcal, 117 grams of protein, 403 mL H2O daily. With current propofol rate provides 1920 kcal.  Provide liquid MVI daily per tube as goal TF regimen does not meet 100% RDIs for vitamins/minerals.  With free water flush of 200 mL Q8hrs patient is receiving a total of 1003 mL H2O daily including water in tube feeding. Patient is also receiving LR at 75 mL/hr but that is set to discontinue at 1159 tonight.  NUTRITION DIAGNOSIS:   Inadequate oral intake related to inability to eat as evidenced by NPO status.  GOAL:   Provide needs based on ASPEN/SCCM guidelines  MONITOR:   Vent status, Labs, Weight trends, TF tolerance, I & O's  REASON FOR ASSESSMENT:   Ventilator, Consult Enteral/tube feeding initiation and management  ASSESSMENT:   52 year old female with PMHx of COPD, tobacco abuse, anxiety, essential HTN who was admitted with atypical chest pain, hypoxemic respiratory failure, and pericardial effusion and then transferred to SDU on 3/18 with worsening hypoxemia and complete opacification of left hemithorax suspected to be due to atelectasis initially requiring BiPAP but was intubated on AM of 3/19.   -Patient was a very difficult intubation per discussion on rounds. Plan may be for early trach due to her difficult airway and likely severe OSA.  Patient intubated and sedated. Her daughter was present at bedside at time of RD assessment. Patient had a good appetite and intake PTA. She had been weight-stable with UBW of 265 lbs. Per chart patient has been 260-265 lbs.  Access: NGT placed 3/19; terminates in distal gastric body per abdominal x-ray 3/19; 60 cm at left nare  MAP: 73-89 mmHg; not on any pressors  Patient is currently intubated on ventilator  support MV: 8.3 L/min Temp (24hrs), Avg:98.5 F (36.9 C), Min:98.1 F (36.7 C), Max:98.9 F (37.2 C)  Propofol: 35.6 ml/hr (940 kcal daily)  Medications reviewed and include: free water flush 200 mL Q8hrs, Novolog 0-15 units Q4hrs (received 19 units past 24 hrs), cefepime, famotidine, LR @ 75 mL/hr (1800 mL fluid daily), propofol gtt.  Labs reviewed: CBG 114-219 past 24 hrs, Chloride 96, CO2 33, BUN 22. Potassium, Phosphorus, and Magnesium WNL today.  I/O: 2200 mL UOP yesterday (0.8 mL/kg/hr)  Patient does not meet criteria for malnutrition.  Discussed with RN and on rounds. Plan is to start tube feeds today.  NUTRITION - FOCUSED PHYSICAL EXAM:    Most Recent Value  Orbital Region  No depletion  Upper Arm Region  No depletion  Thoracic and Lumbar Region  No depletion  Buccal Region  Unable to assess  Temple Region  No depletion  Clavicle Bone Region  No depletion  Clavicle and Acromion Bone Region  No depletion  Scapular Bone Region  Unable to assess  Dorsal Hand  No depletion  Patellar Region  No depletion  Anterior Thigh Region  No depletion  Posterior Calf Region  No depletion  Edema (RD Assessment)  None  Hair  Reviewed  Eyes  Unable to assess  Mouth  Unable to assess  Skin  Reviewed  Nails  Reviewed     Diet Order:  No diet orders on file  EDUCATION NEEDS:   No education needs have been identified at this time  Skin:  Skin Assessment: Reviewed RN  Assessment  Last BM:  07/30/2017 - no BM characteristics documented  Height:   Ht Readings from Last 1 Encounters:  07/31/17 5' (1.524 m)    Weight:   Wt Readings from Last 1 Encounters:  07/31/17 261 lb 7.5 oz (118.6 kg)    Ideal Body Weight:  45.5 kg  BMI:  Body mass index is 51.06 kg/m.  Estimated Nutritional Needs:   Kcal:  1143-1334 kcal (60-70% of PSU 2003b w/ MSJ 1716, Ve 8.3, Tmax 37.2); 22-25 kcal/kg IBW is 1001-1138 kcal  Protein:  >/= 114 grams (>/= 2.5 grams/kg IBW  Fluid:  1.4-1.6  L/day (30-35 mL/kg IBW)  Angel RimaLeanne Angel Haser, MS, RD, LDN Office: [phone] Pager: [phone] After Hours/Weekend Pager: [phone]

## 2017-08-01 NOTE — Progress Notes (Signed)
[facility] [place] [facility]   PATIENT NAME: [name]    MR#:  [id number]  DATE OF BIRTH:  07/07/1965  SUBJECTIVE:   Intubated on the ventilator.  Patient on propofol for sedation.  FiO2 60% REVIEW OF SYSTEMS:   Review of Systems  Unable to perform ROS: Intubated   Tolerating Diet: Tube feedings  DRUG ALLERGIES:  No Known Allergies  VITALS:  Blood pressure 108/65, pulse 99, temperature 98.4 F (36.9 C), temperature source Axillary, resp. rate 18, height 5' (1.524 m), weight 118.6 kg (261 lb 7.5 oz), SpO2 94 %.  PHYSICAL EXAMINATION:   Physical Exam  GENERAL:  52 y.o.-year-old patient lying in the bed with no acute distress.  Morbidly obese EYES: Pupils equal, round, reactive to light and accommodation. No scleral icterus. Extraocular muscles intact.  HEENT: Head atraumatic, normocephalic. Oropharynx and nasopharynx clear.  Tube dated on the ventilator NECK:  Supple, no jugular venous distention. No thyroid enlargement, no tenderness.  LUNGS: Normal breath sounds bilaterally, no wheezing, rales, rhonchi. No use of accessory muscles of respiration.  CARDIOVASCULAR: S1, S2 normal. No murmurs, rubs, or gallops.  ABDOMEN: Soft, nontender, nondistended. Bowel sounds present. No organomegaly or mass.  EXTREMITIES: No cyanosis, clubbing or edema b/l.    NEUROLOGIC: Intubated.   PSYCHIATRIC: Sedated on the vent SKIN: No obvious rash, lesion, or ulcer.   LABORATORY PANEL:  CBC Recent Labs  Lab 08/01/17 0444  WBC 13.1*  HGB 13.8  HCT 44.5  PLT 392    Chemistries  Recent Labs  Lab 08/01/17 0444  NA 140  K 4.3  CL 96*  CO2 33*  GLUCOSE 148*  BUN 22*  CREATININE 1.00  CALCIUM 9.1  MG 2.1  AST 18  ALT 23  ALKPHOS 80  BILITOT 0.8   Cardiac Enzymes Recent Labs  Lab 07/29/17 1646  TROPONINI <0.03   RADIOLOGY:  Ct Chest Wo Contrast  Result Date: 07/31/2017 CLINICAL DATA:  [facility]17 with c/o  intermittent chest pain worse during inspiration and nonproductive cough onset 1 week prior to presentation. On 03/18 pt developed hypotension and worsening acute hypoxic respiratory failure. EXAM: CT CHEST WITHOUT CONTRAST TECHNIQUE: Multidetector CT imaging of the chest was performed following the standard protocol without IV contrast. COMPARISON:  07/29/2017, 06/10/2017 FINDINGS: Cardiovascular: Stable cardiomegaly. Small pericardial effusion. Dilatation of main pulmonary artery as well as the right and left main pulmonary arteries as can be seen with pulmonary arterial hypertension. Thoracic aorta is normal in caliber. Mediastinum/Nodes: Enlarged prevascular lymph node measuring 17 mm in short axis. Multiple other smaller mediastinal lymph nodes are noted. Thyroid gland, trachea, and esophagus demonstrate no significant findings. Lungs/Pleura: Trace right pleural effusion. Small left pleural effusion. Left lower lobe airspace disease with air bronchograms concerning for pneumonia. Improved aeration of left upper lobe. Mild persistent right upper lobe airspace disease likely reflecting atelectasis. Stable 6 mm right middle lobe pulmonary nodule unchanged compared with 09/01/2013. Upper Abdomen: Aortic atherosclerosis. Musculoskeletal: No acute osseous abnormality. No aggressive osseous lesion. IMPRESSION: 1. Left lower lobe airspace disease with air bronchograms concerning for combination of pneumonia and atelectasis. Mild mediastinal lymphadenopathy likely reactive. 2. Small left pleural effusion.  Mild right upper lobe atelectasis. 3.  Aortic Atherosclerosis (ICD10-I70.0). Electronically Signed   By: Elige KoHetal  Amarri Satterly   On: 07/31/2017 12:47   Dg Chest Port 1 View  Result Date: 08/01/2017 CLINICAL DATA:  Respiratory failure EXAM: PORTABLE CHEST 1 VIEW COMPARISON:  Chest radiograph  and chest CT July 31, 2017 FINDINGS: Endotracheal tube tip is 3.7 cm above the carina. Central catheter tip is in the superior vena  cava. No pneumothorax. There has been significant partial clearing on the left compared to 1 day prior. There has been the significant clearing of diffuse consolidation and effusion. There remains atelectatic change in the left base as well as small left effusion. There has been a clearing of consolidation from the right upper lobe. Currently the right lung is clear except for atelectasis in the left base. There is cardiomegaly with pulmonary venous hypertension. No adenopathy evident. No bone lesions. IMPRESSION: Tube and catheter positions as described without pneumothorax. Significant clearing at the throughout the left lung and right upper lobe. There remains atelectatic change in each lower lung zone as well as underlying pulmonary vascular congestion. There is felt to be small left residual pleural effusion. Electronically Signed   By: Bretta Bang III M.D.   On: 08/01/2017 07:56   Dg Chest Port 1 View  Result Date: 07/31/2017 CLINICAL DATA:  Intubation and central line placement. EXAM: PORTABLE CHEST 1 VIEW COMPARISON:  Chest x-ray from same day at 0529. FINDINGS: Interval placement of an endotracheal tube with the tip approximately 1 cm above the level of the carina. Enteric tube seen entering the stomach. Left subclavian central venous catheter with the tip at the cavoatrial junction. Stable cardiomegaly. Unchanged complete opacification of the left hemithorax due to large pleural effusion and left lung collapse. Increasing airspace disease within the right upper lobe. No acute osseous abnormality. IMPRESSION: 1. Interval placement of an endotracheal tube with the tip approximately 1 cm above the level of the carina. Recommend retraction 2-3 cm. 2. Appropriately positioned left subclavian central venous catheter. 3. Unchanged large left pleural effusion and left lung collapse. 4. Worsening airspace disease in the right upper lobe which could reflect edema or infection. Electronically Signed   By:  Obie Dredge M.D.   On: 07/31/2017 10:51   Dg Chest Port 1 View  Result Date: 07/31/2017 CLINICAL DATA:  Acute respiratory failure EXAM: PORTABLE CHEST 1 VIEW COMPARISON:  07/30/2017 FINDINGS: Dense consolidation of the left hemithorax unchanged due to effusion and collapse. Progression of right lower lobe atelectasis/infiltrate. Diffuse airspace disease on the right may represent edema. IMPRESSION: Persistent collapse of the left lung Progressive right lower lobe atelectasis/infiltrate and small right effusion. Electronically Signed   By: Marlan Palau M.D.   On: 07/31/2017 07:26   Dg Abd Portable 1v  Result Date: 07/31/2017 CLINICAL DATA:  NG tube placement. EXAM: PORTABLE ABDOMEN - 1 VIEW COMPARISON:  None. FINDINGS: Enteric tube in place with the tip in the distal gastric body. Nonobstructive bowel gas pattern. No acute osseous abnormality. IMPRESSION: Enteric tube appropriately positioned with the tip in the distal gastric body. Electronically Signed   By: Obie Dredge M.D.   On: 07/31/2017 10:52   ASSESSMENT AND PLAN:  52 yo female admitted for chest pain, pericardial effusion and acute on chronic hypoxic respiratory failure secondary to possible LLL pneumonia and acute CHF exacerbation.   * Acute on chronic respiratory failure with hypoxia   Secondary to acute on chronic diastolic congestive heart failure along with pneumonia, atelectesis, effusion - seen by CXR with complete opacification of left hemithorax - seen by Dr Thelma Barge - chest tube not needed - - s/p bronch - moderate mucoid secretions and no endobronchial tumors, masses or foreign bodies.  No specimens were obtained - vent mgmt per PCCM - they  Might consider early trach due to very difficult airway  * acute on chronic diastolic congestive heart failure   IV Lasix, monitor intake and output,   Cardiology input appreciated  * pericardial effusion and pleural effusion   Likely secondary to CHF - no need of chest tube  per Dr Thelma Barge  * Hypotension with h/o HTN - HOLD all BP meds  Case discussed with Care Management/Social Worker. Management plans discussed with the patient, family and they are in agreement.  CODE STATUS: FUll  DVT Prophylaxis: lovenox  TOTAL TIME TAKING CARE OF THIS PATIENT: 25 minutes.  >50% time spent on counselling and coordination of care  POSSIBLE D/C IN ? DAYS, DEPENDING ON CLINICAL CONDITION.  Note: This dictation was prepared with Dragon dictation along with smaller phrase technology. Any transcriptional errors that result from this process are unintentional.  Enedina Finner M.D on 08/01/2017 at 3:43 PM  Between 7am to 6pm - Pager - [phone]  After 6pm go to [email or web address] - Social research officer, government  [facility]  Office  [phone]  CC: Primary care physician; Cathlean Marseilles, MDPatient ID: [name], female   DOB: 04-Jun-1965, 52 y.o.   MRN: [id number]

## 2017-08-02 ENCOUNTER — Inpatient Hospital Stay: Payer: Medicaid Other

## 2017-08-02 LAB — BASIC METABOLIC PANEL
Anion gap: 8 (ref 5–15)
BUN: 26 mg/dL — ABNORMAL HIGH (ref 6–20)
CHLORIDE: 100 mmol/L — AB (ref 101–111)
CO2: 33 mmol/L — ABNORMAL HIGH (ref 22–32)
Calcium: 9.1 mg/dL (ref 8.9–10.3)
Creatinine, Ser: 0.95 mg/dL (ref 0.44–1.00)
GFR calc Af Amer: >60 mL/min (ref >60)
Glucose, Bld: 135 mg/dL — ABNORMAL HIGH (ref 65–99)
POTASSIUM: 4.3 mmol/L (ref 3.5–5.1)
SODIUM: 141 mmol/L (ref 135–145)

## 2017-08-02 LAB — CBC
HEMATOCRIT: 44.3 % (ref 35.0–47.0)
Hemoglobin: 13.4 g/dL (ref 12.0–16.0)
MCH: 24.1 pg — ABNORMAL LOW (ref 26.0–34.0)
MCHC: 30.3 g/dL — ABNORMAL LOW (ref 32.0–36.0)
MCV: 79.4 fL — AB (ref 80.0–100.0)
Platelets: 350 10*3/uL (ref 150–440)
RBC: 5.58 MIL/uL — AB (ref 3.80–5.20)
RDW: 21.1 % — ABNORMAL HIGH (ref 11.5–14.5)
WBC: 10.7 10*3/uL (ref 3.6–11.0)

## 2017-08-02 LAB — GLUCOSE, CAPILLARY
GLUCOSE-CAPILLARY: 125 mg/dL — AB (ref 65–99)
GLUCOSE-CAPILLARY: 128 mg/dL — AB (ref 65–99)
GLUCOSE-CAPILLARY: 114 mg/dL — AB (ref 65–99)
GLUCOSE-CAPILLARY: 119 mg/dL — AB (ref 65–99)
Glucose-Capillary: 116 mg/dL — ABNORMAL HIGH (ref 65–99)
Glucose-Capillary: 152 mg/dL — ABNORMAL HIGH (ref 65–99)

## 2017-08-02 LAB — PROCALCITONIN

## 2017-08-02 MED ORDER — MIDAZOLAM HCL 2 MG/2ML IJ SOLN
2.0000 mg | INTRAMUSCULAR | Status: DC | PRN
  Administered 2017-08-02 – 2017-08-03 (×2): 2 mg via INTRAVENOUS
  Filled 2017-08-02 (×3): qty 2

## 2017-08-02 MED ORDER — FENTANYL BOLUS VIA INFUSION
50.0000 ug | INTRAVENOUS | Status: DC | PRN
  Administered 2017-08-03 – 2017-08-12 (×8): 50 ug via INTRAVENOUS
  Filled 2017-08-02: qty 50

## 2017-08-02 MED ORDER — MIDAZOLAM HCL 2 MG/2ML IJ SOLN
2.0000 mg | INTRAMUSCULAR | Status: DC | PRN
  Administered 2017-08-02 – 2017-08-03 (×2): 2 mg via INTRAVENOUS
  Filled 2017-08-02 (×2): qty 2

## 2017-08-02 MED ORDER — DOCUSATE SODIUM 50 MG/5ML PO LIQD
100.0000 mg | Freq: Two times a day (BID) | ORAL | Status: DC
  Administered 2017-08-02 – 2017-08-13 (×22): 100 mg
  Filled 2017-08-02 (×21): qty 10

## 2017-08-02 MED ORDER — POTASSIUM CHLORIDE 20 MEQ/15ML (10%) PO SOLN
20.0000 meq | Freq: Once | ORAL | Status: AC
Start: 2017-08-02 — End: 2017-08-02
  Administered 2017-08-02: 20 meq
  Filled 2017-08-02: qty 15

## 2017-08-02 MED ORDER — PRO-STAT SUGAR FREE PO LIQD
30.0000 mL | Freq: Two times a day (BID) | ORAL | Status: DC
  Administered 2017-08-02 – 2017-08-13 (×19): 30 mL

## 2017-08-02 MED ORDER — LACTATED RINGERS IV SOLN
INTRAVENOUS | Status: DC
  Administered 2017-08-02 (×2): via INTRAVENOUS

## 2017-08-02 MED ORDER — FUROSEMIDE 10 MG/ML IJ SOLN
40.0000 mg | Freq: Once | INTRAMUSCULAR | Status: AC
  Administered 2017-08-02: 40 mg via INTRAVENOUS
  Filled 2017-08-02: qty 4

## 2017-08-02 MED ORDER — VITAL HIGH PROTEIN PO LIQD
1000.0000 mL | ORAL | Status: DC
  Administered 2017-08-02 – 2017-08-03 (×2): 1000 mL
  Administered 2017-08-04: 07:00:00
  Administered 2017-08-04 – 2017-08-13 (×9): 1000 mL

## 2017-08-02 MED ORDER — FAMOTIDINE 20 MG PO TABS
20.0000 mg | ORAL_TABLET | Freq: Two times a day (BID) | ORAL | Status: DC
  Administered 2017-08-02 – 2017-08-13 (×24): 20 mg
  Filled 2017-08-02 (×25): qty 1

## 2017-08-02 MED ORDER — FENTANYL CITRATE (PF) 100 MCG/2ML IJ SOLN
50.0000 ug | Freq: Once | INTRAMUSCULAR | Status: AC
  Administered 2017-08-02: 50 ug via INTRAVENOUS
  Filled 2017-08-02: qty 2

## 2017-08-02 MED ORDER — FENTANYL 2500MCG IN NS 250ML (10MCG/ML) PREMIX INFUSION
25.0000 ug/h | INTRAVENOUS | Status: DC
  Administered 2017-08-02: 50 ug/h via INTRAVENOUS
  Administered 2017-08-02 – 2017-08-03 (×2): 300 ug/h via INTRAVENOUS
  Administered 2017-08-04 – 2017-08-07 (×7): 175 ug/h via INTRAVENOUS
  Administered 2017-08-08: 25 ug/h via INTRAVENOUS
  Administered 2017-08-08: 175 ug/h via INTRAVENOUS
  Administered 2017-08-10: 25 ug/h via INTRAVENOUS
  Administered 2017-08-12: 100 ug/h via INTRAVENOUS
  Filled 2017-08-02 (×14): qty 250

## 2017-08-02 MED ORDER — FENTANYL CITRATE (PF) 100 MCG/2ML IJ SOLN: 50.0000 ug | INTRAMUSCULAR | Status: DC | PRN

## 2017-08-02 NOTE — Progress Notes (Signed)
Name: [name] MRN: [id number] DOB: [birth year]    ADMISSION DATE:  07/29/2017 CONSULTATION DATE: 07/30/2017  REFERRING MD : Dr. Sherryll Burger   CHIEF COMPLAINT:   BRIEF PATIENT DESCRIPTION:  56 F presented to ED 03/17 and admitted to hospitalist service with hypoxemic respiratory failure, atypical chest pain, pericardial effusion (no tamponade by Echo).  Transferred to SDU 03/18 with worsening hypoxemia and complete opacification of left hemithorax on CXR.  Initially supported with BiPAP.  Intubated AM 03/19.  Very difficult airway!!!  MAJOR EVENTS/TEST RESULTS: 03/17 admission as documented above 03/17 CTA chest: No PE.  Moderate pericardial effusion and pericardial enhancement suggesting acute pericarditis.  Left greater than right pleural effusions with LLL atelectasis versus infiltrate. 03/17 TTE: LVEF 85%.  Grade 1 diastolic dysfunction.  No evidence of cardiac tamponade 03/18 repeat TTE: LVEF 75%.  Grade 1 diastolic dysfunction.  Small anterior and posterior pericardial effusion.  Moderate left pleural effusion.  No evidence of tamponade.  Mildly dilated RA, moderately dilated RV 03/19 CT chest: Extensive consolidation of left lung with minimal aeration of LUL.  Moderate left pleural effusion.  Patchy airspace disease in right lung. 03/20 much improved aeration of left lung on chest x-ray.  Cuff leak test negative.  Patient remains on PEEP 10 cm H2O and FiO2 60%. 03/20 ENT consultation requested for possible early trach versus high risk extubation in OR when meets criteria 3/21 + F/C on WUA. Remains on PEEP 8, FiO2 60%. Sedation regimen changed from propofol to fentanyl + intermittent midaz  INDWELLING DEVICES:: ETT 03/19 >>  L [place] CVL 03/19 >>   MICRO DATA: MRSA PCR 03/19 >> NEG Urine 03/17 >> multiple species present suggesting contaminated specimen Resp 03/19 >>   ANTIMICROBIALS:  Vanc 03/17 >> 03/17 Cefepime 03/17 >>     SUBJECTIVE:  Intubated, sedated. RASS -2. + F/C on  WUA. Synchronous with ventilator  VITAL SIGNS: Temp:  [98.2 F (36.8 C)-98.6 F (37 C)] 98.4 F (36.9 C) (03/21 1200) Pulse Rate:  [94-115] 111 (03/21 1400) Resp:  [15-25] 20 (03/21 1400) BP: (95-121)/(50-83) 114/75 (03/21 1400) SpO2:  [92 %-95 %] 92 % (03/21 1400) FiO2 (%):  [60 %] 60 % (03/21 1137) Weight:  [117.1 kg (258 lb 2.5 oz)] 117.1 kg (258 lb 2.5 oz) (03/21 0400)   HEMODYNAMICS:    VENTILATOR SETTINGS: Vent Mode: PRVC FiO2 (%):  [60 %] 60 % Set Rate:  [16 bmp] 16 bmp Vt Set:  [450 mL] 450 mL PEEP:  [8 cmH20] 8 cmH20  INTAKE / OUTPUT: I/O last 3 completed shifts: In: 4146 [I.V.:3226; NG/GT:120; IV Piggyback:800] Out: 2425 [Urine:2325; Emesis/NG output:100]  PHYSICAL EXAMINATION: General: Intubated, sedated, synchronous with ventilator Neuro: EOMI, PERRLA, moves all extremities HEENT: NCAT, sclerae white Cardiovascular: Distant heart sounds, regular, no M Lungs: Few scattered rhonchi, no wheezes Abdomen: Markedly obese, soft, NT, + BS Extremities: Warm, minimal ankle and pedal edema Skin: No lesions noted   LABS:  BMET Recent Labs  Lab 07/31/17 0648 08/01/17 0444 08/02/17 0418  NA 136 140 141  K 3.6 4.3 4.3  CL 93* 96* 100*  CO2 32 33* 33*  BUN 23* 22* 26*  CREATININE 1.49* 1.00 0.95  GLUCOSE 125* 148* 135*    Electrolytes Recent Labs  Lab 07/31/17 0648 08/01/17 0444 08/02/17 0418  CALCIUM 8.5* 9.1 9.1  MG  --  2.1  --   PHOS  --  4.5  --     CBC Recent Labs  Lab 07/30/17 0459 08/01/17  0444 08/02/17 0418  WBC 11.6* 13.1* 10.7  HGB 15.8 13.8 13.4  HCT 51.6* 44.5 44.3  PLT 382 392 350    Coag's Recent Labs  Lab 07/31/17 0701  APTT 37*  INR 1.21    Sepsis Markers Recent Labs  Lab 07/29/17 2001  07/31/17 0648 08/01/17 0444 08/02/17 0418  LATICACIDVEN 1.0  --   --   --   --   PROCALCITON  --    < > 0.13 <0.10 <0.10   < > = values in this interval not displayed.    ABG Recent Labs  Lab 07/31/17 1146  PHART  7.25*  PCO2ART 85*  PO2ART 91    Liver Enzymes Recent Labs  Lab 07/29/17 2235 08/01/17 0444  AST 38 18  ALT 36 23  ALKPHOS 100 80  BILITOT 2.1* 0.8  ALBUMIN 3.2* 2.7*    Cardiac Enzymes Recent Labs  Lab 07/29/17 1646  TROPONINI <0.03    Glucose Recent Labs  Lab 08/01/17 1937 08/01/17 2323 08/01/17 2340 08/02/17 0409 08/02/17 0749 08/02/17 1137  GLUCAP 120* 136* 118* 125* 128* 114*    CXR: Increased bibasilar opacities - infiltrate/effusion/atelectasis    ASSESSMENT / PLAN:  PULMONARY A: Acute hypoxemic respiratory failure Complete opacification of left hemithorax 03/19 due to atelectasis, effusion, likely pneumonia Smoker with suspected COPD Probable OSA (based on daughter's history) Very difficult airway!!! P:   Cont vent support - settings reviewed and/or adjusted Cont vent bundle Daily SBT if/when meets criteria Continue nebulized steroids and bronchodilators ENT input appreciated.  Dr. Willeen CassBennett requests reconsultation when extubation is a consideration  CARDIOVASCULAR A:  Pericardial effusion without evidence of tamponade Hemodynamically stable P:  Continue hemodynamic monitoring MAP goal >65 mmHg  RENAL A:   AKI, resolved Mild hypervolemia P:   Monitor BMET intermittently Monitor I/Os Correct electrolytes as indicated  Furosemide x1 dose 03/21  GASTROINTESTINAL A:   Severe obesity P:   SUP: Enteral famotidine Continue TF protocol initiated 03/20  HEMATOLOGIC A:   Polycythemia on admission P:  DVT px: SQ heparin Monitor CBC intermittently Transfuse per usual guidelines   INFECTIOUS A:   Probable pneumonia Possible acute pericarditis P:   Monitor temp, WBC count Micro and abx as above   ENDOCRINE A:   Mild hyperglycemia without documented history of diabetes Steroid-induced hyperglycemia P:   Moderate scale SSI  NEUROLOGIC A:   ICU/ventilator associated discomfort P:   RASS goal: -2, -3 Revised PAD protocol  03/21 from propofol infusion to fentanyl infusion + midozalam as needed   FAMILY: No family at bedside  CCM time: 35 mins The above time includes time spent in consultation with patient and/or family members and reviewing care plan on multidisciplinary rounds  Billy Fischeravid Simonds, MD PCCM service Mobile [phone] Pager [phone] 08/02/2017, 2:40 PM

## 2017-08-02 NOTE — Progress Notes (Signed)
[facility] - [place] at [facility]   PATIENT NAME: [name]    MR#:  [id number]  DATE OF BIRTH:  12/14/1965  SUBJECTIVE:   Intubated on the ventilator.  Patient on propofol for sedation.  FiO2 60% REVIEW OF SYSTEMS:   Review of Systems  Unable to perform ROS: Intubated   Tolerating Diet: Tube feedings  DRUG ALLERGIES:  No Known Allergies  VITALS:  Blood pressure 121/79, pulse (!) 109, temperature 98.4 F (36.9 C), temperature source Axillary, resp. rate 20, height 5' (1.524 m), weight 117.1 kg (258 lb 2.5 oz), SpO2 92 %.  PHYSICAL EXAMINATION:   Physical Exam  GENERAL:  52 y.o.-year-old patient critically ill lying in the bed with no acute distress.  Morbidly obese EYES: Pupils equal, round, reactive to light and accommodation. No scleral icterus. Extraocular muscles intact.  HEENT: Head atraumatic, normocephalic. Oropharynx and nasopharynx clear.  Tube dated on the ventilator NECK:  Supple, no jugular venous distention. No thyroid enlargement, no tenderness.  LUNGS: Normal breath sounds bilaterally, no wheezing, rales, rhonchi. No use of accessory muscles of respiration.  CARDIOVASCULAR: S1, S2 normal. No murmurs, rubs, or gallops.  ABDOMEN: Soft, nontender, nondistended. Bowel sounds present. No organomegaly or mass.  EXTREMITIES: No cyanosis, clubbing or edema b/l.    NEUROLOGIC: Intubated.   PSYCHIATRIC: Sedated on the vent SKIN: No obvious rash, lesion, or ulcer.   LABORATORY PANEL:  CBC Recent Labs  Lab 08/02/17 0418  WBC 10.7  HGB 13.4  HCT 44.3  PLT 350    Chemistries  Recent Labs  Lab 08/01/17 0444 08/02/17 0418  NA 140 141  K 4.3 4.3  CL 96* 100*  CO2 33* 33*  GLUCOSE 148* 135*  BUN 22* 26*  CREATININE 1.00 0.95  CALCIUM 9.1 9.1  MG 2.1  --   AST 18  --   ALT 23  --   ALKPHOS 80  --   BILITOT 0.8  --    Cardiac Enzymes Recent Labs  Lab 07/29/17 1646  TROPONINI <0.03   RADIOLOGY:  Dg Chest Port 1  View  Result Date: 08/02/2017 CLINICAL DATA:  52 year old female with respiratory failure. Left lung pneumonia. EXAM: PORTABLE CHEST 1 VIEW COMPARISON:  08/01/2017, chest CT 07/31/2017, and earlier. FINDINGS: Portable AP semi upright view at 0456 hours. Endotracheal tube tip at the level the clavicles. Enteric tube courses to the abdomen, tip not included. Stable left IJ or subclavian central line. Substantially improved left lung ventilation since 07/31/2017, although residual moderate confluent left lung base opacity remains. Interval increasing opacity at the right lung base. No pneumothorax. Chronic but increased underlying bilateral pulmonary interstitial opacity compared to 2018, possibly mild vascular congestion. Small bilateral pleural effusions are suspected. Stable cardiac size and mediastinal contours. Cardiomegaly and pericardial effusion noted on chest CT and CTA this month. Paucity of bowel gas in the upper abdomen. IMPRESSION: 1.  Stable lines and tubes. 2. Improved left lung multilobar pneumonia since 07/31/2017, but moderate residual at the lung base. 3. Worsening ventilation at the right lung base over that same time suspicious for right lung involvement now. 4. Stable cardiomegaly.  Mild vascular congestion/pulmonary edema. 5. Small bilateral pleural effusions suspected. Electronically Signed   By: Odessa Fleming M.D.   On: 08/02/2017 11:00   Dg Chest Port 1 View  Result Date: 08/01/2017 CLINICAL DATA:  Respiratory failure EXAM: PORTABLE CHEST 1 VIEW COMPARISON:  Chest radiograph and chest CT July 31, 2017 FINDINGS: Endotracheal tube tip is 3.7  cm above the carina. Central catheter tip is in the superior vena cava. No pneumothorax. There has been significant partial clearing on the left compared to 1 day prior. There has been the significant clearing of diffuse consolidation and effusion. There remains atelectatic change in the left base as well as small left effusion. There has been a clearing of  consolidation from the right upper lobe. Currently the right lung is clear except for atelectasis in the left base. There is cardiomegaly with pulmonary venous hypertension. No adenopathy evident. No bone lesions. IMPRESSION: Tube and catheter positions as described without pneumothorax. Significant clearing at the throughout the left lung and right upper lobe. There remains atelectatic change in each lower lung zone as well as underlying pulmonary vascular congestion. There is felt to be small left residual pleural effusion. Electronically Signed   By: Bretta BangWilliam  Woodruff III M.D.   On: 08/01/2017 07:56   ASSESSMENT AND PLAN:  52 yo female admitted for chest pain, pericardial effusion and acute on chronic hypoxic respiratory failure secondary to possible LLL pneumonia and acute CHF exacerbation.   * Acute on chronic respiratory failure with hypoxia   Secondary to acute on chronic diastolic congestive heart failure along with pneumonia, atelectesis, effusion - seen by CXR with complete opacification of left hemithorax - seen by Dr Thelma Bargeaks - chest tube not needed - - s/p bronch - moderate mucoid secretions and no endobronchial tumors, masses or foreign bodies.  No specimens were obtained - vent mgmt per PCCM - they Might consider early trach due to very difficult airway--- ENT did not recommend early tracheostomy at this time.  * acute on chronic diastolic congestive heart failure   IV Lasix, monitor intake and output,   Cardiology input appreciated  * pericardial effusion and pleural effusion   Likely secondary to CHF - no need of chest tube per Dr Thelma Bargeaks  * Hypotension with h/o HTN - HOLD all BP meds  *Nutrition continue tube feeding   Case discussed with Care Management/Social Worker. Management plans discussed with the patient, family and they are in agreement.  CODE STATUS: FUll  DVT Prophylaxis: lovenox  TOTAL TIME TAKING CARE OF THIS PATIENT: 25 minutes.  >50% time spent on  counselling and coordination of care  POSSIBLE D/C IN ? DAYS, DEPENDING ON CLINICAL CONDITION.  Note: This dictation was prepared with Dragon dictation along with smaller phrase technology. Any transcriptional errors that result from this process are unintentional.  Enedina FinnerSona Jimi Schappert M.D on 08/02/2017 at 1:58 PM  Between 7am to 6pm - Pager - [phone]  After 6pm go to [email or web address] - Social research officer, governmentpassword EPAS [email or web address]  [facility] [facility]  Office  [phone]  CC: Primary care physician; Cathlean Marseillesaaleman, Timothy P, MDPatient ID: [name], female   DOB: 09-17-1965, 52 y.o.   MRN: [id number]

## 2017-08-02 NOTE — Progress Notes (Signed)
Nutrition Follow-up  DOCUMENTATION CODES:   Morbid obesity  INTERVENTION:  Initiate new goal regimen of Vital High Protein at 40 mL/hr (960 mL goal daily volume) + Pro-Stat 30 mL BID via NGT. Provides 1160 kcal, 114 grams of protein, 806 mL H2O.  Continue liquid MVI daily per tube.  With free water flush of 200 mL Q8hrs patient is receiving total of 1406 mL H2O daily including water in tube feeding.  NUTRITION DIAGNOSIS:   Inadequate oral intake related to inability to eat as evidenced by NPO status.  Ongoing - addressing with TF regimen.  GOAL:   Provide needs based on ASPEN/SCCM guidelines  Met with TF regimen.  MONITOR:   Vent status, Labs, Weight trends, TF tolerance, I & O's  REASON FOR ASSESSMENT:   Ventilator, Consult Enteral/tube feeding initiation and management  ASSESSMENT:   52 year old female with PMHx of COPD, tobacco abuse, anxiety, essential HTN who was admitted with atypical chest pain, hypoxemic respiratory failure, and pericardial effusion and then transferred to SDU on 3/18 with worsening hypoxemia and complete opacification of left hemithorax suspected to be due to atelectasis initially requiring BiPAP but was intubated on AM of 3/19.  Patient intubated and sedated. Had a WUA this AM. Plan is to transition off of propofol today. Abdomen soft on NFPE.  Access: NGT placed 3/19; terminates in distal gastric body per abdominal x-ray 3/19; 60 cm at left nare  MAP: 72-95 mmHg  TF: pt has been tolerating Vital High Protein at 20 mL/hr  Patient is currently intubated on ventilator support MV: 8.3 L/min Temp (24hrs), Avg:98.4 F (36.9 C), Min:98.2 F (36.8 C), Max:98.6 F (37 C)  Propofol: N/A  Medications reviewed and include: Colace, famotidine, free water flush 200 mL Q8hrs, Novolog 0-15 units Q4hrs (received 8 units past 24 hrs), liquid MVI daily per tube, cefepime, fentanyl gtt. Off propofol now.   Labs reviewed: CBG 114-136, Chloride 100, CO2  33, BUN 26.  I/O: 1325 mL UOP yesterday (0.5 mL/kg/hr)  Weight trend: 117.1 kg on 3/21; -1.9 kg from admission  Discussed with RN and on rounds.   Diet Order:  No diet orders on file  EDUCATION NEEDS:   No education needs have been identified at this time  Skin:  Skin Assessment: Reviewed RN Assessment  Last BM:  07/30/2017 - no BM characteristics documented  Height:   Ht Readings from Last 1 Encounters:  07/31/17 5' (1.524 m)    Weight:   Wt Readings from Last 1 Encounters:  08/02/17 258 lb 2.5 oz (117.1 kg)    Ideal Body Weight:  45.5 kg  BMI:  Body mass index is 50.42 kg/m.  Estimated Nutritional Needs:   Kcal:  1143-1334 kcal (60-70% of PSU 2003b w/ MSJ 1716, Ve 8.3, Tmax 37.2); 22-25 kcal/kg IBW is 1001-1138 kcal  Protein:  >/= 114 grams (>/= 2.5 grams/kg IBW  Fluid:  1.4-1.6 L/day (30-35 mL/kg IBW)  Willey Blade, MS, RD, LDN Office: [phone] Pager: [phone] After Hours/Weekend Pager: [phone]

## 2017-08-02 NOTE — Progress Notes (Signed)
SUBJECTIVE: remains intubateed   Vitals:   08/02/17 0700 08/02/17 0800 08/02/17 0807 08/02/17 0900  BP: 104/63 117/73  114/68  Pulse: 97 (!) 103  (!) 104  Resp: 19 19  (!) 21  Temp: 98.4 F (36.9 C)     TempSrc: Axillary     SpO2: 94% 93% 94% 92%  Weight:      Height:        Intake/Output Summary (Last 24 hours) at 08/02/2017 1101 Last data filed at 08/02/2017 0746 Gross per 24 hour  Intake 2179.26 ml  Output 1275 ml  Net 904.26 ml    LABS: Basic Metabolic Panel: Recent Labs    08/01/17 0444 08/02/17 0418  NA 140 141  K 4.3 4.3  CL 96* 100*  CO2 33* 33*  GLUCOSE 148* 135*  BUN 22* 26*  CREATININE 1.00 0.95  CALCIUM 9.1 9.1  MG 2.1  --   PHOS 4.5  --    Liver Function Tests: Recent Labs    08/01/17 0444  AST 18  ALT 23  ALKPHOS 80  BILITOT 0.8  PROT 6.9  ALBUMIN 2.7*   No results for input(s): LIPASE, AMYLASE in the last 72 hours. CBC: Recent Labs    08/01/17 0444 08/02/17 0418  WBC 13.1* 10.7  HGB 13.8 13.4  HCT 44.5 44.3  MCV 78.1* 79.4*  PLT 392 350   Cardiac Enzymes: No results for input(s): CKTOTAL, CKMB, CKMBINDEX, TROPONINI in the last 72 hours. BNP: Invalid input(s): POCBNP D-Dimer: No results for input(s): DDIMER in the last 72 hours. Hemoglobin A1C: No results for input(s): HGBA1C in the last 72 hours. Fasting Lipid Panel: Recent Labs    07/31/17 1240  TRIG 121   Thyroid Function Tests: No results for input(s): TSH, T4TOTAL, T3FREE, THYROIDAB in the last 72 hours.  Invalid input(s): FREET3 Anemia Panel: No results for input(s): VITAMINB12, FOLATE, FERRITIN, TIBC, IRON, RETICCTPCT in the last 72 hours.   PHYSICAL EXAM General: Well developed, well nourished, in no acute distress HEENT:  Normocephalic and atramatic Neck:  No JVD.  Lungs: Clear bilaterally to auscultation and percussion. Heart: HRRR . Normal S1 and S2 without gallops or murmurs.  Abdomen: Bowel sounds are positive, abdomen soft and non-tender  Msk:  Back  normal, normal gait. Normal strength and tone for age. Extremities: No clubbing, cyanosis or edema.   Neuro: Alert and oriented X 3. Psych:  Good affect, responds appropriately  TELEMETRY: sinus tachycardia  ASSESSMENT AND PLAN: Respiratory failure with small pericardial effusion without tamponade. Remains intubated.Coronary calcification of LAD. Principal Problem:   Acute on chronic respiratory failure with hypoxia (HCC) Active Problems:   Acute respiratory failure (HCC)   Pericardial effusion    Evita Merida A, MD, [facility] HospitalFACC 08/02/2017 11:01 AM

## 2017-08-03 ENCOUNTER — Inpatient Hospital Stay: Payer: Medicaid Other

## 2017-08-03 DIAGNOSIS — J181 Lobar pneumonia, unspecified organism: Secondary | ICD-10-CM

## 2017-08-03 DIAGNOSIS — J80 Acute respiratory distress syndrome: Secondary | ICD-10-CM

## 2017-08-03 LAB — CBC
HCT: 46.6 % (ref 35.0–47.0)
Hemoglobin: 14.2 g/dL (ref 12.0–16.0)
MCH: 24.3 pg — ABNORMAL LOW (ref 26.0–34.0)
MCHC: 30.5 g/dL — AB (ref 32.0–36.0)
MCV: 79.5 fL — ABNORMAL LOW (ref 80.0–100.0)
PLATELETS: 394 10*3/uL (ref 150–440)
RBC: 5.87 MIL/uL — ABNORMAL HIGH (ref 3.80–5.20)
RDW: 21.4 % — AB (ref 11.5–14.5)
WBC: 10 10*3/uL (ref 3.6–11.0)

## 2017-08-03 LAB — BASIC METABOLIC PANEL
Anion gap: 8 (ref 5–15)
BUN: 31 mg/dL — AB (ref 6–20)
CALCIUM: 9.2 mg/dL (ref 8.9–10.3)
CO2: 37 mmol/L — ABNORMAL HIGH (ref 22–32)
Chloride: 95 mmol/L — ABNORMAL LOW (ref 101–111)
Creatinine, Ser: 0.91 mg/dL (ref 0.44–1.00)
GFR calc Af Amer: >60 mL/min (ref >60)
GLUCOSE: 155 mg/dL — AB (ref 65–99)
POTASSIUM: 4.5 mmol/L (ref 3.5–5.1)
SODIUM: 140 mmol/L (ref 135–145)

## 2017-08-03 LAB — CULTURE, RESPIRATORY W GRAM STAIN

## 2017-08-03 LAB — GLUCOSE, CAPILLARY
GLUCOSE-CAPILLARY: 159 mg/dL — AB (ref 65–99)
GLUCOSE-CAPILLARY: 169 mg/dL — AB (ref 65–99)
GLUCOSE-CAPILLARY: 153 mg/dL — AB (ref 65–99)
Glucose-Capillary: 130 mg/dL — ABNORMAL HIGH (ref 65–99)
Glucose-Capillary: 165 mg/dL — ABNORMAL HIGH (ref 65–99)

## 2017-08-03 LAB — CULTURE, RESPIRATORY: CULTURE: NORMAL

## 2017-08-03 LAB — BLOOD GAS, ARTERIAL
Acid-Base Excess: 12.8 mmol/L — ABNORMAL HIGH (ref 0.0–2.0)
Allens test (pass/fail): POSITIVE — AB
Bicarbonate: 43.8 mmol/L — ABNORMAL HIGH (ref 20.0–28.0)
FIO2: 95
LHR: 16 {breaths}/min
O2 Saturation: 96.7 %
PEEP/CPAP: 5 cmH2O
PH ART: 7.3 — AB (ref 7.350–7.450)
Patient temperature: 37
VT: 450 mL
pCO2 arterial: 89 mmHg (ref 32.0–48.0)
pO2, Arterial: 96 mmHg (ref 83.0–108.0)

## 2017-08-03 LAB — PROCALCITONIN: Procalcitonin: <0.1 ng/mL

## 2017-08-03 LAB — TRIGLYCERIDES: TRIGLYCERIDES: 247 mg/dL — AB (ref <150)

## 2017-08-03 MED ORDER — PROPOFOL 1000 MG/100ML IV EMUL
INTRAVENOUS | Status: AC
  Filled 2017-08-03: qty 100

## 2017-08-03 MED ORDER — PROPOFOL 1000 MG/100ML IV EMUL
5.0000 ug/kg/min | INTRAVENOUS | Status: DC
  Administered 2017-08-03 (×2): 40 ug/kg/min via INTRAVENOUS
  Administered 2017-08-03: 18 ug/kg/min via INTRAVENOUS
  Administered 2017-08-03: 20 ug/kg/min via INTRAVENOUS
  Administered 2017-08-03 – 2017-08-04 (×2): 9 ug/kg/min via INTRAVENOUS
  Filled 2017-08-03 (×5): qty 100

## 2017-08-03 MED ORDER — METHYLPREDNISOLONE SODIUM SUCC 40 MG IJ SOLR
40.0000 mg | Freq: Two times a day (BID) | INTRAMUSCULAR | Status: DC
  Administered 2017-08-03 – 2017-08-04 (×2): 40 mg via INTRAVENOUS
  Filled 2017-08-03 (×2): qty 1

## 2017-08-03 MED ORDER — SENNA 8.6 MG PO TABS
1.0000 | ORAL_TABLET | Freq: Once | ORAL | Status: AC
  Administered 2017-08-03: 8.6 mg via ORAL
  Filled 2017-08-03: qty 1

## 2017-08-03 MED ORDER — FUROSEMIDE 10 MG/ML IJ SOLN
20.0000 mg | Freq: Two times a day (BID) | INTRAMUSCULAR | Status: DC
  Administered 2017-08-04: 20 mg via INTRAVENOUS
  Filled 2017-08-03: qty 2

## 2017-08-03 MED ORDER — FUROSEMIDE 10 MG/ML IJ SOLN
20.0000 mg | Freq: Once | INTRAMUSCULAR | Status: AC
  Administered 2017-08-03: 20 mg via INTRAVENOUS

## 2017-08-03 MED ORDER — METHYLPREDNISOLONE SODIUM SUCC 125 MG IJ SOLR
INTRAMUSCULAR | Status: AC
Start: 2017-08-03 — End: 2017-08-03
  Filled 2017-08-03: qty 2

## 2017-08-03 MED ORDER — METHYLPREDNISOLONE SODIUM SUCC 125 MG IJ SOLR
80.0000 mg | INTRAMUSCULAR | Status: AC
  Administered 2017-08-03: 80 mg via INTRAVENOUS

## 2017-08-03 MED ORDER — SENNOSIDES-DOCUSATE SODIUM 8.6-50 MG PO TABS
1.0000 | ORAL_TABLET | Freq: Two times a day (BID) | ORAL | Status: DC
  Administered 2017-08-03 – 2017-08-13 (×19): 1 via ORAL
  Filled 2017-08-03 (×22): qty 1

## 2017-08-03 MED ORDER — FUROSEMIDE 10 MG/ML IJ SOLN
INTRAMUSCULAR | Status: AC
  Filled 2017-08-03: qty 2

## 2017-08-03 MED ORDER — FREE WATER
100.0000 mL | Freq: Three times a day (TID) | Status: DC
  Administered 2017-08-03 – 2017-08-11 (×16): 100 mL

## 2017-08-03 NOTE — Progress Notes (Signed)
[facility] - [place] [facility]   PATIENT NAME: [name]    MR#:  [id number]  DATE OF BIRTH:  01-26-1966  SUBJECTIVE:   Intubated on the ventilator.  Patient on propofol for sedation.  FiO2 100% Patient sats dropped early morning. Patient is difficult to wean. REVIEW OF SYSTEMS:   Review of Systems  Unable to perform ROS: Intubated   Tolerating Diet: Tube feedings  DRUG ALLERGIES:  No Known Allergies  VITALS:  Blood pressure 111/69, pulse 86, temperature 99.8 F (37.7 C), temperature source Axillary, resp. rate 16, height 5' (1.524 m), weight 117.1 kg (258 lb 2.5 oz), SpO2 95 %.  PHYSICAL EXAMINATION:   Physical Exam  GENERAL:  52 y.o.-year-old patient critically ill lying in the bed with no acute distress.  Morbidly obese EYES: Pupils equal, round, reactive to light and accommodation. No scleral icterus. Extraocular muscles intact.  HEENT: Head atraumatic, normocephalic. Oropharynx and nasopharynx clear.  Tube dated on the ventilator NECK:  Supple, no jugular venous distention. No thyroid enlargement, no tenderness.  LUNGS: Normal breath sounds bilaterally, no wheezing, rales, rhonchi. No use of accessory muscles of respiration.  CARDIOVASCULAR: S1, S2 normal. No murmurs, rubs, or gallops.  ABDOMEN: Soft, nontender, nondistended. Bowel sounds present. No organomegaly or mass.  EXTREMITIES: No cyanosis, clubbing or edema b/l.    NEUROLOGIC: Intubated.   PSYCHIATRIC: Sedated on the vent SKIN: No obvious rash, lesion, or ulcer.   LABORATORY PANEL:  CBC Recent Labs  Lab 08/03/17 0354  WBC 10.0  HGB 14.2  HCT 46.6  PLT 394    Chemistries  Recent Labs  Lab 08/01/17 0444  08/03/17 0354  NA 140   < > 140  K 4.3   < > 4.5  CL 96*   < > 95*  CO2 33*   < > 37*  GLUCOSE 148*   < > 155*  BUN 22*   < > 31*  CREATININE 1.00   < > 0.91  CALCIUM 9.1   < > 9.2  MG 2.1  --   --   AST 18  --   --   ALT 23  --   --   ALKPHOS 80  --   --    BILITOT 0.8  --   --    < > = values in this interval not displayed.   Cardiac Enzymes Recent Labs  Lab 07/29/17 1646  TROPONINI <0.03   RADIOLOGY:  Dg Chest Port 1 View  Result Date: 08/03/2017 CLINICAL DATA:  Acute respiratory failure. EXAM: PORTABLE CHEST 1 VIEW COMPARISON:  Radiograph yesterday at 0456 hour.  Chest CT 07/31/2016 FINDINGS: Endotracheal tube tip at the thoracic inlet. Enteric tube in place, tip below the diaphragm. Left central line with tip in the proximal SVC. Unchanged cardiomegaly. Unchanged vascular congestion/pulmonary edema. Bibasilar opacities, left greater than right, unchanged. No pneumothorax. IMPRESSION: Unchanged appearance of the chest with cardiomegaly, pulmonary edema/vascular congestion, and bibasilar opacities, left greater than right. Electronically Signed   By: Rubye OaksMelanie  Ehinger M.D.   On: 08/03/2017 01:48   Dg Chest Port 1 View  Result Date: 08/02/2017 CLINICAL DATA:  52 year old female with respiratory failure. Left lung pneumonia. EXAM: PORTABLE CHEST 1 VIEW COMPARISON:  08/01/2017, chest CT 07/31/2017, and earlier. FINDINGS: Portable AP semi upright view at 0456 hours. Endotracheal tube tip at the level the clavicles. Enteric tube courses to the abdomen, tip not included. Stable left IJ or subclavian central line. Substantially improved left lung ventilation  since 07/31/2017, although residual moderate confluent left lung base opacity remains. Interval increasing opacity at the right lung base. No pneumothorax. Chronic but increased underlying bilateral pulmonary interstitial opacity compared to 2018, possibly mild vascular congestion. Small bilateral pleural effusions are suspected. Stable cardiac size and mediastinal contours. Cardiomegaly and pericardial effusion noted on chest CT and CTA this month. Paucity of bowel gas in the upper abdomen. IMPRESSION: 1.  Stable lines and tubes. 2. Improved left lung multilobar pneumonia since 07/31/2017, but  moderate residual at the lung base. 3. Worsening ventilation at the right lung base over that same time suspicious for right lung involvement now. 4. Stable cardiomegaly.  Mild vascular congestion/pulmonary edema. 5. Small bilateral pleural effusions suspected. Electronically Signed   By: Odessa Fleming M.D.   On: 08/02/2017 11:00   ASSESSMENT AND PLAN:  52 yo female admitted for chest pain, pericardial effusion and acute on chronic hypoxic respiratory failure secondary to possible LLL pneumonia and acute CHF exacerbation.   * Acute on chronic respiratory failure with hypoxia   Secondary to acute on chronic diastolic congestive heart failure along with pneumonia, atelectesis, effusion - seen by CXR with complete opacification of left hemithorax - seen by Dr Thelma Barge - chest tube not needed - - s/p bronch - moderate mucoid secretions and no endobronchial tumors, masses or foreign bodies.  No specimens were obtained - vent mgmt per PCCM - they Might consider early trach due to very difficult airway--- ENT did not recommend early tracheostomy at this time. -Weaning trials have been failed so far  * acute on chronic diastolic congestive heart failure   IV Lasix, monitor intake and output,   Cardiology input appreciated  * pericardial effusion and pleural effusion   Likely secondary to CHF - no need of chest tube per Dr Thelma Barge  * Hypotension with h/o HTN - HOLD all BP meds  *Nutrition continue tube feeding   Case discussed with Care Management/Social Worker. Management plans discussed with the patient, family and they are in agreement.  CODE STATUS: FUll  DVT Prophylaxis: lovenox  TOTAL TIME TAKING CARE OF THIS PATIENT: 25 minutes.  >50% time spent on counselling and coordination of care  POSSIBLE D/C IN ? DAYS, DEPENDING ON CLINICAL CONDITION.  Note: This dictation was prepared with Dragon dictation along with smaller phrase technology. Any transcriptional errors that result from this  process are unintentional.  Enedina Finner M.D on 08/03/2017 at 2:40 PM  Between 7am to 6pm - Pager - [phone]  After 6pm go to [email or web address] - Social research officer, government  [facility] [place] [facility]  Office  [phone]  CC: Primary care physician; Cathlean Marseilles, MDPatient ID: Reinaldo Meeker, female   DOB: 01-29-1966, 52 y.o.   MRN: [id number]

## 2017-08-03 NOTE — Progress Notes (Signed)
At finish of giving patient a bath around 2330. Pt began to desat and no sign of chest rise and fall. Consulting civil engineerCharge RN and NP to bedside. Pt. Bagged 2 mg of versed given, fent bolus. Pt sats normalized, placed back on vent resting comfortably on 100% Fio2 vent support. Around 0200 pt daughter came to visit and at bedside. Situation explained to daughter. As RN going to leave room, pt vent alarming and pt began to desat and and EtCo2 began to increase. Pt given 2 mg of versed, bagged until sats normalized, propfol restarted per order. 80mg  of solu-medrol and 20mg  of lasix given. Pt placed back 100% vent support. NP spoke with daughter at bedside. Will continue to monitor.

## 2017-08-03 NOTE — Progress Notes (Signed)
RT called to room for pt desat, pt bagged, lavaged for small mucus plugs, pt placed on 100% FiO2. Will slowly decrease FiO2 as tol t/o shift

## 2017-08-03 NOTE — Progress Notes (Signed)
Nutrition Follow-up  DOCUMENTATION CODES:   Morbid obesity  INTERVENTION:  Vital High Protein at 72m/hr Pro-stat 642mTID  Provides 960 calories, 122 grams of protein, 30192mree water  200m31mee water every 8 hours per MD --> total free water 901mL9mth propofol at 15.5mL/h58m409.2 calories), provides 1369 total calories  NUTRITION DIAGNOSIS:   Inadequate oral intake related to inability to eat as evidenced by NPO status. -ongoing  GOAL:   Provide needs based on ASPEN/SCCM guidelines -met with TF  MONITOR:   Vent status, Labs, Weight trends, TF tolerance, I & O's  ASSESSMENT:   51 yea51old female with PMHx of COPD, tobacco abuse, anxiety, essential HTN who was admitted with atypical chest pain, hypoxemic respiratory failure, and pericardial effusion and then transferred to SDU on 3/18 with worsening hypoxemia and complete opacification of left hemithorax suspected to be due to atelectasis initially requiring BiPAP but was intubated on AM of 3/19.  Patient is currently intubated on ventilator support MV: 7.1 L/min Temp (24hrs), Avg:98.6 F (37 C), Min:97.9 F (36.6 C), Max:98.9 F (37.2 C) Propofol: 15.5 ml/hr --> 409.2 calories  Access: NGT placed 3/19, terminates in distal gastric body, 60cm at L Nare  Was tolerating tubefeed but now adjusting regimen due to propofol use.  Labs reviewed:  Triglycerides 247  Medications reviewed and include:  Colace, Insulin, Solumedrol, Senokot, Senokot-S, MVI Liquid  MAP: 67-84  UOP over last 24 hrs appears to have been 602mL, 67mL ou88m far today.  Diet Order:  No diet orders on file  EDUCATION NEEDS:   No education needs have been identified at this time  Skin:  Skin Assessment: Reviewed RN Assessment  Last BM:  07/30/2017 - no BM characteristics documented  Height:   Ht Readings from Last 1 Encounters:  07/31/17 5' (1.524 m)    Weight:   Wt Readings from Last 1 Encounters:  08/02/17 258 lb 2.5 oz  (117.1 kg)    Ideal Body Weight:  45.5 kg  BMI:  Body mass index is 50.42 kg/m.  Estimated Nutritional Needs:   Kcal:  1143-1334 kcal (60-70% of PSU 2003b w/ MSJ 1716, Ve 8.3, Tmax 37.2); 22-25 kcal/kg IBW is 1001-1138 kcal  Protein:  >/= 114 grams (>/= 2.5 grams/kg IBW  Fluid:  1.4-1.6 L/day (30-35 mL/kg IBW)  Blanca Thornton Satira AnisMS, RD LDN Inpatient Clinical Dietitian Pager [phone]

## 2017-08-03 NOTE — Progress Notes (Signed)
SUBJECTIVE: Remains intubated and sedated.   Vitals:   08/03/17 0600 08/03/17 0700 08/03/17 0732 08/03/17 0800  BP: 98/69 95/64  96/64  Pulse: 76 74  77  Resp: 16 16  16   Temp:   98.6 F (37 C)   TempSrc:   Oral   SpO2: 94% 94%  93%  Weight:      Height:        Intake/Output Summary (Last 24 hours) at 08/03/2017 0840 Last data filed at 08/03/2017 0810 Gross per 24 hour  Intake 2649.56 ml  Output 5845 ml  Net -3195.44 ml    LABS: Basic Metabolic Panel: Recent Labs    08/01/17 0444 08/02/17 0418 08/03/17 0354  NA 140 141 140  K 4.3 4.3 4.5  CL 96* 100* 95*  CO2 33* 33* 37*  GLUCOSE 148* 135* 155*  BUN 22* 26* 31*  CREATININE 1.00 0.95 0.91  CALCIUM 9.1 9.1 9.2  MG 2.1  --   --   PHOS 4.5  --   --    Liver Function Tests: Recent Labs    08/01/17 0444  AST 18  ALT 23  ALKPHOS 80  BILITOT 0.8  PROT 6.9  ALBUMIN 2.7*   No results for input(s): LIPASE, AMYLASE in the last 72 hours. CBC: Recent Labs    08/02/17 0418 08/03/17 0354  WBC 10.7 10.0  HGB 13.4 14.2  HCT 44.3 46.6  MCV 79.4* 79.5*  PLT 350 394   Cardiac Enzymes: No results for input(s): CKTOTAL, CKMB, CKMBINDEX, TROPONINI in the last 72 hours. BNP: Invalid input(s): POCBNP D-Dimer: No results for input(s): DDIMER in the last 72 hours. Hemoglobin A1C: No results for input(s): HGBA1C in the last 72 hours. Fasting Lipid Panel: Recent Labs    08/03/17 0354  TRIG 247*   Thyroid Function Tests: No results for input(s): TSH, T4TOTAL, T3FREE, THYROIDAB in the last 72 hours.  Invalid input(s): FREET3 Anemia Panel: No results for input(s): VITAMINB12, FOLATE, FERRITIN, TIBC, IRON, RETICCTPCT in the last 72 hours.   PHYSICAL EXAM General: Intubated and sedated HEENT:  Normocephalic and atramatic Neck:  No JVD.  Lungs: Clear bilaterally to auscultation and percussion. Heart: HRRR . Normal S1 and S2 without gallops or murmurs.  Abdomen: Bowel sounds are positive, abdomen soft and  non-tender  Msk:  Back normal, normal gait. Normal strength and tone for age. Extremities: No clubbing, cyanosis or edema.   Neuro:  Intubated and sedated. Psych:  Intubated and sedated.  TELEMETRY: NSR 72bpm  ASSESSMENT AND PLAN: Respiratory failure secondary to COPD, acute on chronic CHF, and small pericardial effusion without tamponade. Remains intubated.Continue respiratory support and will continue to monitor cardiac status.    Principal Problem:   Acute on chronic respiratory failure with hypoxia (HCC) Active Problems:   Acute respiratory failure (HCC)   Pericardial effusion    Angel HammanKristin Juriel Cid, NP-C 08/03/2017 8:40 AM Cell: [phone]

## 2017-08-03 NOTE — Progress Notes (Signed)
Name: [name] MRN: [id number] DOB: August 09, 1965    ADMISSION DATE:  07/29/2017 CONSULTATION DATE: 07/30/2017  REFERRING MD : Dr. Sherryll BurgerShah   CHIEF COMPLAINT:   BRIEF PATIENT DESCRIPTION:  [age] F presented to ED 03/17 and admitted to hospitalist service with hypoxemic respiratory failure, atypical chest pain, pericardial effusion (no tamponade by Echo).  Transferred to SDU 03/18 with worsening hypoxemia and complete opacification of left hemithorax on CXR.  Initially supported with BiPAP.  Intubated AM 03/19.  Very difficult airway!!!  MAJOR EVENTS/TEST RESULTS: 03/17 admission as documented above 03/17 CTA chest: No PE.  Moderate pericardial effusion and pericardial enhancement suggesting acute pericarditis.  Left greater than right pleural effusions with LLL atelectasis versus infiltrate. 03/17 TTE: LVEF 85%.  Grade 1 diastolic dysfunction.  No evidence of cardiac tamponade 03/18 repeat TTE: LVEF 75%.  Grade 1 diastolic dysfunction.  Small anterior and posterior pericardial effusion.  Moderate left pleural effusion.  No evidence of tamponade.  Mildly dilated RA, moderately dilated RV 03/19 CT chest: Extensive consolidation of left lung with minimal aeration of LUL.  Moderate left pleural effusion.  Patchy airspace disease in right lung. 03/20 much improved aeration of left lung on chest x-ray.  Cuff leak test negative.  Patient remains on PEEP 10 cm H2O and FiO2 60%. 03/20 ENT consultation requested for possible early trach versus high risk extubation in OR when meets criteria 3/21 + F/C on WUA. Remains on PEEP 8, FiO2 60%. Sedation regimen changed from propofol to fentanyl + intermittent midaz 3/22 Episode of desaturation in the night, responded to Lasix, Steroids; Propofol added for sedation  INDWELLING DEVICES:: ETT 03/19 >>  L [place] CVL 03/19 >>   MICRO DATA: MRSA PCR 03/19 >> NEG Urine 03/17 >> multiple species present suggesting contaminated specimen Resp 03/19 >>    ANTIMICROBIALS:  Vanc 03/17 >> 03/17 Cefepime 03/17 >>     SUBJECTIVE:  Intubated, sedated. RASS -2. + F/C on WUA. Synchronous with ventilator  VITAL SIGNS: Temp:  [97.9 F (36.6 C)-98.9 F (37.2 C)] 98.6 F (37 C) (03/22 0732) Pulse Rate:  [74-113] 86 (03/22 0900) Resp:  [8-23] 16 (03/22 0900) BP: (82-121)/(50-83) 111/69 (03/22 0900) SpO2:  [55 %-97 %] 95 % (03/22 0900) FiO2 (%):  [60 %-100 %] 100 % (03/22 1103)   HEMODYNAMICS:    VENTILATOR SETTINGS: Vent Mode: PRVC FiO2 (%):  [60 %-100 %] 100 % Set Rate:  [16 bmp] 16 bmp Vt Set:  [450 mL] 450 mL PEEP:  [8 cmH20] 8 cmH20  INTAKE / OUTPUT: I/O last 3 completed shifts: In: 4343.7 [I.V.:2514.1; NG/GT:1229.7; IV Piggyback:600] Out: 6695 [Urine:6695]  PHYSICAL EXAMINATION: General: Intubated, sedated, synchronous with ventilator Neuro: EOMI, PERRLA, moves all extremities HEENT: NCAT, sclerae white Cardiovascular: Distant heart sounds, regular, no M Lungs: Few scattered rhonchi, no wheezes Abdomen: Markedly obese, soft, NT, + BS Extremities: Warm, minimal ankle and pedal edema Skin: No lesions noted   LABS:  BMET Recent Labs  Lab 08/01/17 0444 08/02/17 0418 08/03/17 0354  NA 140 141 140  K 4.3 4.3 4.5  CL 96* 100* 95*  CO2 33* 33* 37*  BUN 22* 26* 31*  CREATININE 1.00 0.95 0.91  GLUCOSE 148* 135* 155*    Electrolytes Recent Labs  Lab 08/01/17 0444 08/02/17 0418 08/03/17 0354  CALCIUM 9.1 9.1 9.2  MG 2.1  --   --   PHOS 4.5  --   --     CBC Recent Labs  Lab 08/01/17 0444 08/02/17 0418  08/03/17 0354  WBC 13.1* 10.7 10.0  HGB 13.8 13.4 14.2  HCT 44.5 44.3 46.6  PLT 392 350 394    Coag's Recent Labs  Lab 07/31/17 0701  APTT 37*  INR 1.21    Sepsis Markers Recent Labs  Lab 07/29/17 2001  07/31/17 0648 08/01/17 0444 08/02/17 0418  LATICACIDVEN 1.0  --   --   --   --   PROCALCITON  --    < > 0.13 <0.10 <0.10   < > = values in this interval not displayed.    ABG Recent  Labs  Lab 07/31/17 1146  PHART 7.25*  PCO2ART 85*  PO2ART 91    Liver Enzymes Recent Labs  Lab 07/29/17 2235 08/01/17 0444  AST 38 18  ALT 36 23  ALKPHOS 100 80  BILITOT 2.1* 0.8  ALBUMIN 3.2* 2.7*    Cardiac Enzymes Recent Labs  Lab 07/29/17 1646  TROPONINI <0.03    Glucose Recent Labs  Lab 08/02/17 1137 08/02/17 1638 08/02/17 1924 08/02/17 2324 08/03/17 0400 08/03/17 0726  GLUCAP 114* 119* 116* 152* 130* 159*    CXR: Increased bibasilar opacities - infiltrate/effusion/atelectasis    ASSESSMENT / PLAN:  PULMONARY A: Acute hypoxemic respiratory failure with ARDS Complete opacification of left hemithorax 03/19 due to atelectasis improved, effusion, likely pneumonia Smoker with suspected COPD Probable OSA (based on daughter's history) Very difficult airway!!! P:   Cont vent support - settings reviewed and/or adjusted Cont vent bundle Daily SBT if/when meets criteria Continue nebulized steroids and bronchodilators ENT input appreciated.  Dr. Willeen Cass requests reconsultation when extubation is a consideration Will increase PEEP recruitment, keep in negative fluid balance  CARDIOVASCULAR A:  Small Pericardial effusion without evidence of tamponade Hemodynamically stable P:  Continue hemodynamic monitoring MAP goal >65 mmHg  RENAL A:   AKI, resolved Mild hypervolemia P:   Monitor BMET intermittently Monitor I/Os Correct electrolytes as indicated  Furosemide x1 dose 03/21; will monitor urinary output and keep in negative fluid balance, Start low dose maintenance Lasix  GASTROINTESTINAL A:   Severe obesity P:   SUP: Enteral famotidine Continue TF protocol initiated 03/20  HEMATOLOGIC A:   Polycythemia on admission P:  DVT px: SQ heparin Monitor CBC intermittently Transfuse per usual guidelines   INFECTIOUS A:   Probable pneumonia Possible acute pericarditis P:   Monitor temp, WBC count Micro and abx as above   ENDOCRINE A:     Mild hyperglycemia without documented history of diabetes Steroid-induced hyperglycemia P:   Moderate scale SSI, target Glucose monitoring/ management  NEUROLOGIC A:   ICU/ventilator associated discomfort P:   RASS goal: -2, -3 Revised PAD protocol- back on propofol infusion with fentanyl infusion + midozalam as needed   FAMILY: Daughter Valentina Gu updated via telephone  CCM time: 35 mins The above time includes time spent in consultation with patient and/or family members and reviewing care plan on multidisciplinary rounds   Jackson Latino, MD PCCM service Pager [phone] 08/03/2017, 11:48 AM

## 2017-08-04 ENCOUNTER — Inpatient Hospital Stay: Payer: Medicaid Other

## 2017-08-04 DIAGNOSIS — J441 Chronic obstructive pulmonary disease with (acute) exacerbation: Secondary | ICD-10-CM

## 2017-08-04 LAB — BLOOD GAS, ARTERIAL
ACID-BASE EXCESS: 16.4 mmol/L — AB (ref 0.0–2.0)
Acid-Base Excess: 18.9 mmol/L — ABNORMAL HIGH (ref 0.0–2.0)
BICARBONATE: 47.5 mmol/L — AB (ref 20.0–28.0)
BICARBONATE: 46.9 mmol/L — AB (ref 20.0–28.0)
FIO2: 95
FIO2: 0.9
MECHVT: 450 mL
O2 SAT: 95 %
O2 SAT: 98.7 %
PATIENT TEMPERATURE: 37
PCO2 ART: 90 mmHg — AB (ref 32.0–48.0)
PCO2 ART: 66 mmHg — AB (ref 32.0–48.0)
PEEP/CPAP: 12 cmH2O
PEEP/CPAP: 12 cmH2O
PH ART: 7.33 — AB (ref 7.350–7.450)
PH ART: 7.46 — AB (ref 7.350–7.450)
PO2 ART: 115 mmHg — AB (ref 83.0–108.0)
Patient temperature: 37
RATE: 16 resp/min
RATE: 22 resp/min
VT: 450 mL
pO2, Arterial: 81 mmHg — ABNORMAL LOW (ref 83.0–108.0)

## 2017-08-04 LAB — BASIC METABOLIC PANEL
Anion gap: 6 (ref 5–15)
BUN: 36 mg/dL — AB (ref 6–20)
CHLORIDE: 98 mmol/L — AB (ref 101–111)
CO2: 37 mmol/L — AB (ref 22–32)
CREATININE: 0.68 mg/dL (ref 0.44–1.00)
Calcium: 9.2 mg/dL (ref 8.9–10.3)
GFR calc Af Amer: >60 mL/min (ref >60)
GFR calc non Af Amer: >60 mL/min (ref >60)
Glucose, Bld: 151 mg/dL — ABNORMAL HIGH (ref 65–99)
POTASSIUM: 5.4 mmol/L — AB (ref 3.5–5.1)
Sodium: 141 mmol/L (ref 135–145)

## 2017-08-04 LAB — GLUCOSE, CAPILLARY
GLUCOSE-CAPILLARY: 144 mg/dL — AB (ref 65–99)
GLUCOSE-CAPILLARY: 146 mg/dL — AB (ref 65–99)
GLUCOSE-CAPILLARY: 184 mg/dL — AB (ref 65–99)
GLUCOSE-CAPILLARY: 184 mg/dL — AB (ref 65–99)
GLUCOSE-CAPILLARY: 197 mg/dL — AB (ref 65–99)
Glucose-Capillary: 153 mg/dL — ABNORMAL HIGH (ref 65–99)
Glucose-Capillary: 150 mg/dL — ABNORMAL HIGH (ref 65–99)

## 2017-08-04 LAB — MAGNESIUM: Magnesium: 2.3 mg/dL (ref 1.7–2.4)

## 2017-08-04 LAB — PHOSPHORUS: Phosphorus: 3.1 mg/dL (ref 2.5–4.6)

## 2017-08-04 LAB — POTASSIUM: Potassium: 5 mmol/L (ref 3.5–5.1)

## 2017-08-04 LAB — BRAIN NATRIURETIC PEPTIDE: B NATRIURETIC PEPTIDE 5: 176 pg/mL — AB (ref 0.0–100.0)

## 2017-08-04 MED ORDER — BUDESONIDE 0.5 MG/2ML IN SUSP
0.5000 mg | Freq: Two times a day (BID) | RESPIRATORY_TRACT | Status: DC
  Administered 2017-08-04 – 2017-08-17 (×25): 0.5 mg via RESPIRATORY_TRACT
  Filled 2017-08-04 (×26): qty 2

## 2017-08-04 MED ORDER — ALTEPLASE 2 MG IJ SOLR
2.0000 mg | Freq: Once | INTRAMUSCULAR | Status: AC
Start: 2017-08-04 — End: 2017-08-04
  Administered 2017-08-04: 2 mg
  Filled 2017-08-04: qty 2

## 2017-08-04 MED ORDER — FUROSEMIDE 10 MG/ML IJ SOLN
40.0000 mg | Freq: Two times a day (BID) | INTRAMUSCULAR | Status: DC
Start: 2017-08-04 — End: 2017-08-05
  Administered 2017-08-04 – 2017-08-05 (×3): 40 mg via INTRAVENOUS
  Filled 2017-08-04 (×3): qty 4

## 2017-08-04 MED ORDER — SODIUM CHLORIDE 0.9% FLUSH
10.0000 mL | Freq: Two times a day (BID) | INTRAVENOUS | Status: DC
  Administered 2017-08-04: 10 mL
  Administered 2017-08-04: 20 mL
  Administered 2017-08-05 – 2017-08-07 (×5): 10 mL
  Administered 2017-08-08: 30 mL
  Administered 2017-08-08 – 2017-08-13 (×11): 10 mL
  Administered 2017-08-14: 30 mL
  Administered 2017-08-14: 10 mL
  Administered 2017-08-15: 30 mL
  Administered 2017-08-16 – 2017-08-17 (×4): 10 mL

## 2017-08-04 MED ORDER — IPRATROPIUM-ALBUTEROL 0.5-2.5 (3) MG/3ML IN SOLN
3.0000 mL | RESPIRATORY_TRACT | Status: DC
  Administered 2017-08-04 – 2017-08-16 (×73): 3 mL via RESPIRATORY_TRACT
  Filled 2017-08-04 (×70): qty 3

## 2017-08-04 MED ORDER — METHYLPREDNISOLONE SODIUM SUCC 40 MG IJ SOLR
40.0000 mg | Freq: Three times a day (TID) | INTRAMUSCULAR | Status: DC
  Administered 2017-08-04 – 2017-08-08 (×12): 40 mg via INTRAVENOUS
  Filled 2017-08-04 (×12): qty 1

## 2017-08-04 NOTE — Progress Notes (Signed)
Respiratory culture obtained and sent to lab.

## 2017-08-04 NOTE — Progress Notes (Signed)
Decreased FIO2 to 80%  

## 2017-08-04 NOTE — Progress Notes (Signed)
[facility] - [place] [facility]   PATIENT NAME: [name]    MR#:  [id number]  DATE OF BIRTH:  10/08/1965  SUBJECTIVE:   Intubated on the ventilator.  Patient on propofol for sedation.  FiO2 90% Patient sats dropped early morning. Patient is difficult to wean. REVIEW OF SYSTEMS:   Review of Systems  Unable to perform ROS: Intubated   Tolerating Diet: Tube feedings  DRUG ALLERGIES:  No Known Allergies  VITALS:  Blood pressure (!) 104/54, pulse 74, temperature 98.4 F (36.9 C), temperature source Axillary, resp. rate (!) 22, height 5' (1.524 m), weight 117.1 kg (258 lb 2.5 oz), SpO2 96 %.  PHYSICAL EXAMINATION:   Physical Exam  GENERAL:  52 y.o.-year-old patient critically ill lying in the bed with no acute distress.  Morbidly obese EYES: Pupils equal, round, reactive to light and accommodation. No scleral icterus. Extraocular muscles intact.  HEENT: Head atraumatic, normocephalic. Oropharynx and nasopharynx clear.  Tube dated on the ventilator NECK:  Supple, no jugular venous distention. No thyroid enlargement, no tenderness.  LUNGS: Normal breath sounds bilaterally, no wheezing, rales, rhonchi. No use of accessory muscles of respiration.  CARDIOVASCULAR: S1, S2 normal. No murmurs, rubs, or gallops.  ABDOMEN: Soft, nontender, nondistended. Bowel sounds present. No organomegaly or mass.  EXTREMITIES: No cyanosis, clubbing or edema b/l.    NEUROLOGIC: Intubated.   PSYCHIATRIC: Sedated on the vent SKIN: No obvious rash, lesion, or ulcer.   LABORATORY PANEL:  CBC Recent Labs  Lab 08/03/17 0354  WBC 10.0  HGB 14.2  HCT 46.6  PLT 394    Chemistries  Recent Labs  Lab 08/01/17 0444  08/04/17 0630 08/04/17 1013  NA 140   < > 141  --   K 4.3   < > 5.4* 5.0  CL 96*   < > 98*  --   CO2 33*   < > 37*  --   GLUCOSE 148*   < > 151*  --   BUN 22*   < > 36*  --   CREATININE 1.00   < > 0.68  --   CALCIUM 9.1   < > 9.2  --   MG 2.1  --   2.3  --   AST 18  --   --   --   ALT 23  --   --   --   ALKPHOS 80  --   --   --   BILITOT 0.8  --   --   --    < > = values in this interval not displayed.   Cardiac Enzymes Recent Labs  Lab 07/29/17 1646  TROPONINI <0.03   RADIOLOGY:  Dg Chest Port 1 View  Result Date: 08/04/2017 CLINICAL DATA:  Respiratory failure. EXAM: PORTABLE CHEST 1 VIEW COMPARISON:  Yesterday. FINDINGS: Endotracheal tube in satisfactory position. Nasogastric tube extending the stomach. Stable left subclavian catheter. Increased patchy opacity in the perihilar regions bilaterally. No significant change in patchy density interstitial prominence elsewhere in the right lung. Increased left pleural fluid. Stable enlarged cardiac silhouette. No acute bony abnormality. IMPRESSION: 1. Mild worsening of changes of pulmonary edema. 2. Increased left pleural fluid. 3. Stable cardiomegaly. Electronically Signed   By: Beckie SaltsSteven  Reid M.D.   On: 08/04/2017 07:05   Dg Chest Port 1 View  Result Date: 08/03/2017 CLINICAL DATA:  Acute respiratory failure. EXAM: PORTABLE CHEST 1 VIEW COMPARISON:  Radiograph yesterday at 0456 hour.  Chest CT 07/31/2016 FINDINGS: Endotracheal tube  tip at the thoracic inlet. Enteric tube in place, tip below the diaphragm. Left central line with tip in the proximal SVC. Unchanged cardiomegaly. Unchanged vascular congestion/pulmonary edema. Bibasilar opacities, left greater than right, unchanged. No pneumothorax. IMPRESSION: Unchanged appearance of the chest with cardiomegaly, pulmonary edema/vascular congestion, and bibasilar opacities, left greater than right. Electronically Signed   By: Rubye Oaks M.D.   On: 08/03/2017 01:48   ASSESSMENT AND PLAN:  52 yo female admitted for chest pain, pericardial effusion and acute on chronic hypoxic respiratory failure secondary to possible LLL pneumonia and acute CHF exacerbation.   * Acute on chronic respiratory failure with hypoxia   Secondary to acute on  chronic diastolic congestive heart failure along with pneumonia, atelectesis, effusion - seen by CXR with complete opacification of left hemithorax - seen by Dr Thelma Barge - chest tube not needed - - s/p bronch - moderate mucoid secretions and no endobronchial tumors, masses or foreign bodies.  No specimens were obtained - vent mgmt per PCCM - they Might consider early trach due to very difficult airway--- ENT did not recommend early tracheostomy at this time. -Weaning trials have been failed so far  *Hyperkalemia Potassium is back to 5.0, continue monitoring  * acute on chronic diastolic congestive heart failure   IV Lasix frequency increased, monitor intake and output,   Cardiology input appreciated  * pericardial effusion and pleural effusion   Likely secondary to CHF continue IV Lasix - no need of chest tube per Dr Thelma Barge  * Hypotension with h/o HTN - HOLD all BP meds  *Nutrition continue tube feeding   Case discussed with Care Management/Social Worker. Management plans discussed with the patient, family and they are in agreement.  CODE STATUS: FUll  DVT Prophylaxis: lovenox  TOTAL TIME TAKING CARE OF THIS PATIENT: 25 minutes.  >50% time spent on counselling and coordination of care  POSSIBLE D/C IN ? DAYS, DEPENDING ON CLINICAL CONDITION.  Note: This dictation was prepared with Dragon dictation along with smaller phrase technology. Any transcriptional errors that result from this process are unintentional.  Ramonita Lab M.D on 08/04/2017 at 2:27 PM  Between 7am to 6pm - Pager - [phone]  After 6pm go to [email or web address] - Social research officer, government  [facility]  Office  [phone]  CC: Primary care physician; Cathlean Marseilles, MDPatient ID: [name], female   DOB: 04/24/1966, 52 y.o.   MRN: [id number]

## 2017-08-04 NOTE — Progress Notes (Signed)
Decreased fio2 to 70% 

## 2017-08-04 NOTE — Progress Notes (Signed)
Patient rested comfortably for most of shift. Patient able to follow commands and communicate through yes/no questions. Tried to write but unable to coordinate the pen. Moves all extremities weakly. Sedation titrated to maintain RASS goal, one bolus for pain during shift. Ventilator settings adjusted per RT and MD based on patient condition and ABG results. No BM this shift (and no BM charted since 07/30/17). Administered both scheduled medications per tube and gave prn suppository. Belly soft, with active bowel sounds. Daughter visited in person and was updated by RN and MD. Also called twice for RN updates.

## 2017-08-04 NOTE — Progress Notes (Signed)
Name: [name] MRN: [id number] DOB: 09-06-1965    ADMISSION DATE:  07/29/2017 CONSULTATION DATE: 07/30/2017  REFERRING MD : Dr. Sherryll Burger   CHIEF COMPLAINT:   BRIEF PATIENT DESCRIPTION:  67 F presented to ED 03/17 and admitted to hospitalist service with hypoxemic respiratory failure, atypical chest pain, pericardial effusion (no tamponade by Echo).  Transferred to SDU 03/18 with worsening hypoxemia and complete opacification of left hemithorax on CXR.  Initially supported with BiPAP.  Intubated AM 03/19.  Very difficult airway!!!  MAJOR EVENTS/TEST RESULTS: 03/17 admission as documented above 03/17 CTA chest: No PE.  Moderate pericardial effusion and pericardial enhancement suggesting acute pericarditis.  Left greater than right pleural effusions with LLL atelectasis versus infiltrate. 03/17 TTE: LVEF 85%.  Grade 1 diastolic dysfunction.  No evidence of cardiac tamponade 03/18 repeat TTE: LVEF 75%.  Grade 1 diastolic dysfunction.  Small anterior and posterior pericardial effusion.  Moderate left pleural effusion.  No evidence of tamponade.  Mildly dilated RA, moderately dilated RV 03/19 CT chest: Extensive consolidation of left lung with minimal aeration of LUL.  Moderate left pleural effusion.  Patchy airspace disease in right lung. 03/20 much improved aeration of left lung on chest x-ray.  Cuff leak test negative.  Patient remains on PEEP 10 cm H2O and FiO2 60%. 03/20 ENT consultation requested for possible early trach versus high risk extubation in OR when meets criteria 3/21 + F/C on WUA. Remains on PEEP 8, FiO2 60%. Sedation regimen changed from propofol to fentanyl + intermittent midaz 3/22 Episode of desaturation in the night, responded to Lasix, Steroids; Propofol added for sedation  INDWELLING DEVICES:: ETT 03/19 >>  L [place] CVL 03/19 >>   MICRO DATA: MRSA PCR 03/19 >> NEG Urine 03/17 >> multiple species present suggesting contaminated specimen Resp 03/19 >>    ANTIMICROBIALS:  Vanc 03/17 >> 03/17 Cefepime 03/17 >>     SUBJECTIVE:  Intubated, sedated. RASS -2. + F/C on WUA. Synchronous with ventilator  VITAL SIGNS: Temp:  [97.1 F (36.2 C)-99.8 F (37.7 C)] 99.1 F (37.3 C) (03/23 0739) Pulse Rate:  [63-87] 73 (03/23 1100) Resp:  [15-24] 22 (03/23 1100) BP: (91-127)/(47-71) 109/48 (03/23 1100) SpO2:  [91 %-98 %] 97 % (03/23 1100) FiO2 (%):  [90 %-95 %] 90 % (03/23 1100)   HEMODYNAMICS:  No compromise   VENTILATOR SETTINGS: Vent Mode: PRVC FiO2 (%):  [90 %-95 %] 90 % Set Rate:  [16 bmp] 16 bmp Vt Set:  [450 mL] 450 mL PEEP:  [12 cmH20] 12 cmH20 Plateau Pressure:  [28 cmH20] 28 cmH20  INTAKE / OUTPUT: I/O last 3 completed shifts: In: 3095 [I.V.:1199.7; WU/JW:1191.4; IV Piggyback:300] Out: 2795 [Urine:2795]  PHYSICAL EXAMINATION: General: Intubated, sedated, synchronous with ventilator Neuro: EOMI, PERRLA, moves all extremities, nods appropriately HEENT: NCAT, sclerae white Cardiovascular: Distant heart sounds, regular, no M Lungs: Few scattered rhonchi, no wheezes Abdomen: Markedly obese, soft, NT, + BS Extremities: Warm, minimal ankle and pedal edema Skin: No lesions noted   LABS:  BMET Recent Labs  Lab 08/02/17 0418 08/03/17 0354 08/04/17 0630 08/04/17 1013  NA 141 140 141  --   K 4.3 4.5 5.4* 5.0  CL 100* 95* 98*  --   CO2 33* 37* 37*  --   BUN 26* 31* 36*  --   CREATININE 0.95 0.91 0.68  --   GLUCOSE 135* 155* 151*  --     Electrolytes Recent Labs  Lab 08/01/17 0444 08/02/17 0418 08/03/17 0354 08/04/17 0630  CALCIUM 9.1 9.1 9.2 9.2  MG 2.1  --   --  2.3  PHOS 4.5  --   --  3.1    CBC Recent Labs  Lab 08/01/17 0444 08/02/17 0418 08/03/17 0354  WBC 13.1* 10.7 10.0  HGB 13.8 13.4 14.2  HCT 44.5 44.3 46.6  PLT 392 350 394    Coag's Recent Labs  Lab 07/31/17 0701  APTT 37*  INR 1.21    Sepsis Markers Recent Labs  Lab 07/29/17 2001  08/01/17 0444 08/02/17 0418  08/03/17 1924  LATICACIDVEN 1.0  --   --   --   --   PROCALCITON  --    < > <0.10 <0.10 <0.10   < > = values in this interval not displayed.    ABG Recent Labs  Lab 07/31/17 1146 08/03/17 1325 08/04/17 0500  PHART 7.25* 7.30* 7.33*  PCO2ART 85* 89* 90*  PO2ART 91 96 81*    Liver Enzymes Recent Labs  Lab 07/29/17 2235 08/01/17 0444  AST 38 18  ALT 36 23  ALKPHOS 100 80  BILITOT 2.1* 0.8  ALBUMIN 3.2* 2.7*    Cardiac Enzymes Recent Labs  Lab 07/29/17 1646  TROPONINI <0.03    Glucose Recent Labs  Lab 08/03/17 1138 08/03/17 1622 08/03/17 1931 08/04/17 0032 08/04/17 0440 08/04/17 0755  GLUCAP 169* 165* 153* 146* 153* 150*    CXR: Increased bibasilar opacities - infiltrate/effusion/atelectasis    ASSESSMENT / PLAN:  PULMONARY A: Acute hypoxemic respiratory failure with ARDS, hypercarbica this AM so vent rate increaserd Complete opacification of left hemithorax 03/19 due to atelectasis improved, effusion, likely pneumonia although procalcitonin is negative. Will culture respiratory aspirates if negative, will de-escalate ABX. Smoker with suspected COPD with exacerbation Probable OSA (based on daughter's history) Very difficult airway!!! P:   Cont vent support - settings reviewed and/or adjusted Cont vent bundle Continue nebulized steroids, bronchodilators, lasix ENT input appreciated.  Dr. Willeen CassBennett requests reconsultation when extubation is a consideration Will increase PEEP recruitment, keep in negative fluid balance Culture respiratory aspirates, deescalate ABX if Neg.  CARDIOVASCULAR A:  Small Pericardial effusion without evidence of tamponade Hemodynamically stable P:  Continue hemodynamic monitoring MAP goal >65 mmHg  RENAL A:   AKI, resolved Mild hypervolemia P:   Monitor BMET intermittently Monitor I/Os Correct electrolytes as indicated  Furosemide x1 dose 03/21; will monitor urinary output and keep in negative fluid balance, Start  low dose maintenance Lasix  GASTROINTESTINAL A:   Severe obesity P:   SUP: Enteral famotidine Continue TF protocol initiated 03/20  HEMATOLOGIC A:   Polycythemia on admission P:  DVT px: SQ heparin Monitor CBC intermittently Transfuse per usual guidelines   INFECTIOUS A:   Probable pneumonia Possible acute pericarditis P:   Monitor temp, WBC count Micro and abx as above   ENDOCRINE A:   Mild hyperglycemia without documented history of diabetes Steroid-induced hyperglycemia P:   Moderate scale SSI, target Glucose monitoring/ management  NEUROLOGIC A:   ICU/ventilator associated discomfort P:   RASS goal: -2, -3 Revised PAD protocol- back on propofol infusion with fentanyl infusion + midozalam as needed   FAMILY: Daughter Valentina GuKemia Brown updated in person.  CCM time: 40 mins The above time includes time spent in consultation with patient and/or family members and reviewing care plan on multidisciplinary rounds   Jackson LatinoKarol Marykathryn Carboni, MD PCCM service Pager [phone] 08/04/2017, 11:31 AM

## 2017-08-04 NOTE — Progress Notes (Signed)
Decreased fio2 to 90%

## 2017-08-05 ENCOUNTER — Inpatient Hospital Stay: Payer: Medicaid Other

## 2017-08-05 LAB — GLUCOSE, CAPILLARY
GLUCOSE-CAPILLARY: 169 mg/dL — AB (ref 65–99)
GLUCOSE-CAPILLARY: 191 mg/dL — AB (ref 65–99)
GLUCOSE-CAPILLARY: 164 mg/dL — AB (ref 65–99)
GLUCOSE-CAPILLARY: 147 mg/dL — AB (ref 65–99)
Glucose-Capillary: 162 mg/dL — ABNORMAL HIGH (ref 65–99)

## 2017-08-05 LAB — RENAL FUNCTION PANEL
ANION GAP: 8 (ref 5–15)
Albumin: 2.9 g/dL — ABNORMAL LOW (ref 3.5–5.0)
Albumin: 3.3 g/dL — ABNORMAL LOW (ref 3.5–5.0)
Anion gap: 11 (ref 5–15)
BUN: 44 mg/dL — AB (ref 6–20)
BUN: 43 mg/dL — ABNORMAL HIGH (ref 6–20)
CHLORIDE: 91 mmol/L — AB (ref 101–111)
CO2: 37 mmol/L — AB (ref 22–32)
CO2: 37 mmol/L — ABNORMAL HIGH (ref 22–32)
Calcium: 9.2 mg/dL (ref 8.9–10.3)
Calcium: 9.5 mg/dL (ref 8.9–10.3)
Chloride: 95 mmol/L — ABNORMAL LOW (ref 101–111)
Creatinine, Ser: 0.77 mg/dL (ref 0.44–1.00)
Creatinine, Ser: 0.7 mg/dL (ref 0.44–1.00)
GFR calc Af Amer: >60 mL/min (ref >60)
GFR calc non Af Amer: >60 mL/min (ref >60)
GLUCOSE: 174 mg/dL — AB (ref 65–99)
Glucose, Bld: 198 mg/dL — ABNORMAL HIGH (ref 65–99)
POTASSIUM: 4.8 mmol/L (ref 3.5–5.1)
POTASSIUM: 4.7 mmol/L (ref 3.5–5.1)
Phosphorus: 3.3 mg/dL (ref 2.5–4.6)
Phosphorus: 3.6 mg/dL (ref 2.5–4.6)
SODIUM: 140 mmol/L (ref 135–145)
Sodium: 139 mmol/L (ref 135–145)

## 2017-08-05 LAB — CBC
HEMATOCRIT: 44.3 % (ref 35.0–47.0)
Hemoglobin: 13.6 g/dL (ref 12.0–16.0)
MCH: 24.3 pg — AB (ref 26.0–34.0)
MCHC: 30.8 g/dL — AB (ref 32.0–36.0)
MCV: 78.9 fL — ABNORMAL LOW (ref 80.0–100.0)
Platelets: 375 10*3/uL (ref 150–440)
RBC: 5.61 MIL/uL — ABNORMAL HIGH (ref 3.80–5.20)
RDW: 21 % — AB (ref 11.5–14.5)
WBC: 9.9 10*3/uL (ref 3.6–11.0)

## 2017-08-05 LAB — MAGNESIUM: Magnesium: 2.3 mg/dL (ref 1.7–2.4)

## 2017-08-05 MED ORDER — FUROSEMIDE 10 MG/ML IJ SOLN
40.0000 mg | Freq: Every day | INTRAMUSCULAR | Status: DC
  Administered 2017-08-06 – 2017-08-08 (×3): 40 mg via INTRAVENOUS
  Filled 2017-08-05 (×3): qty 4

## 2017-08-05 NOTE — Progress Notes (Signed)
Name: [name] MRN: [id number] DOB: Mar 05, 1966    ADMISSION DATE:  07/29/2017 CONSULTATION DATE: 07/30/2017  REFERRING MD : Dr. Sherryll Burger   CHIEF COMPLAINT:   BRIEF PATIENT DESCRIPTION:  74 F presented to ED 03/17 and admitted to hospitalist service with hypoxemic respiratory failure, atypical chest pain, pericardial effusion (no tamponade by Echo).  Transferred to SDU 03/18 with worsening hypoxemia and complete opacification of left hemithorax on CXR.  Initially supported with BiPAP.  Intubated AM 03/19.  Very difficult airway!!!  MAJOR EVENTS/TEST RESULTS: 03/17 admission as documented above 03/17 CTA chest: No PE.  Moderate pericardial effusion and pericardial enhancement suggesting acute pericarditis.  Left greater than right pleural effusions with LLL atelectasis versus infiltrate. 03/17 TTE: LVEF 85%.  Grade 1 diastolic dysfunction.  No evidence of cardiac tamponade 03/18 repeat TTE: LVEF 75%.  Grade 1 diastolic dysfunction.  Small anterior and posterior pericardial effusion.  Moderate left pleural effusion.  No evidence of tamponade.  Mildly dilated RA, moderately dilated RV 03/19 CT chest: Extensive consolidation of left lung with minimal aeration of LUL.  Moderate left pleural effusion.  Patchy airspace disease in right lung. 03/20 much improved aeration of left lung on chest x-ray.  Cuff leak test negative.  Patient remains on PEEP 10 cm H2O and FiO2 60%. 03/20 ENT consultation requested for possible early trach versus high risk extubation in OR when meets criteria 3/21 + F/C on WUA. Remains on PEEP 8, FiO2 60%. Sedation regimen changed from propofol to fentanyl + intermittent midaz 3/22 Episode of desaturation in the night, responded to Lasix, Steroids; Propofol added for sedation  INDWELLING DEVICES:: ETT 03/19 >>  L Dewy Rose CVL 03/19 >>   MICRO DATA: MRSA PCR 03/19 >> NEG Urine 03/17 >> multiple species present suggesting contaminated specimen Resp 03/19 >>    ANTIMICROBIALS:  Vanc 03/17 >> 03/17 Cefepime 03/17 >>     SUBJECTIVE:  Intubated, sedated. RASS -2. + F/C on WUA. Synchronous with ventilator  VITAL SIGNS: Temp:  [98.3 F (36.8 C)-99.1 F (37.3 C)] 98.5 F (36.9 C) (03/24 1700) Pulse Rate:  [65-86] 67 (03/24 1800) Resp:  [22] 22 (03/24 1800) BP: (99-129)/(53-95) 125/71 (03/24 1800) SpO2:  [81 %-97 %] 81 % (03/24 1800) FiO2 (%):  [60 %-70 %] 60 % (03/24 1514)   HEMODYNAMICS:  No compromise   VENTILATOR SETTINGS: Vent Mode: PRVC FiO2 (%):  [60 %-70 %] 60 % Set Rate:  [22 bmp] 22 bmp Vt Set:  [450 mL-4580 mL] 4580 mL PEEP:  [12 cmH20] 12 cmH20 Plateau Pressure:  [18 cmH20-29 cmH20] 18 cmH20  INTAKE / OUTPUT: I/O last 3 completed shifts: In: 2879 [I.V.:621; NG/GT:1758; IV Piggyback:500] Out: 8205 [Urine:8205]  PHYSICAL EXAMINATION: General: Intubated but awake and approriate Neuro: EOMI, PERRLA, moves all extremities, nods appropriately HEENT: NCAT, sclerae white Cardiovascular: Distant heart sounds, regular, no M Lungs: Few scattered rhonchi, no wheezes Abdomen: Markedly obese, soft, NT, + BS Extremities: Warm, minimal ankle and pedal edema Skin: No lesions noted   LABS:  BMET Recent Labs  Lab 08/04/17 0630 08/04/17 1013 08/05/17 0532 08/05/17 0859  NA 141  --  140 139  K 5.4* 5.0 4.8 4.7  CL 98*  --  95* 91*  CO2 37*  --  37* 37*  BUN 36*  --  44* 43*  CREATININE 0.68  --  0.77 0.70  GLUCOSE 151*  --  174* 198*    Electrolytes Recent Labs  Lab 08/01/17 0444  08/04/17 0630 08/05/17 0532  08/05/17 0859  CALCIUM 9.1   < > 9.2 9.2 9.5  MG 2.1  --  2.3 2.3  --   PHOS 4.5  --  3.1 3.3 3.6   < > = values in this interval not displayed.    CBC Recent Labs  Lab 08/02/17 0418 08/03/17 0354 08/05/17 0532  WBC 10.7 10.0 9.9  HGB 13.4 14.2 13.6  HCT 44.3 46.6 44.3  PLT 350 394 375    Coag's Recent Labs  Lab 07/31/17 0701  APTT 37*  INR 1.21    Sepsis Markers Recent Labs  Lab  07/29/17 2001  08/01/17 0444 08/02/17 0418 08/03/17 1924  LATICACIDVEN 1.0  --   --   --   --   PROCALCITON  --    < > <0.10 <0.10 <0.10   < > = values in this interval not displayed.    ABG Recent Labs  Lab 08/03/17 1325 08/04/17 0500 08/04/17 1153  PHART 7.30* 7.33* 7.46*  PCO2ART 89* 90* 66*  PO2ART 96 81* 115*    Liver Enzymes Recent Labs  Lab 07/29/17 2235 08/01/17 0444 08/05/17 0532 08/05/17 0859  AST 38 18  --   --   ALT 36 23  --   --   ALKPHOS 100 80  --   --   BILITOT 2.1* 0.8  --   --   ALBUMIN 3.2* 2.7* 2.9* 3.3*    Cardiac Enzymes No results for input(s): TROPONINI, PROBNP in the last 168 hours.  Glucose Recent Labs  Lab 08/04/17 1547 08/04/17 1943 08/04/17 2340 08/05/17 0408 08/05/17 1209 08/05/17 1617  GLUCAP 144* 184* 197* 162* 191* 164*    CXR: Increased bibasilar opacities - infiltrate/effusion/atelectasis    ASSESSMENT / PLAN:  PULMONARY A: Acute hypoxemic respiratory failure with ARDS, oxygenating better today Complete opacification of left hemithorax 03/19 due to atelectasis improved, effusion, likely pneumonia although procalcitonin is negative. Repeated tracheal culture is negative thus far Smoker with suspected COPD with exacerbation Probable OSA (based on daughter's history) Very difficult airway!!! P:   Cont vent support - settings reviewed and/or adjusted Cont vent bundle Continue nebulized steroids, bronchodilators, lasix ENT input appreciated.  Dr. Willeen CassBennett requests reconsultation when extubation is a consideration Will increase PEEP recruitment, keep in negative fluid balance but will decrease Lasix to daily today as BUN trending upward which may be from steroids as well. Continue Cefepime  CARDIOVASCULAR A:  Small Pericardial effusion without evidence of tamponade Hemodynamically stable P:  Continue hemodynamic monitoring MAP goal >65 mmHg  RENAL A:   AKI, resolved P:   Monitor BMET intermittently Monitor  I/Os Correct electrolytes as indicated  Will change Lasix to Q 24 hours today;  will monitor urinary output and keep in negative fluid balance  GASTROINTESTINAL A:   Severe obesity P:   SUP: Enteral famotidine Continue TF protocol initiated 03/20  HEMATOLOGIC A:   Polycythemia on admission P:  DVT px: SQ heparin Monitor CBC intermittently Transfuse per usual guidelines   INFECTIOUS A:   Probable pneumonia Possible acute pericarditis P:   Monitor temp, WBC count Micro and abx as above   ENDOCRINE A:   Mild hyperglycemia without documented history of diabetes Steroid-induced hyperglycemia P:   Moderate scale SSI, target Glucose monitoring/ management  NEUROLOGIC A:   ICU/ventilator associated discomfort P:   RASS goal: -2, -3 Revised PAD protocol- back on propofol infusion with fentanyl infusion + midozalam as needed   FAMILY: Daughter Valentina GuKemia Brown updated in person.  CCM  time: 37 mins The above time includes time spent in consultation with patient and/or family members and reviewing care plan on multidisciplinary rounds   Jackson Latino, MD PCCM service Pager [phone] 08/05/2017, 7:26 PM

## 2017-08-05 NOTE — Progress Notes (Signed)
[facility] - [place] [facility]   PATIENT NAME: [name]    MR#:  [id number]  DATE OF BIRTH:  [birth year]  SUBJECTIVE:   Intubated on the ventilator.  Patient has very difficult airway patient on propofol for sedation.   Patient is difficult to wean.  Family members at bedside REVIEW OF SYSTEMS:   Review of Systems  Unable to perform ROS: Intubated   Tolerating Diet: Tube feedings  DRUG ALLERGIES:  No Known Allergies  VITALS:  Blood pressure (!) 117/57, pulse 73, temperature 99 F (37.2 C), temperature source Oral, resp. rate (!) 22, height 5' (1.524 m), weight 117.1 kg (258 lb 2.5 oz), SpO2 95 %.  PHYSICAL EXAMINATION:   Physical Exam  GENERAL:  52 y.o.-year-old patient critically ill lying in the bed with no acute distress.  Morbidly obese EYES: Pupils equal, round, reactive to light and accommodation. No scleral icterus. Extraocular muscles intact.  HEENT: Head atraumatic, normocephalic. Oropharynx and nasopharynx clear.  Tube dated on the ventilator NECK:  Supple, no jugular venous distention. No thyroid enlargement, no tenderness.  LUNGS: Normal breath sounds bilaterally, no wheezing, rales, rhonchi. No use of accessory muscles of respiration.  CARDIOVASCULAR: S1, S2 normal. No murmurs, rubs, or gallops.  ABDOMEN: Soft, nontender, nondistended. Bowel sounds present. No organomegaly or mass.  EXTREMITIES: No cyanosis, clubbing or edema b/l.    NEUROLOGIC: Intubated.   PSYCHIATRIC: Sedated on the vent SKIN: No obvious rash, lesion, or ulcer.   LABORATORY PANEL:  CBC Recent Labs  Lab 08/05/17 0532  WBC 9.9  HGB 13.6  HCT 44.3  PLT 375    Chemistries  Recent Labs  Lab 08/01/17 0444  08/05/17 0532 08/05/17 0859  NA 140   < > 140 139  K 4.3   < > 4.8 4.7  CL 96*   < > 95* 91*  CO2 33*   < > 37* 37*  GLUCOSE 148*   < > 174* 198*  BUN 22*   < > 44* 43*  CREATININE 1.00   < > 0.77 0.70  CALCIUM 9.1   < > 9.2 9.5  MG 2.1   <  > 2.3  --   AST 18  --   --   --   ALT 23  --   --   --   ALKPHOS 80  --   --   --   BILITOT 0.8  --   --   --    < > = values in this interval not displayed.   Cardiac Enzymes Recent Labs  Lab 07/29/17 1646  TROPONINI <0.03   RADIOLOGY:  Dg Chest Port 1 View  Result Date: 08/05/2017 CLINICAL DATA:  Acute respiratory failure, COPD, hypertension, smoker EXAM: PORTABLE CHEST 1 VIEW COMPARISON:  Portable exam 0548 hours compared to 08/04/2017 FINDINGS: Tip of endotracheal tube projects 4.1 cm above carina. Nasogastric tube extends into stomach. LEFT subclavian central venous catheter tip projects over SVC. Numerous EKG leads project over chest. Enlargement of cardiac silhouette with pulmonary vascular congestion. Scattered interstitial infiltrates likely representing pulmonary edema. Decreased LEFT pleural effusion. LEFT lower lobe opacification could represent atelectasis or coexistent consolidation. No pneumothorax. IMPRESSION: Persistent pulmonary edema with decreased LEFT pleural effusion. LEFT lower lobe atelectasis versus consolidation. Electronically Signed   By: Ulyses SouthwardMark  Boles M.D.   On: 08/05/2017 08:11   Dg Chest Port 1 View  Result Date: 08/04/2017 CLINICAL DATA:  Respiratory failure. EXAM: PORTABLE CHEST 1 VIEW COMPARISON:  Yesterday.  FINDINGS: Endotracheal tube in satisfactory position. Nasogastric tube extending the stomach. Stable left subclavian catheter. Increased patchy opacity in the perihilar regions bilaterally. No significant change in patchy density interstitial prominence elsewhere in the right lung. Increased left pleural fluid. Stable enlarged cardiac silhouette. No acute bony abnormality. IMPRESSION: 1. Mild worsening of changes of pulmonary edema. 2. Increased left pleural fluid. 3. Stable cardiomegaly. Electronically Signed   By: Beckie Salts M.D.   On: 08/04/2017 07:05   ASSESSMENT AND PLAN:  52 yo female admitted for chest pain, pericardial effusion and acute on  chronic hypoxic respiratory failure secondary to possible LLL pneumonia and acute CHF exacerbation.   * Acute on chronic respiratory failure with hypoxia Patient has a very difficult airway   Secondary to acute on chronic diastolic congestive heart failure along with pneumonia, atelectesis, effusion - seen by CXR with complete opacification of left hemithorax - seen by Dr Thelma Barge - chest tube not needed - - s/p bronch - moderate mucoid secretions and no endobronchial tumors, masses or foreign bodies.  No specimens were obtained - vent mgmt per PCCM - they Might consider early trach due to very difficult airway--- ENT did not recommend early tracheostomy at this time. -Weaning trials have been failed so far  *Hyperkalemia Resolved  * acute on chronic diastolic congestive heart failure   IV Lasix frequency increased, monitor intake and output,   Cardiology input appreciated  * pericardial effusion and pleural effusion no pericardial tamponade   Likely secondary to CHF continue IV Lasix - no need of chest tube per Dr Thelma Barge  * Hypotension with h/o HTN - HOLD all BP meds  *Nutrition continue tube feeding- vital  care discussed with intensivist Case discussed with Care Management/Social Worker. Management plans discussed with the patient, family and they are in agreement.  CODE STATUS: FUll  DVT Prophylaxis: lovenox  TOTAL TIME TAKING CARE OF THIS PATIENT: 29 minutes.  >50% time spent on counselling and coordination of care  POSSIBLE D/C IN ? DAYS, DEPENDING ON CLINICAL CONDITION.  Note: This dictation was prepared with Dragon dictation along with smaller phrase technology. Any transcriptional errors that result from this process are unintentional.  Ramonita Lab M.D on 08/05/2017 at 3:22 PM  Between 7am to 6pm - Pager - [phone]  After 6pm go to [email or web address] - Social research officer, government  [facility]  Office  [phone]  CC: Primary care physician; Cathlean Marseilles, MDPatient ID: [name], female   DOB: November 26, 1965, 52 y.o.   MRN: [id number]

## 2017-08-05 NOTE — Plan of Care (Signed)
Pt. Alert to drowsy & follows commands.  VSS O/N, vent remains at 70%. Pt. Rested well on fentanyl without needing any sedation. UOP > 2 L. No care concerns at this time

## 2017-08-06 DIAGNOSIS — R739 Hyperglycemia, unspecified: Secondary | ICD-10-CM

## 2017-08-06 LAB — BASIC METABOLIC PANEL
Anion gap: 10 (ref 5–15)
BUN: 46 mg/dL — AB (ref 6–20)
CALCIUM: 9.8 mg/dL (ref 8.9–10.3)
CO2: 38 mmol/L — ABNORMAL HIGH (ref 22–32)
CREATININE: 0.66 mg/dL (ref 0.44–1.00)
Chloride: 93 mmol/L — ABNORMAL LOW (ref 101–111)
GFR calc non Af Amer: >60 mL/min (ref >60)
Glucose, Bld: 172 mg/dL — ABNORMAL HIGH (ref 65–99)
Potassium: 4.9 mmol/L (ref 3.5–5.1)
SODIUM: 141 mmol/L (ref 135–145)

## 2017-08-06 LAB — BLOOD GAS, ARTERIAL
ACID-BASE EXCESS: 18.3 mmol/L — AB (ref 0.0–2.0)
BICARBONATE: 45.9 mmol/L — AB (ref 20.0–28.0)
FIO2: 0.6
MECHVT: 450 mL
O2 SAT: 95.7 %
PCO2 ART: 63 mmHg — AB (ref 32.0–48.0)
PEEP/CPAP: 12 cmH2O
PH ART: 7.47 — AB (ref 7.350–7.450)
Patient temperature: 37
RATE: 22 resp/min
pO2, Arterial: 75 mmHg — ABNORMAL LOW (ref 83.0–108.0)

## 2017-08-06 LAB — GLUCOSE, CAPILLARY
GLUCOSE-CAPILLARY: 150 mg/dL — AB (ref 65–99)
GLUCOSE-CAPILLARY: 165 mg/dL — AB (ref 65–99)
Glucose-Capillary: 160 mg/dL — ABNORMAL HIGH (ref 65–99)
Glucose-Capillary: 162 mg/dL — ABNORMAL HIGH (ref 65–99)
Glucose-Capillary: 167 mg/dL — ABNORMAL HIGH (ref 65–99)
Glucose-Capillary: 181 mg/dL — ABNORMAL HIGH (ref 65–99)
Glucose-Capillary: 187 mg/dL — ABNORMAL HIGH (ref 65–99)

## 2017-08-06 LAB — CULTURE, RESPIRATORY W GRAM STAIN: Culture: NORMAL

## 2017-08-06 LAB — MAGNESIUM: Magnesium: 2.3 mg/dL (ref 1.7–2.4)

## 2017-08-06 LAB — CULTURE, RESPIRATORY

## 2017-08-06 LAB — PHOSPHORUS: PHOSPHORUS: 4.6 mg/dL (ref 2.5–4.6)

## 2017-08-06 LAB — TRIGLYCERIDES: Triglycerides: 149 mg/dL (ref <150)

## 2017-08-06 NOTE — Progress Notes (Signed)
[facility] - [place] at [facility]   PATIENT NAME: [name]    MR#:  [id number]  DATE OF BIRTH:  04/17/1966  SUBJECTIVE:   Intubated on the ventilator.  Patient has very difficult airway  SBAT today, daughter at bed side  Patient is difficult to wean.  Family members at bedside REVIEW OF SYSTEMS:   Review of Systems  Unable to perform ROS: Intubated   Tolerating Diet: Tube feedings  DRUG ALLERGIES:  No Known Allergies  VITALS:  Blood pressure 108/72, pulse 75, temperature 98.4 F (36.9 C), temperature source Axillary, resp. rate (!) 25, height 5' (1.524 m), weight 117.1 kg (258 lb 2.5 oz), SpO2 (!) 81 %.  PHYSICAL EXAMINATION:   Physical Exam  GENERAL:  52 y.o.-year-old patient critically ill lying in the bed with no acute distress.  Morbidly obese EYES: Pupils equal, round, reactive to light and accommodation. No scleral icterus. Extraocular muscles intact.  HEENT: Head atraumatic, normocephalic. Oropharynx and nasopharynx clear.  Tube dated on the ventilator NECK:  Supple, no jugular venous distention. No thyroid enlargement, no tenderness.  LUNGS: Mod breath sounds bilaterally, no wheezing, rales, rhonchi. No use of accessory muscles of respiration.  CARDIOVASCULAR: S1, S2 normal. No murmurs, rubs, or gallops.  ABDOMEN: Soft, nontender, nondistended. Bowel sounds present. No organomegaly or mass.  EXTREMITIES: No cyanosis, clubbing or edema b/l.    NEUROLOGIC: Intubated.   PSYCHIATRIC: Sedated on the vent SKIN: No obvious rash, lesion, or ulcer.   LABORATORY PANEL:  CBC Recent Labs  Lab 08/05/17 0532  WBC 9.9  HGB 13.6  HCT 44.3  PLT 375    Chemistries  Recent Labs  Lab 08/01/17 0444  08/06/17 0347  NA 140   < > 141  K 4.3   < > 4.9  CL 96*   < > 93*  CO2 33*   < > 38*  GLUCOSE 148*   < > 172*  BUN 22*   < > 46*  CREATININE 1.00   < > 0.66  CALCIUM 9.1   < > 9.8  MG 2.1   < > 2.3  AST 18  --   --   ALT 23  --   --    ALKPHOS 80  --   --   BILITOT 0.8  --   --    < > = values in this interval not displayed.   Cardiac Enzymes No results for input(s): TROPONINI in the last 168 hours. RADIOLOGY:  Dg Chest Port 1 View  Result Date: 08/05/2017 CLINICAL DATA:  Acute respiratory failure, COPD, hypertension, smoker EXAM: PORTABLE CHEST 1 VIEW COMPARISON:  Portable exam 0548 hours compared to 08/04/2017 FINDINGS: Tip of endotracheal tube projects 4.1 cm above carina. Nasogastric tube extends into stomach. LEFT subclavian central venous catheter tip projects over SVC. Numerous EKG leads project over chest. Enlargement of cardiac silhouette with pulmonary vascular congestion. Scattered interstitial infiltrates likely representing pulmonary edema. Decreased LEFT pleural effusion. LEFT lower lobe opacification could represent atelectasis or coexistent consolidation. No pneumothorax. IMPRESSION: Persistent pulmonary edema with decreased LEFT pleural effusion. LEFT lower lobe atelectasis versus consolidation. Electronically Signed   By: Ulyses Southward M.D.   On: 08/05/2017 08:11   ASSESSMENT AND PLAN:  52 yo female admitted for chest pain, pericardial effusion and acute on chronic hypoxic respiratory failure secondary to possible LLL pneumonia and acute CHF exacerbation.   * Acute on chronic respiratory failure with hypoxia Patient has a very difficult airway  Secondary to acute on chronic diastolic congestive heart failure along with pneumonia, atelectesis, effusion - seen by CXR with complete opacification of left hemithorax - seen by Dr Thelma Bargeaks - chest tube not needed - - s/[name] bronch - moderate mucoid secretions and no endobronchial tumors, masses or foreign bodies.  No specimens were obtained - vent mgmt per PCCM, SBAT today, reconsult ENT dr.Bennett when pt is ready to extubate   *Hyperkalemia Resolved  * acute on chronic diastolic congestive heart failure   IV Lasix frequency increased, monitor intake and output,    Cardiology input appreciated  * pericardial effusion and pleural effusion no pericardial tamponade   Likely secondary to CHF continue IV Lasix - no need of chest tube per Dr Thelma Bargeaks  * Hypotension with h/o HTN - HOLD all BP meds  *Nutrition continue tube feeding- vital  care discussed with intensivist Case discussed with Care Management/Social Worker. Management plans discussed with the patient, family and they are in agreement.  CODE STATUS: FUll  DVT Prophylaxis: lovenox  TOTAL TIME TAKING CARE OF THIS PATIENT: 29 minutes.  >50% time spent on counselling and coordination of care  POSSIBLE D/C IN ? DAYS, DEPENDING ON CLINICAL CONDITION.  Note: This dictation was prepared with Dragon dictation along with smaller phrase technology. Any transcriptional errors that result from this process are unintentional.  Ramonita LabAruna Asser Lucena M.D on 08/06/2017 at 5:13 PM  Between 7am to 6pm - Pager - [phone]  After 6pm go to [email or web address] - Social research officer, governmentpassword EPAS [facility]  [facility]  Office  [phone]  CC: Primary care physician; [name], MDPatient ID: Angel MeekerSonya D [name], female   DOB: 01-Jan-1966, 52 y.o.   MRN: [id number]

## 2017-08-06 NOTE — Plan of Care (Signed)
Pt.'s RASS -1 to 0, following commands & MAE but unable to complete CAM-ICU. VSS O/N, rested well on fentanyl gtt. UOP adequate, TF remain and ventilator settings remain the same. No other care concerns at this time.

## 2017-08-06 NOTE — Progress Notes (Signed)
Name: [name] MRN: [id number] DOB: 10/06/1965    ADMISSION DATE:  07/29/2017 CONSULTATION DATE: 07/30/2017  REFERRING MD : Dr. Sherryll Burger   CHIEF COMPLAINT:   BRIEF PATIENT DESCRIPTION:  41 F presented to ED 03/17 and admitted to hospitalist service with hypoxemic respiratory failure, atypical chest pain, pericardial effusion (no tamponade by Echo).  Transferred to SDU 03/18 with worsening hypoxemia and complete opacification of left hemithorax on CXR.  Initially supported with BiPAP.  Intubated AM 03/19.  Very difficult airway!!!  MAJOR EVENTS/TEST RESULTS: 03/17 admission as documented above 03/17 CTA chest: No PE.  Moderate pericardial effusion and pericardial enhancement suggesting acute pericarditis.  Left greater than right pleural effusions with LLL atelectasis versus infiltrate. 03/17 TTE: LVEF 85%.  Grade 1 diastolic dysfunction.  No evidence of cardiac tamponade 03/18 repeat TTE: LVEF 75%.  Grade 1 diastolic dysfunction.  Small anterior and posterior pericardial effusion.  Moderate left pleural effusion.  No evidence of tamponade.  Mildly dilated RA, moderately dilated RV 03/19 CT chest: Extensive consolidation of left lung with minimal aeration of LUL.  Moderate left pleural effusion.  Patchy airspace disease in right lung. 03/20 much improved aeration of left lung on chest x-ray.  Cuff leak test negative.  Patient remains on PEEP 10 cm H2O and FiO2 60%. 03/20 ENT consultation requested for possible early trach versus high risk extubation in OR when meets criteria 3/21 + F/C on WUA. Remains on PEEP 8, FiO2 60%. Sedation regimen changed from propofol to fentanyl + intermittent midaz 3/22 Episode of desaturation in the night, responded to Lasix, Steroids; Propofol added for sedation 3/25 Started on SBT  INDWELLING DEVICES:: ETT 03/19 >>  L [place] CVL 03/19 >>   MICRO DATA: MRSA PCR 03/19 >> NEG Urine 03/17 >> multiple species present suggesting contaminated specimen Resp 03/19  >>   ANTIMICROBIALS:  Vanc 03/17 >> 03/17 Cefepime 03/17 >>     SUBJECTIVE:  Intubated, sedated. RASS -2. + F/C on WUA. Synchronous with ventilator  VITAL SIGNS: Temp:  [98.2 F (36.8 C)-98.8 F (37.1 C)] 98.6 F (37 C) (03/25 1100) Pulse Rate:  [64-80] 67 (03/25 1100) Resp:  [10-23] 10 (03/25 1100) BP: (101-130)/(58-95) 130/85 (03/25 1100) SpO2:  [81 %-96 %] 95 % (03/25 1130) FiO2 (%):  [60 %] 60 % (03/25 1130)   HEMODYNAMICS:  No compromise   VENTILATOR SETTINGS: Vent Mode: PSV FiO2 (%):  [60 %] 60 % Set Rate:  [22 bmp] 22 bmp Vt Set:  [450 mL-4580 mL] 450 mL PEEP:  [12 cmH20] 12 cmH20 Pressure Support:  [18 cmH20] 18 cmH20 Plateau Pressure:  [18 cmH20-23 cmH20] 23 cmH20  INTAKE / OUTPUT: I/O last 3 completed shifts: In: 3148 [I.V.:660; Other:50; NG/GT:2038; IV Piggyback:400] Out: 6865 [Urine:6865]  PHYSICAL EXAMINATION: General: Intubated but awake and approriate Neuro: EOMI, PERRLA, moves all extremities, nods appropriately HEENT: NCAT, sclerae white Cardiovascular: Distant heart sounds, regular, no M Lungs: Few scattered rhonchi, no wheezes Abdomen: Markedly obese, soft, NT, + BS Extremities: Warm, 1+ pedal edema Skin: No lesions noted, warm and dry   LABS:  BMET Recent Labs  Lab 08/05/17 0532 08/05/17 0859 08/06/17 0347  NA 140 139 141  K 4.8 4.7 4.9  CL 95* 91* 93*  CO2 37* 37* 38*  BUN 44* 43* 46*  CREATININE 0.77 0.70 0.66  GLUCOSE 174* 198* 172*    Electrolytes Recent Labs  Lab 08/04/17 0630 08/05/17 0532 08/05/17 0859 08/06/17 0347  CALCIUM 9.2 9.2 9.5 9.8  MG 2.3  2.3  --  2.3  PHOS 3.1 3.3 3.6 4.6    CBC Recent Labs  Lab 08/02/17 0418 08/03/17 0354 08/05/17 0532  WBC 10.7 10.0 9.9  HGB 13.4 14.2 13.6  HCT 44.3 46.6 44.3  PLT 350 394 375    Coag's Recent Labs  Lab 07/31/17 0701  APTT 37*  INR 1.21    Sepsis Markers Recent Labs  Lab 08/01/17 0444 08/02/17 0418 08/03/17 1924  PROCALCITON <0.10 <0.10  <0.10    ABG Recent Labs  Lab 08/04/17 0500 08/04/17 1153 08/06/17 0347  PHART 7.33* 7.46* 7.47*  PCO2ART 90* 66* 63*  PO2ART 81* 115* 75*    Liver Enzymes Recent Labs  Lab 08/01/17 0444 08/05/17 0532 08/05/17 0859  AST 18  --   --   ALT 23  --   --   ALKPHOS 80  --   --   BILITOT 0.8  --   --   ALBUMIN 2.7* 2.9* 3.3*    Cardiac Enzymes No results for input(s): TROPONINI, PROBNP in the last 168 hours.  Glucose Recent Labs  Lab 08/05/17 1617 08/05/17 2021 08/06/17 0008 08/06/17 0334 08/06/17 0754 08/06/17 1148  GLUCAP 164* 147* 165* 181* 187* 167*    CXR: Increased bibasilar opacities - infiltrate/effusion/atelectasis    ASSESSMENT / PLAN:  PULMONARY A: Acute hypoxemic respiratory failure with ARDS, oxygenating better today Complete opacification of left hemithorax 03/19 due to atelectasis improved, effusion, likely pneumonia although procalcitonin is negative. Repeated tracheal culture is negative thus far Smoker with suspected COPD with exacerbation Probable OSA (based on daughter's history) Very difficult airway!!! P:   Cont vent support - settings reviewed and/or adjusted Cont vent bundle Continue nebulized steroids, bronchodilators, lasix ENT input appreciated.  Dr. Willeen CassBennett requests reconsultation when extubation is a consideration Will increase PEEP recruitment, keep in negative fluid balance; Continue Cefepime Will start SBT today  CARDIOVASCULAR A:  Small Pericardial effusion without evidence of tamponade Hemodynamically stable P:  Continue hemodynamic monitoring MAP goal >65 mmHg  RENAL A:   AKI, resolved P:   Monitor BMET intermittently Monitor I/Os Correct electrolytes as indicated  Continue Lasix to Q 24 hours ;  will monitor urinary output and keep in negative fluid balance  GASTROINTESTINAL A:   Severe obesity P:   SUP: Enteral famotidine Continue TF protocol- goal adjusted today  HEMATOLOGIC A:   Polycythemia on  admission P:  DVT px: SQ heparin Monitor CBC intermittently Transfuse per usual guidelines   INFECTIOUS A:   Probable pneumonia Possible acute pericarditis P:   Monitor temp, WBC count Micro and abx as above   ENDOCRINE A:   Mild hyperglycemia without documented history of diabetes Steroid-induced hyperglycemia P:   Moderate scale SSI, target Glucose monitoring/ management  NEUROLOGIC A:   ICU/ventilator associated discomfort P:   RASS goal: -2, -3 Revised PAD protocol- back on propofol infusion with fentanyl infusion + midozalam as needed   FAMILY: Daughter Valentina GuKemia Brown updated in person.  CCM time: 37 mins The above time includes time spent in consultation with patient and/or family members and reviewing care plan on multidisciplinary rounds   Jackson LatinoKarol Winifred Balogh, MD PCCM service Pager [phone] 08/06/2017, 2:34 PM

## 2017-08-06 NOTE — Progress Notes (Signed)
Nutrition Follow-up  DOCUMENTATION CODES:   Morbid obesity  INTERVENTION:  Continue Vital High Protein at 40 mL/hr (960 mL goal daily volume) + Pro-Stat 30 mL BID via NGT. Provides 1160 kcal, 114 grams of protein, 806 mL H2O daily.  Continue liquid MVI daily per tube.  With free water flush of 100 mL Q8hrs patient is receiving a total of 1106 mL H2O daily including water in tube feeding. Plan is for negative fluid balance at this time.  Resume daily weights (order must have been discontinued). This is important in monitoring fluid status and adequacy of nutrition prescription.  NUTRITION DIAGNOSIS:   Inadequate oral intake related to inability to eat as evidenced by NPO status.  Ongoing - addressing with TF regimen.  GOAL:   Provide needs based on ASPEN/SCCM guidelines  Met with TF regimen.  MONITOR:   Vent status, Labs, Weight trends, TF tolerance, I & O's  REASON FOR ASSESSMENT:   Ventilator, Consult Enteral/tube feeding initiation and management  ASSESSMENT:   52 year old female with PMHx of COPD, tobacco abuse, anxiety, essential HTN who was admitted with atypical chest pain, hypoxemic respiratory failure, and pericardial effusion and then transferred to SDU on 3/18 with worsening hypoxemia and complete opacification of left hemithorax suspected to be due to atelectasis initially requiring BiPAP but was intubated on AM of 3/19.  Patient off propofol and on fentanyl gtt at 175 mcg/hr. She was in pressure support mode. No family members at bedside at time of RD assessment. Abdomen soft on NFPE.  Access: NGT placed 3/19; terminates in distal gastric body per abdominal x-ray 3/19; 60 cm at left nare  MAP: 72-103 mmHg  TF: patient is still receiving Vital High Protein at 40 mL/hr + Pro-Stat 30 mL BID and tolerating well; it appears orders were not changed on 3/22  Patient is currently intubated on ventilator support MV: 9.9 L/min Temp (24hrs), Avg:98.6 F (37 C),  Min:98.2 F (36.8 C), Max:98.8 F (37.1 C)  Propofol: N/A  Medications reviewed and include: Colace, famotidine, free water flush 100 mL Q8hrs, Lasix 40 mg daily IV, Novolog 0-15 units Q4hrs (received 17 units past 24 hrs), Solu-Medrol 40 mg Q8hrs IV, liquid MVI daily per tube, Senokot-S, cefepime, fentanyl gtt.  Labs reviewed: CBG 147-191 past 24 hrs, Chloride 93, CO2 38, BUN 46.  I/O: 4755 mL UOP yesterday (1.7 mL/kg/hr)  No new weight to trend. Last weight was 117.1 kg from 3/21.  Discussed with RN and on rounds. Patient will be staying off of propofol. Plan is to keep patient in negative fluid balance.  Diet Order:  No diet orders on file  EDUCATION NEEDS:   No education needs have been identified at this time  Skin:  Skin Assessment: Reviewed RN Assessment  Last BM:  08/05/2017 - medium type 6  Height:   Ht Readings from Last 1 Encounters:  07/31/17 5' (1.524 m)    Weight:   Wt Readings from Last 1 Encounters:  08/02/17 258 lb 2.5 oz (117.1 kg)    Ideal Body Weight:  45.5 kg  BMI:  Body mass index is 50.42 kg/m.  Estimated Nutritional Needs:   Kcal:  1143-1334 kcal (60-70% of PSU 2003b w/ MSJ 1716, Ve 8.3, Tmax 37.2); 22-25 kcal/kg IBW is 1001-1138 kcal  Protein:  >/= 114 grams (>/= 2.5 grams/kg IBW  Fluid:  1.4-1.6 L/day (30-35 mL/kg IBW)  Willey Blade, MS, RD, LDN Office: [phone] Pager: [phone] After Hours/Weekend Pager: [phone]

## 2017-08-06 NOTE — Care Management Note (Signed)
Case Management Note  Patient Details  Name: [name] MRN: [id number] Date of Birth: August 16, 1965  Subjective/Objective:                  RNCM met with patient and her daughter [name]. Patient is responding but she is intubated. Patient lives with a female friend Mr. Kenton Kingfisher that is a hemodialysis patient. [name] has applied online for both of them for medicaid assistance and requesting information regarding disability- medical records. Patient is [facility] however she does not have health insurance.  She goes to [facility] for [facility].   Action/Plan:  RNCM has shared financial contacts for [facility], location of Medical records, and [facility]/Medication Management. I explained that Electrical engineer at dialysis center should be able to assist Mr. Kenton Kingfisher.  I have left message for Apolonio Schneiders with Medicaid office [facility] ext [phone] to see if she could pull patient's Medicaid application.     Expected Discharge Date:  07/31/17               Expected Discharge Plan:     In-House Referral:     Discharge [facility], Medication Assistance  Post Acute Care Choice:    Choice offered to:  Patient, Adult Children  DME Arranged:    DME Agency:     HH Arranged:    [place] Agency:     Status of Service:  In process, will continue to follow  If discussed at Long Length of Stay Meetings, dates discussed:    Additional Comments:  Marshell Garfinkel, RN 08/06/2017, 12:10 PM

## 2017-08-06 NOTE — Progress Notes (Signed)
SUBJECTIVE: Intubated but alert and responsive to questions and pointing to pictures. Denies chest pain.   Vitals:   08/06/17 0600 08/06/17 0700 08/06/17 0722 08/06/17 0724  BP: 114/70 105/61    Pulse: 76 67    Resp: (!) 22 (!) 22    Temp:    98.5 F (36.9 C)  TempSrc:    Oral  SpO2: 91% 94% 94% 94%  Weight:      Height:        Intake/Output Summary (Last 24 hours) at 08/06/2017 0903 Last data filed at 08/06/2017 0600 Gross per 24 hour  Intake 1695.5 ml  Output 4580 ml  Net -2884.5 ml    LABS: Basic Metabolic Panel: Recent Labs    08/05/17 0532 08/05/17 0859 08/06/17 0347  NA 140 139 141  K 4.8 4.7 4.9  CL 95* 91* 93*  CO2 37* 37* 38*  GLUCOSE 174* 198* 172*  BUN 44* 43* 46*  CREATININE 0.77 0.70 0.66  CALCIUM 9.2 9.5 9.8  MG 2.3  --  2.3  PHOS 3.3 3.6 4.6   Liver Function Tests: Recent Labs    08/05/17 0532 08/05/17 0859  ALBUMIN 2.9* 3.3*   No results for input(s): LIPASE, AMYLASE in the last 72 hours. CBC: Recent Labs    08/05/17 0532  WBC 9.9  HGB 13.6  HCT 44.3  MCV 78.9*  PLT 375   Cardiac Enzymes: No results for input(s): CKTOTAL, CKMB, CKMBINDEX, TROPONINI in the last 72 hours. BNP: Invalid input(s): POCBNP D-Dimer: No results for input(s): DDIMER in the last 72 hours. Hemoglobin A1C: No results for input(s): HGBA1C in the last 72 hours. Fasting Lipid Panel: Recent Labs    08/06/17 0347  TRIG 149   Thyroid Function Tests: No results for input(s): TSH, T4TOTAL, T3FREE, THYROIDAB in the last 72 hours.  Invalid input(s): FREET3 Anemia Panel: No results for input(s): VITAMINB12, FOLATE, FERRITIN, TIBC, IRON, RETICCTPCT in the last 72 hours.   PHYSICAL EXAM General: Intubated but alert. Generalized edema. HEENT:  Normocephalic and atramatic Neck:  No JVD.  Lungs: Clear bilaterally to auscultation and percussion. Heart: HRRR . Normal S1 and S2 without gallops or murmurs.  Msk:  Back normal, normal gait. Normal strength and tone  for age. Extremities: No clubbing, cyanosis or edema.   Neuro: Awake, responds to questions  TELEMETRY: NSR 80bpm  ASSESSMENT AND PLAN: Respiratory failure secondary to COPD, acute on chronic CHF, and small pericardial effusion without tamponade. Remains intubated but is alert this morning.Continue respiratory support and will continue to monitor cardiac status.      Principal Problem:   Acute on chronic respiratory failure with hypoxia (HCC) Active Problems:   Acute respiratory failure (HCC)   Pericardial effusion    Caroleen HammanKristin Krisa Blattner, NP-C 08/06/2017 9:03 AM Cell: [phone]

## 2017-08-06 NOTE — Progress Notes (Signed)
Alert most of day on fentanyl at 151mg/hr. Follows commands with generalized weakness. Difficult to read lips and letter board and writing is difficult due to weakness. Currently on fio2 60%/ 22 rate/ 12 peep/ TV450. This am, SBT  60% 18/12 tolerated for 3hrs. Lungs are diminished. Secretions white. Tolerated lying flat for bath and linen change. Afebrile on cefapime. UOP good with lasix. Tolerates TF . No stools this shift. Family met with care manager today.

## 2017-08-07 ENCOUNTER — Inpatient Hospital Stay: Payer: Medicaid Other

## 2017-08-07 DIAGNOSIS — K5901 Slow transit constipation: Secondary | ICD-10-CM

## 2017-08-07 DIAGNOSIS — J9622 Acute and chronic respiratory failure with hypercapnia: Secondary | ICD-10-CM

## 2017-08-07 LAB — GLUCOSE, CAPILLARY
GLUCOSE-CAPILLARY: 123 mg/dL — AB (ref 65–99)
Glucose-Capillary: 135 mg/dL — ABNORMAL HIGH (ref 65–99)
Glucose-Capillary: 157 mg/dL — ABNORMAL HIGH (ref 65–99)
Glucose-Capillary: 121 mg/dL — ABNORMAL HIGH (ref 65–99)
Glucose-Capillary: 146 mg/dL — ABNORMAL HIGH (ref 65–99)

## 2017-08-07 MED ORDER — CHLORHEXIDINE GLUCONATE 0.12 % MT SOLN
OROMUCOSAL | Status: AC
  Administered 2017-08-07: 15 mL via OROMUCOSAL
  Filled 2017-08-07: qty 15

## 2017-08-07 MED ORDER — MAGNESIUM CITRATE PO SOLN
1.0000 | Freq: Once | ORAL | Status: AC
  Administered 2017-08-07: 1 via ORAL
  Filled 2017-08-07 (×2): qty 296

## 2017-08-07 MED ORDER — METOCLOPRAMIDE HCL 5 MG/ML IJ SOLN
10.0000 mg | Freq: Four times a day (QID) | INTRAMUSCULAR | Status: AC
  Administered 2017-08-07 – 2017-08-09 (×8): 10 mg via INTRAVENOUS
  Filled 2017-08-07 (×8): qty 2

## 2017-08-07 MED ORDER — ONDANSETRON HCL 4 MG/2ML IJ SOLN
4.0000 mg | Freq: Three times a day (TID) | INTRAMUSCULAR | Status: DC | PRN
  Administered 2017-08-07 – 2017-08-14 (×3): 4 mg via INTRAVENOUS
  Filled 2017-08-07 (×3): qty 2

## 2017-08-07 NOTE — Progress Notes (Signed)
SUBJECTIVE: Remains intubated, stirs to noise but is resting currently.    Vitals:   08/07/17 0400 08/07/17 0500 08/07/17 0600 08/07/17 0800  BP: (!) 103/56 100/60 122/68 (!) 106/56  Pulse: 63 62 70 (!) 57  Resp: (!) 22 (!) 22 20 (!) 22  Temp: 98 F (36.7 C)   97.9 F (36.6 C)  TempSrc: Axillary   Oral  SpO2: 96% 96% 94% 94%  Weight:  240 lb 8.4 oz (109.1 kg)    Height:        Intake/Output Summary (Last 24 hours) at 08/07/2017 0905 Last data filed at 08/07/2017 0800 Gross per 24 hour  Intake 1382.5 ml  Output 3630 ml  Net -2247.5 ml    LABS: Basic Metabolic Panel: Recent Labs    08/05/17 0532 08/05/17 0859 08/06/17 0347  NA 140 139 141  K 4.8 4.7 4.9  CL 95* 91* 93*  CO2 37* 37* 38*  GLUCOSE 174* 198* 172*  BUN 44* 43* 46*  CREATININE 0.77 0.70 0.66  CALCIUM 9.2 9.5 9.8  MG 2.3  --  2.3  PHOS 3.3 3.6 4.6   Liver Function Tests: Recent Labs    08/05/17 0532 08/05/17 0859  ALBUMIN 2.9* 3.3*   No results for input(s): LIPASE, AMYLASE in the last 72 hours. CBC: Recent Labs    08/05/17 0532  WBC 9.9  HGB 13.6  HCT 44.3  MCV 78.9*  PLT 375   Cardiac Enzymes: No results for input(s): CKTOTAL, CKMB, CKMBINDEX, TROPONINI in the last 72 hours. BNP: Invalid input(s): POCBNP D-Dimer: No results for input(s): DDIMER in the last 72 hours. Hemoglobin A1C: No results for input(s): HGBA1C in the last 72 hours. Fasting Lipid Panel: Recent Labs    08/06/17 0347  TRIG 149   Thyroid Function Tests: No results for input(s): TSH, T4TOTAL, T3FREE, THYROIDAB in the last 72 hours.  Invalid input(s): FREET3 Anemia Panel: No results for input(s): VITAMINB12, FOLATE, FERRITIN, TIBC, IRON, RETICCTPCT in the last 72 hours.   PHYSICAL EXAM General: Generalized edema, intubated and lethargic HEENT:  Normocephalic and atramatic Neck:  No JVD.  Lungs: Clear bilaterally to auscultation and percussion. Heart: HRRR . Normal S1 and S2 without gallops or murmurs.   Abdomen: Bowel sounds are positive, abdomen soft and non-tender  Msk:  Back normal, normal gait. Normal strength and tone for age. Extremities: No clubbing, cyanosis or edema.   Neuro: sedated, lethargic   TELEMETRY: NSR 62bpm  ASSESSMENT AND PLAN: Respiratory failuresecondary to COPD, acute on chronic CHF, and smallpericardial effusion without tamponade. Awaiting weaning from ventilator. Heart rate WNL currently. Intubated but is stirs to her name. Continue respiratory support and will continue to monitor cardiac status.    Principal Problem:   Acute on chronic respiratory failure with hypoxia (HCC) Active Problems:   Acute respiratory failure (HCC)   Pericardial effusion    Caroleen HammanKristin Vennie Waymire, NP-C 08/07/2017 9:05 AM Cell: [phone]

## 2017-08-07 NOTE — Progress Notes (Signed)
Name: [name] MRN: [id number] DOB: 1965/05/30    ADMISSION DATE:  07/29/2017 CONSULTATION DATE: 07/30/2017  REFERRING MD : Dr. Sherryll BurgerShah   CHIEF COMPLAINT:   BRIEF PATIENT DESCRIPTION:  [id number] F presented to ED 03/17 and admitted to hospitalist service with hypoxemic respiratory failure, atypical chest pain, pericardial effusion (no tamponade by Echo).  Transferred to SDU 03/18 with worsening hypoxemia and complete opacification of left hemithorax on CXR.  Initially supported with BiPAP.  Intubated AM 03/19.  Very difficult airway!!!  MAJOR EVENTS/TEST RESULTS: 03/17 admission as documented above 03/17 CTA chest: No PE.  Moderate pericardial effusion and pericardial enhancement suggesting acute pericarditis.  Left greater than right pleural effusions with LLL atelectasis versus infiltrate. 03/17 TTE: LVEF 85%.  Grade 1 diastolic dysfunction.  No evidence of cardiac tamponade 03/18 repeat TTE: LVEF 75%.  Grade 1 diastolic dysfunction.  Small anterior and posterior pericardial effusion.  Moderate left pleural effusion.  No evidence of tamponade.  Mildly dilated RA, moderately dilated RV 03/19 CT chest: Extensive consolidation of left lung with minimal aeration of LUL.  Moderate left pleural effusion.  Patchy airspace disease in right lung. 03/20 much improved aeration of left lung on chest x-ray.  Cuff leak test negative.  Patient remains on PEEP 10 cm H2O and FiO2 60%. 03/20 ENT consultation requested for possible early trach versus high risk extubation in OR when meets criteria 3/21 + F/C on WUA. Remains on PEEP 8, FiO2 60%. Sedation regimen changed from propofol to fentanyl + intermittent midaz 3/22 Episode of desaturation in the night, responded to Lasix, Steroids; Propofol added for sedation 3/25 Started on SBT  INDWELLING DEVICES:: ETT 03/19 >>  L [place] CVL 03/19 >>   MICRO DATA: MRSA PCR 03/19 >> NEG Urine 03/17 >> multiple species present suggesting contaminated specimen Resp 03/19  >>   ANTIMICROBIALS:  Vanc 03/17 >> 03/17 Cefepime 03/17 >>     SUBJECTIVE:  Intubated, sedated. RASS -2. + F/C on WUA. Synchronous with ventilator  VITAL SIGNS: Temp:  [97.5 F (36.4 C)-98.4 F (36.9 C)] 97.9 F (36.6 C) (03/26 0800) Pulse Rate:  [57-75] 73 (03/26 1100) Resp:  [11-25] 21 (03/26 1100) BP: (92-131)/(55-85) 116/70 (03/26 1100) SpO2:  [81 %-97 %] 97 % (03/26 1132) FiO2 (%):  [50 %-60 %] 50 % (03/26 1232) Weight:  [240 lb 8.4 oz (109.1 kg)] 240 lb 8.4 oz (109.1 kg) (03/26 0500)   HEMODYNAMICS:  No compromise   VENTILATOR SETTINGS: Vent Mode: PRVC FiO2 (%):  [50 %-60 %] 50 % Set Rate:  [22 bmp] 22 bmp Vt Set:  [450 mL] 450 mL PEEP:  [12 cmH20] 12 cmH20 Pressure Support:  [15 cmH20-18 cmH20] 15 cmH20 Plateau Pressure:  [24 cmH20-27 cmH20] 27 cmH20  INTAKE / OUTPUT: I/O last 3 completed shifts: In: 2667.5 [I.V.:577.5; Other:50; NG/GT:1740; IV Piggyback:300] Out: 4780 [Urine:4330; Emesis/NG output:450]  PHYSICAL EXAMINATION: General: Intubated but awake and approriate Neuro: EOMI, PERRLA, moves all extremities, nods appropriately HEENT: NCAT, sclerae white Cardiovascular: Distant heart sounds, regular, no M Lungs: Few scattered rhonchi, no wheezes Abdomen: Markedly obese, soft, NT, + BS Extremities: Warm, 1+ pedal edema Skin: No lesions noted, warm and dry   LABS:  BMET Recent Labs  Lab 08/05/17 0532 08/05/17 0859 08/06/17 0347  NA 140 139 141  K 4.8 4.7 4.9  CL 95* 91* 93*  CO2 37* 37* 38*  BUN 44* 43* 46*  CREATININE 0.77 0.70 0.66  GLUCOSE 174* 198* 172*    Electrolytes Recent  Labs  Lab 08/04/17 0630 08/05/17 0532 08/05/17 0859 08/06/17 0347  CALCIUM 9.2 9.2 9.5 9.8  MG 2.3 2.3  --  2.3  PHOS 3.1 3.3 3.6 4.6    CBC Recent Labs  Lab 08/02/17 0418 08/03/17 0354 08/05/17 0532  WBC 10.7 10.0 9.9  HGB 13.4 14.2 13.6  HCT 44.3 46.6 44.3  PLT 350 394 375    Coag's No results for input(s): APTT, INR in the last 168  hours.  Sepsis Markers Recent Labs  Lab 08/01/17 0444 08/02/17 0418 08/03/17 1924  PROCALCITON <0.10 <0.10 <0.10    ABG Recent Labs  Lab 08/04/17 0500 08/04/17 1153 08/06/17 0347  PHART 7.33* 7.46* 7.47*  PCO2ART 90* 66* 63*  PO2ART 81* 115* 75*    Liver Enzymes Recent Labs  Lab 08/01/17 0444 08/05/17 0532 08/05/17 0859  AST 18  --   --   ALT 23  --   --   ALKPHOS 80  --   --   BILITOT 0.8  --   --   ALBUMIN 2.7* 2.9* 3.3*    Cardiac Enzymes No results for input(s): TROPONINI, PROBNP in the last 168 hours.  Glucose Recent Labs  Lab 08/06/17 1148 08/06/17 1542 08/06/17 2030 08/06/17 2351 08/07/17 0423 08/07/17 0736  GLUCAP 167* 150* 162* 160* 135* 123*    CXR: Increased bibasilar opacities - infiltrate/effusion/atelectasis    ASSESSMENT / PLAN:  PULMONARY A: Acute hypoxemic respiratory failure with ARDS, oxygenating better today Complete opacification of left hemithorax 03/19 due to atelectasis improved, effusion, likely pneumonia although procalcitonin is negative. Repeated tracheal culture is negative thus far Smoker with suspected COPD with exacerbation Probable OSA (based on daughter's history) Very difficult airway!!! P:   Cont vent support - settings reviewed and/or adjusted Cont vent bundle Continue nebulized steroids, bronchodilators, lasix ENT input appreciated.  Dr. Willeen Cass requests reconsultation when extubation is a consideration Will increase PEEP recruitment, keep in negative fluid balance; Continue Cefepime Continue SBT BID, Start decreasing PEEP once FIO2 gets to 50%  CARDIOVASCULAR A:  Small Pericardial effusion without evidence of tamponade Hemodynamically stable P:  Continue hemodynamic monitoring MAP goal >65 mmHg  RENAL A:   AKI, resolved P:   Monitor BMET intermittently Monitor I/Os Correct electrolytes as indicated  Continue Lasix to Q 24 hours ;  will monitor urinary output and keep in negative fluid  balance  GASTROINTESTINAL A:   Severe obesity ? constipation P:   SUP: Enteral famotidine Continue TF protocol-at goal, Mag Citrate for bowel motility  HEMATOLOGIC A:   Polycythemia on admission P:  DVT px: SQ heparin Monitor CBC intermittently Transfuse per usual guidelines   INFECTIOUS A:   Probable pneumonia Possible acute pericarditis P:   Monitor temp, WBC count Micro and abx as above   ENDOCRINE A:   Mild hyperglycemia without documented history of diabetes Steroid-induced hyperglycemia P:   Moderate scale SSI, target Glucose monitoring/ management  NEUROLOGIC A:   ICU/ventilator associated discomfort P:   RASS goal: -2, -3 Revised PAD protocol- back on propofol infusion with fentanyl infusion + midozalam as needed   FAMILY: Daughter Valentina Gu updated in person.  CCM time: 37 mins The above time includes time spent in consultation with patient and/or family members and reviewing care plan on multidisciplinary rounds   Jackson Latino, MD PCCM service Pager [phone] 08/07/2017, 12:55 PM

## 2017-08-07 NOTE — Progress Notes (Signed)
[facility] [place] [facility]   PATIENT NAME: [name]    MR#:  [id number]  DATE OF BIRTH:  January 30, 1966  SUBJECTIVE:   Intubated on the ventilator.  Patient has very difficult airway  SBAT, patient is awake and alert Patient is difficult to wean.  Family members at bedside REVIEW OF SYSTEMS:   Review of Systems  Unable to perform ROS: Intubated   Tolerating Diet: Tube feedings  DRUG ALLERGIES:  No Known Allergies  VITALS:  Blood pressure 112/67, pulse 64, temperature 98.3 F (36.8 C), temperature source Axillary, resp. rate (!) 22, height 5' (1.524 m), weight 109.1 kg (240 lb 8.4 oz), SpO2 95 %.  PHYSICAL EXAMINATION:   Physical Exam  GENERAL:  52 y.o.-year-old patient critically ill lying in the bed with no acute distress.  Morbidly obese EYES: Pupils equal, round, reactive to light and accommodation. No scleral icterus. Extraocular muscles intact.  HEENT: Head atraumatic, normocephalic. Oropharynx and nasopharynx clear.  on the ventilator NECK:  Supple, no jugular venous distention. No thyroid enlargement, no tenderness.  LUNGS: Mod breath sounds bilaterally, no wheezing, rales, rhonchi. No use of accessory muscles of respiration.  CARDIOVASCULAR: S1, S2 normal. No murmurs, rubs, or gallops.  ABDOMEN: Soft, nontender, nondistended. Bowel sounds present. No organomegaly or mass.  EXTREMITIES: No cyanosis, clubbing or edema b/l.    NEUROLOGIC: Intubated.   PSYCHIATRIC: Sedated on the vent SKIN: No obvious rash, lesion, or ulcer.   LABORATORY PANEL:  CBC Recent Labs  Lab 08/05/17 0532  WBC 9.9  HGB 13.6  HCT 44.3  PLT 375    Chemistries  Recent Labs  Lab 08/01/17 0444  08/06/17 0347  NA 140   < > 141  K 4.3   < > 4.9  CL 96*   < > 93*  CO2 33*   < > 38*  GLUCOSE 148*   < > 172*  BUN 22*   < > 46*  CREATININE 1.00   < > 0.66  CALCIUM 9.1   < > 9.8  MG 2.1   < > 2.3  AST 18  --   --   ALT 23  --   --   ALKPHOS 80  --    --   BILITOT 0.8  --   --    < > = values in this interval not displayed.   Cardiac Enzymes No results for input(s): TROPONINI in the last 168 hours. RADIOLOGY:  Dg Abd 1 View  Result Date: 08/07/2017 CLINICAL DATA:  Fecal impaction. EXAM: ABDOMEN - 1 VIEW COMPARISON:  07/31/2017 FINDINGS: Nasogastric tube has its tip in the midportion of the stomach. Clips in the right upper quadrant consistent with previous cholecystectomy. Single clip in the right lower quadrant, possibly appendectomy. Moderate amount of fecal matter within the right colon. No sign of obstruction. IMPRESSION: Nasogastric tube in the mid stomach. Moderate amount of fecal matter in the right colon. No sign of obstruction. Electronically Signed   By: Paulina Fusi M.D.   On: 08/07/2017 11:32   Dg Chest Port 1 View  Result Date: 08/07/2017 CLINICAL DATA:  Hypoxia EXAM: PORTABLE CHEST 1 VIEW COMPARISON:  August 05, 2017 FINDINGS: Endotracheal tube tip is 4.0 cm above the carina. Central catheter tip is in superior vena cava. Nasogastric tube tip and side port are below the diaphragm. No pneumothorax. There are small pleural effusions bilaterally. There is consolidation in the left lower lobe as well as to a lesser extent  in the right base. Heart is enlarged with pulmonary venous hypertension. No adenopathy. No bone lesions. IMPRESSION: Tube and catheter positions as described without pneumothorax. Bibasilar consolidation, more on the left than on the right, likely multifocal pneumonia. A degree of alveolar edema in the bases is possible. There is underlying pulmonary vascular congestion with small pleural effusions bilaterally. Electronically Signed   By: Bretta BangWilliam  Woodruff III M.D.   On: 08/07/2017 07:36   ASSESSMENT AND PLAN:  52 yo female admitted for chest pain, pericardial effusion and acute on chronic hypoxic respiratory failure secondary to possible LLL pneumonia and acute CHF exacerbation.   * Acute on chronic respiratory  failure with hypoxia Patient has a very difficult airway SBAT    Secondary to acute on chronic diastolic congestive heart failure along with pneumonia, atelectesis, effusion - seen by CXR with complete opacification of left hemithorax - seen by Dr Thelma Bargeaks - chest tube not needed - - s/[name] bronch - moderate mucoid secretions and no endobronchial tumors, masses or foreign bodies.  No specimens were obtained - vent mgmt per PCCM, SBAT as tolerated, reconsult ENT dr.Bennett when pt is ready to extubate -Currently patient is on FiO2 50%, PEEP 12, pressure support 15   *Hyperkalemia Resolved  * acute on chronic diastolic congestive heart failure   IV Lasix , monitor intake and output,   Cardiology input appreciated  * pericardial effusion and pleural effusion no pericardial tamponade   Likely secondary to CHF continue IV Lasix - no need of chest tube per Dr Thelma Bargeaks  * Hypotension with h/o HTN - HOLD all BP meds  *Nutrition continue tube feeding- vital  care discussed with intensivist Case discussed with Care Management/Social Worker. Management plans discussed with the patient, family and they are in agreement.  CODE STATUS: FUll  DVT Prophylaxis: lovenox  TOTAL TIME TAKING CARE OF THIS PATIENT: 29 minutes.  >50% time spent on counselling and coordination of care  POSSIBLE D/C IN ? DAYS, DEPENDING ON CLINICAL CONDITION.  Note: This dictation was prepared with Dragon dictation along with smaller phrase technology. Any transcriptional errors that result from this process are unintentional.  [name] M.D on 08/07/2017 at 2:17 PM  Between 7am to 6pm - Pager - [phone]  After 6pm go to [email or web address] - Social research officer, governmentpassword [email or web address] [email or web address]  [facility]  Office  [phone]  CC: Primary care physician; [name], MDPatient ID: Angel MeekerSonya D [name], female   DOB: December 28, 1965, 52 y.o.   MRN: [id number]

## 2017-08-07 NOTE — Progress Notes (Signed)
eLink Physician-Brief Progress Note Patient Name: Reinaldo MeekerSonya D Gadway DOB: July 27, 1965 MRN: [id number]   Date of Service  08/07/2017  HPI/Events of Note  Emesis - Patient vomited enteral feeds. No feeds suctioned from ETT and no change in O2 requirement.   eICU Interventions  Will order: 1. NGT to LIS. 2. Hold enteral nutrition. 3. Zofran 4 mg IV Q 6 hours PRN N/V. 4. Portable CXR in AM.     Intervention Category Major Interventions: Other:  Lenell AntuSommer,Antwion Carpenter Eugene 08/07/2017, 12:35 AM

## 2017-08-08 ENCOUNTER — Inpatient Hospital Stay: Payer: Medicaid Other

## 2017-08-08 DIAGNOSIS — E662 Morbid (severe) obesity with alveolar hypoventilation: Secondary | ICD-10-CM

## 2017-08-08 DIAGNOSIS — J90 Pleural effusion, not elsewhere classified: Secondary | ICD-10-CM

## 2017-08-08 LAB — CBC WITH DIFFERENTIAL/PLATELET
BASOS ABS: 0 10*3/uL (ref 0–0.1)
Basophils Relative: 0 %
EOS PCT: 0 %
Eosinophils Absolute: 0 10*3/uL (ref 0–0.7)
HCT: 48.3 % — ABNORMAL HIGH (ref 35.0–47.0)
Hemoglobin: 14.8 g/dL (ref 12.0–16.0)
Lymphocytes Relative: 9 %
Lymphs Abs: 1.2 10*3/uL (ref 1.0–3.6)
MCH: 23.7 pg — ABNORMAL LOW (ref 26.0–34.0)
MCHC: 30.6 g/dL — AB (ref 32.0–36.0)
MCV: 77.4 fL — AB (ref 80.0–100.0)
MONO ABS: 0.7 10*3/uL (ref 0.2–0.9)
MONOS PCT: 6 %
Neutro Abs: 10.5 10*3/uL — ABNORMAL HIGH (ref 1.4–6.5)
Neutrophils Relative %: 85 %
PLATELETS: 426 10*3/uL (ref 150–440)
RBC: 6.25 MIL/uL — ABNORMAL HIGH (ref 3.80–5.20)
RDW: 21.8 % — AB (ref 11.5–14.5)
WBC: 12.4 10*3/uL — ABNORMAL HIGH (ref 3.6–11.0)

## 2017-08-08 LAB — GLUCOSE, CAPILLARY
GLUCOSE-CAPILLARY: 110 mg/dL — AB (ref 65–99)
GLUCOSE-CAPILLARY: 154 mg/dL — AB (ref 65–99)
Glucose-Capillary: 151 mg/dL — ABNORMAL HIGH (ref 65–99)
Glucose-Capillary: 121 mg/dL — ABNORMAL HIGH (ref 65–99)
Glucose-Capillary: 145 mg/dL — ABNORMAL HIGH (ref 65–99)
Glucose-Capillary: 147 mg/dL — ABNORMAL HIGH (ref 65–99)
Glucose-Capillary: 159 mg/dL — ABNORMAL HIGH (ref 65–99)

## 2017-08-08 LAB — BASIC METABOLIC PANEL
ANION GAP: 11 (ref 5–15)
BUN: 54 mg/dL — AB (ref 6–20)
CALCIUM: 10.5 mg/dL — AB (ref 8.9–10.3)
CO2: 34 mmol/L — ABNORMAL HIGH (ref 22–32)
CREATININE: 0.73 mg/dL (ref 0.44–1.00)
Chloride: 93 mmol/L — ABNORMAL LOW (ref 101–111)
GFR calc Af Amer: >60 mL/min (ref >60)
GLUCOSE: 148 mg/dL — AB (ref 65–99)
Potassium: 5 mmol/L (ref 3.5–5.1)
Sodium: 138 mmol/L (ref 135–145)

## 2017-08-08 LAB — MAGNESIUM: Magnesium: 2.8 mg/dL — ABNORMAL HIGH (ref 1.7–2.4)

## 2017-08-08 LAB — PHOSPHORUS: Phosphorus: 4.1 mg/dL (ref 2.5–4.6)

## 2017-08-08 MED ORDER — SORBITOL 70 % SOLN
960.0000 mL | TOPICAL_OIL | Freq: Once | ORAL | Status: AC
  Administered 2017-08-08: 960 mL via RECTAL
  Filled 2017-08-08: qty 473

## 2017-08-08 MED ORDER — METHYLPREDNISOLONE SODIUM SUCC 40 MG IJ SOLR
40.0000 mg | Freq: Two times a day (BID) | INTRAMUSCULAR | Status: DC
  Administered 2017-08-08 – 2017-08-10 (×4): 40 mg via INTRAVENOUS
  Filled 2017-08-08 (×4): qty 1

## 2017-08-08 NOTE — Progress Notes (Signed)
Name: Angel MeekerSonya D [name] MRN: [id number] DOB: March 24, 1966    ADMISSION DATE:  07/29/2017 CONSULTATION DATE: 07/30/2017  REFERRING MD : Dr. Sherryll BurgerShah   CHIEF COMPLAINT:   BRIEF PATIENT DESCRIPTION:  [id number] F presented to ED 03/17 and admitted to hospitalist service with hypoxemic respiratory failure, atypical chest pain, pericardial effusion (no tamponade by Echo).  Transferred to SDU 03/18 with worsening hypoxemia and complete opacification of left hemithorax on CXR.  Initially supported with BiPAP.  Intubated AM 03/19.  Very difficult airway!!!  MAJOR EVENTS/TEST RESULTS: 03/17 admission as documented above 03/17 CTA chest: No PE.  Moderate pericardial effusion and pericardial enhancement suggesting acute pericarditis.  Left greater than right pleural effusions with LLL atelectasis versus infiltrate. 03/17 TTE: LVEF 85%.  Grade 1 diastolic dysfunction.  No evidence of cardiac tamponade 03/18 repeat TTE: LVEF 75%.  Grade 1 diastolic dysfunction.  Small anterior and posterior pericardial effusion.  Moderate left pleural effusion.  No evidence of tamponade.  Mildly dilated RA, moderately dilated RV 03/19 CT chest: Extensive consolidation of left lung with minimal aeration of LUL.  Moderate left pleural effusion.  Patchy airspace disease in right lung. 03/20 much improved aeration of left lung on chest x-ray.  Cuff leak test negative.  Patient remains on PEEP 10 cm H2O and FiO2 60%. 03/20 ENT consultation requested for possible early trach versus high risk extubation in OR when meets criteria 3/21 + F/C on WUA. Remains on PEEP 8, FiO2 60%. Sedation regimen changed from propofol to fentanyl + intermittent midaz 3/22 Episode of desaturation in the night, responded to Lasix, Steroids; Propofol added for sedation 3/25 Started on SBT  INDWELLING DEVICES:: ETT 03/19 >>  L [place] CVL 03/19 >>   MICRO DATA: MRSA PCR 03/19 >> NEG Urine 03/17 >> multiple species present suggesting contaminated specimen Resp 03/19  >>   ANTIMICROBIALS:  Vanc 03/17 >> 03/17 Cefepime 03/17 >>     SUBJECTIVE:  Intubated, sedated. RASS -2. + F/C on WUA. Synchronous with ventilator  VITAL SIGNS: Temp:  [97.7 F (36.5 C)-98.2 F (36.8 C)] 97.7 F (36.5 C) (03/27 1200) Pulse Rate:  [61-118] 118 (03/27 1200) Resp:  [14-24] 22 (03/27 1200) BP: (92-131)/(50-83) 121/74 (03/27 1200) SpO2:  [91 %-97 %] 93 % (03/27 1200) FiO2 (%):  [50 %] 50 % (03/27 1200) Weight:  [241 lb 10 oz (109.6 kg)] 241 lb 10 oz (109.6 kg) (03/27 0445)   HEMODYNAMICS:  No compromise   VENTILATOR SETTINGS: Vent Mode: PSV FiO2 (%):  [50 %] 50 % Set Rate:  [14 bmp-22 bmp] 14 bmp Vt Set:  [450 mL-500 mL] 500 mL PEEP:  [5 cmH20-12 cmH20] 8 cmH20 Pressure Support:  [10 cmH20-12 cmH20] 10 cmH20  INTAKE / OUTPUT: I/O last 3 completed shifts: In: 1363.5 [I.V.:647.5; NG/GT:716] Out: 5280 [Urine:3930; Emesis/NG output:1350]  PHYSICAL EXAMINATION: General: Intubated but awake and appropriate, tearful Neuro: EOMI, PERRLA, moves all extremities, follows all commands HEENT: NCAT, sclerae white Cardiovascular: Distant heart sounds, regular, no Murmurs Lungs: Few scattered rhonchi, no wheezes, oral secretions Abdomen: Markedly obese, soft, NT, + BS Extremities: Warm, 1+ pedal edema Skin: No lesions noted, warm and dry   LABS:  BMET Recent Labs  Lab 08/05/17 0859 08/06/17 0347 08/08/17 0435  NA 139 141 138  K 4.7 4.9 5.0  CL 91* 93* 93*  CO2 37* 38* 34*  BUN 43* 46* 54*  CREATININE 0.70 0.66 0.73  GLUCOSE 198* 172* 148*    Electrolytes Recent Labs  Lab 08/05/17 0532 08/05/17  1610 08/06/17 0347 08/08/17 0435  CALCIUM 9.2 9.5 9.8 10.5*  MG 2.3  --  2.3 2.8*  PHOS 3.3 3.6 4.6 4.1    CBC Recent Labs  Lab 08/03/17 0354 08/05/17 0532 08/08/17 0435  WBC 10.0 9.9 12.4*  HGB 14.2 13.6 14.8  HCT 46.6 44.3 48.3*  PLT 394 375 426    Coag's No results for input(s): APTT, INR in the last 168 hours.  Sepsis Markers Recent  Labs  Lab 08/02/17 0418 08/03/17 1924  PROCALCITON <0.10 <0.10    ABG Recent Labs  Lab 08/04/17 0500 08/04/17 1153 08/06/17 0347  PHART 7.33* 7.46* 7.47*  PCO2ART 90* 66* 63*  PO2ART 81* 115* 75*    Liver Enzymes Recent Labs  Lab 08/05/17 0532 08/05/17 0859  ALBUMIN 2.9* 3.3*    Cardiac Enzymes No results for input(s): TROPONINI, PROBNP in the last 168 hours.  Glucose Recent Labs  Lab 08/07/17 1619 08/07/17 1941 08/08/17 0009 08/08/17 0423 08/08/17 0745 08/08/17 1200  GLUCAP 121* 146* 154* 151* 121* 145*    CXR: Increased bibasilar opacities - infiltrate/effusion/atelectasis    ASSESSMENT / PLAN:  PULMONARY A: Acute hypoxemic respiratory failure with ARDS, oxygenating better today Pleural effusions, likely pneumonia although procalcitonin is negative. Repeated tracheal culture is negative thus far Smoker with suspected COPD with exacerbation Probable OSA (based on daughter's history) Very difficult airway!!! P:   Cont vent support - settings reviewed and/or adjusted Cont vent bundle Continue nebulized steroids, bronchodilators, lasix ENT input appreciated.  Dr. Willeen Cass requests reconsultation when extubation is a consideration Will increase PEEP recruitment, keep in negative fluid balance; Continue Cefepime Continue SBT BID, Doing well, no cuff leak heard today  CARDIOVASCULAR A:  Small Pericardial effusion without evidence of tamponade Hemodynamically stable P:  Continue hemodynamic monitoring MAP goal >65 mmHg  RENAL A:   AKI, resolved P:   Monitor BMET intermittently Monitor I/Os Correct electrolytes as indicated  D/C  Lasix today as BUN has trended > 50, may be from steroids as well ;  will monitor urinary output and keep in negative fluid balance  GASTROINTESTINAL A:   Severe obesity ? constipation P:   SUP: Enteral famotidine Will give enema today   HEMATOLOGIC A:   Polycythemia on admission P:  DVT px: SQ  heparin Monitor CBC intermittently Transfuse per usual guidelines   INFECTIOUS A:   Probable pneumonia Possible acute pericarditis P:   Monitor temp, WBC count Micro and abx as above   ENDOCRINE A:   Mild hyperglycemia without documented history of diabetes Steroid-induced hyperglycemia P:   Moderate scale SSI, target Glucose monitoring/ management  NEUROLOGIC A:   ICU/ventilator associated discomfort P:   RASS goal: -2, -3 Revised PAD protocol- back on propofol infusion with fentanyl infusion + midozalam as needed   FAMILY: Daughter Valentina Gu updated in person.  CCM time: 37 mins The above time includes time spent in consultation with patient and/or family members and reviewing care plan on multidisciplinary rounds   Jackson Latino, MD PCCM service Pager [phone] 08/08/2017, 12:44 PM

## 2017-08-08 NOTE — Progress Notes (Signed)
Pt on SBP almost entire shift, 50% FIO2, 10/5 PEEP, Flexi seal inserted and about 460 cc SMOG administered, further administration would have been very difficult, resistance met.  Pt later expelled flexi seal with ballon inflated w/o any reported pain.  Medium amount of stool on bed pad.  Pt reports stomach does feel better now.  Bowel sounds are more audible.  Pt is very happy lying on her right side and requests to stay there for a while.  She is at times doing her own oral care and is able to make her wishes known.

## 2017-08-08 NOTE — Progress Notes (Signed)
[place] at [facility]                                                                                                                                                                                  Patient Demographics   [name], is a 52 y.o. female, DOB - Feb 10, 1966, WIO:[id number]  Admit date - 07/29/2017   Admitting Physician Vaughan Basta, MD  Outpatient Primary MD for the patient is [name], [name], MD   LOS - 10  Subjective: Patient seen and evaluated today patient on ventilator Spontaneous breathing trials Patient is awake responds to verbal commands by blinking eyes  Review of Systems:   Could not be obtained as patient is on ventilator  Vitals:   Vitals:   08/08/17 1100 08/08/17 1200 08/08/17 1300 08/08/17 1400  BP: 131/83 121/74 127/77 112/67  Pulse: (!) 101 (!) 118 (!) 120 89  Resp: 15 (!) 22 (!) 27 18  Temp:  97.7 F (36.5 C)    TempSrc:  Oral    SpO2: 94% 93% 95% 95%  Weight:      Height:        Wt Readings from Last 3 Encounters:  08/08/17 109.6 kg (241 lb 10 oz)  06/12/17 119 kg (262 lb 6.4 oz)  09/29/16 117.9 kg (260 lb)     Intake/Output Summary (Last 24 hours) at 08/08/2017 1640 Last data filed at 08/08/2017 1200 Gross per 24 hour  Intake 256.92 ml  Output 2525 ml  Net -2268.08 ml    Physical Exam:   GENERAL: 52 year old female patient lying on the bed on ventilator FiO2 50% PEEP 8 pressure support 10 HEAD, EYES, EARS, NOSE AND THROAT: Atraumatic, normocephalic. Extraocular muscles are intact. Pupils equal and reactive to light. Sclerae anicteric. No conjunctival injection. ET tube noted NECK: Supple. There is no jugular venous distention. No bruits, no lymphadenopathy, no thyromegaly.  HEART: Regular rate and rhythm,. No murmurs, no rubs, no clicks.  LUNGS: Decreased air flow left lung, adequate air flow right lung.  Rales heard in left lung ABDOMEN: Soft, flat, nontender, nondistended. Has  good bowel sounds. No hepatosplenomegaly appreciated.  EXTREMITIES: No evidence of any cyanosis, clubbing, or peripheral edema.  +2 pedal and radial pulses bilaterally.  NEUROLOGIC: The patient is alert, awake  On ventilator SKIN: Moist and warm with no rashes appreciated.  Psych: could not be assessed LN: No inguinal LN enlargement    Antibiotics   Anti-infectives (From admission, onward)   Start     Dose/Rate Route Frequency Ordered Stop   07/31/17 0800  ceFEPIme (MAXIPIME) 2 g in sodium chloride 0.9 % 100 mL IVPB  2 g 200 mL/hr over 30 Minutes Intravenous Every 8 hours 07/31/17 0627 08/07/17 0030   07/29/17 1915  ceFEPIme (MAXIPIME) 2 g in sodium chloride 0.9 % 100 mL IVPB     2 g 200 mL/hr over 30 Minutes Intravenous  Once 07/29/17 1911 07/29/17 2050   07/29/17 1915  vancomycin (VANCOCIN) IVPB 1000 mg/200 mL premix     1,000 mg 200 mL/hr over 60 Minutes Intravenous  Once 07/29/17 1911 07/29/17 2342      Medications   Scheduled Meds: . budesonide (PULMICORT) nebulizer solution  0.5 mg Nebulization BID  . chlorhexidine gluconate (MEDLINE KIT)  15 mL Mouth Rinse BID  . docusate  100 mg Per Tube BID  . famotidine  20 mg Per Tube BID  . feeding supplement (PRO-STAT SUGAR FREE 64)  30 mL Per Tube BID  . feeding supplement (VITAL HIGH PROTEIN)  1,000 mL Per Tube Q24H  . free water  100 mL Per Tube Q8H  . heparin  5,000 Units Subcutaneous Q8H  . insulin aspart  0-15 Units Subcutaneous Q4H  . ipratropium-albuterol  3 mL Nebulization Q4H  . mouth rinse  15 mL Mouth Rinse 10 times per day  . methylPREDNISolone (SOLU-MEDROL) injection  40 mg Intravenous Q12H  . metoCLOPramide (REGLAN) injection  10 mg Intravenous Q6H  . multivitamin  15 mL Per Tube Daily  . senna-docusate  1 tablet Oral BID  . sodium chloride flush  10-40 mL Intracatheter Q12H   Continuous Infusions: . fentaNYL infusion INTRAVENOUS Stopped (08/08/17 0820)   PRN Meds:.albuterol, bisacodyl, fentaNYL,  ondansetron (ZOFRAN) IV, sodium chloride flush   Data Review:   Micro Results Recent Results (from the past 240 hour(s))  Urine culture     Status: Abnormal   Collection Time: 07/29/17 11:30 PM  Result Value Ref Range Status   Specimen Description   Final    URINE, RANDOM Performed at [facility], [address], [place], [place] [address]    Special Requests   Final    NONE Performed at [facility], [address] [place], [place] [address]    Culture MULTIPLE SPECIES PRESENT, SUGGEST RECOLLECTION (A)  Final   Report Status 07/31/2017 FINAL  Final  MRSA PCR Screening     Status: None   Collection Time: 07/31/17  1:29 AM  Result Value Ref Range Status   MRSA by PCR NEGATIVE NEGATIVE Final    Comment:        The GeneXpert MRSA Assay (FDA approved for NASAL specimens only), is one component of a comprehensive MRSA colonization surveillance program. It is not intended to diagnose MRSA infection nor to guide or monitor treatment for MRSA infections. Performed at [facility], [address] [place], [place] [address]   Culture, respiratory (NON-Expectorated)     Status: None   Collection Time: 07/31/17  2:50 PM  Result Value Ref Range Status   Specimen Description   Final    TRACHEAL ASPIRATE Performed at [facility], [address] [place], [place] [address]    Special Requests   Final    NONE Performed at [facility], [address] [place], [place] [address]    Gram Stain   Final    RARE WBC PRESENT, PREDOMINANTLY PMN RARE GRAM POSITIVE COCCI RARE GRAM POSITIVE RODS    Culture   Final    Consistent with normal respiratory flora. Performed at [facility], [place] [address], [place], [place] [address]  Report Status 08/03/2017 FINAL  Final  Culture, respiratory (NON-Expectorated)     Status: None   Collection Time: 08/04/17 11:53 AM  Result Value Ref Range Status   Specimen Description   Final     TRACHEAL ASPIRATE Performed at [facility], [address], [place], [place] [address]    Special Requests   Final    NONE Performed at [facility], [place], [place] [address]    Gram Stain   Final    RARE WBC PRESENT,BOTH PMN AND MONONUCLEAR RARE SQUAMOUS EPITHELIAL CELLS PRESENT RARE GRAM POSITIVE COCCI    Culture   Final    Consistent with normal respiratory flora. Performed at [facility], [place] [address], [place], [place] [address]    Report Status 08/06/2017 FINAL  Final    Radiology Reports Dg Chest 2 View  Result Date: 07/30/2017 CLINICAL DATA:  Shortness of breath. Acute on chronic respiratory failure. Hypoxia. Morbid obesity. EXAM: CHEST - 2 VIEW COMPARISON:  Chest x-ray and CT chest 07/29/2017. FINDINGS: Cardiac silhouette is partially silhouetted by large pleural effusion on the LEFT. There is a whiteout of the LEFT hemithorax, which could represent mucous plugging and or increasing effusion. The RIGHT lung demonstrates interstitial edema with small effusion. IMPRESSION: Marked worsening aeration. Complete opacity of the LEFT hemithorax represents a significant change from yesterday's radiograph. Electronically Signed   By: Staci Righter M.D.   On: 07/30/2017 08:45   Dg Chest 2 View  Result Date: 07/29/2017 CLINICAL DATA:  52 year old female with history of chest pain since last week. Pain most severe during deep breathing. EXAM: CHEST - 2 VIEW COMPARISON:  Chest x-ray 06/10/2017. FINDINGS: Left lower lobe airspace consolidation concerning for pneumonia. Small bilateral pleural effusions. Mild diffuse peribronchial cuffing. Mild cardiomegaly. Upper mediastinal contours appear widened, but are stable compared to prior examinations. IMPRESSION: 1. Left lower lobe pneumonia with small bilateral pleural effusions. Followup PA and lateral chest X-ray is recommended in 3-4 weeks following trial of antibiotic therapy to ensure resolution and  exclude underlying malignancy. 2. Mild cardiomegaly. Electronically Signed   By: Vinnie Langton M.D.   On: 07/29/2017 17:37   Dg Abd 1 View  Result Date: 08/07/2017 CLINICAL DATA:  Fecal impaction. EXAM: ABDOMEN - 1 VIEW COMPARISON:  07/31/2017 FINDINGS: Nasogastric tube has its tip in the midportion of the stomach. Clips in the right upper quadrant consistent with previous cholecystectomy. Single clip in the right lower quadrant, possibly appendectomy. Moderate amount of fecal matter within the right colon. No sign of obstruction. IMPRESSION: Nasogastric tube in the mid stomach. Moderate amount of fecal matter in the right colon. No sign of obstruction. Electronically Signed   By: Nelson Chimes M.D.   On: 08/07/2017 11:32   Ct Chest Wo Contrast  Result Date: 07/31/2017 CLINICAL DATA:  She presented to [facility] ER on 03/17 with c/o intermittent chest pain worse during inspiration and nonproductive cough onset 1 week prior to presentation. On 03/18 pt developed hypotension and worsening acute hypoxic respiratory failure. EXAM: CT CHEST WITHOUT CONTRAST TECHNIQUE: Multidetector CT imaging of the chest was performed following the standard protocol without IV contrast. COMPARISON:  07/29/2017, 06/10/2017 FINDINGS: Cardiovascular: Stable cardiomegaly. Small pericardial effusion. Dilatation of main pulmonary artery as well as the right and left main pulmonary arteries as can be seen with pulmonary arterial hypertension. Thoracic aorta is normal in caliber. Mediastinum/Nodes: Enlarged prevascular lymph node measuring 17 mm in short axis. Multiple other smaller mediastinal lymph nodes  are noted. Thyroid gland, trachea, and esophagus demonstrate no significant findings. Lungs/Pleura: Trace right pleural effusion. Small left pleural effusion. Left lower lobe airspace disease with air bronchograms concerning for pneumonia. Improved aeration of left upper lobe. Mild persistent right upper lobe airspace disease likely  reflecting atelectasis. Stable 6 mm right middle lobe pulmonary nodule unchanged compared with 09/01/2013. Upper Abdomen: Aortic atherosclerosis. Musculoskeletal: No acute osseous abnormality. No aggressive osseous lesion. IMPRESSION: 1. Left lower lobe airspace disease with air bronchograms concerning for combination of pneumonia and atelectasis. Mild mediastinal lymphadenopathy likely reactive. 2. Small left pleural effusion.  Mild right upper lobe atelectasis. 3.  Aortic Atherosclerosis (ICD10-I70.0). Electronically Signed   By: Kathreen Devoid   On: 07/31/2017 12:47   Ct Angio Chest Pe W And/or Wo Contrast  Result Date: 07/29/2017 CLINICAL DATA:  52 year old female with history of chest pain since last week. Difficulty taking deep breaths. EXAM: CT ANGIOGRAPHY CHEST WITH CONTRAST TECHNIQUE: Multidetector CT imaging of the chest was performed using the standard protocol during bolus administration of intravenous contrast. Multiplanar CT image reconstructions and MIPs were obtained to evaluate the vascular anatomy. CONTRAST:  51m ISOVUE-370 IOPAMIDOL (ISOVUE-370) INJECTION 76% COMPARISON:  Chest CT 06/10/2017. FINDINGS: Cardiovascular: No filling defects within the pulmonary arterial tree to suggest underlying pulmonary embolism. Heart size is normal. Moderate volume of pericardial fluid with pericardial enhancement concerning for pericarditis. No pericardial calcification. There is aortic atherosclerosis, as well as atherosclerosis of the great vessels of the mediastinum and the coronary arteries, including calcified atherosclerotic plaque in the left anterior descending coronary artery. Mediastinum/Nodes: Multiple prominent borderline enlarged and enlarged mediastinal lymph nodes, largest of which is in the prevascular nodal station measuring up to 22 mm in short axis, increased compared to the prior study. Esophagus is unremarkable in appearance. No axillary lymphadenopathy. Lungs/Pleura: Moderate left  pleural effusion with near complete passive atelectasis of the left lower lobe. Some dependent subsegmental atelectasis is also noted in the left upper lobe. Trace right pleural effusion. Throughout the remaining portions of the lungs there is a background of ground-glass attenuation and mild interlobular septal thickening, indicative of a background of mild interstitial pulmonary edema. 5 mm right middle lobe pulmonary nodule (axial image 47 of series 7), stable dating back to at least 09/01/2013, considered definitively benign. Upper Abdomen: Aortic atherosclerosis. Musculoskeletal: There are no aggressive appearing lytic or blastic lesions noted in the visualized portions of the skeleton. Review of the MIP images confirms the above findings. IMPRESSION: 1. No evidence of pulmonary embolism. 2. Extensive pericardial enhancement with moderate volume of pericardial fluid, indicative of an acute pericarditis. 3. This is associated with increasing mediastinal lymphadenopathy compared to the prior study. 4. In addition, there is evidence of mild pulmonary edema and bilateral pleural effusions (left greater than right) with extensive areas of passive atelectasis in the left lung. 5. Aortic atherosclerosis, in addition to left anterior descending coronary artery disease. Please note that although the presence of coronary artery calcium documents the presence of coronary artery disease, the severity of this disease and any potential stenosis cannot be assessed on this non-gated CT examination. Assessment for potential risk factor modification, dietary therapy or pharmacologic therapy may be warranted, if clinically indicated. Aortic Atherosclerosis (ICD10-I70.0). Electronically Signed   By: DVinnie LangtonM.D.   On: 07/29/2017 19:45   Dg Chest Port 1 View  Result Date: 08/08/2017 CLINICAL DATA:  Acute respiratory distress. History of acute on chronic respiratory failure, pericardial effusion, COPD, current smoker.  EXAM: PORTABLE CHEST  1 VIEW COMPARISON:  Portable chest x-ray of August 07, 2017 FINDINGS: The lungs are well-expanded. Persistent bibasilar interstitial and airspace opacities are present. The cardiac silhouette is enlarged. The pulmonary vascularity is mildly congested. The endotracheal tube tip lies 3.3 cm above the carina. The esophagogastric tube tip projects below the inferior margin of the image. The left internal jugular venous catheter tip projects over the junction of the proximal and midportions of the SVC. IMPRESSION: Bibasilar atelectasis or pneumonia. Mild CHF. The support tubes are in reasonable position. Electronically Signed   By: David  Martinique M.D.   On: 08/08/2017 09:39   Dg Chest Port 1 View  Result Date: 08/07/2017 CLINICAL DATA:  Hypoxia EXAM: PORTABLE CHEST 1 VIEW COMPARISON:  August 05, 2017 FINDINGS: Endotracheal tube tip is 4.0 cm above the carina. Central catheter tip is in superior vena cava. Nasogastric tube tip and side port are below the diaphragm. No pneumothorax. There are small pleural effusions bilaterally. There is consolidation in the left lower lobe as well as to a lesser extent in the right base. Heart is enlarged with pulmonary venous hypertension. No adenopathy. No bone lesions. IMPRESSION: Tube and catheter positions as described without pneumothorax. Bibasilar consolidation, more on the left than on the right, likely multifocal pneumonia. A degree of alveolar edema in the bases is possible. There is underlying pulmonary vascular congestion with small pleural effusions bilaterally. Electronically Signed   By: Lowella Grip III M.D.   On: 08/07/2017 07:36   Dg Chest Port 1 View  Result Date: 08/05/2017 CLINICAL DATA:  Acute respiratory failure, COPD, hypertension, smoker EXAM: PORTABLE CHEST 1 VIEW COMPARISON:  Portable exam 0548 hours compared to 08/04/2017 FINDINGS: Tip of endotracheal tube projects 4.1 cm above carina. Nasogastric tube extends into stomach. LEFT  subclavian central venous catheter tip projects over SVC. Numerous EKG leads project over chest. Enlargement of cardiac silhouette with pulmonary vascular congestion. Scattered interstitial infiltrates likely representing pulmonary edema. Decreased LEFT pleural effusion. LEFT lower lobe opacification could represent atelectasis or coexistent consolidation. No pneumothorax. IMPRESSION: Persistent pulmonary edema with decreased LEFT pleural effusion. LEFT lower lobe atelectasis versus consolidation. Electronically Signed   By: Lavonia Dana M.D.   On: 08/05/2017 08:11   Dg Chest Port 1 View  Result Date: 08/04/2017 CLINICAL DATA:  Respiratory failure. EXAM: PORTABLE CHEST 1 VIEW COMPARISON:  Yesterday. FINDINGS: Endotracheal tube in satisfactory position. Nasogastric tube extending the stomach. Stable left subclavian catheter. Increased patchy opacity in the perihilar regions bilaterally. No significant change in patchy density interstitial prominence elsewhere in the right lung. Increased left pleural fluid. Stable enlarged cardiac silhouette. No acute bony abnormality. IMPRESSION: 1. Mild worsening of changes of pulmonary edema. 2. Increased left pleural fluid. 3. Stable cardiomegaly. Electronically Signed   By: Claudie Revering M.D.   On: 08/04/2017 07:05   Dg Chest Port 1 View  Result Date: 08/03/2017 CLINICAL DATA:  Acute respiratory failure. EXAM: PORTABLE CHEST 1 VIEW COMPARISON:  Radiograph yesterday at 0456 hour.  Chest CT 07/31/2016 FINDINGS: Endotracheal tube tip at the thoracic inlet. Enteric tube in place, tip below the diaphragm. Left central line with tip in the proximal SVC. Unchanged cardiomegaly. Unchanged vascular congestion/pulmonary edema. Bibasilar opacities, left greater than right, unchanged. No pneumothorax. IMPRESSION: Unchanged appearance of the chest with cardiomegaly, pulmonary edema/vascular congestion, and bibasilar opacities, left greater than right. Electronically Signed   By:  Jeb Levering M.D.   On: 08/03/2017 01:48   Dg Chest Port 1 View  Result Date: 08/02/2017 CLINICAL  DATA:  52 year old female with respiratory failure. Left lung pneumonia. EXAM: PORTABLE CHEST 1 VIEW COMPARISON:  08/01/2017, chest CT 07/31/2017, and earlier. FINDINGS: Portable AP semi upright view at 0456 hours. Endotracheal tube tip at the level the clavicles. Enteric tube courses to the abdomen, tip not included. Stable left IJ or subclavian central line. Substantially improved left lung ventilation since 07/31/2017, although residual moderate confluent left lung base opacity remains. Interval increasing opacity at the right lung base. No pneumothorax. Chronic but increased underlying bilateral pulmonary interstitial opacity compared to 2018, possibly mild vascular congestion. Small bilateral pleural effusions are suspected. Stable cardiac size and mediastinal contours. Cardiomegaly and pericardial effusion noted on chest CT and CTA this month. Paucity of bowel gas in the upper abdomen. IMPRESSION: 1.  Stable lines and tubes. 2. Improved left lung multilobar pneumonia since 07/31/2017, but moderate residual at the lung base. 3. Worsening ventilation at the right lung base over that same time suspicious for right lung involvement now. 4. Stable cardiomegaly.  Mild vascular congestion/pulmonary edema. 5. Small bilateral pleural effusions suspected. Electronically Signed   By: Genevie Ann M.D.   On: 08/02/2017 11:00   Dg Chest Port 1 View  Result Date: 08/01/2017 CLINICAL DATA:  Respiratory failure EXAM: PORTABLE CHEST 1 VIEW COMPARISON:  Chest radiograph and chest CT July 31, 2017 FINDINGS: Endotracheal tube tip is 3.7 cm above the carina. Central catheter tip is in the superior vena cava. No pneumothorax. There has been significant partial clearing on the left compared to 1 day prior. There has been the significant clearing of diffuse consolidation and effusion. There remains atelectatic change in the left  base as well as small left effusion. There has been a clearing of consolidation from the right upper lobe. Currently the right lung is clear except for atelectasis in the left base. There is cardiomegaly with pulmonary venous hypertension. No adenopathy evident. No bone lesions. IMPRESSION: Tube and catheter positions as described without pneumothorax. Significant clearing at the throughout the left lung and right upper lobe. There remains atelectatic change in each lower lung zone as well as underlying pulmonary vascular congestion. There is felt to be small left residual pleural effusion. Electronically Signed   By: Lowella Grip III M.D.   On: 08/01/2017 07:56   Dg Chest Port 1 View  Result Date: 07/31/2017 CLINICAL DATA:  Intubation and central line placement. EXAM: PORTABLE CHEST 1 VIEW COMPARISON:  Chest x-ray from same day at 0529. FINDINGS: Interval placement of an endotracheal tube with the tip approximately 1 cm above the level of the carina. Enteric tube seen entering the stomach. Left subclavian central venous catheter with the tip at the cavoatrial junction. Stable cardiomegaly. Unchanged complete opacification of the left hemithorax due to large pleural effusion and left lung collapse. Increasing airspace disease within the right upper lobe. No acute osseous abnormality. IMPRESSION: 1. Interval placement of an endotracheal tube with the tip approximately 1 cm above the level of the carina. Recommend retraction 2-3 cm. 2. Appropriately positioned left subclavian central venous catheter. 3. Unchanged large left pleural effusion and left lung collapse. 4. Worsening airspace disease in the right upper lobe which could reflect edema or infection. Electronically Signed   By: Titus Dubin M.D.   On: 07/31/2017 10:51   Dg Chest Port 1 View  Result Date: 07/31/2017 CLINICAL DATA:  Acute respiratory failure EXAM: PORTABLE CHEST 1 VIEW COMPARISON:  07/30/2017 FINDINGS: Dense consolidation of the  left hemithorax unchanged due to effusion and collapse. Progression  of right lower lobe atelectasis/infiltrate. Diffuse airspace disease on the right may represent edema. IMPRESSION: Persistent collapse of the left lung Progressive right lower lobe atelectasis/infiltrate and small right effusion. Electronically Signed   By: Franchot Gallo M.D.   On: 07/31/2017 07:26   Dg Abd Portable 1v  Result Date: 07/31/2017 CLINICAL DATA:  NG tube placement. EXAM: PORTABLE ABDOMEN - 1 VIEW COMPARISON:  None. FINDINGS: Enteric tube in place with the tip in the distal gastric body. Nonobstructive bowel gas pattern. No acute osseous abnormality. IMPRESSION: Enteric tube appropriately positioned with the tip in the distal gastric body. Electronically Signed   By: Titus Dubin M.D.   On: 07/31/2017 10:52     CBC Recent Labs  Lab 08/02/17 0418 08/03/17 0354 08/05/17 0532 08/08/17 0435  WBC 10.7 10.0 9.9 12.4*  HGB 13.4 14.2 13.6 14.8  HCT 44.3 46.6 44.3 48.3*  PLT 350 394 375 426  MCV 79.4* 79.5* 78.9* 77.4*  MCH 24.1* 24.3* 24.3* 23.7*  MCHC 30.3* 30.5* 30.8* 30.6*  RDW 21.1* 21.4* 21.0* 21.8*  LYMPHSABS  --   --   --  1.2  MONOABS  --   --   --  0.7  EOSABS  --   --   --  0.0  BASOSABS  --   --   --  0.0    Chemistries  Recent Labs  Lab 08/04/17 0630 08/04/17 1013 08/05/17 0532 08/05/17 0859 08/06/17 0347 08/08/17 0435  NA 141  --  140 139 141 138  K 5.4* 5.0 4.8 4.7 4.9 5.0  CL 98*  --  95* 91* 93* 93*  CO2 37*  --  37* 37* 38* 34*  GLUCOSE 151*  --  174* 198* 172* 148*  BUN 36*  --  44* 43* 46* 54*  CREATININE 0.68  --  0.77 0.70 0.66 0.73  CALCIUM 9.2  --  9.2 9.5 9.8 10.5*  MG 2.3  --  2.3  --  2.3 2.8*   ------------------------------------------------------------------------------------------------------------------ estimated creatinine clearance is 91.3 mL/min (by C-G formula based on SCr of 0.73  mg/dL). ------------------------------------------------------------------------------------------------------------------ No results for input(s): HGBA1C in the last 72 hours. ------------------------------------------------------------------------------------------------------------------ Recent Labs    08/06/17 0347  TRIG 149   ------------------------------------------------------------------------------------------------------------------ No results for input(s): TSH, T4TOTAL, T3FREE, THYROIDAB in the last 72 hours.  Invalid input(s): FREET3 ------------------------------------------------------------------------------------------------------------------ No results for input(s): VITAMINB12, FOLATE, FERRITIN, TIBC, IRON, RETICCTPCT in the last 72 hours.  Coagulation profile No results for input(s): INR, PROTIME in the last 168 hours.  No results for input(s): DDIMER in the last 72 hours.  Cardiac Enzymes No results for input(s): CKMB, TROPONINI, MYOGLOBIN in the last 168 hours.  Invalid input(s): CK ------------------------------------------------------------------------------------------------------------------ Invalid input(s): POCBNP    Assessment & Plan  52 year old female patient currently in ICU for acute on chronic respiratory failure, pericardial effusion and heart failure exacerbation.  1. Acute on chronic respiratory failure with hypoxia Continue mechanical ventilator Status post bronchoscopy no endobronchial tumors, mass and foreign body Spontaneous breathing trials and awakening trials as per intensivist attending Probable obstructive sleep apnea Reconsult ENT when patient is ready to extubate  2. Acute on chronic diastolic heart failure Lasix as needed for diuresis  3.  Left lung pneumonia versus pericarditis  Opacification of left hemithorax secondary to atelectasis and effusion  4.Pericardial effusion with no evidence of pericardial tamponade Continue  Lasix for diuresis No need for chest tube as per Dr. Genevive Bi  5.Nutrition tube feeding to continue  6.  DVT prophylaxis  with subcu heparin 5000 units every 8 hourly      Code Status Orders  (From admission, onward)        Start     Ordered   07/29/17 2224  Full code  Continuous     07/29/17 2223    Code Status History    Date Active Date Inactive Code Status Order ID Comments User Context   06/07/2017 1347 06/12/2017 2013 Full Code [id number]  Bettey Costa, MD Inpatient      Time Spent in minutes   35 minutes  Greater than 50% of time spent in care coordination and counseling patient regarding the condition and plan of care.   Saundra Shelling M.D on 08/08/2017 at 4:40 PM  Between 7am to 6pm - Pager - [phone]  After 6pm go to [email or web address] - Proofreader  [employer]   Office  [phone]

## 2017-08-08 NOTE — Progress Notes (Signed)
SUBJECTIVE: Remains intubated. Sedated, resting comfortably.   Vitals:   08/08/17 0500 08/08/17 0600 08/08/17 0619 08/08/17 0800  BP: 102/65 121/74    Pulse: 61 63 63   Resp: (!) 22 16 14    Temp:    98.2 F (36.8 C)  TempSrc:    Axillary  SpO2: 94% 95% 96%   Weight:      Height:        Intake/Output Summary (Last 24 hours) at 08/08/2017 0829 Last data filed at 08/08/2017 0800 Gross per 24 hour  Intake 712.92 ml  Output 3300 ml  Net -2587.08 ml    LABS: Basic Metabolic Panel: Recent Labs    08/06/17 0347 08/08/17 0435  NA 141 138  K 4.9 5.0  CL 93* 93*  CO2 38* 34*  GLUCOSE 172* 148*  BUN 46* 54*  CREATININE 0.66 0.73  CALCIUM 9.8 10.5*  MG 2.3 2.8*  PHOS 4.6 4.1   Liver Function Tests: Recent Labs    08/05/17 0859  ALBUMIN 3.3*   No results for input(s): LIPASE, AMYLASE in the last 72 hours. CBC: Recent Labs    08/08/17 0435  WBC 12.4*  NEUTROABS 10.5*  HGB 14.8  HCT 48.3*  MCV 77.4*  PLT 426   Cardiac Enzymes: No results for input(s): CKTOTAL, CKMB, CKMBINDEX, TROPONINI in the last 72 hours. BNP: Invalid input(s): POCBNP D-Dimer: No results for input(s): DDIMER in the last 72 hours. Hemoglobin A1C: No results for input(s): HGBA1C in the last 72 hours. Fasting Lipid Panel: Recent Labs    08/06/17 0347  TRIG 149   Thyroid Function Tests: No results for input(s): TSH, T4TOTAL, T3FREE, THYROIDAB in the last 72 hours.  Invalid input(s): FREET3 Anemia Panel: No results for input(s): VITAMINB12, FOLATE, FERRITIN, TIBC, IRON, RETICCTPCT in the last 72 hours.   PHYSICAL EXAM General: Intubated, sedated, resting comfortably HEENT:  Normocephalic and atramatic Neck:  No JVD.  Lungs: Wheezing bilaterally Heart: HRRR . Normal S1 and S2 without gallops or murmurs.  Abdomen: Bowel sounds are positive, abdomen soft and non-tender  Msk:  Back normal, normal gait. Normal strength and tone for age. Extremities: No clubbing, cyanosis or edema.    Neuro: Intubated, sedated but opens eyes to her name.  TELEMETRY: NSR 62bpm  ASSESSMENT AND PLAN: Acute on chronic respiratory failure. Small to moderate pericardial effusion with no tamponade. Continue to wean respiratory support as tolerated, cardiac status is stable.   Principal Problem:   Acute on chronic respiratory failure with hypoxia (HCC) Active Problems:   Acute respiratory failure (HCC)   Pericardial effusion    Caroleen HammanKristin Garold Sheeler, NP-C 08/08/2017 8:29 AM Cell: [phone]

## 2017-08-09 DIAGNOSIS — R7989 Other specified abnormal findings of blood chemistry: Secondary | ICD-10-CM

## 2017-08-09 LAB — BASIC METABOLIC PANEL
ANION GAP: 12 (ref 5–15)
BUN: 60 mg/dL — ABNORMAL HIGH (ref 6–20)
CO2: 35 mmol/L — ABNORMAL HIGH (ref 22–32)
Calcium: 11.1 mg/dL — ABNORMAL HIGH (ref 8.9–10.3)
Chloride: 95 mmol/L — ABNORMAL LOW (ref 101–111)
Creatinine, Ser: 0.89 mg/dL (ref 0.44–1.00)
Glucose, Bld: 104 mg/dL — ABNORMAL HIGH (ref 65–99)
POTASSIUM: 4.4 mmol/L (ref 3.5–5.1)
SODIUM: 142 mmol/L (ref 135–145)

## 2017-08-09 LAB — GLUCOSE, CAPILLARY
GLUCOSE-CAPILLARY: 117 mg/dL — AB (ref 65–99)
GLUCOSE-CAPILLARY: 134 mg/dL — AB (ref 65–99)
Glucose-Capillary: 119 mg/dL — ABNORMAL HIGH (ref 65–99)
Glucose-Capillary: 185 mg/dL — ABNORMAL HIGH (ref 65–99)
Glucose-Capillary: 162 mg/dL — ABNORMAL HIGH (ref 65–99)
Glucose-Capillary: 218 mg/dL — ABNORMAL HIGH (ref 65–99)

## 2017-08-09 LAB — PHOSPHORUS: PHOSPHORUS: 3.9 mg/dL (ref 2.5–4.6)

## 2017-08-09 NOTE — Progress Notes (Signed)
Nutrition Follow-up  DOCUMENTATION CODES:   Morbid obesity  INTERVENTION:  Resume Vital High Protein at 40 mL/hr (960 mL goal daily volume) + Pro-Stat 30 mL BID via NGT. Provides 1160 kcal, 114 grams of protein, 806 mL H2O daily.  Continue liquid MVI daily per tube.  With free water flush of 100 mL Q8hrs patient is receiving total of 1106 mL H2O daily.  NUTRITION DIAGNOSIS:   Inadequate oral intake related to inability to eat as evidenced by NPO status.  Ongoing - addressing with TF regimen.  GOAL:   Provide needs based on ASPEN/SCCM guidelines  Met with TF regimen.  MONITOR:   Vent status, Labs, Weight trends, TF tolerance, I & O's  REASON FOR ASSESSMENT:   Ventilator, Consult Enteral/tube feeding initiation and management  ASSESSMENT:   52 year old female with PMHx of COPD, tobacco abuse, anxiety, essential HTN who was admitted with atypical chest pain, hypoxemic respiratory failure, and pericardial effusion and then transferred to SDU on 3/18 with worsening hypoxemia and complete opacification of left hemithorax suspected to be due to atelectasis initially requiring BiPAP but was intubated on AM of 3/19.  -Per ENT note today plan is for OR extubation on Tuesday AM (4/2).  Patient intubated. Off sedation. She is reporting that her abdomen feels better today. Tube feeds being restarted today  Access: NGT placed 3/19; terminates in distal gastric body per abdominal x-ray 3/19; 60 cm at left nare  TF: tube feeds have been held since 3/26 as patient began vomiting; abdominal x-ray found moderate amount of stool in colon; pt received SMOG enema on 3/27 and had a bowel movement  Patient is currently intubated on ventilator support MV: 8.5 L/min Temp (24hrs), Avg:98 F (36.7 C), Min:97.4 F (36.3 C), Max:98.4 F (36.9 C)  Propofol: N/A  Medications reviewed and include: Colace, famotidine, free water flush 100 mL Q8hrs, Novolog 0-15 units Q4hrs, Solu-Medrol 40 mg  Q12hrs IV, liquid MVI per tube, Senokot. Off fentanyl gtt since 0500.  Labs reviewed: CBG 110-185, Chloride 95, CO2 35, BUN 60.  I/O: 1950 mL UOP yesterday (0.8 mL/kg/hr)  Weight trend: 104.6 kg 3/28; -12.5 kg from 3/21 (goal has been negative fluid balance)  Discussed with RN and on rounds.  Diet Order:  No diet orders on file  EDUCATION NEEDS:   No education needs have been identified at this time  Skin:  Skin Assessment: Reviewed RN Assessment  Last BM:  08/08/2017 - medium type 6  Height:   Ht Readings from Last 1 Encounters:  07/31/17 5' (1.524 m)    Weight:   Wt Readings from Last 1 Encounters:  08/09/17 230 lb 9.6 oz (104.6 kg)    Ideal Body Weight:  45.5 kg  BMI:  Body mass index is 45.04 kg/m.  Estimated Nutritional Needs:   Kcal:  1143-1334 kcal (60-70% of PSU 2003b w/ MSJ 1716, Ve 8.3, Tmax 37.2); 22-25 kcal/kg IBW is 1001-1138 kcal  Protein:  >/= 114 grams (>/= 2.5 grams/kg IBW  Fluid:  1.4-1.6 L/day (30-35 mL/kg IBW)  Willey Blade, MS, RD, LDN Office: [phone] Pager: [phone] After Hours/Weekend Pager: [phone]

## 2017-08-09 NOTE — Progress Notes (Signed)
SUBJECTIVE: Remains intubated, alert, responsive to questions, denies pain.   Vitals:   08/09/17 0451 08/09/17 0600 08/09/17 0700 08/09/17 0800  BP:  114/79 (!) 146/93 123/87  Pulse:  85 (!) 106 90  Resp:  17 (!) 22 19  Temp:    (!) 97.4 F (36.3 C)  TempSrc:    Axillary  SpO2:  92% 94% 93%  Weight: 230 lb 9.6 oz (104.6 kg)     Height:        Intake/Output Summary (Last 24 hours) at 08/09/2017 0842 Last data filed at 08/09/2017 0800 Gross per 24 hour  Intake -  Output 2120 ml  Net -2120 ml    LABS: Basic Metabolic Panel: Recent Labs    08/08/17 0435 08/09/17 0516  NA 138 142  K 5.0 4.4  CL 93* 95*  CO2 34* 35*  GLUCOSE 148* 104*  BUN 54* 60*  CREATININE 0.73 0.89  CALCIUM 10.5* 11.1*  MG 2.8*  --   PHOS 4.1 3.9   Liver Function Tests: No results for input(s): AST, ALT, ALKPHOS, BILITOT, PROT, ALBUMIN in the last 72 hours. No results for input(s): LIPASE, AMYLASE in the last 72 hours. CBC: Recent Labs    08/08/17 0435  WBC 12.4*  NEUTROABS 10.5*  HGB 14.8  HCT 48.3*  MCV 77.4*  PLT 426   Cardiac Enzymes: No results for input(s): CKTOTAL, CKMB, CKMBINDEX, TROPONINI in the last 72 hours. BNP: Invalid input(s): POCBNP D-Dimer: No results for input(s): DDIMER in the last 72 hours. Hemoglobin A1C: No results for input(s): HGBA1C in the last 72 hours. Fasting Lipid Panel: No results for input(s): CHOL, HDL, LDLCALC, TRIG, CHOLHDL, LDLDIRECT in the last 72 hours. Thyroid Function Tests: No results for input(s): TSH, T4TOTAL, T3FREE, THYROIDAB in the last 72 hours.  Invalid input(s): FREET3 Anemia Panel: No results for input(s): VITAMINB12, FOLATE, FERRITIN, TIBC, IRON, RETICCTPCT in the last 72 hours.   PHYSICAL EXAM General: Intubated, alert HEENT:  Normocephalic and atramatic Neck:  No JVD.  Lungs: Mild wheezing bilaterally. Heart: HRRR . Normal S1 and S2 without gallops or murmurs.  Abdomen: Bowel sounds are positive, abdomen soft and non-tender   Msk:  Back normal, normal gait. Normal strength and tone for age. Extremities: No clubbing, cyanosis or edema.   Neuro: Alert and oriented X 3. Psych:  Good affect, responds appropriately  TELEMETRY: NSR 93bpm  ASSESSMENT AND PLAN:  Acute respiratory failure secondary to COPD and sleep apnea. History of small to moderate pericardial effusion without evidence of tamponade. Pt has made progress to wean of ventilator support, she has weaned off sedation and is spontaneously breathing over vent.   Continue to wean respiratory support as tolerated, cardiac status is stable.  Principal Problem:   Acute on chronic respiratory failure with hypoxia (HCC) Active Problems:   Acute respiratory failure (HCC)   Pericardial effusion    Caroleen HammanKristin Karyme Mcconathy, NP-C 08/09/2017 8:42 AM Cell: [phone]

## 2017-08-09 NOTE — Consult Note (Signed)
Discussed desire to look at attended extubation trial in the OR with Intensive Care physician today. There are factors to consider, given her risk factors for potential failure to extubate, difficulty with intubation, and difficult tracheostomy. Our main concern is that performing this on the weekend or immediately preceding the weekend means that should she require emergent reintubation or tracheostomy, this would fall at a time when less staff is available immediately, especially anesthesia and surgical staff. This would be best performed early in the day during the week. We were unable to secure OR time Monday following Dr. Wyn ForsterJuengel's morning surgery, but I was able to get a first start here Tuesday AM with Dr. Andee PolesVaught to assist me.  Will follow up on patient Monday, but the plan would be an attended OR extubation Tuesday AM. Sedation status should be minimized overnight Monday night so that she is in the best position to maintain her airway at extubation. She needs to be consented for possible tracheostomy, and if she either fails extubation or her pulmonary condition deteriorates over the weekend and extubation is not felt appropriate, we would perform a tracheostomy at that time.

## 2017-08-09 NOTE — Progress Notes (Signed)
Name: [name] MRN: [id number] DOB: 1966-03-21    ADMISSION DATE:  07/29/2017 CONSULTATION DATE: 07/30/2017  REFERRING MD : Dr. Sherryll Burger   CHIEF COMPLAINT:   BRIEF PATIENT DESCRIPTION:  28 F presented to ED 03/17 and admitted to hospitalist service with hypoxemic respiratory failure, atypical chest pain, pericardial effusion (no tamponade by Echo).  Transferred to SDU 03/18 with worsening hypoxemia and complete opacification of left hemithorax on CXR.  Initially supported with BiPAP.  Intubated AM 03/19.  Very difficult airway!!!  MAJOR EVENTS/TEST RESULTS: 03/17 admission as documented above 03/17 CTA chest: No PE.  Moderate pericardial effusion and pericardial enhancement suggesting acute pericarditis.  Left greater than right pleural effusions with LLL atelectasis versus infiltrate. 03/17 TTE: LVEF 85%.  Grade 1 diastolic dysfunction.  No evidence of cardiac tamponade 03/18 repeat TTE: LVEF 75%.  Grade 1 diastolic dysfunction.  Small anterior and posterior pericardial effusion.  Moderate left pleural effusion.  No evidence of tamponade.  Mildly dilated RA, moderately dilated RV 03/19 CT chest: Extensive consolidation of left lung with minimal aeration of LUL.  Moderate left pleural effusion.  Patchy airspace disease in right lung. 03/20 much improved aeration of left lung on chest x-ray.  Cuff leak test negative.  Patient remains on PEEP 10 cm H2O and FiO2 60%. 03/20 ENT consultation requested for possible early trach versus high risk extubation in OR when meets criteria 3/21 + F/C on WUA. Remains on PEEP 8, FiO2 60%. Sedation regimen changed from propofol to fentanyl + intermittent midaz 3/22 Episode of desaturation in the night, responded to Lasix, Steroids; Propofol added for sedation 3/25 Started on SBT  INDWELLING DEVICES:: ETT 03/19 >>  L [place] CVL 03/19 >>   MICRO DATA: MRSA PCR 03/19 >> NEG Urine 03/17 >> multiple species present suggesting contaminated specimen Resp 03/19  >>   ANTIMICROBIALS:  Vanc 03/17 >> 03/17 Cefepime 03/17 >>     SUBJECTIVE:  Intubated, sedated. RASS -2. + F/C on WUA. Synchronous with ventilator  VITAL SIGNS: Temp:  [97.4 F (36.3 C)-99 F (37.2 C)] 99 F (37.2 C) (03/28 1600) Pulse Rate:  [73-118] 98 (03/28 1700) Resp:  [17-24] 22 (03/28 1700) BP: (104-146)/(60-93) 114/75 (03/28 1700) SpO2:  [90 %-95 %] 91 % (03/28 1700) FiO2 (%):  [35 %-50 %] 35 % (03/28 1612) Weight:  [230 lb 9.6 oz (104.6 kg)] 230 lb 9.6 oz (104.6 kg) (03/28 0451)   HEMODYNAMICS:  No compromise   VENTILATOR SETTINGS: Vent Mode: PSV FiO2 (%):  [35 %-50 %] 35 % PEEP:  [5 cmH20] 5 cmH20 Pressure Support:  [10 cmH20] 10 cmH20  INTAKE / OUTPUT: I/O last 3 completed shifts: In: 204.4 [I.V.:204.4] Out: 3925 [Urine:2775; Emesis/NG output:1150]  PHYSICAL EXAMINATION: General: Intubated but awake and appropriate,  Neuro: EOMI, PERRLA, moves all extremities, follows all commands HEENT: NCAT, sclerae white Cardiovascular: Distant heart sounds, regular, no Murmurs Lungs: Few scattered rhonchi, no wheezes, oral secretions Abdomen: Markedly obese, soft, NT, + BS Extremities: Warm, 1+ pedal edema and upper extremities odema Skin: No lesions noted, warm and dry   LABS:  BMET Recent Labs  Lab 08/06/17 0347 08/08/17 0435 08/09/17 0516  NA 141 138 142  K 4.9 5.0 4.4  CL 93* 93* 95*  CO2 38* 34* 35*  BUN 46* 54* 60*  CREATININE 0.66 0.73 0.89  GLUCOSE 172* 148* 104*    Electrolytes Recent Labs  Lab 08/05/17 0532  08/06/17 0347 08/08/17 0435 08/09/17 0516  CALCIUM 9.2   < >  9.8 10.5* 11.1*  MG 2.3  --  2.3 2.8*  --   PHOS 3.3   < > 4.6 4.1 3.9   < > = values in this interval not displayed.    CBC Recent Labs  Lab 08/03/17 0354 08/05/17 0532 08/08/17 0435  WBC 10.0 9.9 12.4*  HGB 14.2 13.6 14.8  HCT 46.6 44.3 48.3*  PLT 394 375 426    Coag's No results for input(s): APTT, INR in the last 168 hours.  Sepsis Markers Recent  Labs  Lab 08/03/17 1924  PROCALCITON <0.10    ABG Recent Labs  Lab 08/04/17 0500 08/04/17 1153 08/06/17 0347  PHART 7.33* 7.46* 7.47*  PCO2ART 90* 66* 63*  PO2ART 81* 115* 75*    Liver Enzymes Recent Labs  Lab 08/05/17 0532 08/05/17 0859  ALBUMIN 2.9* 3.3*    Cardiac Enzymes No results for input(s): TROPONINI, PROBNP in the last 168 hours.  Glucose Recent Labs  Lab 08/08/17 2000 08/08/17 2324 08/09/17 0454 08/09/17 0717 08/09/17 1120 08/09/17 1634  GLUCAP 147* 159* 117* 119* 185* 134*    CXR: Increased bibasilar opacities - infiltrate/effusion/atelectasis    ASSESSMENT / PLAN:  PULMONARY A: Acute hypoxemic respiratory failure with ARDS- has significantly improved, oxygenating well and has been doing well on SBT. Pleural effusions, likely pneumonia although procalcitonin is negative. Repeated tracheal culture is negative thus far Smoker with suspected COPD with exacerbation Probable OSA (based on daughter's history) Very difficult airway!!!  P:   Continue nebulized steroids, bronchodilators,    Dr. Willeen CassBennett plans for extubation in OR on Tuesday Will  keep in negative fluid balance; Completed 21 doses of Q 8 hours of  Cefepime Continue SBT BID, Doing well, no cuff leak heard today but plans for trial of extubation on Tues  CARDIOVASCULAR A:  Small Pericardial effusion without evidence of tamponade Hemodynamically stable P:  Continue hemodynamic monitoring MAP goal >65 mmHg  RENAL A:   AKI, resolved Azotemia from Steroids P:   Monitor BMET intermittently Monitor I/Os Correct electrolytes as indicated  D/C  Lasix today as BUN has trended > 50, may be from steroids as well ;  will monitor urinary output and keep in negative fluid balance  GASTROINTESTINAL A:   Severe obesity Constipation has improved P:   SUP: Enteral famotidine TF restarted as patient responded to enema  HEMATOLOGIC A:   Polycythemia on admission P:  DVT px: SQ  heparin Monitor CBC intermittently Transfuse per usual guidelines   INFECTIOUS A:   Probable pneumonia Possible acute pericarditis P:   Monitor temp, WBC count Micro and abx as above   ENDOCRINE A:   Mild hyperglycemia without documented history of diabetes Steroid-induced hyperglycemia P:   Moderate scale SSI, target Glucose monitoring/ management  NEUROLOGIC A:   ICU/ventilator associated discomfort P:   RASS goal: -2, -3 Revised PAD protocol- on low dose Fentanyl drip   FAMILY: Daughter Valentina GuKemia Brown updated in person.  CCM time: 35 mins The above time includes time spent in consultation with patient and/or family members and reviewing care plan on multidisciplinary rounds   Jackson LatinoKarol Mylissa Lambe, MD PCCM service Pager [phone] 08/09/2017, 5:57 PM

## 2017-08-09 NOTE — Progress Notes (Signed)
[place] at [facility]                                                                                                                                                                                  Patient Demographics   [name], is a 52 y.o. female, DOB - 09/05/1965, HWE:[id number]  Admit date - 07/29/2017   Admitting Physician Vaughan Basta, MD  Outpatient Primary MD for the patient is [name], [name], MD   LOS - 11  Subjective: Patient seen and evaluated today patient on ventilator breathing spontaneously Patient is awake responds to verbal commands by blinking eyes  Review of Systems:   Could not be obtained as patient is on ventilator  Vitals:   Vitals:   08/09/17 0900 08/09/17 1000 08/09/17 1100 08/09/17 1200  BP: 122/69 129/78 107/67 110/68  Pulse: 82 (!) 110 83 (!) 103  Resp: (!) 21 (!) 24 (!) 22 (!) 23  Temp:    98.4 F (36.9 C)  TempSrc:    Oral  SpO2: 91% 93% 94% 93%  Weight:      Height:        Wt Readings from Last 3 Encounters:  08/09/17 104.6 kg (230 lb 9.6 oz)  06/12/17 119 kg (262 lb 6.4 oz)  09/29/16 117.9 kg (260 lb)     Intake/Output Summary (Last 24 hours) at 08/09/2017 1508 Last data filed at 08/09/2017 1200 Gross per 24 hour  Intake 90 ml  Output 1925 ml  Net -1835 ml    Physical Exam:   GENERAL: 52 year old female patient lying on the bed on ventilator FiO2 45% PEEP 5pressure support 10 HEAD, EYES, EARS, NOSE AND THROAT: Atraumatic, normocephalic. Extraocular muscles are intact. Pupils equal and reactive to light. Sclerae anicteric. No conjunctival injection. ET tube noted NECK: Supple. There is no jugular venous distention. No bruits, no lymphadenopathy, no thyromegaly.  HEART: Regular rate and rhythm,. No murmurs, no rubs, no clicks.  LUNGS: Decreased air flow left lung, adequate air flow right lung.  Rales heard in left lung ABDOMEN: Soft, flat, nontender, nondistended. Has good bowel  sounds. No hepatosplenomegaly appreciated.  EXTREMITIES: No evidence of any cyanosis, clubbing, or peripheral edema.  +2 pedal and radial pulses bilaterally.  NEUROLOGIC: The patient is alert, awake  On ventilator SKIN: Moist and warm with no rashes appreciated.  Psych: could not be assessed LN: No inguinal LN enlargement    Antibiotics   Anti-infectives (From admission, onward)   Start     Dose/Rate Route Frequency Ordered Stop   07/31/17 0800  ceFEPIme (MAXIPIME) 2 g in sodium chloride 0.9 % 100 mL IVPB     2  g 200 mL/hr over 30 Minutes Intravenous Every 8 hours 07/31/17 0627 08/07/17 0030   07/29/17 1915  ceFEPIme (MAXIPIME) 2 g in sodium chloride 0.9 % 100 mL IVPB     2 g 200 mL/hr over 30 Minutes Intravenous  Once 07/29/17 1911 07/29/17 2050   07/29/17 1915  vancomycin (VANCOCIN) IVPB 1000 mg/200 mL premix     1,000 mg 200 mL/hr over 60 Minutes Intravenous  Once 07/29/17 1911 07/29/17 2342      Medications   Scheduled Meds: . budesonide (PULMICORT) nebulizer solution  0.5 mg Nebulization BID  . chlorhexidine gluconate (MEDLINE KIT)  15 mL Mouth Rinse BID  . docusate  100 mg Per Tube BID  . famotidine  20 mg Per Tube BID  . feeding supplement (PRO-STAT SUGAR FREE 64)  30 mL Per Tube BID  . feeding supplement (VITAL HIGH PROTEIN)  1,000 mL Per Tube Q24H  . free water  100 mL Per Tube Q8H  . heparin  5,000 Units Subcutaneous Q8H  . insulin aspart  0-15 Units Subcutaneous Q4H  . ipratropium-albuterol  3 mL Nebulization Q4H  . mouth rinse  15 mL Mouth Rinse 10 times per day  . methylPREDNISolone (SOLU-MEDROL) injection  40 mg Intravenous Q12H  . multivitamin  15 mL Per Tube Daily  . senna-docusate  1 tablet Oral BID  . sodium chloride flush  10-40 mL Intracatheter Q12H   Continuous Infusions: . fentaNYL infusion INTRAVENOUS Stopped (08/09/17 0500)   PRN Meds:.albuterol, bisacodyl, fentaNYL, ondansetron (ZOFRAN) IV, sodium chloride flush   Data Review:   Micro  Results Recent Results (from the past 240 hour(s))  MRSA PCR Screening     Status: None   Collection Time: 07/31/17  1:29 AM  Result Value Ref Range Status   MRSA by PCR NEGATIVE NEGATIVE Final    Comment:        The GeneXpert MRSA Assay (FDA approved for NASAL specimens only), is one component of a comprehensive MRSA colonization surveillance program. It is not intended to diagnose MRSA infection nor to guide or monitor treatment for MRSA infections. Performed at [facility], [address] [place], [place] [address]   Culture, respiratory (NON-Expectorated)     Status: None   Collection Time: 07/31/17  2:50 PM  Result Value Ref Range Status   Specimen Description   Final    TRACHEAL ASPIRATE Performed at [facility], [address] [place], [place] [address]    Special Requests   Final    NONE Performed at [facility], [address] [place], [place] [address]    Gram Stain   Final    RARE WBC PRESENT, PREDOMINANTLY PMN RARE GRAM POSITIVE COCCI RARE GRAM POSITIVE RODS    Culture   Final    Consistent with normal respiratory flora. Performed at [facility], [place] [address] [place], [place] [address]    Report Status 08/03/2017 FINAL  Final  Culture, respiratory (NON-Expectorated)     Status: None   Collection Time: 08/04/17 11:53 AM  Result Value Ref Range Status   Specimen Description   Final    TRACHEAL ASPIRATE Performed at [facility], [address], [place], [place] [address]    Special Requests   Final    NONE Performed at [facility], [place], [place] [address]    Gram Stain   Final    RARE WBC PRESENT,BOTH PMN AND MONONUCLEAR RARE SQUAMOUS EPITHELIAL CELLS PRESENT RARE  GRAM POSITIVE COCCI    Culture   Final    Consistent with normal respiratory flora. Performed at [facility], [place] [address] [place], [place] [address]    Report Status 08/06/2017 FINAL  Final     Radiology Reports Dg Chest 2 View  Result Date: 07/30/2017 CLINICAL DATA:  Shortness of breath. Acute on chronic respiratory failure. Hypoxia. Morbid obesity. EXAM: CHEST - 2 VIEW COMPARISON:  Chest x-ray and CT chest 07/29/2017. FINDINGS: Cardiac silhouette is partially silhouetted by large pleural effusion on the LEFT. There is a whiteout of the LEFT hemithorax, which could represent mucous plugging and or increasing effusion. The RIGHT lung demonstrates interstitial edema with small effusion. IMPRESSION: Marked worsening aeration. Complete opacity of the LEFT hemithorax represents a significant change from yesterday's radiograph. Electronically Signed   By: Staci Righter M.D.   On: 07/30/2017 08:45   Dg Chest 2 View  Result Date: 07/29/2017 CLINICAL DATA:  52 year old female with history of chest pain since last week. Pain most severe during deep breathing. EXAM: CHEST - 2 VIEW COMPARISON:  Chest x-ray 06/10/2017. FINDINGS: Left lower lobe airspace consolidation concerning for pneumonia. Small bilateral pleural effusions. Mild diffuse peribronchial cuffing. Mild cardiomegaly. Upper mediastinal contours appear widened, but are stable compared to prior examinations. IMPRESSION: 1. Left lower lobe pneumonia with small bilateral pleural effusions. Followup PA and lateral chest X-ray is recommended in 3-4 weeks following trial of antibiotic therapy to ensure resolution and exclude underlying malignancy. 2. Mild cardiomegaly. Electronically Signed   By: Vinnie Langton M.D.   On: 07/29/2017 17:37   Dg Abd 1 View  Result Date: 08/07/2017 CLINICAL DATA:  Fecal impaction. EXAM: ABDOMEN - 1 VIEW COMPARISON:  07/31/2017 FINDINGS: Nasogastric tube has its tip in the midportion of the stomach. Clips in the right upper quadrant consistent with previous cholecystectomy. Single clip in the right lower quadrant, possibly appendectomy. Moderate amount of fecal matter within the right colon. No sign of  obstruction. IMPRESSION: Nasogastric tube in the mid stomach. Moderate amount of fecal matter in the right colon. No sign of obstruction. Electronically Signed   By: Nelson Chimes M.D.   On: 08/07/2017 11:32   Ct Chest Wo Contrast  Result Date: 07/31/2017 CLINICAL DATA:  She presented to [facility] ER on 03/17 with c/o intermittent chest pain worse during inspiration and nonproductive cough onset 1 week prior to presentation. On 03/18 pt developed hypotension and worsening acute hypoxic respiratory failure. EXAM: CT CHEST WITHOUT CONTRAST TECHNIQUE: Multidetector CT imaging of the chest was performed following the standard protocol without IV contrast. COMPARISON:  07/29/2017, 06/10/2017 FINDINGS: Cardiovascular: Stable cardiomegaly. Small pericardial effusion. Dilatation of main pulmonary artery as well as the right and left main pulmonary arteries as can be seen with pulmonary arterial hypertension. Thoracic aorta is normal in caliber. Mediastinum/Nodes: Enlarged prevascular lymph node measuring 17 mm in short axis. Multiple other smaller mediastinal lymph nodes are noted. Thyroid gland, trachea, and esophagus demonstrate no significant findings. Lungs/Pleura: Trace right pleural effusion. Small left pleural effusion. Left lower lobe airspace disease with air bronchograms concerning for pneumonia. Improved aeration of left upper lobe. Mild persistent right upper lobe airspace disease likely reflecting atelectasis. Stable 6 mm right middle lobe pulmonary nodule unchanged compared with 09/01/2013. Upper Abdomen: Aortic atherosclerosis. Musculoskeletal: No acute osseous abnormality. No aggressive osseous lesion. IMPRESSION: 1. Left lower lobe airspace disease with air bronchograms concerning for combination of pneumonia and atelectasis. Mild mediastinal lymphadenopathy likely reactive. 2. Small left pleural effusion.  Mild right upper lobe atelectasis. 3.  Aortic Atherosclerosis (ICD10-I70.0). Electronically Signed    By: Kathreen Devoid   On: 07/31/2017 12:47   Ct Angio Chest Pe W And/or Wo Contrast  Result Date: 07/29/2017 CLINICAL DATA:  52 year old female with history of chest pain since last week. Difficulty taking deep breaths. EXAM: CT ANGIOGRAPHY CHEST WITH CONTRAST TECHNIQUE: Multidetector CT imaging of the chest was performed using the standard protocol during bolus administration of intravenous contrast. Multiplanar CT image reconstructions and MIPs were obtained to evaluate the vascular anatomy. CONTRAST:  100m ISOVUE-370 IOPAMIDOL (ISOVUE-370) INJECTION 76% COMPARISON:  Chest CT 06/10/2017. FINDINGS: Cardiovascular: No filling defects within the pulmonary arterial tree to suggest underlying pulmonary embolism. Heart size is normal. Moderate volume of pericardial fluid with pericardial enhancement concerning for pericarditis. No pericardial calcification. There is aortic atherosclerosis, as well as atherosclerosis of the great vessels of the mediastinum and the coronary arteries, including calcified atherosclerotic plaque in the left anterior descending coronary artery. Mediastinum/Nodes: Multiple prominent borderline enlarged and enlarged mediastinal lymph nodes, largest of which is in the prevascular nodal station measuring up to 22 mm in short axis, increased compared to the prior study. Esophagus is unremarkable in appearance. No axillary lymphadenopathy. Lungs/Pleura: Moderate left pleural effusion with near complete passive atelectasis of the left lower lobe. Some dependent subsegmental atelectasis is also noted in the left upper lobe. Trace right pleural effusion. Throughout the remaining portions of the lungs there is a background of ground-glass attenuation and mild interlobular septal thickening, indicative of a background of mild interstitial pulmonary edema. 5 mm right middle lobe pulmonary nodule (axial image 47 of series 7), stable dating back to at least 09/01/2013, considered definitively benign.  Upper Abdomen: Aortic atherosclerosis. Musculoskeletal: There are no aggressive appearing lytic or blastic lesions noted in the visualized portions of the skeleton. Review of the MIP images confirms the above findings. IMPRESSION: 1. No evidence of pulmonary embolism. 2. Extensive pericardial enhancement with moderate volume of pericardial fluid, indicative of an acute pericarditis. 3. This is associated with increasing mediastinal lymphadenopathy compared to the prior study. 4. In addition, there is evidence of mild pulmonary edema and bilateral pleural effusions (left greater than right) with extensive areas of passive atelectasis in the left lung. 5. Aortic atherosclerosis, in addition to left anterior descending coronary artery disease. Please note that although the presence of coronary artery calcium documents the presence of coronary artery disease, the severity of this disease and any potential stenosis cannot be assessed on this non-gated CT examination. Assessment for potential risk factor modification, dietary therapy or pharmacologic therapy may be warranted, if clinically indicated. Aortic Atherosclerosis (ICD10-I70.0). Electronically Signed   By: DVinnie LangtonM.D.   On: 07/29/2017 19:45   Dg Chest Port 1 View  Result Date: 08/08/2017 CLINICAL DATA:  Acute respiratory distress. History of acute on chronic respiratory failure, pericardial effusion, COPD, current smoker. EXAM: PORTABLE CHEST 1 VIEW COMPARISON:  Portable chest x-ray of August 07, 2017 FINDINGS: The lungs are well-expanded. Persistent bibasilar interstitial and airspace opacities are present. The cardiac silhouette is enlarged. The pulmonary vascularity is mildly congested. The endotracheal tube tip lies 3.3 cm above the carina. The esophagogastric tube tip projects below the inferior margin of the image. The left internal jugular venous catheter tip projects over the junction of the proximal and midportions of the SVC. IMPRESSION:  Bibasilar atelectasis or pneumonia. Mild CHF. The support tubes are in reasonable position. Electronically Signed   By: David  JMartiniqueM.D.  On: 08/08/2017 09:39   Dg Chest Port 1 View  Result Date: 08/07/2017 CLINICAL DATA:  Hypoxia EXAM: PORTABLE CHEST 1 VIEW COMPARISON:  August 05, 2017 FINDINGS: Endotracheal tube tip is 4.0 cm above the carina. Central catheter tip is in superior vena cava. Nasogastric tube tip and side port are below the diaphragm. No pneumothorax. There are small pleural effusions bilaterally. There is consolidation in the left lower lobe as well as to a lesser extent in the right base. Heart is enlarged with pulmonary venous hypertension. No adenopathy. No bone lesions. IMPRESSION: Tube and catheter positions as described without pneumothorax. Bibasilar consolidation, more on the left than on the right, likely multifocal pneumonia. A degree of alveolar edema in the bases is possible. There is underlying pulmonary vascular congestion with small pleural effusions bilaterally. Electronically Signed   By: Lowella Grip III M.D.   On: 08/07/2017 07:36   Dg Chest Port 1 View  Result Date: 08/05/2017 CLINICAL DATA:  Acute respiratory failure, COPD, hypertension, smoker EXAM: PORTABLE CHEST 1 VIEW COMPARISON:  Portable exam 0548 hours compared to 08/04/2017 FINDINGS: Tip of endotracheal tube projects 4.1 cm above carina. Nasogastric tube extends into stomach. LEFT subclavian central venous catheter tip projects over SVC. Numerous EKG leads project over chest. Enlargement of cardiac silhouette with pulmonary vascular congestion. Scattered interstitial infiltrates likely representing pulmonary edema. Decreased LEFT pleural effusion. LEFT lower lobe opacification could represent atelectasis or coexistent consolidation. No pneumothorax. IMPRESSION: Persistent pulmonary edema with decreased LEFT pleural effusion. LEFT lower lobe atelectasis versus consolidation. Electronically Signed   By: Lavonia Dana M.D.   On: 08/05/2017 08:11   Dg Chest Port 1 View  Result Date: 08/04/2017 CLINICAL DATA:  Respiratory failure. EXAM: PORTABLE CHEST 1 VIEW COMPARISON:  Yesterday. FINDINGS: Endotracheal tube in satisfactory position. Nasogastric tube extending the stomach. Stable left subclavian catheter. Increased patchy opacity in the perihilar regions bilaterally. No significant change in patchy density interstitial prominence elsewhere in the right lung. Increased left pleural fluid. Stable enlarged cardiac silhouette. No acute bony abnormality. IMPRESSION: 1. Mild worsening of changes of pulmonary edema. 2. Increased left pleural fluid. 3. Stable cardiomegaly. Electronically Signed   By: Claudie Revering M.D.   On: 08/04/2017 07:05   Dg Chest Port 1 View  Result Date: 08/03/2017 CLINICAL DATA:  Acute respiratory failure. EXAM: PORTABLE CHEST 1 VIEW COMPARISON:  Radiograph yesterday at 0456 hour.  Chest CT 07/31/2016 FINDINGS: Endotracheal tube tip at the thoracic inlet. Enteric tube in place, tip below the diaphragm. Left central line with tip in the proximal SVC. Unchanged cardiomegaly. Unchanged vascular congestion/pulmonary edema. Bibasilar opacities, left greater than right, unchanged. No pneumothorax. IMPRESSION: Unchanged appearance of the chest with cardiomegaly, pulmonary edema/vascular congestion, and bibasilar opacities, left greater than right. Electronically Signed   By: Jeb Levering M.D.   On: 08/03/2017 01:48   Dg Chest Port 1 View  Result Date: 08/02/2017 CLINICAL DATA:  52 year old female with respiratory failure. Left lung pneumonia. EXAM: PORTABLE CHEST 1 VIEW COMPARISON:  08/01/2017, chest CT 07/31/2017, and earlier. FINDINGS: Portable AP semi upright view at 0456 hours. Endotracheal tube tip at the level the clavicles. Enteric tube courses to the abdomen, tip not included. Stable left IJ or subclavian central line. Substantially improved left lung ventilation since 07/31/2017, although  residual moderate confluent left lung base opacity remains. Interval increasing opacity at the right lung base. No pneumothorax. Chronic but increased underlying bilateral pulmonary interstitial opacity compared to 2018, possibly mild vascular congestion. Small bilateral pleural effusions  are suspected. Stable cardiac size and mediastinal contours. Cardiomegaly and pericardial effusion noted on chest CT and CTA this month. Paucity of bowel gas in the upper abdomen. IMPRESSION: 1.  Stable lines and tubes. 2. Improved left lung multilobar pneumonia since 07/31/2017, but moderate residual at the lung base. 3. Worsening ventilation at the right lung base over that same time suspicious for right lung involvement now. 4. Stable cardiomegaly.  Mild vascular congestion/pulmonary edema. 5. Small bilateral pleural effusions suspected. Electronically Signed   By: Genevie Ann M.D.   On: 08/02/2017 11:00   Dg Chest Port 1 View  Result Date: 08/01/2017 CLINICAL DATA:  Respiratory failure EXAM: PORTABLE CHEST 1 VIEW COMPARISON:  Chest radiograph and chest CT July 31, 2017 FINDINGS: Endotracheal tube tip is 3.7 cm above the carina. Central catheter tip is in the superior vena cava. No pneumothorax. There has been significant partial clearing on the left compared to 1 day prior. There has been the significant clearing of diffuse consolidation and effusion. There remains atelectatic change in the left base as well as small left effusion. There has been a clearing of consolidation from the right upper lobe. Currently the right lung is clear except for atelectasis in the left base. There is cardiomegaly with pulmonary venous hypertension. No adenopathy evident. No bone lesions. IMPRESSION: Tube and catheter positions as described without pneumothorax. Significant clearing at the throughout the left lung and right upper lobe. There remains atelectatic change in each lower lung zone as well as underlying pulmonary vascular congestion.  There is felt to be small left residual pleural effusion. Electronically Signed   By: Lowella Grip III M.D.   On: 08/01/2017 07:56   Dg Chest Port 1 View  Result Date: 07/31/2017 CLINICAL DATA:  Intubation and central line placement. EXAM: PORTABLE CHEST 1 VIEW COMPARISON:  Chest x-ray from same day at 0529. FINDINGS: Interval placement of an endotracheal tube with the tip approximately 1 cm above the level of the carina. Enteric tube seen entering the stomach. Left subclavian central venous catheter with the tip at the cavoatrial junction. Stable cardiomegaly. Unchanged complete opacification of the left hemithorax due to large pleural effusion and left lung collapse. Increasing airspace disease within the right upper lobe. No acute osseous abnormality. IMPRESSION: 1. Interval placement of an endotracheal tube with the tip approximately 1 cm above the level of the carina. Recommend retraction 2-3 cm. 2. Appropriately positioned left subclavian central venous catheter. 3. Unchanged large left pleural effusion and left lung collapse. 4. Worsening airspace disease in the right upper lobe which could reflect edema or infection. Electronically Signed   By: Titus Dubin M.D.   On: 07/31/2017 10:51   Dg Chest Port 1 View  Result Date: 07/31/2017 CLINICAL DATA:  Acute respiratory failure EXAM: PORTABLE CHEST 1 VIEW COMPARISON:  07/30/2017 FINDINGS: Dense consolidation of the left hemithorax unchanged due to effusion and collapse. Progression of right lower lobe atelectasis/infiltrate. Diffuse airspace disease on the right may represent edema. IMPRESSION: Persistent collapse of the left lung Progressive right lower lobe atelectasis/infiltrate and small right effusion. Electronically Signed   By: Franchot Gallo M.D.   On: 07/31/2017 07:26   Dg Abd Portable 1v  Result Date: 07/31/2017 CLINICAL DATA:  NG tube placement. EXAM: PORTABLE ABDOMEN - 1 VIEW COMPARISON:  None. FINDINGS: Enteric tube in place with  the tip in the distal gastric body. Nonobstructive bowel gas pattern. No acute osseous abnormality. IMPRESSION: Enteric tube appropriately positioned with the tip in the distal  gastric body. Electronically Signed   By: Titus Dubin M.D.   On: 07/31/2017 10:52     CBC Recent Labs  Lab 08/03/17 0354 08/05/17 0532 08/08/17 0435  WBC 10.0 9.9 12.4*  HGB 14.2 13.6 14.8  HCT 46.6 44.3 48.3*  PLT 394 375 426  MCV 79.5* 78.9* 77.4*  MCH 24.3* 24.3* 23.7*  MCHC 30.5* 30.8* 30.6*  RDW 21.4* 21.0* 21.8*  LYMPHSABS  --   --  1.2  MONOABS  --   --  0.7  EOSABS  --   --  0.0  BASOSABS  --   --  0.0    Chemistries  Recent Labs  Lab 08/04/17 0630  08/05/17 0532 08/05/17 0859 08/06/17 0347 08/08/17 0435 08/09/17 0516  NA 141  --  140 139 141 138 142  K 5.4*   < > 4.8 4.7 4.9 5.0 4.4  CL 98*  --  95* 91* 93* 93* 95*  CO2 37*  --  37* 37* 38* 34* 35*  GLUCOSE 151*  --  174* 198* 172* 148* 104*  BUN 36*  --  44* 43* 46* 54* 60*  CREATININE 0.68  --  0.77 0.70 0.66 0.73 0.89  CALCIUM 9.2  --  9.2 9.5 9.8 10.5* 11.1*  MG 2.3  --  2.3  --  2.3 2.8*  --    < > = values in this interval not displayed.   ------------------------------------------------------------------------------------------------------------------ estimated creatinine clearance is 79.7 mL/min (by C-G formula based on SCr of 0.89 mg/dL). ------------------------------------------------------------------------------------------------------------------ No results for input(s): HGBA1C in the last 72 hours. ------------------------------------------------------------------------------------------------------------------ No results for input(s): CHOL, HDL, LDLCALC, TRIG, CHOLHDL, LDLDIRECT in the last 72 hours. ------------------------------------------------------------------------------------------------------------------ No results for input(s): TSH, T4TOTAL, T3FREE, THYROIDAB in the last 72 hours.  Invalid input(s):  FREET3 ------------------------------------------------------------------------------------------------------------------ No results for input(s): VITAMINB12, FOLATE, FERRITIN, TIBC, IRON, RETICCTPCT in the last 72 hours.  Coagulation profile No results for input(s): INR, PROTIME in the last 168 hours.  No results for input(s): DDIMER in the last 72 hours.  Cardiac Enzymes No results for input(s): CKMB, TROPONINI, MYOGLOBIN in the last 168 hours.  Invalid input(s): CK ------------------------------------------------------------------------------------------------------------------ Invalid input(s): POCBNP    Assessment & Plan  52 year old female patient currently in ICU for acute on chronic respiratory failure, pericardial effusion and heart failure exacerbation.  1. Acute on chronic respiratory failure with hypoxia On ventilator breathing spontaneoulsy Weaning trials Off sedation Spontaneous breathing trials Status post bronchoscopy no endobronchial tumors, mass and foreign body  2. Acute on chronic diastolic heart failure Lasix as needed for diuresis  3.  Left lung pneumonia versus pericarditis  Opacification of left hemithorax secondary to atelectasis and effusion  4.Pericardial effusion with no evidence of pericardial tamponade S/p cardiology f/u  5.Nutrition tube feeding to continue  6.  DVT prophylaxis with subcu heparin 5000 units every 8 hourly      Code Status Orders  (From admission, onward)        Start     Ordered   07/29/17 2224  Full code  Continuous     07/29/17 2223    Code Status History    Date Active Date Inactive Code Status Order ID Comments User Context   06/07/2017 1347 06/12/2017 2013 Full Code [id number]  Bettey Costa, MD Inpatient      Time Spent in minutes   35 minutes  Greater than 50% of time spent in care coordination and counseling patient regarding the condition and plan of care.   Pavan Pyreddy  M.D on 08/09/2017 at 3:08  PM  Between 7am to 6pm - Pager - [phone]  After 6pm go to [email or web address] - Proofreader  [employer]   Office  [phone]

## 2017-08-10 ENCOUNTER — Inpatient Hospital Stay: Payer: Medicaid Other

## 2017-08-10 LAB — BASIC METABOLIC PANEL
Anion gap: 11 (ref 5–15)
BUN: 70 mg/dL — AB (ref 6–20)
CHLORIDE: 98 mmol/L — AB (ref 101–111)
CO2: 34 mmol/L — ABNORMAL HIGH (ref 22–32)
CREATININE: 0.8 mg/dL (ref 0.44–1.00)
Calcium: 10.8 mg/dL — ABNORMAL HIGH (ref 8.9–10.3)
GFR calc Af Amer: >60 mL/min (ref >60)
GFR calc non Af Amer: >60 mL/min (ref >60)
GLUCOSE: 148 mg/dL — AB (ref 65–99)
POTASSIUM: 4.4 mmol/L (ref 3.5–5.1)
SODIUM: 143 mmol/L (ref 135–145)

## 2017-08-10 LAB — CBC
HEMATOCRIT: 52.7 % — AB (ref 35.0–47.0)
Hemoglobin: 16.5 g/dL — ABNORMAL HIGH (ref 12.0–16.0)
MCH: 24.7 pg — ABNORMAL LOW (ref 26.0–34.0)
MCHC: 31.4 g/dL — AB (ref 32.0–36.0)
MCV: 78.6 fL — AB (ref 80.0–100.0)
Platelets: 359 10*3/uL (ref 150–440)
RBC: 6.71 MIL/uL — ABNORMAL HIGH (ref 3.80–5.20)
RDW: 22 % — AB (ref 11.5–14.5)
WBC: 13.6 10*3/uL — AB (ref 3.6–11.0)

## 2017-08-10 LAB — BLOOD GAS, ARTERIAL
Acid-Base Excess: 12.3 mmol/L — ABNORMAL HIGH (ref 0.0–2.0)
BICARBONATE: 40.6 mmol/L — AB (ref 20.0–28.0)
FIO2: 0.28
LHR: 14 {breaths}/min
MECHVT: 450 mL
O2 Saturation: 93.7 %
PATIENT TEMPERATURE: 37
PEEP/CPAP: 5 cmH2O
PO2 ART: 69 mmHg — AB (ref 83.0–108.0)
pCO2 arterial: 64 mmHg — ABNORMAL HIGH (ref 32.0–48.0)
pH, Arterial: 7.41 (ref 7.350–7.450)

## 2017-08-10 LAB — GLUCOSE, CAPILLARY
GLUCOSE-CAPILLARY: 202 mg/dL — AB (ref 65–99)
GLUCOSE-CAPILLARY: 221 mg/dL — AB (ref 65–99)
GLUCOSE-CAPILLARY: 139 mg/dL — AB (ref 65–99)
Glucose-Capillary: 140 mg/dL — ABNORMAL HIGH (ref 65–99)
Glucose-Capillary: 142 mg/dL — ABNORMAL HIGH (ref 65–99)

## 2017-08-10 LAB — PROCALCITONIN

## 2017-08-10 MED ORDER — PIPERACILLIN-TAZOBACTAM 3.375 G IVPB
3.3750 g | Freq: Three times a day (TID) | INTRAVENOUS | Status: DC
  Administered 2017-08-10 – 2017-08-13 (×9): 3.375 g via INTRAVENOUS
  Filled 2017-08-10 (×9): qty 50

## 2017-08-10 MED ORDER — SODIUM CHLORIDE 0.9 % IV SOLN
INTRAVENOUS | Status: AC
  Administered 2017-08-10: 11:00:00 via INTRAVENOUS

## 2017-08-10 MED ORDER — INSULIN ASPART 100 UNIT/ML ~~LOC~~ SOLN
2.0000 [IU] | SUBCUTANEOUS | Status: DC
  Administered 2017-08-10 – 2017-08-13 (×22): 2 [IU] via SUBCUTANEOUS
  Filled 2017-08-10 (×21): qty 1

## 2017-08-10 MED ORDER — SERTRALINE HCL 20 MG/ML PO CONC
50.0000 mg | Freq: Every day | ORAL | Status: DC
  Administered 2017-08-10 – 2017-08-13 (×5): 50 mg
  Filled 2017-08-10 (×6): qty 2.5

## 2017-08-10 MED ORDER — ALPRAZOLAM 0.25 MG PO TABS
0.2500 mg | ORAL_TABLET | Freq: Three times a day (TID) | ORAL | Status: DC | PRN
  Administered 2017-08-10: 0.25 mg
  Filled 2017-08-10: qty 1

## 2017-08-10 NOTE — Evaluation (Signed)
Physical Therapy Evaluation Patient Details Name: [name] MRN: [id number] DOB: 15-Oct-1965 Today's Date: 08/10/2017   History of Present Illness  Pt is a 52 y.o. female presenting to hospital 07/29/17 with chest pain and increasing SOB.  Pt admitted to hospital with acute on chronic respiratory failure with hypoxia secondary acute on chronic diastolic CHF, pericardial effusion, pleural effusion, htn, and L sided PNA.  Transferred to CCU 07/30/17 and intubated 07/31/17 (very difficult airway and difficult to wean).  PT order 08/10/17 for ROM; plan for OR extubation 4/2.  PMH includes COPD (4 L home O2), htn, (+) smoking, anxiety.    Clinical Impression  Pt performed AROM BUE and BLE exercises (see exercises for details). Pt was able to follow commands and communicated by nodding her head slightly (yes or no). Pt demonstrates generalized weakness and decreased mobility from extended hospitalization. PT anticipates Pt will benefit from CIR upon discharge from acute hospitalization. However, PT will need to re-evaluate Pt's mobility after planned extubation on 08/14/17; will obtain OT consult post extubation on 08/14/17.    Follow Up Recommendations CIR    Equipment Recommendations  Rolling walker with 5" wheels;3in1 (PT)    Recommendations for Other Services       Precautions / Restrictions Precautions Precautions: Fall Precaution Comments: Mechanical ventilation; gastric tube; HOB >30 degrees Restrictions Weight Bearing Restrictions: No      Mobility  Bed Mobility               General bed mobility comments: Deferred due to intubation in ICU.  Transfers                 General transfer comment: Deferred due to intubation in ICU.  Ambulation/Gait             General Gait Details: Deferred due to intubation in ICU.  Stairs            Wheelchair Mobility    Modified Rankin (Stroke Patients Only)       Balance                                              Pertinent Vitals/Pain Pain Assessment: No/denies pain Pain Score: 0-No pain Pain Intervention(s): Limited activity within patient's tolerance;Monitored during session;Repositioned;Premedicated before session    Home Living Family/patient expects to be discharged to:: Private residence Living Arrangements: Spouse/significant other;Children;Parent Available Help at Discharge: Family;Available 24 hours/day Type of Home: House Home Access: Stairs to enter Entrance Stairs-Rails: Can reach both Entrance Stairs-Number of Steps: 3 Home Layout: One level Home Equipment: Grab bars - tub/shower;Wheelchair - [employer] - 2 wheels Additional Comments: Pt reports that she also has a seat that can be put in the tub    Prior Function Level of Independence: Independent               Hand Dominance   Dominant Hand: Right    Extremity/Trunk Assessment   Upper Extremity Assessment Upper Extremity Assessment: Generalized weakness;RUE deficits/detail;LUE deficits/detail RUE Deficits / Details: Pt demonstrated digits 1-5 AROM flexion/extension WFL, elbow AROM flexion/extension WFL and strength at least 3/5. Unable to fully assess due to intubation and multiple lines and leads in ICU. LUE Deficits / Details: Pt demonstrated digits 1-5 AROM flexion/extension WFL, elbow AROM flexion/extension WFL and strength at least 3/5. Unable to fully assess due to intubation and multiple lines and leads  in ICU.    Lower Extremity Assessment Lower Extremity Assessment: Generalized weakness;RLE deficits/detail;LLE deficits/detail RLE Deficits / Details: Pt demonstrated hip flexion, hip abd/add, knee extension, DF at least 3-/5 demonstrated by good AROM heel slides, SAQ quad contraction, hip abd/add, and ankle pumps. Unable to fully assess due to intubation and multiple lines and leads in ICU. LLE Deficits / Details: Pt demonstrated hip flexion, hip abd/add, knee extension, DF at least  3-/5 demonstrated by good AROM, heel slides, SAQ quad contraction, hip abd/add, and ankle pumps. Unable to fully assess due to intubation and multiple lines and leads in ICU.       Communication   Communication: Other (comment)(Pt currently intubated in ICU; Pt communicated by nodding head yes or no to confirm information provided by daughter )  Cognition Arousal/Alertness: Lethargic;Suspect due to medications Behavior [facility] for tasks assessed/performed Overall Cognitive Status: Within Functional Limits for tasks assessed                                 General Comments: Pt currently intubate in ICU. Pt willing and able to communicate by nodding head.      General Comments  Pt's daughter was present during first part of session and able to provide information. Pt agreeable to session.   Exercises Total Joint Exercises Ankle Circles/Pumps: AROM;Both;10 reps;Supine;Strengthening(HOB elevated) Towel Squeeze: AROM;Strengthening;Both;5 reps;Supine(HOB elevated) Short Arc Quad: AROM;Strengthening;Both;5 reps;Supine(HOB elevated) Heel Slides: AROM;Strengthening;Both;5 reps;Supine(HOB elevated) Hip ABduction/ADduction: AROM;Strengthening;Both;5 reps;Supine(HOB elevated) General Exercises - Upper Extremity Shoulder Flexion: AROM;10 reps;Strengthening;Supine;Right(HOB elevated. AROM 45 degrees R shoulder) Elbow Flexion: AROM;Strengthening;Both;10 reps;Supine(HOB elevated) Digit Composite Flexion: AROM;Strengthening;Both;10 reps(HOB elevated supine)   Assessment/Plan    PT Assessment Patient needs continued PT services  PT Problem List Decreased strength;Decreased activity tolerance;Decreased mobility;Decreased safety awareness;Decreased knowledge of precautions;Cardiopulmonary status limiting activity       PT Treatment Interventions DME instruction;Gait training;Stair training;Functional mobility training;Therapeutic activities;Therapeutic exercise;Balance  training;Patient/family education    PT Goals (Current goals can be found in the Care Plan section)  Acute Rehab PT Goals Patient Stated Goal: I want to get stronger PT Goal Formulation: With patient Time For Goal Achievement: 08/24/17 Potential to Achieve Goals: Fair    Frequency Min 2X/week   Barriers to discharge        Co-evaluation               AM-PAC PT "6 Clicks" Daily Activity  Outcome Measure Difficulty turning over in bed (including adjusting bedclothes, sheets and blankets)?: Unable Difficulty moving from lying on back to sitting on the side of the bed? : Unable Difficulty sitting down on and standing up from a chair with arms (e.g., wheelchair, bedside commode, etc,.)?: Unable Help needed moving to and from a bed to chair (including a wheelchair)?: Total Help needed walking in hospital room?: Total Help needed climbing 3-5 steps with a railing? : Total 6 Click Score: 6    End of Session   Activity Tolerance: No increased pain;Patient tolerated treatment well Patient left: in bed;with call bell/phone within reach;with bed alarm set(B heels elevated by pillows) Nurse Communication: Mobility status PT Visit Diagnosis: Muscle weakness (generalized) (M62.81);Other abnormalities of gait and mobility (R26.89);Difficulty in walking, not elsewhere classified (R26.2)    Time: [id number] PT Time Calculation (min) (ACUTE ONLY): 28 min   Charges:         PT G Codes:         Nashae Maudlin Mondrian-Pardue, SPT 08/10/2017,  5:01 PM

## 2017-08-10 NOTE — Progress Notes (Signed)
Pharmacy Antibiotic Note  [name] is a 52 y.o. female admitted on 07/29/2017 with aspiration pneumonia.  Pharmacy has been consulted for Zosyn dosing.  Plan: Will start Zosyn 3.375g IV q8h   Height: 5' (152.4 cm) Weight: 229 lb 15 oz (104.3 kg) IBW/kg (Calculated) : 45.5  Temp (24hrs), Avg:98.7 F (37.1 C), Min:98.3 F (36.8 C), Max:99 F (37.2 C)  Recent Labs  Lab 08/05/17 0532 08/05/17 0859 08/06/17 0347 08/08/17 0435 08/09/17 0516 08/10/17 0505  WBC 9.9  --   --  12.4*  --  13.6*  CREATININE 0.77 0.70 0.66 0.73 0.89 0.80    Estimated Creatinine Clearance: 88.6 mL/min (by C-G formula based on SCr of 0.8 mg/dL).    No Known Allergies  Antimicrobials this admission: 3/17 Cefepime >> 3/26 3/29 Zosyn >>   Dose adjustments this admission:   Microbiology results: 3/19 UCx: Multiple species present  3/23 Sputum: Rare gram + cocci  3/19 MRSA PCR: negative  Thank you for allowing pharmacy to be a part of this patient's care.  [name], PharmD Pharmacy Resident  08/10/2017 3:50 PM

## 2017-08-10 NOTE — Progress Notes (Signed)
Name: [name] MRN: [id number] DOB: [birth year]     CONSULTATION DATE: 07/29/2017  Subjective: No major issues last night  Objective: Patient remains on the vent and hemodynamically stable.   HISTORY OF PRESENT ILLNESS:    PAST MEDICAL HISTORY :   has a past medical history of Anxiety, COPD (chronic obstructive pulmonary disease) (HCC), Essential hypertension, History of echocardiogram, Morbid obesity (HCC), and Tobacco abuse.  has a past surgical history that includes Cholecystectomy; Abdominal hysterectomy; and Hernia repair. Prior to Admission medications   Medication Sig Start Date End Date Taking? Authorizing Provider  albuterol (PROVENTIL HFA;VENTOLIN HFA) 108 (90 Base) MCG/ACT inhaler Inhale 2 puffs into the lungs every 6 (six) hours as needed for wheezing or shortness of breath. 06/12/17  Yes Gouru, Aruna, MD  amitriptyline (ELAVIL) 10 MG tablet Take 10 mg by mouth at bedtime as needed.  10/13/16 10/13/17 Yes [provider]  amLODipine (NORVASC) 5 MG tablet Take 5 mg by mouth daily. 03/14/17 03/14/18 Yes [provider]  aspirin EC 81 MG tablet Take 81 mg by mouth daily. 05/01/17 01/08/31 Yes [provider]  buPROPion (WELLBUTRIN SR) 100 MG 12 hr tablet Take 100 mg by mouth 2 (two) times daily.   Yes [provider]  clonazePAM (KLONOPIN) 0.5 MG tablet Take 0.5 mg by mouth 3 (three) times daily as needed.    Yes [provider]  cyclobenzaprine (FLEXERIL) 10 MG tablet Take 10 mg by mouth 3 (three) times daily as needed. 08/16/16  Yes [provider]  furosemide (LASIX) 20 MG tablet Take 1 tablet (20 mg total) by mouth 2 (two) times daily. Patient taking differently: Take 20 mg by mouth daily.  06/12/17 06/12/18 Yes Gouru, Aruna, MD  Mometasone Furoate 200 MCG/ACT AERO Inhale 2 puffs into the lungs.   Yes [provider]  naproxen sodium (ALEVE) 220 MG tablet Take 220 mg by mouth daily as needed.   Yes [provider]  nitroGLYCERIN (NITROSTAT) 0.4 MG SL tablet Place 0.4 mg under the tongue as needed. 05/01/17 05/01/18 Yes [provider]  senna-docusate (SENOKOT-S) 8.6-50 MG tablet Take 1 tablet by mouth at bedtime as needed for mild constipation. 06/12/17  Yes Gouru, Deanna Artis, MD   No Known Allergies  FAMILY HISTORY:  family history includes Dementia in her mother; Heart attack in her father. SOCIAL HISTORY:  reports that she has been smoking cigarettes.  She has a 30.00 pack-year smoking history. She has never used smokeless tobacco. She reports that she does not drink alcohol or use drugs.  REVIEW OF SYSTEMS:   Unable to obtain due to critical illness   VITAL SIGNS: Temp:  [98.3 F (36.8 C)-99 F (37.2 C)] 98.7 F (37.1 C) (03/29 0800) Pulse Rate:  [88-123] 96 (03/29 1300) Resp:  [14-28] 25 (03/29 1300) BP: (101-132)/(55-91) 120/75 (03/29 1300) SpO2:  [90 %-93 %] 91 % (03/29 1300) FiO2 (%):  [24 %-40 %] 40 % (03/29 1143) Weight:  [104.3 kg (229 lb 15 oz)] 104.3 kg (229 lb 15 oz) (03/29 0500)  Physical Examination:  Awake in no distress nonfocal neuro exam moving all extremities On the vent no distress, BEA E and no rales S1 + S2 audible no murmur  benign abdomen was febrile for sepsis No leg edema    ASSESSMENT / PLAN:  Acute respiratory failure.  Noted to have a very difficult airway and continue to have negative cough leak test. -Plan per ENT attempt extubation in the OR on 08/14/2017  was possible requirement for tracheostomy.  Prerenal azotemia with hypochloremia, hypercalcemia, hemoconcentration,and intravascular volume depletion was possible over diuresis and secondary to the negative catabolic effect of the steroids -Optimize diuresis/volume, D/C steroids, monitor renal panel and urine output.  Diastolic CHF. -Optimize diuresis to improve lung compliance.  Pneumonia (GPC in sputum).  Bibasilar airspace disease was improved left atelectasis -Empiric  Zosyn.  Monitor CXR + CBC + FiO2.  Pericardial effusion was no evidence of pericardial tamponade.  Hemodynamically stable -Management as per cardiology  Full code  DVT & GI prophylaxis.  Continue supportive care. Family was updated at the bedside and they agree to the plan of care  Critical care time 35 minutes

## 2017-08-10 NOTE — Progress Notes (Signed)
[place] [facility]                                                                                                                                                                                  Patient Demographics   [name], is a 52 y.o. female, DOB - [birth year], MCN:[id number]  Admit date - 07/29/2017   Admitting Physician Vaughan Basta, MD  Outpatient Primary MD for the patient is [name], [name], MD   LOS - 12  Subjective: Patient seen and evaluated today patient on ventilator breathing spontaneously Patient is awake responds to verbal commands .  Review of Systems:   Could not be obtained as patient is on ventilator  Vitals:   Vitals:   08/10/17 1000 08/10/17 1100 08/10/17 1200 08/10/17 1300  BP: 113/67 123/69 119/79 120/75  Pulse: 95 95 (!) 105 96  Resp: 19 (!) 23 19 (!) 25  Temp:      TempSrc:      SpO2: 93% 92% 93% 91%  Weight:      Height:        Wt Readings from Last 3 Encounters:  08/10/17 104.3 kg (229 lb 15 oz)  06/12/17 119 kg (262 lb 6.4 oz)  09/29/16 117.9 kg (260 lb)     Intake/Output Summary (Last 24 hours) at 08/10/2017 1358 Last data filed at 08/10/2017 1000 Gross per 24 hour  Intake 1080.37 ml  Output 925 ml  Net 155.37 ml    Physical Exam:   GENERAL: 52 year old female patient lying on the bed on ventilator FiO2 40% PEEP 5pressure support 10 HEAD, EYES, EARS, NOSE AND THROAT: Atraumatic, normocephalic. Extraocular muscles are intact. Pupils equal and reactive to light. Sclerae anicteric. No conjunctival injection. ET tube noted NECK: Supple. There is no jugular venous distention. No bruits, no lymphadenopathy, no thyromegaly.  HEART: Regular rate and rhythm,. No murmurs, no rubs, no clicks.  LUNGS: Decreased air flow left lung, adequate air flow right lung.  Rales heard in left lung ABDOMEN: Soft, flat, nontender, nondistended. Has good bowel sounds. No hepatosplenomegaly appreciated.   EXTREMITIES: No evidence of any cyanosis, clubbing, or peripheral edema.  +2 pedal and radial pulses bilaterally.  NEUROLOGIC: The patient is alert, awake  On ventilator SKIN: Moist and warm with no rashes appreciated.  Psych: could not be assessed LN: No inguinal LN enlargement    Antibiotics   Anti-infectives (From admission, onward)   Start     Dose/Rate Route Frequency Ordered Stop   07/31/17 0800  ceFEPIme (MAXIPIME) 2 g in sodium chloride 0.9 % 100 mL IVPB     2 g 200 mL/hr over 30 Minutes Intravenous Every  8 hours 07/31/17 0627 08/07/17 0030   07/29/17 1915  ceFEPIme (MAXIPIME) 2 g in sodium chloride 0.9 % 100 mL IVPB     2 g 200 mL/hr over 30 Minutes Intravenous  Once 07/29/17 1911 07/29/17 2050   07/29/17 1915  vancomycin (VANCOCIN) IVPB 1000 mg/200 mL premix     1,000 mg 200 mL/hr over 60 Minutes Intravenous  Once 07/29/17 1911 07/29/17 2342      Medications   Scheduled Meds: . budesonide (PULMICORT) nebulizer solution  0.5 mg Nebulization BID  . chlorhexidine gluconate (MEDLINE KIT)  15 mL Mouth Rinse BID  . docusate  100 mg Per Tube BID  . famotidine  20 mg Per Tube BID  . feeding supplement (PRO-STAT SUGAR FREE 64)  30 mL Per Tube BID  . feeding supplement (VITAL HIGH PROTEIN)  1,000 mL Per Tube Q24H  . free water  100 mL Per Tube Q8H  . heparin  5,000 Units Subcutaneous Q8H  . insulin aspart  0-15 Units Subcutaneous Q4H  . insulin aspart  2 Units Subcutaneous Q4H  . ipratropium-albuterol  3 mL Nebulization Q4H  . mouth rinse  15 mL Mouth Rinse 10 times per day  . methylPREDNISolone (SOLU-MEDROL) injection  40 mg Intravenous Q12H  . multivitamin  15 mL Per Tube Daily  . senna-docusate  1 tablet Oral BID  . sertraline  50 mg Per Tube QHS  . sodium chloride flush  10-40 mL Intracatheter Q12H   Continuous Infusions: . sodium chloride 100 mL/hr at 08/10/17 1122  . fentaNYL infusion INTRAVENOUS Stopped (08/10/17 0832)   PRN Meds:.albuterol, ALPRAZolam,  bisacodyl, fentaNYL, ondansetron (ZOFRAN) IV, sodium chloride flush   Data Review:   Micro Results Recent Results (from the past 240 hour(s))  Culture, respiratory (NON-Expectorated)     Status: None   Collection Time: 07/31/17  2:50 PM  Result Value Ref Range Status   Specimen Description   Final    TRACHEAL ASPIRATE Performed at [facility], [address], [place], [place] [address]    Special Requests   Final    NONE Performed at [facility], [address] [place], [place] [address]    Gram Stain   Final    RARE WBC PRESENT, PREDOMINANTLY PMN RARE GRAM POSITIVE COCCI RARE GRAM POSITIVE RODS    Culture   Final    Consistent with normal respiratory flora. Performed at [facility], [place] [address], [place], [place] [address]    Report Status 08/03/2017 FINAL  Final  Culture, respiratory (NON-Expectorated)     Status: None   Collection Time: 08/04/17 11:53 AM  Result Value Ref Range Status   Specimen Description   Final    TRACHEAL ASPIRATE Performed at [facility], [address], [place], [place] [address]    Special Requests   Final    NONE Performed at [facility], [place], [place] [address]    Gram Stain   Final    RARE WBC PRESENT,BOTH PMN AND MONONUCLEAR RARE SQUAMOUS EPITHELIAL CELLS PRESENT RARE GRAM POSITIVE COCCI    Culture   Final    Consistent with normal respiratory flora. Performed at  [facility], [place] [address] [place], [place] [address]    Report Status 08/06/2017 FINAL  Final    Radiology Reports Dg Chest 2 View  Result Date: 07/30/2017 CLINICAL DATA:  Shortness of breath. Acute on chronic respiratory failure. Hypoxia. Morbid obesity. EXAM: CHEST - 2 VIEW COMPARISON:  Chest x-ray and CT chest 07/29/2017. FINDINGS: Cardiac silhouette is partially silhouetted by large pleural effusion on the LEFT. There is a whiteout of the LEFT hemithorax, which could represent mucous  plugging and or increasing effusion. The RIGHT lung demonstrates interstitial edema with small effusion. IMPRESSION: Marked worsening aeration. Complete opacity of the LEFT hemithorax represents a significant change from yesterday's radiograph. Electronically Signed   By: Staci Righter M.D.   On: 07/30/2017 08:45   Dg Chest 2 View  Result Date: 07/29/2017 CLINICAL DATA:  52 year old female with history of chest pain since last week. Pain most severe during deep breathing. EXAM: CHEST - 2 VIEW COMPARISON:  Chest x-ray 06/10/2017. FINDINGS: Left lower lobe airspace consolidation concerning for pneumonia. Small bilateral pleural effusions. Mild diffuse peribronchial cuffing. Mild cardiomegaly. Upper mediastinal contours appear widened, but are stable compared to prior examinations. IMPRESSION: 1. Left lower lobe pneumonia with small bilateral pleural effusions. Followup PA and lateral chest X-ray is recommended in 3-4 weeks following trial of antibiotic therapy to ensure resolution and exclude underlying malignancy. 2. Mild cardiomegaly. Electronically Signed   By: Vinnie Langton M.D.   On: 07/29/2017 17:37   Dg Abd 1 View  Result Date: 08/07/2017 CLINICAL DATA:  Fecal impaction. EXAM: ABDOMEN - 1 VIEW COMPARISON:  07/31/2017 FINDINGS: Nasogastric tube has its tip in the midportion of the stomach. Clips in the right upper quadrant consistent with previous cholecystectomy. Single clip in the right lower quadrant, possibly appendectomy. Moderate amount of fecal matter within the right colon. No sign of obstruction. IMPRESSION: Nasogastric tube in the mid stomach. Moderate amount of fecal matter in the right colon. No sign of obstruction. Electronically Signed   By: Nelson Chimes M.D.   On: 08/07/2017 11:32   Ct Chest Wo Contrast  Result Date: 07/31/2017 CLINICAL DATA:  She presented to [facility] ER on 03/17 with c/o intermittent chest pain worse during inspiration and nonproductive cough onset 1 week prior to  presentation. On 03/18 pt developed hypotension and worsening acute hypoxic respiratory failure. EXAM: CT CHEST WITHOUT CONTRAST TECHNIQUE: Multidetector CT imaging of the chest was performed following the standard protocol without IV contrast. COMPARISON:  07/29/2017, 06/10/2017 FINDINGS: Cardiovascular: Stable cardiomegaly. Small pericardial effusion. Dilatation of main pulmonary artery as well as the right and left main pulmonary arteries as can be seen with pulmonary arterial hypertension. Thoracic aorta is normal in caliber. Mediastinum/Nodes: Enlarged prevascular lymph node measuring 17 mm in short axis. Multiple other smaller mediastinal lymph nodes are noted. Thyroid gland, trachea, and esophagus demonstrate no significant findings. Lungs/Pleura: Trace right pleural effusion. Small left pleural effusion. Left lower lobe airspace disease with air bronchograms concerning for pneumonia. Improved aeration of left upper lobe. Mild persistent right upper lobe airspace disease likely reflecting atelectasis. Stable 6 mm right middle lobe pulmonary nodule unchanged compared with 09/01/2013. Upper Abdomen: Aortic atherosclerosis. Musculoskeletal: No acute osseous abnormality. No aggressive osseous lesion. IMPRESSION: 1. Left lower lobe airspace disease with air bronchograms concerning for combination of pneumonia and atelectasis. Mild mediastinal lymphadenopathy likely reactive. 2. Small left pleural effusion.  Mild right upper lobe atelectasis. 3.  Aortic Atherosclerosis (ICD10-I70.0). Electronically Signed   By: Kathreen Devoid   On: 07/31/2017 12:47   Ct Angio Chest Pe W And/or Wo Contrast  Result Date: 07/29/2017 CLINICAL DATA:  52 year old female with history of chest pain since last week. Difficulty taking deep breaths. EXAM: CT ANGIOGRAPHY CHEST WITH CONTRAST TECHNIQUE: Multidetector CT imaging of the chest was performed using the standard protocol during  bolus administration of intravenous contrast.  Multiplanar CT image reconstructions and MIPs were obtained to evaluate the vascular anatomy. CONTRAST:  61m ISOVUE-370 IOPAMIDOL (ISOVUE-370) INJECTION 76% COMPARISON:  Chest CT 06/10/2017. FINDINGS: Cardiovascular: No filling defects within the pulmonary arterial tree to suggest underlying pulmonary embolism. Heart size is normal. Moderate volume of pericardial fluid with pericardial enhancement concerning for pericarditis. No pericardial calcification. There is aortic atherosclerosis, as well as atherosclerosis of the great vessels of the mediastinum and the coronary arteries, including calcified atherosclerotic plaque in the left anterior descending coronary artery. Mediastinum/Nodes: Multiple prominent borderline enlarged and enlarged mediastinal lymph nodes, largest of which is in the prevascular nodal station measuring up to 22 mm in short axis, increased compared to the prior study. Esophagus is unremarkable in appearance. No axillary lymphadenopathy. Lungs/Pleura: Moderate left pleural effusion with near complete passive atelectasis of the left lower lobe. Some dependent subsegmental atelectasis is also noted in the left upper lobe. Trace right pleural effusion. Throughout the remaining portions of the lungs there is a background of ground-glass attenuation and mild interlobular septal thickening, indicative of a background of mild interstitial pulmonary edema. 5 mm right middle lobe pulmonary nodule (axial image 47 of series 7), stable dating back to at least 09/01/2013, considered definitively benign. Upper Abdomen: Aortic atherosclerosis. Musculoskeletal: There are no aggressive appearing lytic or blastic lesions noted in the visualized portions of the skeleton. Review of the MIP images confirms the above findings. IMPRESSION: 1. No evidence of pulmonary embolism. 2. Extensive pericardial enhancement with moderate volume of pericardial fluid, indicative of an acute pericarditis. 3. This is associated  with increasing mediastinal lymphadenopathy compared to the prior study. 4. In addition, there is evidence of mild pulmonary edema and bilateral pleural effusions (left greater than right) with extensive areas of passive atelectasis in the left lung. 5. Aortic atherosclerosis, in addition to left anterior descending coronary artery disease. Please note that although the presence of coronary artery calcium documents the presence of coronary artery disease, the severity of this disease and any potential stenosis cannot be assessed on this non-gated CT examination. Assessment for potential risk factor modification, dietary therapy or pharmacologic therapy may be warranted, if clinically indicated. Aortic Atherosclerosis (ICD10-I70.0). Electronically Signed   By: DVinnie LangtonM.D.   On: 07/29/2017 19:45   Dg Chest Port 1 View  Result Date: 08/08/2017 CLINICAL DATA:  Acute respiratory distress. History of acute on chronic respiratory failure, pericardial effusion, COPD, current smoker. EXAM: PORTABLE CHEST 1 VIEW COMPARISON:  Portable chest x-ray of August 07, 2017 FINDINGS: The lungs are well-expanded. Persistent bibasilar interstitial and airspace opacities are present. The cardiac silhouette is enlarged. The pulmonary vascularity is mildly congested. The endotracheal tube tip lies 3.3 cm above the carina. The esophagogastric tube tip projects below the inferior margin of the image. The left internal jugular venous catheter tip projects over the junction of the proximal and midportions of the SVC. IMPRESSION: Bibasilar atelectasis or pneumonia. Mild CHF. The support tubes are in reasonable position. Electronically Signed   By: David  JMartiniqueM.D.   On: 08/08/2017 09:39   Dg Chest Port 1 View  Result Date: 08/07/2017 CLINICAL DATA:  Hypoxia EXAM: PORTABLE CHEST 1 VIEW COMPARISON:  August 05, 2017 FINDINGS: Endotracheal tube tip is 4.0 cm above the carina. Central catheter tip is in superior vena cava.  Nasogastric tube tip and side port are below the diaphragm. No pneumothorax. There are small pleural effusions bilaterally. There is consolidation in the left lower lobe as  well as to a lesser extent in the right base. Heart is enlarged with pulmonary venous hypertension. No adenopathy. No bone lesions. IMPRESSION: Tube and catheter positions as described without pneumothorax. Bibasilar consolidation, more on the left than on the right, likely multifocal pneumonia. A degree of alveolar edema in the bases is possible. There is underlying pulmonary vascular congestion with small pleural effusions bilaterally. Electronically Signed   By: Lowella Grip III M.D.   On: 08/07/2017 07:36   Dg Chest Port 1 View  Result Date: 08/05/2017 CLINICAL DATA:  Acute respiratory failure, COPD, hypertension, smoker EXAM: PORTABLE CHEST 1 VIEW COMPARISON:  Portable exam 0548 hours compared to 08/04/2017 FINDINGS: Tip of endotracheal tube projects 4.1 cm above carina. Nasogastric tube extends into stomach. LEFT subclavian central venous catheter tip projects over SVC. Numerous EKG leads project over chest. Enlargement of cardiac silhouette with pulmonary vascular congestion. Scattered interstitial infiltrates likely representing pulmonary edema. Decreased LEFT pleural effusion. LEFT lower lobe opacification could represent atelectasis or coexistent consolidation. No pneumothorax. IMPRESSION: Persistent pulmonary edema with decreased LEFT pleural effusion. LEFT lower lobe atelectasis versus consolidation. Electronically Signed   By: Lavonia Dana M.D.   On: 08/05/2017 08:11   Dg Chest Port 1 View  Result Date: 08/04/2017 CLINICAL DATA:  Respiratory failure. EXAM: PORTABLE CHEST 1 VIEW COMPARISON:  Yesterday. FINDINGS: Endotracheal tube in satisfactory position. Nasogastric tube extending the stomach. Stable left subclavian catheter. Increased patchy opacity in the perihilar regions bilaterally. No significant change in patchy  density interstitial prominence elsewhere in the right lung. Increased left pleural fluid. Stable enlarged cardiac silhouette. No acute bony abnormality. IMPRESSION: 1. Mild worsening of changes of pulmonary edema. 2. Increased left pleural fluid. 3. Stable cardiomegaly. Electronically Signed   By: Claudie Revering M.D.   On: 08/04/2017 07:05   Dg Chest Port 1 View  Result Date: 08/03/2017 CLINICAL DATA:  Acute respiratory failure. EXAM: PORTABLE CHEST 1 VIEW COMPARISON:  Radiograph yesterday at 0456 hour.  Chest CT 07/31/2016 FINDINGS: Endotracheal tube tip at the thoracic inlet. Enteric tube in place, tip below the diaphragm. Left central line with tip in the proximal SVC. Unchanged cardiomegaly. Unchanged vascular congestion/pulmonary edema. Bibasilar opacities, left greater than right, unchanged. No pneumothorax. IMPRESSION: Unchanged appearance of the chest with cardiomegaly, pulmonary edema/vascular congestion, and bibasilar opacities, left greater than right. Electronically Signed   By: Jeb Levering M.D.   On: 08/03/2017 01:48   Dg Chest Port 1 View  Result Date: 08/02/2017 CLINICAL DATA:  52 year old female with respiratory failure. Left lung pneumonia. EXAM: PORTABLE CHEST 1 VIEW COMPARISON:  08/01/2017, chest CT 07/31/2017, and earlier. FINDINGS: Portable AP semi upright view at 0456 hours. Endotracheal tube tip at the level the clavicles. Enteric tube courses to the abdomen, tip not included. Stable left IJ or subclavian central line. Substantially improved left lung ventilation since 07/31/2017, although residual moderate confluent left lung base opacity remains. Interval increasing opacity at the right lung base. No pneumothorax. Chronic but increased underlying bilateral pulmonary interstitial opacity compared to 2018, possibly mild vascular congestion. Small bilateral pleural effusions are suspected. Stable cardiac size and mediastinal contours. Cardiomegaly and pericardial effusion noted on  chest CT and CTA this month. Paucity of bowel gas in the upper abdomen. IMPRESSION: 1.  Stable lines and tubes. 2. Improved left lung multilobar pneumonia since 07/31/2017, but moderate residual at the lung base. 3. Worsening ventilation at the right lung base over that same time suspicious for right lung involvement now. 4. Stable cardiomegaly.  Mild  vascular congestion/pulmonary edema. 5. Small bilateral pleural effusions suspected. Electronically Signed   By: Genevie Ann M.D.   On: 08/02/2017 11:00   Dg Chest Port 1 View  Result Date: 08/01/2017 CLINICAL DATA:  Respiratory failure EXAM: PORTABLE CHEST 1 VIEW COMPARISON:  Chest radiograph and chest CT July 31, 2017 FINDINGS: Endotracheal tube tip is 3.7 cm above the carina. Central catheter tip is in the superior vena cava. No pneumothorax. There has been significant partial clearing on the left compared to 1 day prior. There has been the significant clearing of diffuse consolidation and effusion. There remains atelectatic change in the left base as well as small left effusion. There has been a clearing of consolidation from the right upper lobe. Currently the right lung is clear except for atelectasis in the left base. There is cardiomegaly with pulmonary venous hypertension. No adenopathy evident. No bone lesions. IMPRESSION: Tube and catheter positions as described without pneumothorax. Significant clearing at the throughout the left lung and right upper lobe. There remains atelectatic change in each lower lung zone as well as underlying pulmonary vascular congestion. There is felt to be small left residual pleural effusion. Electronically Signed   By: Lowella Grip III M.D.   On: 08/01/2017 07:56   Dg Chest Port 1 View  Result Date: 07/31/2017 CLINICAL DATA:  Intubation and central line placement. EXAM: PORTABLE CHEST 1 VIEW COMPARISON:  Chest x-ray from same day at 0529. FINDINGS: Interval placement of an endotracheal tube with the tip approximately  1 cm above the level of the carina. Enteric tube seen entering the stomach. Left subclavian central venous catheter with the tip at the cavoatrial junction. Stable cardiomegaly. Unchanged complete opacification of the left hemithorax due to large pleural effusion and left lung collapse. Increasing airspace disease within the right upper lobe. No acute osseous abnormality. IMPRESSION: 1. Interval placement of an endotracheal tube with the tip approximately 1 cm above the level of the carina. Recommend retraction 2-3 cm. 2. Appropriately positioned left subclavian central venous catheter. 3. Unchanged large left pleural effusion and left lung collapse. 4. Worsening airspace disease in the right upper lobe which could reflect edema or infection. Electronically Signed   By: Titus Dubin M.D.   On: 07/31/2017 10:51   Dg Chest Port 1 View  Result Date: 07/31/2017 CLINICAL DATA:  Acute respiratory failure EXAM: PORTABLE CHEST 1 VIEW COMPARISON:  07/30/2017 FINDINGS: Dense consolidation of the left hemithorax unchanged due to effusion and collapse. Progression of right lower lobe atelectasis/infiltrate. Diffuse airspace disease on the right may represent edema. IMPRESSION: Persistent collapse of the left lung Progressive right lower lobe atelectasis/infiltrate and small right effusion. Electronically Signed   By: Franchot Gallo M.D.   On: 07/31/2017 07:26   Dg Abd Portable 1v  Result Date: 07/31/2017 CLINICAL DATA:  NG tube placement. EXAM: PORTABLE ABDOMEN - 1 VIEW COMPARISON:  None. FINDINGS: Enteric tube in place with the tip in the distal gastric body. Nonobstructive bowel gas pattern. No acute osseous abnormality. IMPRESSION: Enteric tube appropriately positioned with the tip in the distal gastric body. Electronically Signed   By: Titus Dubin M.D.   On: 07/31/2017 10:52     CBC Recent Labs  Lab 08/05/17 0532 08/08/17 0435 08/10/17 0505  WBC 9.9 12.4* 13.6*  HGB 13.6 14.8 16.5*  HCT 44.3 48.3*  52.7*  PLT 375 426 359  MCV 78.9* 77.4* 78.6*  MCH 24.3* 23.7* 24.7*  MCHC 30.8* 30.6* 31.4*  RDW 21.0* 21.8* 22.0*  LYMPHSABS  --  1.2  --   MONOABS  --  0.7  --   EOSABS  --  0.0  --   BASOSABS  --  0.0  --     Chemistries  Recent Labs  Lab 08/04/17 0630  08/05/17 0532 08/05/17 0859 08/06/17 0347 08/08/17 0435 08/09/17 0516 08/10/17 0505  NA 141  --  140 139 141 138 142 143  K 5.4*   < > 4.8 4.7 4.9 5.0 4.4 4.4  CL 98*  --  95* 91* 93* 93* 95* 98*  CO2 37*  --  37* 37* 38* 34* 35* 34*  GLUCOSE 151*  --  174* 198* 172* 148* 104* 148*  BUN 36*  --  44* 43* 46* 54* 60* 70*  CREATININE 0.68  --  0.77 0.70 0.66 0.73 0.89 0.80  CALCIUM 9.2  --  9.2 9.5 9.8 10.5* 11.1* 10.8*  MG 2.3  --  2.3  --  2.3 2.8*  --   --    < > = values in this interval not displayed.   ------------------------------------------------------------------------------------------------------------------ estimated creatinine clearance is 88.6 mL/min (by C-G formula based on SCr of 0.8 mg/dL). ------------------------------------------------------------------------------------------------------------------ No results for input(s): HGBA1C in the last 72 hours. ------------------------------------------------------------------------------------------------------------------ No results for input(s): CHOL, HDL, LDLCALC, TRIG, CHOLHDL, LDLDIRECT in the last 72 hours. ------------------------------------------------------------------------------------------------------------------ No results for input(s): TSH, T4TOTAL, T3FREE, THYROIDAB in the last 72 hours.  Invalid input(s): FREET3 ------------------------------------------------------------------------------------------------------------------ No results for input(s): VITAMINB12, FOLATE, FERRITIN, TIBC, IRON, RETICCTPCT in the last 72 hours.  Coagulation profile No results for input(s): INR, PROTIME in the last 168 hours.  No results for input(s): DDIMER  in the last 72 hours.  Cardiac Enzymes No results for input(s): CKMB, TROPONINI, MYOGLOBIN in the last 168 hours.  Invalid input(s): CK ------------------------------------------------------------------------------------------------------------------ Invalid input(s): POCBNP    Assessment & Plan  52 year old female patient currently in ICU for acute on chronic respiratory failure, pericardial effusion and heart failure exacerbation.  1. Acute on chronic respiratory failure with hypoxia On ventilator breathing spontaneouly Off sedation Spontaneous breathing trials Extubation on Tuesday in OR with ENT at bedside is the plan Status post bronchoscopy no endobronchial tumors, mass and foreign body Appreciate intensivist management  2. Acute on chronic diastolic heart failure Lasix as needed for diuresis  3.  Left lung pneumonia versus pericarditis  Opacification of left hemithorax secondary to atelectasis and effusion  4.Pericardial effusion with no evidence of pericardial tamponade Cardiology follow-up appreciated  5.Nutrition tube feeding to continue  6.  DVT prophylaxis with subcu heparin 5000 units every 8 hourly      Code Status Orders  (From admission, onward)        Start     Ordered   07/29/17 2224  Full code  Continuous     07/29/17 2223    Code Status History    Date Active Date Inactive Code Status Order ID Comments User Context   06/07/2017 1347 06/12/2017 2013 Full Code [id number]  Bettey Costa, MD Inpatient      Time Spent in minutes   35 minutes  Greater than 50% of time spent in care coordination and counseling patient regarding the condition and plan of care.   Saundra Shelling M.D on 08/10/2017 at 1:58 PM  Between 7am to 6pm - Pager - [phone]  After 6pm go to [email or web address] - Proofreader  [employer]   Office  [phone]

## 2017-08-10 NOTE — Progress Notes (Signed)
No cuff leak heard.

## 2017-08-10 NOTE — Progress Notes (Signed)
SUBJECTIVE: remains intubated but appears to be hemodynamically stable and and no new events overnight.   Vitals:   08/10/17 0400 08/10/17 0407 08/10/17 0500 08/10/17 0600  BP: 111/68  111/66 106/61  Pulse: 99 95 94 94  Resp: 17 19 16 16   Temp: 98.9 F (37.2 C)     TempSrc: Oral     SpO2: 91% 90% 90% 90%  Weight:   229 lb 15 oz (104.3 kg)   Height:        Intake/Output Summary (Last 24 hours) at 08/10/2017 0830 Last data filed at 08/10/2017 0600 Gross per 24 hour  Intake 954.04 ml  Output 1275 ml  Net -320.96 ml    LABS: Basic Metabolic Panel: Recent Labs    08/08/17 0435 08/09/17 0516 08/10/17 0505  NA 138 142 143  K 5.0 4.4 4.4  CL 93* 95* 98*  CO2 34* 35* 34*  GLUCOSE 148* 104* 148*  BUN 54* 60* 70*  CREATININE 0.73 0.89 0.80  CALCIUM 10.5* 11.1* 10.8*  MG 2.8*  --   --   PHOS 4.1 3.9  --    Liver Function Tests: No results for input(s): AST, ALT, ALKPHOS, BILITOT, PROT, ALBUMIN in the last 72 hours. No results for input(s): LIPASE, AMYLASE in the last 72 hours. CBC: Recent Labs    08/08/17 0435 08/10/17 0505  WBC 12.4* 13.6*  NEUTROABS 10.5*  --   HGB 14.8 16.5*  HCT 48.3* 52.7*  MCV 77.4* 78.6*  PLT 426 359   Cardiac Enzymes: No results for input(s): CKTOTAL, CKMB, CKMBINDEX, TROPONINI in the last 72 hours. BNP: Invalid input(s): POCBNP D-Dimer: No results for input(s): DDIMER in the last 72 hours. Hemoglobin A1C: No results for input(s): HGBA1C in the last 72 hours. Fasting Lipid Panel: No results for input(s): CHOL, HDL, LDLCALC, TRIG, CHOLHDL, LDLDIRECT in the last 72 hours. Thyroid Function Tests: No results for input(s): TSH, T4TOTAL, T3FREE, THYROIDAB in the last 72 hours.  Invalid input(s): FREET3 Anemia Panel: No results for input(s): VITAMINB12, FOLATE, FERRITIN, TIBC, IRON, RETICCTPCT in the last 72 hours.   PHYSICAL EXAM General: Well developed, well nourished, in no acute distress HEENT:  Normocephalic and atramatic Neck:   No JVD.  Lungs: Clear bilaterally to auscultation and percussion. Heart: HRRR . Normal S1 and S2 without gallops or murmurs.  Abdomen: Bowel sounds are positive, abdomen soft and non-tender  Msk:  Back normal, normal gait. Normal strength and tone for age. Extremities: No clubbing, cyanosis or edema.   Neuro: Alert and oriented X 3. Psych:  Good affect, responds appropriately  TELEMETRY: sinus rhythm  ASSESSMENT AND PLAN: small to moderate pericardial effusion without hemodynamic compromise and respiratory failure remains intubated. Hemodynamically stable.  Principal Problem:   Acute on chronic respiratory failure with hypoxia (HCC) Active Problems:   Acute respiratory failure (HCC)   Pericardial effusion    Kalaysia Demonbreun A, MD, [facility] 08/10/2017 8:30 AM

## 2017-08-10 NOTE — Progress Notes (Signed)
Inpatient Diabetes Program Recommendations  AACE/ADA: New Consensus Statement on Inpatient Glycemic Control (2015)  Target Ranges:  Prepandial:   less than 140 mg/dL      Peak postprandial:   less than 180 mg/dL (1-2 hours)      Critically ill patients:  140 - 180 mg/dL   Results for Angel MeekerHOMAS, Josetta D (MRN [id number]) as of 08/10/2017 11:47  Ref. Range 08/09/2017 07:17 08/09/2017 11:20 08/09/2017 16:34 08/09/2017 20:10 08/09/2017 23:26 08/10/2017 04:18 08/10/2017 07:39 08/10/2017 11:30  Glucose-Capillary Latest Ref Range: 65 - 99 mg/dL 409119 (H) 811185 (H) 914134 (H) 162 (H) 218 (H) 140 (H) 202 (H) 221 (H)   Review of Glycemic Control  Diabetes history: No Outpatient Diabetes medications: NA Current orders for Inpatient glycemic control: Novolog 0-15 units Q4H; Solumedrol 40 mg Q12H; Vital @ 40 ml/hr  Inpatient Diabetes Program Recommendations:  Insulin - Meal Coverage: Please consider ordering Novolog 2 units Q4H for tube feeding coverage (in addition to Novolog correction). If tube feeding is held or stopped then Novolog tube feeding coverage should also be held or stopped.  Thanks, Orlando PennerMarie Akshar Starnes, RN, MSN, CDE Diabetes Coordinator Inpatient Diabetes Program [phone] (Team Pager from 8am to 5pm)

## 2017-08-10 NOTE — Progress Notes (Signed)
Pt has remained alert, FC, with good motor strength. Pt has remained on spontaneous 10/5 since early am-inline secretions have increased with a tan, pink-tinged consistency. CXR ordered, sputum cx ordered, WBC increase, baseline procalcitronin add on to am collection, afebrile. 1L ordered for increased BUN. Pt has a strong cough, although she has c/o esophogeal pain from EET->fentanyl restarted at 25mcg for comfort. Pt has remained in NSR, BP WNL.  Plans remain for OR extubation on Tue.

## 2017-08-11 ENCOUNTER — Inpatient Hospital Stay: Payer: Medicaid Other

## 2017-08-11 LAB — GLUCOSE, CAPILLARY
GLUCOSE-CAPILLARY: 134 mg/dL — AB (ref 65–99)
GLUCOSE-CAPILLARY: 136 mg/dL — AB (ref 65–99)
GLUCOSE-CAPILLARY: 166 mg/dL — AB (ref 65–99)
GLUCOSE-CAPILLARY: 175 mg/dL — AB (ref 65–99)
Glucose-Capillary: 149 mg/dL — ABNORMAL HIGH (ref 65–99)
Glucose-Capillary: 143 mg/dL — ABNORMAL HIGH (ref 65–99)
Glucose-Capillary: 153 mg/dL — ABNORMAL HIGH (ref 65–99)

## 2017-08-11 LAB — MAGNESIUM: MAGNESIUM: 2 mg/dL (ref 1.7–2.4)

## 2017-08-11 LAB — BASIC METABOLIC PANEL
Anion gap: 12 (ref 5–15)
BUN: 52 mg/dL — AB (ref 6–20)
CALCIUM: 10.4 mg/dL — AB (ref 8.9–10.3)
CO2: 34 mmol/L — ABNORMAL HIGH (ref 22–32)
CREATININE: 0.67 mg/dL (ref 0.44–1.00)
Chloride: 102 mmol/L (ref 101–111)
GFR calc Af Amer: >60 mL/min (ref >60)
GLUCOSE: 145 mg/dL — AB (ref 65–99)
Potassium: 3.9 mmol/L (ref 3.5–5.1)
SODIUM: 148 mmol/L — AB (ref 135–145)

## 2017-08-11 LAB — CBC WITH DIFFERENTIAL/PLATELET
BASOS ABS: 0.1 10*3/uL (ref 0–0.1)
BASOS PCT: 0 %
EOS ABS: 0.1 10*3/uL (ref 0–0.7)
EOS PCT: 1 %
HCT: 53.6 % — ABNORMAL HIGH (ref 35.0–47.0)
Hemoglobin: 15.9 g/dL (ref 12.0–16.0)
Lymphocytes Relative: 12 %
Lymphs Abs: 1.5 10*3/uL (ref 1.0–3.6)
MCH: 23.6 pg — ABNORMAL LOW (ref 26.0–34.0)
MCHC: 29.6 g/dL — ABNORMAL LOW (ref 32.0–36.0)
MCV: 79.8 fL — ABNORMAL LOW (ref 80.0–100.0)
MONO ABS: 1.4 10*3/uL — AB (ref 0.2–0.9)
Monocytes Relative: 12 %
Neutro Abs: 8.8 10*3/uL — ABNORMAL HIGH (ref 1.4–6.5)
Neutrophils Relative %: 75 %
PLATELETS: 337 10*3/uL (ref 150–440)
RBC: 6.71 MIL/uL — AB (ref 3.80–5.20)
RDW: 21.9 % — AB (ref 11.5–14.5)
WBC: 11.8 10*3/uL — AB (ref 3.6–11.0)

## 2017-08-11 LAB — PHOSPHORUS: Phosphorus: 3.7 mg/dL (ref 2.5–4.6)

## 2017-08-11 LAB — PROCALCITONIN

## 2017-08-11 MED ORDER — FREE WATER
100.0000 mL | Status: DC
  Administered 2017-08-11 – 2017-08-13 (×14): 100 mL

## 2017-08-11 MED ORDER — METOCLOPRAMIDE HCL 5 MG/ML IJ SOLN
5.0000 mg | Freq: Two times a day (BID) | INTRAMUSCULAR | Status: DC
  Administered 2017-08-11 – 2017-08-17 (×13): 5 mg via INTRAVENOUS
  Filled 2017-08-11 (×13): qty 2

## 2017-08-11 MED ORDER — LORAZEPAM 2 MG/ML IJ SOLN
2.0000 mg | Freq: Once | INTRAMUSCULAR | Status: AC
  Administered 2017-08-11: 2 mg via INTRAVENOUS
  Filled 2017-08-11: qty 1

## 2017-08-11 NOTE — Progress Notes (Signed)
Patient stated that her stomach is hurting. Spoke with Dr Duanne LimerickSamaan and orders to stop tube feeds. Pepcid given this morn. Asked patient if she wanted bedpan, she stated no. Refused stool softeners. Patient requested to remove her NG tube. Dr. Duanne LimerickSamaan aware. He is to go and speak with patient. He has ordered me to restart fentanyl.

## 2017-08-11 NOTE — Progress Notes (Signed)
[name] at [facility]                                                                                                                                                                                  Patient Demographics   [name], is a 52 y.o. female, DOB - [birth year], UQJ:[id number]  Admit date - 07/29/2017   Admitting Physician Vaughan Basta, MD  Outpatient Primary MD for the patient is [name], [name], MD   LOS - 13  Subjective: Patient seen and evaluated today patient on ventilator breathing spontaneously Patient is awake responds to verbal commands Patient had abdominal pain so tube feedings are stopped patient complains of fullness in the stomach, bloating of the abdomen..  Also has constipation but refusing stool softeners.  Review of Systems:   Could not be obtained as patient is on ventilator but patient alert, able to communicate with family with lipmovement  Vitals:   Vitals:   08/11/17 0900 08/11/17 1000 08/11/17 1100 08/11/17 1200  BP: 116/78 124/75 118/66 116/76  Pulse: 97 97 (!) 102 (!) 112  Resp: 20 19 (!) 21 16  Temp:      TempSrc:      SpO2: 93% 94% 95% 93%  Weight:      Height:        Wt Readings from Last 3 Encounters:  08/11/17 105.1 kg (231 lb 11.3 oz)  06/12/17 119 kg (262 lb 6.4 oz)  09/29/16 117.9 kg (260 lb)     Intake/Output Summary (Last 24 hours) at 08/11/2017 1309 Last data filed at 08/11/2017 0948 Gross per 24 hour  Intake 1642.42 ml  Output 1925 ml  Net -282.58 ml    Physical Exam:   GENERAL: 52 year old female patient lying on the bed on ventilator FiO2 40% PEEP 5pressure support 10 HEAD, EYES, EARS, NOSE AND THROAT: Atraumatic, normocephalic. Extraocular muscles are intact. Pupils equal and reactive to light. Sclerae anicteric. No conjunctival injection. ET tube noted NECK: Supple. There is no jugular venous distention. No bruits, no lymphadenopathy, no thyromegaly.  HEART: Regular  rate and rhythm,. No murmurs, no rubs, no clicks.  LUNGS: Decreased air flow left lung, adequate air flow right lung.  Rales heard in left lung ABDOMEN: Soft, flat, nontender, nondistended. Has good bowel sounds. No hepatosplenomegaly appreciated.  EXTREMITIES: No evidence of any cyanosis, clubbing, or peripheral edema.  +2 pedal and radial pulses bilaterally.  NEUROLOGIC: The patient is alert, awake  On ventilator SKIN: Moist and warm with no rashes appreciated.  Psych: could not be assessed LN: No inguinal LN enlargement    Antibiotics   Anti-infectives (From admission, onward)   Start  Dose/Rate Route Frequency Ordered Stop   08/10/17 1600  piperacillin-tazobactam (ZOSYN) IVPB 3.375 g     3.375 g 12.5 mL/hr over 240 Minutes Intravenous Every 8 hours 08/10/17 1550     07/31/17 0800  ceFEPIme (MAXIPIME) 2 g in sodium chloride 0.9 % 100 mL IVPB     2 g 200 mL/hr over 30 Minutes Intravenous Every 8 hours 07/31/17 0627 08/07/17 0030   07/29/17 1915  ceFEPIme (MAXIPIME) 2 g in sodium chloride 0.9 % 100 mL IVPB     2 g 200 mL/hr over 30 Minutes Intravenous  Once 07/29/17 1911 07/29/17 2050   07/29/17 1915  vancomycin (VANCOCIN) IVPB 1000 mg/200 mL premix     1,000 mg 200 mL/hr over 60 Minutes Intravenous  Once 07/29/17 1911 07/29/17 2342      Medications   Scheduled Meds: . budesonide (PULMICORT) nebulizer solution  0.5 mg Nebulization BID  . chlorhexidine gluconate (MEDLINE KIT)  15 mL Mouth Rinse BID  . docusate  100 mg Per Tube BID  . famotidine  20 mg Per Tube BID  . feeding supplement (PRO-STAT SUGAR FREE 64)  30 mL Per Tube BID  . feeding supplement (VITAL HIGH PROTEIN)  1,000 mL Per Tube Q24H  . free water  100 mL Per Tube Q4H  . heparin  5,000 Units Subcutaneous Q8H  . insulin aspart  0-15 Units Subcutaneous Q4H  . insulin aspart  2 Units Subcutaneous Q4H  . ipratropium-albuterol  3 mL Nebulization Q4H  . mouth rinse  15 mL Mouth Rinse 10 times per day  .  metoCLOPramide (REGLAN) injection  5 mg Intravenous Q12H  . multivitamin  15 mL Per Tube Daily  . senna-docusate  1 tablet Oral BID  . sertraline  50 mg Per Tube QHS  . sodium chloride flush  10-40 mL Intracatheter Q12H   Continuous Infusions: . fentaNYL infusion INTRAVENOUS 25 mcg/hr (08/11/17 1116)  . piperacillin-tazobactam (ZOSYN)  IV 3.375 g (08/11/17 1304)   PRN Meds:.albuterol, ALPRAZolam, bisacodyl, fentaNYL, ondansetron (ZOFRAN) IV, sodium chloride flush   Data Review:   Micro Results Recent Results (from the past 240 hour(s))  Culture, respiratory (NON-Expectorated)     Status: None   Collection Time: 08/04/17 11:53 AM  Result Value Ref Range Status   Specimen Description   Final    TRACHEAL ASPIRATE Performed at [facility], [address], [place], [place] [address]    Special Requests   Final    NONE Performed at [facility], [place], [place] [address]    Gram Stain   Final    RARE WBC PRESENT,BOTH PMN AND MONONUCLEAR RARE SQUAMOUS EPITHELIAL CELLS PRESENT RARE GRAM POSITIVE COCCI    Culture   Final    Consistent with normal respiratory flora. Performed at [facility], [place] [address], [place], [place] [address]    Report Status 08/06/2017 FINAL  Final  Culture, respiratory (NON-Expectorated)     Status: None (Preliminary result)   Collection Time: 08/10/17  2:32 PM  Result Value Ref Range Status   Specimen Description TRACHEAL ASPIRATE  Final   Special Requests   Final    NONE Performed at [facility], [address] [place], [place] [address]    Gram Stain   Final    ABUNDANT WBC PRESENT, PREDOMINANTLY PMN RARE SQUAMOUS EPITHELIAL CELLS PRESENT MODERATE GRAM POSITIVE COCCI IN CHAINS    Culture   Final    CULTURE REINCUBATED FOR BETTER GROWTH Performed  at [facility], [place] [address] [place], [place] [address]    Report Status PENDING  Incomplete    Radiology Reports Dg Chest 2  View  Result Date: 07/30/2017 CLINICAL DATA:  Shortness of breath. Acute on chronic respiratory failure. Hypoxia. Morbid obesity. EXAM: CHEST - 2 VIEW COMPARISON:  Chest x-ray and CT chest 07/29/2017. FINDINGS: Cardiac silhouette is partially silhouetted by large pleural effusion on the LEFT. There is a whiteout of the LEFT hemithorax, which could represent mucous plugging and or increasing effusion. The RIGHT lung demonstrates interstitial edema with small effusion. IMPRESSION: Marked worsening aeration. Complete opacity of the LEFT hemithorax represents a significant change from yesterday's radiograph. Electronically Signed   By: Staci Righter M.D.   On: 07/30/2017 08:45   Dg Chest 2 View  Result Date: 07/29/2017 CLINICAL DATA:  52 year old female with history of chest pain since last week. Pain most severe during deep breathing. EXAM: CHEST - 2 VIEW COMPARISON:  Chest x-ray 06/10/2017. FINDINGS: Left lower lobe airspace consolidation concerning for pneumonia. Small bilateral pleural effusions. Mild diffuse peribronchial cuffing. Mild cardiomegaly. Upper mediastinal contours appear widened, but are stable compared to prior examinations. IMPRESSION: 1. Left lower lobe pneumonia with small bilateral pleural effusions. Followup PA and lateral chest X-ray is recommended in 3-4 weeks following trial of antibiotic therapy to ensure resolution and exclude underlying malignancy. 2. Mild cardiomegaly. Electronically Signed   By: Vinnie Langton M.D.   On: 07/29/2017 17:37   Dg Abd 1 View  Result Date: 08/07/2017 CLINICAL DATA:  Fecal impaction. EXAM: ABDOMEN - 1 VIEW COMPARISON:  07/31/2017 FINDINGS: Nasogastric tube has its tip in the midportion of the stomach. Clips in the right upper quadrant consistent with previous cholecystectomy. Single clip in the right lower quadrant, possibly appendectomy. Moderate amount of fecal matter within the right colon. No sign of obstruction. IMPRESSION: Nasogastric tube in  the mid stomach. Moderate amount of fecal matter in the right colon. No sign of obstruction. Electronically Signed   By: Nelson Chimes M.D.   On: 08/07/2017 11:32   Ct Chest Wo Contrast  Result Date: 07/31/2017 CLINICAL DATA:  She presented to [facility] ER on 03/17 with c/o intermittent chest pain worse during inspiration and nonproductive cough onset 1 week prior to presentation. On 03/18 pt developed hypotension and worsening acute hypoxic respiratory failure. EXAM: CT CHEST WITHOUT CONTRAST TECHNIQUE: Multidetector CT imaging of the chest was performed following the standard protocol without IV contrast. COMPARISON:  07/29/2017, 06/10/2017 FINDINGS: Cardiovascular: Stable cardiomegaly. Small pericardial effusion. Dilatation of main pulmonary artery as well as the right and left main pulmonary arteries as can be seen with pulmonary arterial hypertension. Thoracic aorta is normal in caliber. Mediastinum/Nodes: Enlarged prevascular lymph node measuring 17 mm in short axis. Multiple other smaller mediastinal lymph nodes are noted. Thyroid gland, trachea, and esophagus demonstrate no significant findings. Lungs/Pleura: Trace right pleural effusion. Small left pleural effusion. Left lower lobe airspace disease with air bronchograms concerning for pneumonia. Improved aeration of left upper lobe. Mild persistent right upper lobe airspace disease likely reflecting atelectasis. Stable 6 mm right middle lobe pulmonary nodule unchanged compared with 09/01/2013. Upper Abdomen: Aortic atherosclerosis. Musculoskeletal: No acute osseous abnormality. No aggressive osseous lesion. IMPRESSION: 1. Left lower lobe airspace disease with air bronchograms concerning for combination of pneumonia and atelectasis. Mild mediastinal lymphadenopathy likely reactive. 2. Small left pleural effusion.  Mild right upper lobe atelectasis. 3.  Aortic Atherosclerosis (ICD10-I70.0). Electronically Signed   By: Kathreen Devoid  On: 07/31/2017 12:47   Ct  Angio Chest Pe W And/or Wo Contrast  Result Date: 07/29/2017 CLINICAL DATA:  52 year old female with history of chest pain since last week. Difficulty taking deep breaths. EXAM: CT ANGIOGRAPHY CHEST WITH CONTRAST TECHNIQUE: Multidetector CT imaging of the chest was performed using the standard protocol during bolus administration of intravenous contrast. Multiplanar CT image reconstructions and MIPs were obtained to evaluate the vascular anatomy. CONTRAST:  68m ISOVUE-370 IOPAMIDOL (ISOVUE-370) INJECTION 76% COMPARISON:  Chest CT 06/10/2017. FINDINGS: Cardiovascular: No filling defects within the pulmonary arterial tree to suggest underlying pulmonary embolism. Heart size is normal. Moderate volume of pericardial fluid with pericardial enhancement concerning for pericarditis. No pericardial calcification. There is aortic atherosclerosis, as well as atherosclerosis of the great vessels of the mediastinum and the coronary arteries, including calcified atherosclerotic plaque in the left anterior descending coronary artery. Mediastinum/Nodes: Multiple prominent borderline enlarged and enlarged mediastinal lymph nodes, largest of which is in the prevascular nodal station measuring up to 22 mm in short axis, increased compared to the prior study. Esophagus is unremarkable in appearance. No axillary lymphadenopathy. Lungs/Pleura: Moderate left pleural effusion with near complete passive atelectasis of the left lower lobe. Some dependent subsegmental atelectasis is also noted in the left upper lobe. Trace right pleural effusion. Throughout the remaining portions of the lungs there is a background of ground-glass attenuation and mild interlobular septal thickening, indicative of a background of mild interstitial pulmonary edema. 5 mm right middle lobe pulmonary nodule (axial image 47 of series 7), stable dating back to at least 09/01/2013, considered definitively benign. Upper Abdomen: Aortic atherosclerosis.  Musculoskeletal: There are no aggressive appearing lytic or blastic lesions noted in the visualized portions of the skeleton. Review of the MIP images confirms the above findings. IMPRESSION: 1. No evidence of pulmonary embolism. 2. Extensive pericardial enhancement with moderate volume of pericardial fluid, indicative of an acute pericarditis. 3. This is associated with increasing mediastinal lymphadenopathy compared to the prior study. 4. In addition, there is evidence of mild pulmonary edema and bilateral pleural effusions (left greater than right) with extensive areas of passive atelectasis in the left lung. 5. Aortic atherosclerosis, in addition to left anterior descending coronary artery disease. Please note that although the presence of coronary artery calcium documents the presence of coronary artery disease, the severity of this disease and any potential stenosis cannot be assessed on this non-gated CT examination. Assessment for potential risk factor modification, dietary therapy or pharmacologic therapy may be warranted, if clinically indicated. Aortic Atherosclerosis (ICD10-I70.0). Electronically Signed   By: DVinnie LangtonM.D.   On: 07/29/2017 19:45   Dg Chest Port 1 View  Result Date: 08/11/2017 CLINICAL DATA:  Pneumonia EXAM: PORTABLE CHEST 1 VIEW COMPARISON:  Yesterday FINDINGS: Endotracheal tube tip at the clavicular heads. Left subclavian central line with tip at the distal SVC. An orogastric tube reaches the stomach. Indistinct opacities at the lung bases. Generalized interstitial coarsening that is stable. Unchanged cardiopericardial enlargement. IMPRESSION: 1. Stable positioning of tubes and central line. 2. History of pneumonia with unchanged lung opacity. 3. Cardiopericardial enlargement. Pericardial effusion by recent chest CT. Electronically Signed   By: JMonte FantasiaM.D.   On: 08/11/2017 07:45   Dg Chest Port 1 View  Result Date: 08/10/2017 CLINICAL DATA:  Rhonchi, acute on  chronic respiratory failure with hypoxia, heart failure, pericardial effusion, pneumonia versus pericarditis, COPD EXAM: PORTABLE CHEST 1 VIEW COMPARISON:  Portable exam 1445 hours compared to 08/08/2017 FINDINGS: Tip of endotracheal tube projects  3.0 cm above carina. Nasogastric tube extends into stomach. LEFT subclavian central venous catheter tip projects over SVC. Enlargement of cardiac silhouette with vascular congestion. Increased RIGHT lung infiltrate. Questionable LEFT lower lobe atelectasis versus consolidation. No gross pleural effusion or pneumothorax. IMPRESSION: Increased interstitial infiltrates in RIGHT lung with questionable atelectasis versus infiltrate in LEFT lower lobe. Enlargement of cardiac silhouette with pulmonary vascular congestion. Sign report Electronically Signed   By: Lavonia Dana M.D.   On: 08/10/2017 15:08   Dg Chest Port 1 View  Result Date: 08/08/2017 CLINICAL DATA:  Acute respiratory distress. History of acute on chronic respiratory failure, pericardial effusion, COPD, current smoker. EXAM: PORTABLE CHEST 1 VIEW COMPARISON:  Portable chest x-ray of August 07, 2017 FINDINGS: The lungs are well-expanded. Persistent bibasilar interstitial and airspace opacities are present. The cardiac silhouette is enlarged. The pulmonary vascularity is mildly congested. The endotracheal tube tip lies 3.3 cm above the carina. The esophagogastric tube tip projects below the inferior margin of the image. The left internal jugular venous catheter tip projects over the junction of the proximal and midportions of the SVC. IMPRESSION: Bibasilar atelectasis or pneumonia. Mild CHF. The support tubes are in reasonable position. Electronically Signed   By: David  Martinique M.D.   On: 08/08/2017 09:39   Dg Chest Port 1 View  Result Date: 08/07/2017 CLINICAL DATA:  Hypoxia EXAM: PORTABLE CHEST 1 VIEW COMPARISON:  August 05, 2017 FINDINGS: Endotracheal tube tip is 4.0 cm above the carina. Central catheter tip  is in superior vena cava. Nasogastric tube tip and side port are below the diaphragm. No pneumothorax. There are small pleural effusions bilaterally. There is consolidation in the left lower lobe as well as to a lesser extent in the right base. Heart is enlarged with pulmonary venous hypertension. No adenopathy. No bone lesions. IMPRESSION: Tube and catheter positions as described without pneumothorax. Bibasilar consolidation, more on the left than on the right, likely multifocal pneumonia. A degree of alveolar edema in the bases is possible. There is underlying pulmonary vascular congestion with small pleural effusions bilaterally. Electronically Signed   By: Lowella Grip III M.D.   On: 08/07/2017 07:36   Dg Chest Port 1 View  Result Date: 08/05/2017 CLINICAL DATA:  Acute respiratory failure, COPD, hypertension, smoker EXAM: PORTABLE CHEST 1 VIEW COMPARISON:  Portable exam 0548 hours compared to 08/04/2017 FINDINGS: Tip of endotracheal tube projects 4.1 cm above carina. Nasogastric tube extends into stomach. LEFT subclavian central venous catheter tip projects over SVC. Numerous EKG leads project over chest. Enlargement of cardiac silhouette with pulmonary vascular congestion. Scattered interstitial infiltrates likely representing pulmonary edema. Decreased LEFT pleural effusion. LEFT lower lobe opacification could represent atelectasis or coexistent consolidation. No pneumothorax. IMPRESSION: Persistent pulmonary edema with decreased LEFT pleural effusion. LEFT lower lobe atelectasis versus consolidation. Electronically Signed   By: Lavonia Dana M.D.   On: 08/05/2017 08:11   Dg Chest Port 1 View  Result Date: 08/04/2017 CLINICAL DATA:  Respiratory failure. EXAM: PORTABLE CHEST 1 VIEW COMPARISON:  Yesterday. FINDINGS: Endotracheal tube in satisfactory position. Nasogastric tube extending the stomach. Stable left subclavian catheter. Increased patchy opacity in the perihilar regions bilaterally. No  significant change in patchy density interstitial prominence elsewhere in the right lung. Increased left pleural fluid. Stable enlarged cardiac silhouette. No acute bony abnormality. IMPRESSION: 1. Mild worsening of changes of pulmonary edema. 2. Increased left pleural fluid. 3. Stable cardiomegaly. Electronically Signed   By: Claudie Revering M.D.   On: 08/04/2017 07:05   Dg  Chest Port 1 View  Result Date: 08/03/2017 CLINICAL DATA:  Acute respiratory failure. EXAM: PORTABLE CHEST 1 VIEW COMPARISON:  Radiograph yesterday at 0456 hour.  Chest CT 07/31/2016 FINDINGS: Endotracheal tube tip at the thoracic inlet. Enteric tube in place, tip below the diaphragm. Left central line with tip in the proximal SVC. Unchanged cardiomegaly. Unchanged vascular congestion/pulmonary edema. Bibasilar opacities, left greater than right, unchanged. No pneumothorax. IMPRESSION: Unchanged appearance of the chest with cardiomegaly, pulmonary edema/vascular congestion, and bibasilar opacities, left greater than right. Electronically Signed   By: Jeb Levering M.D.   On: 08/03/2017 01:48   Dg Chest Port 1 View  Result Date: 08/02/2017 CLINICAL DATA:  52 year old female with respiratory failure. Left lung pneumonia. EXAM: PORTABLE CHEST 1 VIEW COMPARISON:  08/01/2017, chest CT 07/31/2017, and earlier. FINDINGS: Portable AP semi upright view at 0456 hours. Endotracheal tube tip at the level the clavicles. Enteric tube courses to the abdomen, tip not included. Stable left IJ or subclavian central line. Substantially improved left lung ventilation since 07/31/2017, although residual moderate confluent left lung base opacity remains. Interval increasing opacity at the right lung base. No pneumothorax. Chronic but increased underlying bilateral pulmonary interstitial opacity compared to 2018, possibly mild vascular congestion. Small bilateral pleural effusions are suspected. Stable cardiac size and mediastinal contours. Cardiomegaly and  pericardial effusion noted on chest CT and CTA this month. Paucity of bowel gas in the upper abdomen. IMPRESSION: 1.  Stable lines and tubes. 2. Improved left lung multilobar pneumonia since 07/31/2017, but moderate residual at the lung base. 3. Worsening ventilation at the right lung base over that same time suspicious for right lung involvement now. 4. Stable cardiomegaly.  Mild vascular congestion/pulmonary edema. 5. Small bilateral pleural effusions suspected. Electronically Signed   By: Genevie Ann M.D.   On: 08/02/2017 11:00   Dg Chest Port 1 View  Result Date: 08/01/2017 CLINICAL DATA:  Respiratory failure EXAM: PORTABLE CHEST 1 VIEW COMPARISON:  Chest radiograph and chest CT July 31, 2017 FINDINGS: Endotracheal tube tip is 3.7 cm above the carina. Central catheter tip is in the superior vena cava. No pneumothorax. There has been significant partial clearing on the left compared to 1 day prior. There has been the significant clearing of diffuse consolidation and effusion. There remains atelectatic change in the left base as well as small left effusion. There has been a clearing of consolidation from the right upper lobe. Currently the right lung is clear except for atelectasis in the left base. There is cardiomegaly with pulmonary venous hypertension. No adenopathy evident. No bone lesions. IMPRESSION: Tube and catheter positions as described without pneumothorax. Significant clearing at the throughout the left lung and right upper lobe. There remains atelectatic change in each lower lung zone as well as underlying pulmonary vascular congestion. There is felt to be small left residual pleural effusion. Electronically Signed   By: Lowella Grip III M.D.   On: 08/01/2017 07:56   Dg Chest Port 1 View  Result Date: 07/31/2017 CLINICAL DATA:  Intubation and central line placement. EXAM: PORTABLE CHEST 1 VIEW COMPARISON:  Chest x-ray from same day at 0529. FINDINGS: Interval placement of an endotracheal  tube with the tip approximately 1 cm above the level of the carina. Enteric tube seen entering the stomach. Left subclavian central venous catheter with the tip at the cavoatrial junction. Stable cardiomegaly. Unchanged complete opacification of the left hemithorax due to large pleural effusion and left lung collapse. Increasing airspace disease within the right upper lobe.  No acute osseous abnormality. IMPRESSION: 1. Interval placement of an endotracheal tube with the tip approximately 1 cm above the level of the carina. Recommend retraction 2-3 cm. 2. Appropriately positioned left subclavian central venous catheter. 3. Unchanged large left pleural effusion and left lung collapse. 4. Worsening airspace disease in the right upper lobe which could reflect edema or infection. Electronically Signed   By: Titus Dubin M.D.   On: 07/31/2017 10:51   Dg Chest Port 1 View  Result Date: 07/31/2017 CLINICAL DATA:  Acute respiratory failure EXAM: PORTABLE CHEST 1 VIEW COMPARISON:  07/30/2017 FINDINGS: Dense consolidation of the left hemithorax unchanged due to effusion and collapse. Progression of right lower lobe atelectasis/infiltrate. Diffuse airspace disease on the right may represent edema. IMPRESSION: Persistent collapse of the left lung Progressive right lower lobe atelectasis/infiltrate and small right effusion. Electronically Signed   By: Franchot Gallo M.D.   On: 07/31/2017 07:26   Dg Abd Portable 1v  Result Date: 07/31/2017 CLINICAL DATA:  NG tube placement. EXAM: PORTABLE ABDOMEN - 1 VIEW COMPARISON:  None. FINDINGS: Enteric tube in place with the tip in the distal gastric body. Nonobstructive bowel gas pattern. No acute osseous abnormality. IMPRESSION: Enteric tube appropriately positioned with the tip in the distal gastric body. Electronically Signed   By: Titus Dubin M.D.   On: 07/31/2017 10:52     CBC Recent Labs  Lab 08/05/17 0532 08/08/17 0435 08/10/17 0505 08/11/17 0508  WBC 9.9  12.4* 13.6* 11.8*  HGB 13.6 14.8 16.5* 15.9  HCT 44.3 48.3* 52.7* 53.6*  PLT 375 426 359 337  MCV 78.9* 77.4* 78.6* 79.8*  MCH 24.3* 23.7* 24.7* 23.6*  MCHC 30.8* 30.6* 31.4* 29.6*  RDW 21.0* 21.8* 22.0* 21.9*  LYMPHSABS  --  1.2  --  1.5  MONOABS  --  0.7  --  1.4*  EOSABS  --  0.0  --  0.1  BASOSABS  --  0.0  --  0.1    Chemistries  Recent Labs  Lab 08/05/17 0532  08/06/17 0347 08/08/17 0435 08/09/17 0516 08/10/17 0505 08/11/17 0508  NA 140   < > 141 138 142 143 148*  K 4.8   < > 4.9 5.0 4.4 4.4 3.9  CL 95*   < > 93* 93* 95* 98* 102  CO2 37*   < > 38* 34* 35* 34* 34*  GLUCOSE 174*   < > 172* 148* 104* 148* 145*  BUN 44*   < > 46* 54* 60* 70* 52*  CREATININE 0.77   < > 0.66 0.73 0.89 0.80 0.67  CALCIUM 9.2   < > 9.8 10.5* 11.1* 10.8* 10.4*  MG 2.3  --  2.3 2.8*  --   --  2.0   < > = values in this interval not displayed.   ------------------------------------------------------------------------------------------------------------------ estimated creatinine clearance is 89 mL/min (by C-G formula based on SCr of 0.67 mg/dL). ------------------------------------------------------------------------------------------------------------------ No results for input(s): HGBA1C in the last 72 hours. ------------------------------------------------------------------------------------------------------------------ No results for input(s): CHOL, HDL, LDLCALC, TRIG, CHOLHDL, LDLDIRECT in the last 72 hours. ------------------------------------------------------------------------------------------------------------------ No results for input(s): TSH, T4TOTAL, T3FREE, THYROIDAB in the last 72 hours.  Invalid input(s): FREET3 ------------------------------------------------------------------------------------------------------------------ No results for input(s): VITAMINB12, FOLATE, FERRITIN, TIBC, IRON, RETICCTPCT in the last 72 hours.  Coagulation profile No results for input(s): INR,  PROTIME in the last 168 hours.  No results for input(s): DDIMER in the last 72 hours.  Cardiac Enzymes No results for input(s): CKMB, TROPONINI, MYOGLOBIN in the last 168  hours.  Invalid input(s): CK ------------------------------------------------------------------------------------------------------------------ Invalid input(s): POCBNP    Assessment & Plan  52 year old female patient currently in ICU for acute on chronic respiratory failure, pericardial effusion and heart failure exacerbation.  1. Acute on chronic respiratory failure with hypoxia On ventilator breathing spontaneouly Off sedation Spontaneous breathing trials Extubation on Tuesday in OR with ENT at bedside is the plan Status post bronchoscopy no endobronchial tumors, mass and foreign body Appreciate intensivist management  2. Acute on chronic diastolic heart failure Lasix as needed for diuresis  3.  Left lung pneumonia versus pericarditis  Opacification of left hemithorax secondary to atelectasis and effusion  4.Pericardial effusion with no evidence of pericardial tamponade Cardiology follow-up appreciated  5.Nutrition tube feedin on hold because of abdominal distention, abdominal pain.  Start prokinetic agent and see if it helps. No  Residual but patient complained of abdominal bloating.  6.  DVT prophylaxis with subcu heparin 5000 units every 8 hourly Discussed with family especially the daughter at bedside.     Code Status Orders  (From admission, onward)        Start     Ordered   07/29/17 2224  Full code  Continuous     07/29/17 2223    Code Status History    Date Active Date Inactive Code Status Order ID Comments User Context   06/07/2017 1347 06/12/2017 2013 Full Code [id number]  Bettey Costa, MD Inpatient      Time Spent in minutes   35 minutes  Greater than 50% of time spent in care coordination and counseling patient regarding the condition and plan of care.   Epifanio Lesches M.D  on 08/11/2017 at 1:09 PM  Between 7am to 6pm - Pager - [phone]  After 6pm go to [email or web address] - Proofreader  [employer]   Office  [phone]

## 2017-08-11 NOTE — Progress Notes (Signed)
Restarting tube feeds- reglan given and per Dr Duanne LimerickSamaan restart feeds after a few hours. Patient did have a moderate formed bowel mvt. Will continue to monitor patient.

## 2017-08-11 NOTE — Progress Notes (Addendum)
Name: [name] MRN: [id number] DOB: 1966/01/28     CONSULTATION DATE: 07/29/2017 Subjective: Afebrile and no major issues last night  Objectives: Continues on vent and tolerating TF   HISTORY OF PRESENT ILLNESS:    PAST MEDICAL HISTORY :   has a past medical history of Anxiety, COPD (chronic obstructive pulmonary disease) (HCC), Essential hypertension, History of echocardiogram, Morbid obesity (HCC), and Tobacco abuse.  has a past surgical history that includes Cholecystectomy; Abdominal hysterectomy; and Hernia repair. Prior to Admission medications   Medication Sig Start Date End Date Taking? Authorizing Provider  albuterol (PROVENTIL HFA;VENTOLIN HFA) 108 (90 Base) MCG/ACT inhaler Inhale 2 puffs into the lungs every 6 (six) hours as needed for wheezing or shortness of breath. 06/12/17  Yes Gouru, Aruna, MD  amitriptyline (ELAVIL) 10 MG tablet Take 10 mg by mouth at bedtime as needed.  10/13/16 10/13/17 Yes [provider]  amLODipine (NORVASC) 5 MG tablet Take 5 mg by mouth daily. 03/14/17 03/14/18 Yes [provider]  aspirin EC 81 MG tablet Take 81 mg by mouth daily. 05/01/17 01/08/31 Yes [provider]  buPROPion (WELLBUTRIN SR) 100 MG 12 hr tablet Take 100 mg by mouth 2 (two) times daily.   Yes [provider]  clonazePAM (KLONOPIN) 0.5 MG tablet Take 0.5 mg by mouth 3 (three) times daily as needed.    Yes [provider]  cyclobenzaprine (FLEXERIL) 10 MG tablet Take 10 mg by mouth 3 (three) times daily as needed. 08/16/16  Yes [provider]  furosemide (LASIX) 20 MG tablet Take 1 tablet (20 mg total) by mouth 2 (two) times daily. Patient taking differently: Take 20 mg by mouth daily.  06/12/17 06/12/18 Yes Gouru, Aruna, MD  Mometasone Furoate 200 MCG/ACT AERO Inhale 2 puffs into the lungs.   Yes [provider]  naproxen sodium (ALEVE) 220 MG tablet Take 220 mg by mouth daily as needed.   Yes [provider]   nitroGLYCERIN (NITROSTAT) 0.4 MG SL tablet Place 0.4 mg under the tongue as needed. 05/01/17 05/01/18 Yes [provider]  senna-docusate (SENOKOT-S) 8.6-50 MG tablet Take 1 tablet by mouth at bedtime as needed for mild constipation. 06/12/17  Yes Gouru, Deanna ArtisAruna, MD   No Known Allergies  FAMILY HISTORY:  family history includes Dementia in her mother; Heart attack in her father. SOCIAL HISTORY:  reports that she has been smoking cigarettes.  She has a 30.00 pack-year smoking history. She has never used smokeless tobacco. She reports that she does not drink alcohol or use drugs.  REVIEW OF SYSTEMS:   Unable to obtain due to critical illness   VITAL SIGNS: Temp:  [98.7 F (37.1 C)-99.2 F (37.3 C)] 98.7 F (37.1 C) (03/30 0800) Pulse Rate:  [84-105] 92 (03/30 0800) Resp:  [16-25] 23 (03/30 0800) BP: (105-129)/(67-87) 117/68 (03/30 0800) SpO2:  [91 %-96 %] 94 % (03/30 0800) FiO2 (%):  [30 %-40 %] 30 % (03/30 0400) Weight:  [105.1 kg (231 lb 11.3 oz)] 105.1 kg (231 lb 11.3 oz) (03/30 0444)  Physical Examination:   Awake in no distress nonfocal neuro exam moving all extremities On the vent no distress, BEA E and no rales S1 + S2 audible no murmur  benign abdomen was febrile for sepsis No leg edema    ASSESSMENT / PLAN:   Acute respiratory failure.  Noted to have a very difficult airway and continues to have negative cough leak test. -Plan per ENT attempt extubation in the OR on  08/14/2017 was possible requirement for tracheostomy.  Prerenal azotemia (improved) with hypochloremia, hypercalcemia, hemoconcentration,and intravascular volume depletion was possible over diuresis and secondary to the negative catabolic effect of the steroids -Optimize diuresis/volume, off steroids, monitor renal panel and urine output.  Diastolic CHF. -Optimize diuresis to improve lung compliance.  Pneumonia (GPC in chains sputum).  Bibasilar airspace disease with improved left  atelectasis and worsening on Rt. -Empiric Zosyn.  Monitor CXR + CBC + FiO2.  Pericardial effusion was no evidence of pericardial tamponade.  Hemodynamically stable -Management as per cardiology  Full code  DVT & GI prophylaxis.  Continue supportive care.  Critical care time 35 min

## 2017-08-11 NOTE — Progress Notes (Signed)
No cuff leak heard when cuff deflated this am.

## 2017-08-12 ENCOUNTER — Inpatient Hospital Stay: Payer: Medicaid Other

## 2017-08-12 LAB — GLUCOSE, CAPILLARY
GLUCOSE-CAPILLARY: 144 mg/dL — AB (ref 65–99)
GLUCOSE-CAPILLARY: 152 mg/dL — AB (ref 65–99)
GLUCOSE-CAPILLARY: 155 mg/dL — AB (ref 65–99)
Glucose-Capillary: 161 mg/dL — ABNORMAL HIGH (ref 65–99)
Glucose-Capillary: 161 mg/dL — ABNORMAL HIGH (ref 65–99)
Glucose-Capillary: 152 mg/dL — ABNORMAL HIGH (ref 65–99)

## 2017-08-12 LAB — CULTURE, RESPIRATORY W GRAM STAIN: Culture: NORMAL

## 2017-08-12 LAB — CULTURE, RESPIRATORY

## 2017-08-12 LAB — CALCIUM, IONIZED: Calcium, Ionized, Serum: 5.5 mg/dL (ref 4.5–5.6)

## 2017-08-12 LAB — PROCALCITONIN

## 2017-08-12 MED ORDER — LORAZEPAM 2 MG/ML IJ SOLN
2.0000 mg | INTRAMUSCULAR | Status: DC | PRN
  Administered 2017-08-12: 2 mg via INTRAVENOUS
  Filled 2017-08-12: qty 1

## 2017-08-12 MED ORDER — FLUCONAZOLE 100 MG PO TABS
100.0000 mg | ORAL_TABLET | Freq: Every day | ORAL | Status: DC
  Administered 2017-08-13: 100 mg
  Filled 2017-08-12 (×2): qty 1

## 2017-08-12 MED ORDER — FLUCONAZOLE IN SODIUM CHLORIDE 200-0.9 MG/100ML-% IV SOLN
200.0000 mg | Freq: Once | INTRAVENOUS | Status: AC
  Administered 2017-08-12: 200 mg via INTRAVENOUS
  Filled 2017-08-12 (×2): qty 100

## 2017-08-12 NOTE — Progress Notes (Signed)
When mouth care performed this RN asked patient if she was having anymore stomach cramps. Patient shook her head no. When asked if she was having any pain she shook her head no.

## 2017-08-12 NOTE — Progress Notes (Signed)
SUBJECTIVE: Remains intubated   Vitals:   08/12/17 0800 08/12/17 0900 08/12/17 1100 08/12/17 1200  BP: 129/72 122/68 105/89 116/74  Pulse: (!) 104 99 (!) 117 (!) 110  Resp: (!) 22 (!) 21 (!) 23 18  Temp:   98.5 F (36.9 C)   TempSrc:      SpO2: 93% 93% 94% 94%  Weight:      Height:        Intake/Output Summary (Last 24 hours) at 08/12/2017 1245 Last data filed at 08/12/2017 1024 Gross per 24 hour  Intake 1239.83 ml  Output 1280 ml  Net -40.17 ml    LABS: Basic Metabolic Panel: Recent Labs    08/10/17 0505 08/11/17 0508  NA 143 148*  K 4.4 3.9  CL 98* 102  CO2 34* 34*  GLUCOSE 148* 145*  BUN 70* 52*  CREATININE 0.80 0.67  CALCIUM 10.8* 10.4*  MG  --  2.0  PHOS  --  3.7   Liver Function Tests: No results for input(s): AST, ALT, ALKPHOS, BILITOT, PROT, ALBUMIN in the last 72 hours. No results for input(s): LIPASE, AMYLASE in the last 72 hours. CBC: Recent Labs    08/10/17 0505 08/11/17 0508  WBC 13.6* 11.8*  NEUTROABS  --  8.8*  HGB 16.5* 15.9  HCT 52.7* 53.6*  MCV 78.6* 79.8*  PLT 359 337   Cardiac Enzymes: No results for input(s): CKTOTAL, CKMB, CKMBINDEX, TROPONINI in the last 72 hours. BNP: Invalid input(s): POCBNP D-Dimer: No results for input(s): DDIMER in the last 72 hours. Hemoglobin A1C: No results for input(s): HGBA1C in the last 72 hours. Fasting Lipid Panel: No results for input(s): CHOL, HDL, LDLCALC, TRIG, CHOLHDL, LDLDIRECT in the last 72 hours. Thyroid Function Tests: No results for input(s): TSH, T4TOTAL, T3FREE, THYROIDAB in the last 72 hours.  Invalid input(s): FREET3 Anemia Panel: No results for input(s): VITAMINB12, FOLATE, FERRITIN, TIBC, IRON, RETICCTPCT in the last 72 hours.   PHYSICAL EXAM General: Well developed, well nourished, in no acute distress HEENT:  Normocephalic and atramatic Neck:  No JVD.  Lungs: Clear bilaterally to auscultation and percussion. Heart: HRRR . Normal S1 and S2 without gallops or murmurs.   Abdomen: Bowel sounds are positive, abdomen soft and non-tender  Msk:  Back normal, normal gait. Normal strength and tone for age. Extremities: No clubbing, cyanosis or edema.   Neuro: Alert and oriented X 3. Psych:  Good affect, responds appropriately  TELEMETRY: NSR  ASSESSMENT AND PLAN: Pericardial effusion without hemodynamic compromise. Remains intubated.  Principal Problem:   Acute on chronic respiratory failure with hypoxia (HCC) Active Problems:   Acute respiratory failure (HCC)   Pericardial effusion    [name] A, MD, [facility] 08/12/2017 12:45 PM

## 2017-08-12 NOTE — Progress Notes (Signed)
Per Dr Duanne LimerickSamaan orderes to increase fentanyl as patient was having labored breathing- RR 19-23,heart rate- 120's.

## 2017-08-12 NOTE — Progress Notes (Signed)
Per Dr. Duanne LimerickSamaan maintain patient at a Rass of -2.

## 2017-08-12 NOTE — Progress Notes (Signed)
A cuff leak was not heard when the cuff was deflated this am.

## 2017-08-12 NOTE — Progress Notes (Signed)
Patient states that she is having stomach "cramps". KUB performed, patient took stools softeners this am, Reglan given, patient had medium bm yesterday and a small bowel movement today. Stomach is soft with bowel sounds present, passing gas. Repositioned patient. Per Dr. Duanne LimerickSamaan increase patients fentanyl to 100 mcg/hr.

## 2017-08-12 NOTE — Progress Notes (Addendum)
Name: [name] MRN: [id number] DOB: [birth year]     CONSULTATION DATE: 07/29/2017 Subjective: C/O lower abdominal pain  Objectives: Sedated with Fentanyl    HISTORY OF PRESENT ILLNESS:    PAST MEDICAL HISTORY :   has a past medical history of Anxiety, COPD (chronic obstructive pulmonary disease) (HCC), Essential hypertension, History of echocardiogram, Morbid obesity (HCC), and Tobacco abuse.  has a past surgical history that includes Cholecystectomy; Abdominal hysterectomy; and Hernia repair. Prior to Admission medications   Medication Sig Start Date End Date Taking? Authorizing Provider  albuterol (PROVENTIL HFA;VENTOLIN HFA) 108 (90 Base) MCG/ACT inhaler Inhale 2 puffs into the lungs every 6 (six) hours as needed for wheezing or shortness of breath. 06/12/17  Yes Gouru, Aruna, MD  amitriptyline (ELAVIL) 10 MG tablet Take 10 mg by mouth at bedtime as needed.  10/13/16 10/13/17 Yes [provider]  amLODipine (NORVASC) 5 MG tablet Take 5 mg by mouth daily. 03/14/17 03/14/18 Yes [provider]  aspirin EC 81 MG tablet Take 81 mg by mouth daily. 05/01/17 01/08/31 Yes [provider]  buPROPion (WELLBUTRIN SR) 100 MG 12 hr tablet Take 100 mg by mouth 2 (two) times daily.   Yes [provider]  clonazePAM (KLONOPIN) 0.5 MG tablet Take 0.5 mg by mouth 3 (three) times daily as needed.    Yes [provider]  cyclobenzaprine (FLEXERIL) 10 MG tablet Take 10 mg by mouth 3 (three) times daily as needed. 08/16/16  Yes [provider]  furosemide (LASIX) 20 MG tablet Take 1 tablet (20 mg total) by mouth 2 (two) times daily. Patient taking differently: Take 20 mg by mouth daily.  06/12/17 06/12/18 Yes Gouru, Aruna, MD  Mometasone Furoate 200 MCG/ACT AERO Inhale 2 puffs into the lungs.   Yes [provider]  naproxen sodium (ALEVE) 220 MG tablet Take 220 mg by mouth daily as needed.   Yes [provider]  nitroGLYCERIN  (NITROSTAT) 0.4 MG SL tablet Place 0.4 mg under the tongue as needed. 05/01/17 05/01/18 Yes [provider]  senna-docusate (SENOKOT-S) 8.6-50 MG tablet Take 1 tablet by mouth at bedtime as needed for mild constipation. 06/12/17  Yes Gouru, Deanna Artis, MD   No Known Allergies  FAMILY HISTORY:  family history includes Dementia in her mother; Heart attack in her father. SOCIAL HISTORY:  reports that she has been smoking cigarettes.  She has a 30.00 pack-year smoking history. She has never used smokeless tobacco. She reports that she does not drink alcohol or use drugs.  REVIEW OF SYSTEMS:   Unable to obtain due to critical illness   VITAL SIGNS: Temp:  [98.5 F (36.9 C)-99.6 F (37.6 C)] 98.5 F (36.9 C) (03/31 1100) Pulse Rate:  [94-112] 99 (03/31 0900) Resp:  [16-23] 21 (03/31 0900) BP: (112-129)/(46-86) 122/68 (03/31 0900) SpO2:  [91 %-95 %] 93 % (03/31 0900) FiO2 (%):  [40 %] 40 % (03/31 0853) Weight:  [106 kg (233 lb 11 oz)] 106 kg (233 lb 11 oz) (03/31 0404)  Physical Examination:  Awake in no distress nonfocal neuro exam moving all extremities On the vent no distress,BEA E and no rales S1+S2 audible no murmur  benign abdomen was febrile for sepsis No leg edema  ASSESSMENT / PLAN:  Acute respiratory failure.Noted to have a very difficult airway and continues to have negative cough leak test. -Plan per ENT attempt extubation in the OR on 04/02/2019was possible requirement for tracheostomy.  Prerenal azotemia (improved) with hypochloremia, hypercalcemia,hemoconcentration,and intravascular volume depletion  was possible over diuresis and secondary to the negative catabolic effect of the steroids -Optimize diuresis/volume,off steroids, monitor renal panel and urine output.  Diastolic CHF. -Optimize diuresis to improve lung compliance.  Pneumonia(Normal resp flora, sputum).Bibasilar airspace disease with improved left atelectasis. -Empiric Zosyn.Monitor  CXR + CBC + FiO2.  Pericardial effusion was no evidence of pericardial tamponade.Hemodynamically stable -Management as per cardiology  Oral thrush -Mycostatin wash.  Full code  DVT &GI prophylaxis. Continue supportive care. Family was updated at the bedside about starting sedation as the patient started getting uncomfortable and they agreed to the plan of care  Critical care time 35 min

## 2017-08-12 NOTE — Progress Notes (Signed)
Pharmacy Antibiotic Note  Angel MeekerSonya D [name] is a 52 y.o. female admitted on 07/29/2017 with oropharyngeal candidiasis .  Pharmacy has been consulted for fluconazole dosing.  Plan: fluconazole 200mg  iv x 1, followed by fluconazole 100mg  per tube daily  Height: 5' (152.4 cm) Weight: 233 lb 11 oz (106 kg) IBW/kg (Calculated) : 45.5  Temp (24hrs), Avg:98.9 F (37.2 C), Min:98.5 F (36.9 C), Max:99.6 F (37.6 C)  Recent Labs  Lab 08/06/17 0347 08/08/17 0435 08/09/17 0516 08/10/17 0505 08/11/17 0508  WBC  --  12.4*  --  13.6* 11.8*  CREATININE 0.66 0.73 0.89 0.80 0.67    Estimated Creatinine Clearance: 89.5 mL/min (by C-G formula based on SCr of 0.67 mg/dL).    No Known Allergies  Antimicrobials this admission: Anti-infectives (From admission, onward)   Start     Dose/Rate Route Frequency Ordered Stop   08/13/17 1000  fluconazole (DIFLUCAN) tablet 100 mg     100 mg Per Tube Daily 08/12/17 1134     08/12/17 1145  fluconazole (DIFLUCAN) IVPB 200 mg     200 mg 100 mL/hr over 60 Minutes Intravenous  Once 08/12/17 1134     08/10/17 1600  piperacillin-tazobactam (ZOSYN) IVPB 3.375 g     3.375 g 12.5 mL/hr over 240 Minutes Intravenous Every 8 hours 08/10/17 1550     07/31/17 0800  ceFEPIme (MAXIPIME) 2 g in sodium chloride 0.9 % 100 mL IVPB     2 g 200 mL/hr over 30 Minutes Intravenous Every 8 hours 07/31/17 0627 08/07/17 0030   07/29/17 1915  ceFEPIme (MAXIPIME) 2 g in sodium chloride 0.9 % 100 mL IVPB     2 g 200 mL/hr over 30 Minutes Intravenous  Once 07/29/17 1911 07/29/17 2050   07/29/17 1915  vancomycin (VANCOCIN) IVPB 1000 mg/200 mL premix     1,000 mg 200 mL/hr over 60 Minutes Intravenous  Once 07/29/17 1911 07/29/17 2342       Microbiology results: Recent Results (from the past 240 hour(s))  Culture, respiratory (NON-Expectorated)     Status: None   Collection Time: 08/04/17 11:53 AM  Result Value Ref Range Status   Specimen Description   Final    TRACHEAL  ASPIRATE Performed at [facility], [address] [place], [place] [address]    Special Requests   Final    NONE Performed at [facility], [address] [place], [place] [address]    Gram Stain   Final    RARE WBC PRESENT,BOTH PMN AND MONONUCLEAR RARE SQUAMOUS EPITHELIAL CELLS PRESENT RARE GRAM POSITIVE COCCI    Culture   Final    Consistent with normal respiratory flora. Performed at [facility], [address] [place], [place] [address]    Report Status 08/06/2017 FINAL  Final  Culture, respiratory (NON-Expectorated)     Status: None (Preliminary result)   Collection Time: 08/10/17  2:32 PM  Result Value Ref Range Status   Specimen Description TRACHEAL ASPIRATE  Final   Special Requests   Final    NONE Performed at [facility], [address] [place], [place] [address]    Gram Stain   Final    ABUNDANT WBC PRESENT, PREDOMINANTLY PMN RARE SQUAMOUS EPITHELIAL CELLS PRESENT MODERATE GRAM POSITIVE COCCI IN CHAINS    Culture   Final    FEW Consistent with normal respiratory flora. Performed at [facility], [address] [place], [place] [address]    Report Status PENDING  Incomplete  Thank you for allowing pharmacy to be a part of this patient's care.  Gerre Pebbles Margy Sumler 08/12/2017 11:34 AM

## 2017-08-12 NOTE — Progress Notes (Signed)
[name] at [facility]                                                                                                                                                                                  Patient Demographics   [name], is a 52 y.o. female, DOB - 1966/03/10, ZWC:[id number]  Admit date - 07/29/2017   Admitting Physician Vaughan Basta, MD  Outpatient Primary MD for the patient is [name], [name], MD   LOS - 14  Subjective: Patient is on pressure support during daytime,  On  full vent support at night.  Getting chest PT at this time.  Review of Systems:   Could not be obtained as patient is on ventilator but patient alert, able to communicate with family with lipmovement  Vitals:   Vitals:   08/12/17 0700 08/12/17 0800 08/12/17 0900 08/12/17 1100  BP: 120/71 129/72 122/68   Pulse: 100 (!) 104 99   Resp: 18 (!) 22 (!) 21   Temp: 99.3 F (37.4 C)   98.5 F (36.9 C)  TempSrc:      SpO2: 93% 93% 93%   Weight:      Height:        Wt Readings from Last 3 Encounters:  08/12/17 106 kg (233 lb 11 oz)  06/12/17 119 kg (262 lb 6.4 oz)  09/29/16 117.9 kg (260 lb)     Intake/Output Summary (Last 24 hours) at 08/12/2017 1158 Last data filed at 08/12/2017 1024 Gross per 24 hour  Intake 1239.83 ml  Output 1280 ml  Net -40.17 ml    Physical Exam:   GENERAL: 52 year old female patient lying on the bed on ventilator FiO2 40% PEEP 5pressure support 10 HEAD, EYES, EARS, NOSE AND THROAT: Atraumatic, normocephalic. Extraocular muscles are intact. Pupils equal and reactive to light. Sclerae anicteric. No conjunctival injection. ET tube noted NECK: Supple. There is no jugular venous distention. No bruits, no lymphadenopathy, no thyromegaly.  HEART: Regular rate and rhythm,. No murmurs, no rubs, no clicks.  LUNGS: Decreased air flow left lung, adequate air flow right lung.  Rales heard in left lung ABDOMEN: Soft, flat, nontender,  nondistended. Has good bowel sounds. No hepatosplenomegaly appreciated.  EXTREMITIES: No evidence of any cyanosis, clubbing, or peripheral edema.  +2 pedal and radial pulses bilaterally.  NEUROLOGIC: The patient is alert, awake  On ventilator SKIN: Moist and warm with no rashes appreciated.  Psych: could not be assessed LN: No inguinal LN enlargement    Antibiotics   Anti-infectives (From admission, onward)   Start     Dose/Rate Route Frequency Ordered Stop   08/13/17 1000  fluconazole (DIFLUCAN) tablet 100 mg  100 mg Per Tube Daily 08/12/17 1134     08/12/17 1145  fluconazole (DIFLUCAN) IVPB 200 mg     200 mg 100 mL/hr over 60 Minutes Intravenous  Once 08/12/17 1134     08/10/17 1600  piperacillin-tazobactam (ZOSYN) IVPB 3.375 g     3.375 g 12.5 mL/hr over 240 Minutes Intravenous Every 8 hours 08/10/17 1550     07/31/17 0800  ceFEPIme (MAXIPIME) 2 g in sodium chloride 0.9 % 100 mL IVPB     2 g 200 mL/hr over 30 Minutes Intravenous Every 8 hours 07/31/17 0627 08/07/17 0030   07/29/17 1915  ceFEPIme (MAXIPIME) 2 g in sodium chloride 0.9 % 100 mL IVPB     2 g 200 mL/hr over 30 Minutes Intravenous  Once 07/29/17 1911 07/29/17 2050   07/29/17 1915  vancomycin (VANCOCIN) IVPB 1000 mg/200 mL premix     1,000 mg 200 mL/hr over 60 Minutes Intravenous  Once 07/29/17 1911 07/29/17 2342      Medications   Scheduled Meds: . budesonide (PULMICORT) nebulizer solution  0.5 mg Nebulization BID  . chlorhexidine gluconate (MEDLINE KIT)  15 mL Mouth Rinse BID  . docusate  100 mg Per Tube BID  . famotidine  20 mg Per Tube BID  . feeding supplement (PRO-STAT SUGAR FREE 64)  30 mL Per Tube BID  . feeding supplement (VITAL HIGH PROTEIN)  1,000 mL Per Tube Q24H  . [START ON 08/13/2017] fluconazole  100 mg Per Tube Daily  . free water  100 mL Per Tube Q4H  . heparin  5,000 Units Subcutaneous Q8H  . insulin aspart  0-15 Units Subcutaneous Q4H  . insulin aspart  2 Units Subcutaneous Q4H  .  ipratropium-albuterol  3 mL Nebulization Q4H  . mouth rinse  15 mL Mouth Rinse 10 times per day  . metoCLOPramide (REGLAN) injection  5 mg Intravenous Q12H  . multivitamin  15 mL Per Tube Daily  . senna-docusate  1 tablet Oral BID  . sertraline  50 mg Per Tube QHS  . sodium chloride flush  10-40 mL Intracatheter Q12H   Continuous Infusions: . fentaNYL infusion INTRAVENOUS 25 mcg/hr (08/12/17 0845)  . fluconazole (DIFLUCAN) IV    . piperacillin-tazobactam (ZOSYN)  IV Stopped (08/12/17 1016)   PRN Meds:.albuterol, bisacodyl, fentaNYL, LORazepam, ondansetron (ZOFRAN) IV, sodium chloride flush   Data Review:   Micro Results Recent Results (from the past 240 hour(s))  Culture, respiratory (NON-Expectorated)     Status: None   Collection Time: 08/04/17 11:53 AM  Result Value Ref Range Status   Specimen Description   Final    TRACHEAL ASPIRATE Performed at [facility], [address] [place], [place] [address]    Special Requests   Final    NONE Performed at [facility], [place], [place] [address]    Gram Stain   Final    RARE WBC PRESENT,BOTH PMN AND MONONUCLEAR RARE SQUAMOUS EPITHELIAL CELLS PRESENT RARE GRAM POSITIVE COCCI    Culture   Final    Consistent with normal respiratory flora. Performed at [facility], [place] [address] [place], [place] [address]    Report Status 08/06/2017 FINAL  Final  Culture, respiratory (NON-Expectorated)     Status: None (Preliminary result)   Collection Time: 08/10/17  2:32 PM  Result Value Ref Range Status   Specimen Description TRACHEAL ASPIRATE  Final   Special Requests   Final    NONE Performed at [facility],  [place], [place] [address]    Gram Stain   Final    ABUNDANT WBC PRESENT, PREDOMINANTLY PMN RARE SQUAMOUS EPITHELIAL CELLS PRESENT MODERATE GRAM POSITIVE COCCI IN CHAINS    Culture   Final    FEW Consistent with normal respiratory flora. Performed at  [facility], [place] [address], [place], [place] [address]    Report Status PENDING  Incomplete    Radiology Reports Dg Chest 2 View  Result Date: 07/30/2017 CLINICAL DATA:  Shortness of breath. Acute on chronic respiratory failure. Hypoxia. Morbid obesity. EXAM: CHEST - 2 VIEW COMPARISON:  Chest x-ray and CT chest 07/29/2017. FINDINGS: Cardiac silhouette is partially silhouetted by large pleural effusion on the LEFT. There is a whiteout of the LEFT hemithorax, which could represent mucous plugging and or increasing effusion. The RIGHT lung demonstrates interstitial edema with small effusion. IMPRESSION: Marked worsening aeration. Complete opacity of the LEFT hemithorax represents a significant change from yesterday's radiograph. Electronically Signed   By: Staci Righter M.D.   On: 07/30/2017 08:45   Dg Chest 2 View  Result Date: 07/29/2017 CLINICAL DATA:  52 year old female with history of chest pain since last week. Pain most severe during deep breathing. EXAM: CHEST - 2 VIEW COMPARISON:  Chest x-ray 06/10/2017. FINDINGS: Left lower lobe airspace consolidation concerning for pneumonia. Small bilateral pleural effusions. Mild diffuse peribronchial cuffing. Mild cardiomegaly. Upper mediastinal contours appear widened, but are stable compared to prior examinations. IMPRESSION: 1. Left lower lobe pneumonia with small bilateral pleural effusions. Followup PA and lateral chest X-ray is recommended in 3-4 weeks following trial of antibiotic therapy to ensure resolution and exclude underlying malignancy. 2. Mild cardiomegaly. Electronically Signed   By: Vinnie Langton M.D.   On: 07/29/2017 17:37   Dg Abd 1 View  Result Date: 08/12/2017 CLINICAL DATA:  Dyspepsia.  Difficulty feeding. EXAM: ABDOMEN - 1 VIEW COMPARISON:  08/07/2017 FINDINGS: The enteric tube tip projects over the expected location of the distal stomach. Side port is well below GE junction. Cholecystectomy clips noted. No abnormal bowel  dilatation identified. IMPRESSION: 1. NG tube tip within the body of stomach. 2. No abnormal bowel dilatation noted. Electronically Signed   By: Kerby Moors M.D.   On: 08/12/2017 10:40   Dg Abd 1 View  Result Date: 08/07/2017 CLINICAL DATA:  Fecal impaction. EXAM: ABDOMEN - 1 VIEW COMPARISON:  07/31/2017 FINDINGS: Nasogastric tube has its tip in the midportion of the stomach. Clips in the right upper quadrant consistent with previous cholecystectomy. Single clip in the right lower quadrant, possibly appendectomy. Moderate amount of fecal matter within the right colon. No sign of obstruction. IMPRESSION: Nasogastric tube in the mid stomach. Moderate amount of fecal matter in the right colon. No sign of obstruction. Electronically Signed   By: Nelson Chimes M.D.   On: 08/07/2017 11:32   Ct Chest Wo Contrast  Result Date: 07/31/2017 CLINICAL DATA:  She presented to [facility] ER on 03/17 with c/o intermittent chest pain worse during inspiration and nonproductive cough onset 1 week prior to presentation. On 03/18 pt developed hypotension and worsening acute hypoxic respiratory failure. EXAM: CT CHEST WITHOUT CONTRAST TECHNIQUE: Multidetector CT imaging of the chest was performed following the standard protocol without IV contrast. COMPARISON:  07/29/2017, 06/10/2017 FINDINGS: Cardiovascular: Stable cardiomegaly. Small pericardial effusion. Dilatation of main pulmonary artery as well as the right and left main pulmonary arteries as can be seen with pulmonary arterial hypertension. Thoracic aorta is normal in caliber. Mediastinum/Nodes: Enlarged  prevascular lymph node measuring 17 mm in short axis. Multiple other smaller mediastinal lymph nodes are noted. Thyroid gland, trachea, and esophagus demonstrate no significant findings. Lungs/Pleura: Trace right pleural effusion. Small left pleural effusion. Left lower lobe airspace disease with air bronchograms concerning for pneumonia. Improved aeration of left upper lobe.  Mild persistent right upper lobe airspace disease likely reflecting atelectasis. Stable 6 mm right middle lobe pulmonary nodule unchanged compared with 09/01/2013. Upper Abdomen: Aortic atherosclerosis. Musculoskeletal: No acute osseous abnormality. No aggressive osseous lesion. IMPRESSION: 1. Left lower lobe airspace disease with air bronchograms concerning for combination of pneumonia and atelectasis. Mild mediastinal lymphadenopathy likely reactive. 2. Small left pleural effusion.  Mild right upper lobe atelectasis. 3.  Aortic Atherosclerosis (ICD10-I70.0). Electronically Signed   By: Kathreen Devoid   On: 07/31/2017 12:47   Ct Angio Chest Pe W And/or Wo Contrast  Result Date: 07/29/2017 CLINICAL DATA:  52 year old female with history of chest pain since last week. Difficulty taking deep breaths. EXAM: CT ANGIOGRAPHY CHEST WITH CONTRAST TECHNIQUE: Multidetector CT imaging of the chest was performed using the standard protocol during bolus administration of intravenous contrast. Multiplanar CT image reconstructions and MIPs were obtained to evaluate the vascular anatomy. CONTRAST:  30m ISOVUE-370 IOPAMIDOL (ISOVUE-370) INJECTION 76% COMPARISON:  Chest CT 06/10/2017. FINDINGS: Cardiovascular: No filling defects within the pulmonary arterial tree to suggest underlying pulmonary embolism. Heart size is normal. Moderate volume of pericardial fluid with pericardial enhancement concerning for pericarditis. No pericardial calcification. There is aortic atherosclerosis, as well as atherosclerosis of the great vessels of the mediastinum and the coronary arteries, including calcified atherosclerotic plaque in the left anterior descending coronary artery. Mediastinum/Nodes: Multiple prominent borderline enlarged and enlarged mediastinal lymph nodes, largest of which is in the prevascular nodal station measuring up to 22 mm in short axis, increased compared to the prior study. Esophagus is unremarkable in appearance. No  axillary lymphadenopathy. Lungs/Pleura: Moderate left pleural effusion with near complete passive atelectasis of the left lower lobe. Some dependent subsegmental atelectasis is also noted in the left upper lobe. Trace right pleural effusion. Throughout the remaining portions of the lungs there is a background of ground-glass attenuation and mild interlobular septal thickening, indicative of a background of mild interstitial pulmonary edema. 5 mm right middle lobe pulmonary nodule (axial image 47 of series 7), stable dating back to at least 09/01/2013, considered definitively benign. Upper Abdomen: Aortic atherosclerosis. Musculoskeletal: There are no aggressive appearing lytic or blastic lesions noted in the visualized portions of the skeleton. Review of the MIP images confirms the above findings. IMPRESSION: 1. No evidence of pulmonary embolism. 2. Extensive pericardial enhancement with moderate volume of pericardial fluid, indicative of an acute pericarditis. 3. This is associated with increasing mediastinal lymphadenopathy compared to the prior study. 4. In addition, there is evidence of mild pulmonary edema and bilateral pleural effusions (left greater than right) with extensive areas of passive atelectasis in the left lung. 5. Aortic atherosclerosis, in addition to left anterior descending coronary artery disease. Please note that although the presence of coronary artery calcium documents the presence of coronary artery disease, the severity of this disease and any potential stenosis cannot be assessed on this non-gated CT examination. Assessment for potential risk factor modification, dietary therapy or pharmacologic therapy may be warranted, if clinically indicated. Aortic Atherosclerosis (ICD10-I70.0). Electronically Signed   By: DVinnie LangtonM.D.   On: 07/29/2017 19:45   Dg Chest Port 1 View  Result Date: 08/11/2017 CLINICAL DATA:  Pneumonia EXAM: PORTABLE CHEST  1 VIEW COMPARISON:  Yesterday  FINDINGS: Endotracheal tube tip at the clavicular heads. Left subclavian central line with tip at the distal SVC. An orogastric tube reaches the stomach. Indistinct opacities at the lung bases. Generalized interstitial coarsening that is stable. Unchanged cardiopericardial enlargement. IMPRESSION: 1. Stable positioning of tubes and central line. 2. History of pneumonia with unchanged lung opacity. 3. Cardiopericardial enlargement. Pericardial effusion by recent chest CT. Electronically Signed   By: Monte Fantasia M.D.   On: 08/11/2017 07:45   Dg Chest Port 1 View  Result Date: 08/10/2017 CLINICAL DATA:  Rhonchi, acute on chronic respiratory failure with hypoxia, heart failure, pericardial effusion, pneumonia versus pericarditis, COPD EXAM: PORTABLE CHEST 1 VIEW COMPARISON:  Portable exam 1445 hours compared to 08/08/2017 FINDINGS: Tip of endotracheal tube projects 3.0 cm above carina. Nasogastric tube extends into stomach. LEFT subclavian central venous catheter tip projects over SVC. Enlargement of cardiac silhouette with vascular congestion. Increased RIGHT lung infiltrate. Questionable LEFT lower lobe atelectasis versus consolidation. No gross pleural effusion or pneumothorax. IMPRESSION: Increased interstitial infiltrates in RIGHT lung with questionable atelectasis versus infiltrate in LEFT lower lobe. Enlargement of cardiac silhouette with pulmonary vascular congestion. Sign report Electronically Signed   By: Lavonia Dana M.D.   On: 08/10/2017 15:08   Dg Chest Port 1 View  Result Date: 08/08/2017 CLINICAL DATA:  Acute respiratory distress. History of acute on chronic respiratory failure, pericardial effusion, COPD, current smoker. EXAM: PORTABLE CHEST 1 VIEW COMPARISON:  Portable chest x-ray of August 07, 2017 FINDINGS: The lungs are well-expanded. Persistent bibasilar interstitial and airspace opacities are present. The cardiac silhouette is enlarged. The pulmonary vascularity is mildly congested. The  endotracheal tube tip lies 3.3 cm above the carina. The esophagogastric tube tip projects below the inferior margin of the image. The left internal jugular venous catheter tip projects over the junction of the proximal and midportions of the SVC. IMPRESSION: Bibasilar atelectasis or pneumonia. Mild CHF. The support tubes are in reasonable position. Electronically Signed   By: David  Martinique M.D.   On: 08/08/2017 09:39   Dg Chest Port 1 View  Result Date: 08/07/2017 CLINICAL DATA:  Hypoxia EXAM: PORTABLE CHEST 1 VIEW COMPARISON:  August 05, 2017 FINDINGS: Endotracheal tube tip is 4.0 cm above the carina. Central catheter tip is in superior vena cava. Nasogastric tube tip and side port are below the diaphragm. No pneumothorax. There are small pleural effusions bilaterally. There is consolidation in the left lower lobe as well as to a lesser extent in the right base. Heart is enlarged with pulmonary venous hypertension. No adenopathy. No bone lesions. IMPRESSION: Tube and catheter positions as described without pneumothorax. Bibasilar consolidation, more on the left than on the right, likely multifocal pneumonia. A degree of alveolar edema in the bases is possible. There is underlying pulmonary vascular congestion with small pleural effusions bilaterally. Electronically Signed   By: Lowella Grip III M.D.   On: 08/07/2017 07:36   Dg Chest Port 1 View  Result Date: 08/05/2017 CLINICAL DATA:  Acute respiratory failure, COPD, hypertension, smoker EXAM: PORTABLE CHEST 1 VIEW COMPARISON:  Portable exam 0548 hours compared to 08/04/2017 FINDINGS: Tip of endotracheal tube projects 4.1 cm above carina. Nasogastric tube extends into stomach. LEFT subclavian central venous catheter tip projects over SVC. Numerous EKG leads project over chest. Enlargement of cardiac silhouette with pulmonary vascular congestion. Scattered interstitial infiltrates likely representing pulmonary edema. Decreased LEFT pleural effusion. LEFT  lower lobe opacification could represent atelectasis or coexistent consolidation. No pneumothorax.  IMPRESSION: Persistent pulmonary edema with decreased LEFT pleural effusion. LEFT lower lobe atelectasis versus consolidation. Electronically Signed   By: Lavonia Dana M.D.   On: 08/05/2017 08:11   Dg Chest Port 1 View  Result Date: 08/04/2017 CLINICAL DATA:  Respiratory failure. EXAM: PORTABLE CHEST 1 VIEW COMPARISON:  Yesterday. FINDINGS: Endotracheal tube in satisfactory position. Nasogastric tube extending the stomach. Stable left subclavian catheter. Increased patchy opacity in the perihilar regions bilaterally. No significant change in patchy density interstitial prominence elsewhere in the right lung. Increased left pleural fluid. Stable enlarged cardiac silhouette. No acute bony abnormality. IMPRESSION: 1. Mild worsening of changes of pulmonary edema. 2. Increased left pleural fluid. 3. Stable cardiomegaly. Electronically Signed   By: Claudie Revering M.D.   On: 08/04/2017 07:05   Dg Chest Port 1 View  Result Date: 08/03/2017 CLINICAL DATA:  Acute respiratory failure. EXAM: PORTABLE CHEST 1 VIEW COMPARISON:  Radiograph yesterday at 0456 hour.  Chest CT 07/31/2016 FINDINGS: Endotracheal tube tip at the thoracic inlet. Enteric tube in place, tip below the diaphragm. Left central line with tip in the proximal SVC. Unchanged cardiomegaly. Unchanged vascular congestion/pulmonary edema. Bibasilar opacities, left greater than right, unchanged. No pneumothorax. IMPRESSION: Unchanged appearance of the chest with cardiomegaly, pulmonary edema/vascular congestion, and bibasilar opacities, left greater than right. Electronically Signed   By: Jeb Levering M.D.   On: 08/03/2017 01:48   Dg Chest Port 1 View  Result Date: 08/02/2017 CLINICAL DATA:  52 year old female with respiratory failure. Left lung pneumonia. EXAM: PORTABLE CHEST 1 VIEW COMPARISON:  08/01/2017, chest CT 07/31/2017, and earlier. FINDINGS:  Portable AP semi upright view at 0456 hours. Endotracheal tube tip at the level the clavicles. Enteric tube courses to the abdomen, tip not included. Stable left IJ or subclavian central line. Substantially improved left lung ventilation since 07/31/2017, although residual moderate confluent left lung base opacity remains. Interval increasing opacity at the right lung base. No pneumothorax. Chronic but increased underlying bilateral pulmonary interstitial opacity compared to 2018, possibly mild vascular congestion. Small bilateral pleural effusions are suspected. Stable cardiac size and mediastinal contours. Cardiomegaly and pericardial effusion noted on chest CT and CTA this month. Paucity of bowel gas in the upper abdomen. IMPRESSION: 1.  Stable lines and tubes. 2. Improved left lung multilobar pneumonia since 07/31/2017, but moderate residual at the lung base. 3. Worsening ventilation at the right lung base over that same time suspicious for right lung involvement now. 4. Stable cardiomegaly.  Mild vascular congestion/pulmonary edema. 5. Small bilateral pleural effusions suspected. Electronically Signed   By: Genevie Ann M.D.   On: 08/02/2017 11:00   Dg Chest Port 1 View  Result Date: 08/01/2017 CLINICAL DATA:  Respiratory failure EXAM: PORTABLE CHEST 1 VIEW COMPARISON:  Chest radiograph and chest CT July 31, 2017 FINDINGS: Endotracheal tube tip is 3.7 cm above the carina. Central catheter tip is in the superior vena cava. No pneumothorax. There has been significant partial clearing on the left compared to 1 day prior. There has been the significant clearing of diffuse consolidation and effusion. There remains atelectatic change in the left base as well as small left effusion. There has been a clearing of consolidation from the right upper lobe. Currently the right lung is clear except for atelectasis in the left base. There is cardiomegaly with pulmonary venous hypertension. No adenopathy evident. No bone  lesions. IMPRESSION: Tube and catheter positions as described without pneumothorax. Significant clearing at the throughout the left lung and right upper lobe. There remains  atelectatic change in each lower lung zone as well as underlying pulmonary vascular congestion. There is felt to be small left residual pleural effusion. Electronically Signed   By: Lowella Grip III M.D.   On: 08/01/2017 07:56   Dg Chest Port 1 View  Result Date: 07/31/2017 CLINICAL DATA:  Intubation and central line placement. EXAM: PORTABLE CHEST 1 VIEW COMPARISON:  Chest x-ray from same day at 0529. FINDINGS: Interval placement of an endotracheal tube with the tip approximately 1 cm above the level of the carina. Enteric tube seen entering the stomach. Left subclavian central venous catheter with the tip at the cavoatrial junction. Stable cardiomegaly. Unchanged complete opacification of the left hemithorax due to large pleural effusion and left lung collapse. Increasing airspace disease within the right upper lobe. No acute osseous abnormality. IMPRESSION: 1. Interval placement of an endotracheal tube with the tip approximately 1 cm above the level of the carina. Recommend retraction 2-3 cm. 2. Appropriately positioned left subclavian central venous catheter. 3. Unchanged large left pleural effusion and left lung collapse. 4. Worsening airspace disease in the right upper lobe which could reflect edema or infection. Electronically Signed   By: Titus Dubin M.D.   On: 07/31/2017 10:51   Dg Chest Port 1 View  Result Date: 07/31/2017 CLINICAL DATA:  Acute respiratory failure EXAM: PORTABLE CHEST 1 VIEW COMPARISON:  07/30/2017 FINDINGS: Dense consolidation of the left hemithorax unchanged due to effusion and collapse. Progression of right lower lobe atelectasis/infiltrate. Diffuse airspace disease on the right may represent edema. IMPRESSION: Persistent collapse of the left lung Progressive right lower lobe atelectasis/infiltrate  and small right effusion. Electronically Signed   By: Franchot Gallo M.D.   On: 07/31/2017 07:26   Dg Abd Portable 1v  Result Date: 07/31/2017 CLINICAL DATA:  NG tube placement. EXAM: PORTABLE ABDOMEN - 1 VIEW COMPARISON:  None. FINDINGS: Enteric tube in place with the tip in the distal gastric body. Nonobstructive bowel gas pattern. No acute osseous abnormality. IMPRESSION: Enteric tube appropriately positioned with the tip in the distal gastric body. Electronically Signed   By: Titus Dubin M.D.   On: 07/31/2017 10:52     CBC Recent Labs  Lab 08/08/17 0435 08/10/17 0505 08/11/17 0508  WBC 12.4* 13.6* 11.8*  HGB 14.8 16.5* 15.9  HCT 48.3* 52.7* 53.6*  PLT 426 359 337  MCV 77.4* 78.6* 79.8*  MCH 23.7* 24.7* 23.6*  MCHC 30.6* 31.4* 29.6*  RDW 21.8* 22.0* 21.9*  LYMPHSABS 1.2  --  1.5  MONOABS 0.7  --  1.4*  EOSABS 0.0  --  0.1  BASOSABS 0.0  --  0.1    Chemistries  Recent Labs  Lab 08/06/17 0347 08/08/17 0435 08/09/17 0516 08/10/17 0505 08/11/17 0508  NA 141 138 142 143 148*  K 4.9 5.0 4.4 4.4 3.9  CL 93* 93* 95* 98* 102  CO2 38* 34* 35* 34* 34*  GLUCOSE 172* 148* 104* 148* 145*  BUN 46* 54* 60* 70* 52*  CREATININE 0.66 0.73 0.89 0.80 0.67  CALCIUM 9.8 10.5* 11.1* 10.8* 10.4*  MG 2.3 2.8*  --   --  2.0   ------------------------------------------------------------------------------------------------------------------ estimated creatinine clearance is 89.5 mL/min (by C-G formula based on SCr of 0.67 mg/dL). ------------------------------------------------------------------------------------------------------------------ No results for input(s): HGBA1C in the last 72 hours. ------------------------------------------------------------------------------------------------------------------ No results for input(s): CHOL, HDL, LDLCALC, TRIG, CHOLHDL, LDLDIRECT in the last 72  hours. ------------------------------------------------------------------------------------------------------------------ No results for input(s): TSH, T4TOTAL, T3FREE, THYROIDAB in the last 72 hours.  Invalid input(s): FREET3 ------------------------------------------------------------------------------------------------------------------  No results for input(s): VITAMINB12, FOLATE, FERRITIN, TIBC, IRON, RETICCTPCT in the last 72 hours.  Coagulation profile No results for input(s): INR, PROTIME in the last 168 hours.  No results for input(s): DDIMER in the last 72 hours.  Cardiac Enzymes No results for input(s): CKMB, TROPONINI, MYOGLOBIN in the last 168 hours.  Invalid input(s): CK ------------------------------------------------------------------------------------------------------------------ Invalid input(s): POCBNP    Assessment & Plan  52 year old female patient currently in ICU for acute on chronic respiratory failure, pericardial effusion and heart failure exacerbation.  1. Acute on chronic respiratory failure with hypoxia On ventilator breathing spontaneouly Off sedation Spontaneous breathing trials Extubation on Tuesday in OR with ENT at bedside is the plan Status post bronchoscopy no endobronchial tumors, mass and foreign body Appreciate intensivist management  2. Acute on chronic diastolic heart failure Lasix as needed for diuresis  3.  Left lung pneumonia versus pericarditis  Opacification of left hemithorax secondary to atelectasis and effusion Continues on empiric Zosyn.  4.Pericardial effusion with no evidence of pericardial tamponade Cardiology follow-up appreciated  5.nutrition: Continue tube  feeds,, Reglan.  6.  DVT prophylaxis with subcu heparin 5000 units every 8 hourly Discussed with family especially the daughter at bedside.     Code Status Orders  (From admission, onward)        Start     Ordered   07/29/17 2224  Full code  Continuous      07/29/17 2223    Code Status History    Date Active Date Inactive Code Status Order ID Comments User Context   06/07/2017 1347 06/12/2017 2013 Full Code [id number]  Bettey Costa, MD Inpatient      Time Spent in minutes   35 minutes  Greater than 50% of time spent in care coordination and counseling patient regarding the condition and plan of care.   Epifanio Lesches M.D on 08/12/2017 at 11:58 AM  Between 7am to 6pm - Pager - [phone]  After 6pm go to [email or web address] - Proofreader  [employer]   Office  [phone]

## 2017-08-12 NOTE — Plan of Care (Signed)
VSS O/N, A & O x 4, UOP adequate, pt. Rested well- pain well controlled on minimal fentanyl. Pt. Refused oral care and turns for most of shift- citing the desire to rest. No care concerns at this time

## 2017-08-13 ENCOUNTER — Inpatient Hospital Stay: Payer: Medicaid Other

## 2017-08-13 LAB — PHOSPHORUS: PHOSPHORUS: 3.8 mg/dL (ref 2.5–4.6)

## 2017-08-13 LAB — LACTIC ACID, PLASMA
LACTIC ACID, VENOUS: 0.7 mmol/L (ref 0.5–1.9)
Lactic Acid, Venous: 0.8 mmol/L (ref 0.5–1.9)

## 2017-08-13 LAB — COMPREHENSIVE METABOLIC PANEL
ALK PHOS: 68 U/L (ref 38–126)
ALT: 34 U/L (ref 14–54)
ANION GAP: 17 — AB (ref 5–15)
AST: 19 U/L (ref 15–41)
Albumin: 3.5 g/dL (ref 3.5–5.0)
BILIRUBIN TOTAL: 1.8 mg/dL — AB (ref 0.3–1.2)
BUN: 42 mg/dL — ABNORMAL HIGH (ref 6–20)
CALCIUM: 9.4 mg/dL (ref 8.9–10.3)
CO2: 30 mmol/L (ref 22–32)
Chloride: 103 mmol/L (ref 101–111)
Creatinine, Ser: 0.8 mg/dL (ref 0.44–1.00)
GFR calc non Af Amer: >60 mL/min (ref >60)
Glucose, Bld: 163 mg/dL — ABNORMAL HIGH (ref 65–99)
Potassium: 3.8 mmol/L (ref 3.5–5.1)
Sodium: 150 mmol/L — ABNORMAL HIGH (ref 135–145)
TOTAL PROTEIN: 7.7 g/dL (ref 6.5–8.1)

## 2017-08-13 LAB — GLUCOSE, CAPILLARY
GLUCOSE-CAPILLARY: 157 mg/dL — AB (ref 65–99)
GLUCOSE-CAPILLARY: 157 mg/dL — AB (ref 65–99)
Glucose-Capillary: 142 mg/dL — ABNORMAL HIGH (ref 65–99)
Glucose-Capillary: 162 mg/dL — ABNORMAL HIGH (ref 65–99)
Glucose-Capillary: 165 mg/dL — ABNORMAL HIGH (ref 65–99)
Glucose-Capillary: 180 mg/dL — ABNORMAL HIGH (ref 65–99)

## 2017-08-13 LAB — CBC WITH DIFFERENTIAL/PLATELET
BASOS PCT: 0 %
Basophils Absolute: 0 10*3/uL (ref 0–0.1)
Eosinophils Absolute: 0.1 10*3/uL (ref 0–0.7)
Eosinophils Relative: 1 %
HEMATOCRIT: 49.5 % — AB (ref 35.0–47.0)
HEMOGLOBIN: 15.2 g/dL (ref 12.0–16.0)
Lymphocytes Relative: 14 %
Lymphs Abs: 1.6 10*3/uL (ref 1.0–3.6)
MCH: 24.4 pg — ABNORMAL LOW (ref 26.0–34.0)
MCHC: 30.6 g/dL — ABNORMAL LOW (ref 32.0–36.0)
MCV: 79.8 fL — ABNORMAL LOW (ref 80.0–100.0)
MONO ABS: 1 10*3/uL — AB (ref 0.2–0.9)
Monocytes Relative: 9 %
NEUTROS ABS: 8.9 10*3/uL — AB (ref 1.4–6.5)
NEUTROS PCT: 76 %
Platelets: 293 10*3/uL (ref 150–440)
RBC: 6.2 MIL/uL — AB (ref 3.80–5.20)
RDW: 21.9 % — AB (ref 11.5–14.5)
WBC: 11.8 10*3/uL — AB (ref 3.6–11.0)

## 2017-08-13 LAB — MAGNESIUM: MAGNESIUM: 1.9 mg/dL (ref 1.7–2.4)

## 2017-08-13 MED ORDER — VANCOMYCIN HCL IN DEXTROSE 1-5 GM/200ML-% IV SOLN
1000.0000 mg | Freq: Two times a day (BID) | INTRAVENOUS | Status: DC
  Administered 2017-08-13 – 2017-08-15 (×4): 1000 mg via INTRAVENOUS
  Filled 2017-08-13 (×5): qty 200

## 2017-08-13 MED ORDER — BACITRACIN-NEOMYCIN-POLYMYXIN 400-5-5000 EX OINT
TOPICAL_OINTMENT | Freq: Every day | CUTANEOUS | Status: DC
  Administered 2017-08-13: 1 via TOPICAL
  Filled 2017-08-13 (×2): qty 1

## 2017-08-13 MED ORDER — SODIUM CHLORIDE 0.9 % IV SOLN
3.0000 g | Freq: Four times a day (QID) | INTRAVENOUS | Status: DC
  Administered 2017-08-13 – 2017-08-15 (×8): 3 g via INTRAVENOUS
  Filled 2017-08-13 (×12): qty 3

## 2017-08-13 MED ORDER — SODIUM CHLORIDE 0.45 % IV SOLN
INTRAVENOUS | Status: DC
  Administered 2017-08-13: 19:00:00 via INTRAVENOUS

## 2017-08-13 MED ORDER — VANCOMYCIN HCL IN DEXTROSE 1-5 GM/200ML-% IV SOLN
1000.0000 mg | Freq: Once | INTRAVENOUS | Status: AC
  Administered 2017-08-13: 1000 mg via INTRAVENOUS
  Filled 2017-08-13: qty 200

## 2017-08-13 MED ORDER — ACETAMINOPHEN 160 MG/5ML PO SOLN
650.0000 mg | Freq: Four times a day (QID) | ORAL | Status: DC | PRN
  Administered 2017-08-13: 650 mg
  Filled 2017-08-13 (×2): qty 20.3

## 2017-08-13 MED ORDER — FREE WATER
200.0000 mL | Status: DC
  Administered 2017-08-13 (×3): 200 mL

## 2017-08-13 NOTE — Progress Notes (Signed)
[place] at [facility]                                                                                                                                                                                  Patient Demographics   [name], is a 52 y.o. female, DOB - [birth year], MCE:[id number]  Admit date - 07/29/2017   Admitting Physician Vaughan Basta, MD  Outpatient Primary MD for the patient is [name], [name], MD   LOS - 15  Subjective: Patient is on full vent support but she is alert, denies any complaints today  Review of Systems:   Could not be obtained as patient is on ventilator but patient alert, able to communicate with family with lipmovement  Vitals:   Vitals:   08/13/17 1131 08/13/17 1134 08/13/17 1200 08/13/17 1300  BP:   125/78 120/80  Pulse:   (!) 105 (!) 105  Resp:   18 16  Temp:    (!) 101.9 F (38.8 C)  TempSrc:      SpO2: 95% 94% 93% 92%  Weight:      Height:        Wt Readings from Last 3 Encounters:  08/13/17 105.9 kg (233 lb 7.5 oz)  06/12/17 119 kg (262 lb 6.4 oz)  09/29/16 117.9 kg (260 lb)     Intake/Output Summary (Last 24 hours) at 08/13/2017 1433 Last data filed at 08/13/2017 0600 Gross per 24 hour  Intake 1275.3 ml  Output 1080 ml  Net 195.3 ml    Physical Exam:   GENERAL: 52 year old female patient lying on the bed on ventilator FiO2 40% PEEP 5pressure support 10 HEAD, EYES, EARS, NOSE AND THROAT: Atraumatic, normocephalic. Extraocular muscles are intact. Pupils equal and reactive to light. Sclerae anicteric. No conjunctival injection. ET tube noted NECK: Supple. There is no jugular venous distention. No bruits, no lymphadenopathy, no thyromegaly.  HEART: Regular rate and rhythm,. No murmurs, no rubs, no clicks.  LUNGS: Decreased air flow left lung, adequate air flow right lung.  Rales heard in left lung ABDOMEN: Soft, flat, nontender, nondistended. Has good bowel sounds. No hepatosplenomegaly  appreciated.  EXTREMITIES: No evidence of any cyanosis, clubbing, or peripheral edema.  +2 pedal and radial pulses bilaterally.  NEUROLOGIC: The patient is alert, awake  On ventilator SKIN: Moist and warm with no rashes appreciated.  Psych: could not be assessed LN: No inguinal LN enlargement    Antibiotics   Anti-infectives (From admission, onward)   Start     Dose/Rate Route Frequency Ordered Stop   08/13/17 1400  Ampicillin-Sulbactam (UNASYN) 3 g in sodium chloride 0.9 % 100 mL IVPB  3 g 200 mL/hr over 30 Minutes Intravenous Every 6 hours 08/13/17 1253     08/13/17 1000  fluconazole (DIFLUCAN) tablet 100 mg     100 mg Per Tube Daily 08/12/17 1134     08/12/17 1145  fluconazole (DIFLUCAN) IVPB 200 mg     200 mg 100 mL/hr over 60 Minutes Intravenous  Once 08/12/17 1134 08/12/17 1342   08/10/17 1600  piperacillin-tazobactam (ZOSYN) IVPB 3.375 g  Status:  Discontinued     3.375 g 12.5 mL/hr over 240 Minutes Intravenous Every 8 hours 08/10/17 1550 08/13/17 1050   07/31/17 0800  ceFEPIme (MAXIPIME) 2 g in sodium chloride 0.9 % 100 mL IVPB     2 g 200 mL/hr over 30 Minutes Intravenous Every 8 hours 07/31/17 0627 08/07/17 0030   07/29/17 1915  ceFEPIme (MAXIPIME) 2 g in sodium chloride 0.9 % 100 mL IVPB     2 g 200 mL/hr over 30 Minutes Intravenous  Once 07/29/17 1911 07/29/17 2050   07/29/17 1915  vancomycin (VANCOCIN) IVPB 1000 mg/200 mL premix     1,000 mg 200 mL/hr over 60 Minutes Intravenous  Once 07/29/17 1911 07/29/17 2342      Medications   Scheduled Meds: . budesonide (PULMICORT) nebulizer solution  0.5 mg Nebulization BID  . chlorhexidine gluconate (MEDLINE KIT)  15 mL Mouth Rinse BID  . docusate  100 mg Per Tube BID  . famotidine  20 mg Per Tube BID  . feeding supplement (PRO-STAT SUGAR FREE 64)  30 mL Per Tube BID  . feeding supplement (VITAL HIGH PROTEIN)  1,000 mL Per Tube Q24H  . fluconazole  100 mg Per Tube Daily  . free water  200 mL Per Tube Q4H  .  heparin  5,000 Units Subcutaneous Q8H  . insulin aspart  0-15 Units Subcutaneous Q4H  . insulin aspart  2 Units Subcutaneous Q4H  . ipratropium-albuterol  3 mL Nebulization Q4H  . mouth rinse  15 mL Mouth Rinse 10 times per day  . metoCLOPramide (REGLAN) injection  5 mg Intravenous Q12H  . multivitamin  15 mL Per Tube Daily  . senna-docusate  1 tablet Oral BID  . sertraline  50 mg Per Tube QHS  . sodium chloride flush  10-40 mL Intracatheter Q12H   Continuous Infusions: . ampicillin-sulbactam (UNASYN) IV 3 g (08/13/17 1410)  . fentaNYL infusion INTRAVENOUS 100 mcg/hr (08/13/17 0600)   PRN Meds:.acetaminophen (TYLENOL) oral liquid 160 mg/5 mL, albuterol, bisacodyl, fentaNYL, LORazepam, ondansetron (ZOFRAN) IV, sodium chloride flush   Data Review:   Micro Results Recent Results (from the past 240 hour(s))  Culture, respiratory (NON-Expectorated)     Status: None   Collection Time: 08/04/17 11:53 AM  Result Value Ref Range Status   Specimen Description   Final    TRACHEAL ASPIRATE Performed at [facility], [address], [place], [place] [address]    Special Requests   Final    NONE Performed at [facility], [place], [place] [address]    Gram Stain   Final    RARE WBC PRESENT,BOTH PMN AND MONONUCLEAR RARE SQUAMOUS EPITHELIAL CELLS PRESENT RARE GRAM POSITIVE COCCI    Culture   Final    Consistent with normal respiratory flora. Performed at [facility], [place] [address] [place],  [address]    Report Status 08/06/2017 FINAL  Final  Culture, respiratory (NON-Expectorated)     Status: None   Collection Time: 08/10/17  2:32 PM  Result  Value Ref Range Status   Specimen Description TRACHEAL ASPIRATE  Final   Special Requests   Final    NONE Performed at [facility], [address] [place], [place] [address]    Gram Stain   Final    ABUNDANT WBC PRESENT, PREDOMINANTLY PMN RARE SQUAMOUS EPITHELIAL CELLS  PRESENT MODERATE GRAM POSITIVE COCCI IN CHAINS    Culture   Final    FEW Consistent with normal respiratory flora. Performed at [facility], [place] [address] [place], [place] [address]    Report Status 08/12/2017 FINAL  Final    Radiology Reports Dg Chest 2 View  Result Date: 07/30/2017 CLINICAL DATA:  Shortness of breath. Acute on chronic respiratory failure. Hypoxia. Morbid obesity. EXAM: CHEST - 2 VIEW COMPARISON:  Chest x-ray and CT chest 07/29/2017. FINDINGS: Cardiac silhouette is partially silhouetted by large pleural effusion on the LEFT. There is a whiteout of the LEFT hemithorax, which could represent mucous plugging and or increasing effusion. The RIGHT lung demonstrates interstitial edema with small effusion. IMPRESSION: Marked worsening aeration. Complete opacity of the LEFT hemithorax represents a significant change from yesterday's radiograph. Electronically Signed   By: Staci Righter M.D.   On: 07/30/2017 08:45   Dg Chest 2 View  Result Date: 07/29/2017 CLINICAL DATA:  52 year old female with history of chest pain since last week. Pain most severe during deep breathing. EXAM: CHEST - 2 VIEW COMPARISON:  Chest x-ray 06/10/2017. FINDINGS: Left lower lobe airspace consolidation concerning for pneumonia. Small bilateral pleural effusions. Mild diffuse peribronchial cuffing. Mild cardiomegaly. Upper mediastinal contours appear widened, but are stable compared to prior examinations. IMPRESSION: 1. Left lower lobe pneumonia with small bilateral pleural effusions. Followup PA and lateral chest X-ray is recommended in 3-4 weeks following trial of antibiotic therapy to ensure resolution and exclude underlying malignancy. 2. Mild cardiomegaly. Electronically Signed   By: Vinnie Langton M.D.   On: 07/29/2017 17:37   Dg Abd 1 View  Result Date: 08/12/2017 CLINICAL DATA:  Dyspepsia.  Difficulty feeding. EXAM: ABDOMEN - 1 VIEW COMPARISON:  08/07/2017 FINDINGS: The enteric tube tip  projects over the expected location of the distal stomach. Side port is well below GE junction. Cholecystectomy clips noted. No abnormal bowel dilatation identified. IMPRESSION: 1. NG tube tip within the body of stomach. 2. No abnormal bowel dilatation noted. Electronically Signed   By: Kerby Moors M.D.   On: 08/12/2017 10:40   Dg Abd 1 View  Result Date: 08/07/2017 CLINICAL DATA:  Fecal impaction. EXAM: ABDOMEN - 1 VIEW COMPARISON:  07/31/2017 FINDINGS: Nasogastric tube has its tip in the midportion of the stomach. Clips in the right upper quadrant consistent with previous cholecystectomy. Single clip in the right lower quadrant, possibly appendectomy. Moderate amount of fecal matter within the right colon. No sign of obstruction. IMPRESSION: Nasogastric tube in the mid stomach. Moderate amount of fecal matter in the right colon. No sign of obstruction. Electronically Signed   By: Nelson Chimes M.D.   On: 08/07/2017 11:32   Ct Chest Wo Contrast  Result Date: 07/31/2017 CLINICAL DATA:  She presented to [facility] ER on 03/17 with c/o intermittent chest pain worse during inspiration and nonproductive cough onset 1 week prior to presentation. On 03/18 pt developed hypotension and worsening acute hypoxic respiratory failure. EXAM: CT CHEST WITHOUT CONTRAST TECHNIQUE: Multidetector CT imaging of the chest was performed following the standard protocol without IV contrast. COMPARISON:  07/29/2017, 06/10/2017 FINDINGS: Cardiovascular: Stable cardiomegaly. Small pericardial effusion. Dilatation of  main pulmonary artery as well as the right and left main pulmonary arteries as can be seen with pulmonary arterial hypertension. Thoracic aorta is normal in caliber. Mediastinum/Nodes: Enlarged prevascular lymph node measuring 17 mm in short axis. Multiple other smaller mediastinal lymph nodes are noted. Thyroid gland, trachea, and esophagus demonstrate no significant findings. Lungs/Pleura: Trace right pleural effusion. Small  left pleural effusion. Left lower lobe airspace disease with air bronchograms concerning for pneumonia. Improved aeration of left upper lobe. Mild persistent right upper lobe airspace disease likely reflecting atelectasis. Stable 6 mm right middle lobe pulmonary nodule unchanged compared with 09/01/2013. Upper Abdomen: Aortic atherosclerosis. Musculoskeletal: No acute osseous abnormality. No aggressive osseous lesion. IMPRESSION: 1. Left lower lobe airspace disease with air bronchograms concerning for combination of pneumonia and atelectasis. Mild mediastinal lymphadenopathy likely reactive. 2. Small left pleural effusion.  Mild right upper lobe atelectasis. 3.  Aortic Atherosclerosis (ICD10-I70.0). Electronically Signed   By: Kathreen Devoid   On: 07/31/2017 12:47   Ct Angio Chest Pe W And/or Wo Contrast  Result Date: 07/29/2017 CLINICAL DATA:  52 year old female with history of chest pain since last week. Difficulty taking deep breaths. EXAM: CT ANGIOGRAPHY CHEST WITH CONTRAST TECHNIQUE: Multidetector CT imaging of the chest was performed using the standard protocol during bolus administration of intravenous contrast. Multiplanar CT image reconstructions and MIPs were obtained to evaluate the vascular anatomy. CONTRAST:  79m ISOVUE-370 IOPAMIDOL (ISOVUE-370) INJECTION 76% COMPARISON:  Chest CT 06/10/2017. FINDINGS: Cardiovascular: No filling defects within the pulmonary arterial tree to suggest underlying pulmonary embolism. Heart size is normal. Moderate volume of pericardial fluid with pericardial enhancement concerning for pericarditis. No pericardial calcification. There is aortic atherosclerosis, as well as atherosclerosis of the great vessels of the mediastinum and the coronary arteries, including calcified atherosclerotic plaque in the left anterior descending coronary artery. Mediastinum/Nodes: Multiple prominent borderline enlarged and enlarged mediastinal lymph nodes, largest of which is in the  prevascular nodal station measuring up to 22 mm in short axis, increased compared to the prior study. Esophagus is unremarkable in appearance. No axillary lymphadenopathy. Lungs/Pleura: Moderate left pleural effusion with near complete passive atelectasis of the left lower lobe. Some dependent subsegmental atelectasis is also noted in the left upper lobe. Trace right pleural effusion. Throughout the remaining portions of the lungs there is a background of ground-glass attenuation and mild interlobular septal thickening, indicative of a background of mild interstitial pulmonary edema. 5 mm right middle lobe pulmonary nodule (axial image 47 of series 7), stable dating back to at least 09/01/2013, considered definitively benign. Upper Abdomen: Aortic atherosclerosis. Musculoskeletal: There are no aggressive appearing lytic or blastic lesions noted in the visualized portions of the skeleton. Review of the MIP images confirms the above findings. IMPRESSION: 1. No evidence of pulmonary embolism. 2. Extensive pericardial enhancement with moderate volume of pericardial fluid, indicative of an acute pericarditis. 3. This is associated with increasing mediastinal lymphadenopathy compared to the prior study. 4. In addition, there is evidence of mild pulmonary edema and bilateral pleural effusions (left greater than right) with extensive areas of passive atelectasis in the left lung. 5. Aortic atherosclerosis, in addition to left anterior descending coronary artery disease. Please note that although the presence of coronary artery calcium documents the presence of coronary artery disease, the severity of this disease and any potential stenosis cannot be assessed on this non-gated CT examination. Assessment for potential risk factor modification, dietary therapy or pharmacologic therapy may be warranted, if clinically indicated. Aortic Atherosclerosis (ICD10-I70.0). Electronically Signed  By: Vinnie Langton M.D.   On:  07/29/2017 19:45   Dg Chest Port 1 View  Result Date: 08/13/2017 CLINICAL DATA:  Pneumonia EXAM: PORTABLE CHEST 1 VIEW COMPARISON:  08/11/2017 FINDINGS: Endotracheal tube in good position. Central venous catheter tip SVC. NG tube in the stomach. Bilateral airspace disease unchanged, most prominent in the lung bases. Small effusions bilaterally. IMPRESSION: Endotracheal tube and central line unchanged in position Diffuse bilateral airspace disease basilar predominant unchanged. CHF versus pneumonia. Electronically Signed   By: Franchot Gallo M.D.   On: 08/13/2017 07:30   Dg Chest Port 1 View  Result Date: 08/11/2017 CLINICAL DATA:  Pneumonia EXAM: PORTABLE CHEST 1 VIEW COMPARISON:  Yesterday FINDINGS: Endotracheal tube tip at the clavicular heads. Left subclavian central line with tip at the distal SVC. An orogastric tube reaches the stomach. Indistinct opacities at the lung bases. Generalized interstitial coarsening that is stable. Unchanged cardiopericardial enlargement. IMPRESSION: 1. Stable positioning of tubes and central line. 2. History of pneumonia with unchanged lung opacity. 3. Cardiopericardial enlargement. Pericardial effusion by recent chest CT. Electronically Signed   By: Monte Fantasia M.D.   On: 08/11/2017 07:45   Dg Chest Port 1 View  Result Date: 08/10/2017 CLINICAL DATA:  Rhonchi, acute on chronic respiratory failure with hypoxia, heart failure, pericardial effusion, pneumonia versus pericarditis, COPD EXAM: PORTABLE CHEST 1 VIEW COMPARISON:  Portable exam 1445 hours compared to 08/08/2017 FINDINGS: Tip of endotracheal tube projects 3.0 cm above carina. Nasogastric tube extends into stomach. LEFT subclavian central venous catheter tip projects over SVC. Enlargement of cardiac silhouette with vascular congestion. Increased RIGHT lung infiltrate. Questionable LEFT lower lobe atelectasis versus consolidation. No gross pleural effusion or pneumothorax. IMPRESSION: Increased interstitial  infiltrates in RIGHT lung with questionable atelectasis versus infiltrate in LEFT lower lobe. Enlargement of cardiac silhouette with pulmonary vascular congestion. Sign report Electronically Signed   By: Lavonia Dana M.D.   On: 08/10/2017 15:08   Dg Chest Port 1 View  Result Date: 08/08/2017 CLINICAL DATA:  Acute respiratory distress. History of acute on chronic respiratory failure, pericardial effusion, COPD, current smoker. EXAM: PORTABLE CHEST 1 VIEW COMPARISON:  Portable chest x-ray of August 07, 2017 FINDINGS: The lungs are well-expanded. Persistent bibasilar interstitial and airspace opacities are present. The cardiac silhouette is enlarged. The pulmonary vascularity is mildly congested. The endotracheal tube tip lies 3.3 cm above the carina. The esophagogastric tube tip projects below the inferior margin of the image. The left internal jugular venous catheter tip projects over the junction of the proximal and midportions of the SVC. IMPRESSION: Bibasilar atelectasis or pneumonia. Mild CHF. The support tubes are in reasonable position. Electronically Signed   By: David  Martinique M.D.   On: 08/08/2017 09:39   Dg Chest Port 1 View  Result Date: 08/07/2017 CLINICAL DATA:  Hypoxia EXAM: PORTABLE CHEST 1 VIEW COMPARISON:  August 05, 2017 FINDINGS: Endotracheal tube tip is 4.0 cm above the carina. Central catheter tip is in superior vena cava. Nasogastric tube tip and side port are below the diaphragm. No pneumothorax. There are small pleural effusions bilaterally. There is consolidation in the left lower lobe as well as to a lesser extent in the right base. Heart is enlarged with pulmonary venous hypertension. No adenopathy. No bone lesions. IMPRESSION: Tube and catheter positions as described without pneumothorax. Bibasilar consolidation, more on the left than on the right, likely multifocal pneumonia. A degree of alveolar edema in the bases is possible. There is underlying pulmonary vascular congestion with  small  pleural effusions bilaterally. Electronically Signed   By: Lowella Grip III M.D.   On: 08/07/2017 07:36   Dg Chest Port 1 View  Result Date: 08/05/2017 CLINICAL DATA:  Acute respiratory failure, COPD, hypertension, smoker EXAM: PORTABLE CHEST 1 VIEW COMPARISON:  Portable exam 0548 hours compared to 08/04/2017 FINDINGS: Tip of endotracheal tube projects 4.1 cm above carina. Nasogastric tube extends into stomach. LEFT subclavian central venous catheter tip projects over SVC. Numerous EKG leads project over chest. Enlargement of cardiac silhouette with pulmonary vascular congestion. Scattered interstitial infiltrates likely representing pulmonary edema. Decreased LEFT pleural effusion. LEFT lower lobe opacification could represent atelectasis or coexistent consolidation. No pneumothorax. IMPRESSION: Persistent pulmonary edema with decreased LEFT pleural effusion. LEFT lower lobe atelectasis versus consolidation. Electronically Signed   By: Lavonia Dana M.D.   On: 08/05/2017 08:11   Dg Chest Port 1 View  Result Date: 08/04/2017 CLINICAL DATA:  Respiratory failure. EXAM: PORTABLE CHEST 1 VIEW COMPARISON:  Yesterday. FINDINGS: Endotracheal tube in satisfactory position. Nasogastric tube extending the stomach. Stable left subclavian catheter. Increased patchy opacity in the perihilar regions bilaterally. No significant change in patchy density interstitial prominence elsewhere in the right lung. Increased left pleural fluid. Stable enlarged cardiac silhouette. No acute bony abnormality. IMPRESSION: 1. Mild worsening of changes of pulmonary edema. 2. Increased left pleural fluid. 3. Stable cardiomegaly. Electronically Signed   By: Claudie Revering M.D.   On: 08/04/2017 07:05   Dg Chest Port 1 View  Result Date: 08/03/2017 CLINICAL DATA:  Acute respiratory failure. EXAM: PORTABLE CHEST 1 VIEW COMPARISON:  Radiograph yesterday at 0456 hour.  Chest CT 07/31/2016 FINDINGS: Endotracheal tube tip at the thoracic  inlet. Enteric tube in place, tip below the diaphragm. Left central line with tip in the proximal SVC. Unchanged cardiomegaly. Unchanged vascular congestion/pulmonary edema. Bibasilar opacities, left greater than right, unchanged. No pneumothorax. IMPRESSION: Unchanged appearance of the chest with cardiomegaly, pulmonary edema/vascular congestion, and bibasilar opacities, left greater than right. Electronically Signed   By: Jeb Levering M.D.   On: 08/03/2017 01:48   Dg Chest Port 1 View  Result Date: 08/02/2017 CLINICAL DATA:  52 year old female with respiratory failure. Left lung pneumonia. EXAM: PORTABLE CHEST 1 VIEW COMPARISON:  08/01/2017, chest CT 07/31/2017, and earlier. FINDINGS: Portable AP semi upright view at 0456 hours. Endotracheal tube tip at the level the clavicles. Enteric tube courses to the abdomen, tip not included. Stable left IJ or subclavian central line. Substantially improved left lung ventilation since 07/31/2017, although residual moderate confluent left lung base opacity remains. Interval increasing opacity at the right lung base. No pneumothorax. Chronic but increased underlying bilateral pulmonary interstitial opacity compared to 2018, possibly mild vascular congestion. Small bilateral pleural effusions are suspected. Stable cardiac size and mediastinal contours. Cardiomegaly and pericardial effusion noted on chest CT and CTA this month. Paucity of bowel gas in the upper abdomen. IMPRESSION: 1.  Stable lines and tubes. 2. Improved left lung multilobar pneumonia since 07/31/2017, but moderate residual at the lung base. 3. Worsening ventilation at the right lung base over that same time suspicious for right lung involvement now. 4. Stable cardiomegaly.  Mild vascular congestion/pulmonary edema. 5. Small bilateral pleural effusions suspected. Electronically Signed   By: Genevie Ann M.D.   On: 08/02/2017 11:00   Dg Chest Port 1 View  Result Date: 08/01/2017 CLINICAL DATA:  Respiratory  failure EXAM: PORTABLE CHEST 1 VIEW COMPARISON:  Chest radiograph and chest CT July 31, 2017 FINDINGS: Endotracheal tube tip is 3.7 cm above  the carina. Central catheter tip is in the superior vena cava. No pneumothorax. There has been significant partial clearing on the left compared to 1 day prior. There has been the significant clearing of diffuse consolidation and effusion. There remains atelectatic change in the left base as well as small left effusion. There has been a clearing of consolidation from the right upper lobe. Currently the right lung is clear except for atelectasis in the left base. There is cardiomegaly with pulmonary venous hypertension. No adenopathy evident. No bone lesions. IMPRESSION: Tube and catheter positions as described without pneumothorax. Significant clearing at the throughout the left lung and right upper lobe. There remains atelectatic change in each lower lung zone as well as underlying pulmonary vascular congestion. There is felt to be small left residual pleural effusion. Electronically Signed   By: Lowella Grip III M.D.   On: 08/01/2017 07:56   Dg Chest Port 1 View  Result Date: 07/31/2017 CLINICAL DATA:  Intubation and central line placement. EXAM: PORTABLE CHEST 1 VIEW COMPARISON:  Chest x-ray from same day at 0529. FINDINGS: Interval placement of an endotracheal tube with the tip approximately 1 cm above the level of the carina. Enteric tube seen entering the stomach. Left subclavian central venous catheter with the tip at the cavoatrial junction. Stable cardiomegaly. Unchanged complete opacification of the left hemithorax due to large pleural effusion and left lung collapse. Increasing airspace disease within the right upper lobe. No acute osseous abnormality. IMPRESSION: 1. Interval placement of an endotracheal tube with the tip approximately 1 cm above the level of the carina. Recommend retraction 2-3 cm. 2. Appropriately positioned left subclavian central venous  catheter. 3. Unchanged large left pleural effusion and left lung collapse. 4. Worsening airspace disease in the right upper lobe which could reflect edema or infection. Electronically Signed   By: Titus Dubin M.D.   On: 07/31/2017 10:51   Dg Chest Port 1 View  Result Date: 07/31/2017 CLINICAL DATA:  Acute respiratory failure EXAM: PORTABLE CHEST 1 VIEW COMPARISON:  07/30/2017 FINDINGS: Dense consolidation of the left hemithorax unchanged due to effusion and collapse. Progression of right lower lobe atelectasis/infiltrate. Diffuse airspace disease on the right may represent edema. IMPRESSION: Persistent collapse of the left lung Progressive right lower lobe atelectasis/infiltrate and small right effusion. Electronically Signed   By: Franchot Gallo M.D.   On: 07/31/2017 07:26   Dg Abd Portable 1v  Result Date: 07/31/2017 CLINICAL DATA:  NG tube placement. EXAM: PORTABLE ABDOMEN - 1 VIEW COMPARISON:  None. FINDINGS: Enteric tube in place with the tip in the distal gastric body. Nonobstructive bowel gas pattern. No acute osseous abnormality. IMPRESSION: Enteric tube appropriately positioned with the tip in the distal gastric body. Electronically Signed   By: Titus Dubin M.D.   On: 07/31/2017 10:52     CBC Recent Labs  Lab 08/08/17 0435 08/10/17 0505 08/11/17 0508 08/13/17 0449  WBC 12.4* 13.6* 11.8* 11.8*  HGB 14.8 16.5* 15.9 15.2  HCT 48.3* 52.7* 53.6* 49.5*  PLT 426 359 337 293  MCV 77.4* 78.6* 79.8* 79.8*  MCH 23.7* 24.7* 23.6* 24.4*  MCHC 30.6* 31.4* 29.6* 30.6*  RDW 21.8* 22.0* 21.9* 21.9*  LYMPHSABS 1.2  --  1.5 1.6  MONOABS 0.7  --  1.4* 1.0*  EOSABS 0.0  --  0.1 0.1  BASOSABS 0.0  --  0.1 0.0    Chemistries  Recent Labs  Lab 08/08/17 0435 08/09/17 0516 08/10/17 0505 08/11/17 0508 08/13/17 0449  NA 138 142  143 148* 150*  K 5.0 4.4 4.4 3.9 3.8  CL 93* 95* 98* 102 103  CO2 34* 35* 34* 34* 30  GLUCOSE 148* 104* 148* 145* 163*  BUN 54* 60* 70* 52* 42*  CREATININE  0.73 0.89 0.80 0.67 0.80  CALCIUM 10.5* 11.1* 10.8* 10.4* 9.4  MG 2.8*  --   --  2.0 1.9  AST  --   --   --   --  19  ALT  --   --   --   --  34  ALKPHOS  --   --   --   --  68  BILITOT  --   --   --   --  1.8*   ------------------------------------------------------------------------------------------------------------------ estimated creatinine clearance is 89.5 mL/min (by C-G formula based on SCr of 0.8 mg/dL). ------------------------------------------------------------------------------------------------------------------ No results for input(s): HGBA1C in the last 72 hours. ------------------------------------------------------------------------------------------------------------------ No results for input(s): CHOL, HDL, LDLCALC, TRIG, CHOLHDL, LDLDIRECT in the last 72 hours. ------------------------------------------------------------------------------------------------------------------ No results for input(s): TSH, T4TOTAL, T3FREE, THYROIDAB in the last 72 hours.  Invalid input(s): FREET3 ------------------------------------------------------------------------------------------------------------------ No results for input(s): VITAMINB12, FOLATE, FERRITIN, TIBC, IRON, RETICCTPCT in the last 72 hours.  Coagulation profile No results for input(s): INR, PROTIME in the last 168 hours.  No results for input(s): DDIMER in the last 72 hours.  Cardiac Enzymes No results for input(s): CKMB, TROPONINI, MYOGLOBIN in the last 168 hours.  Invalid input(s): CK ------------------------------------------------------------------------------------------------------------------ Invalid input(s): POCBNP    Assessment & Plan  52 year old female patient currently in ICU for acute on chronic respiratory failure, pericardial effusion and heart failure exacerbation.  1. Acute on chronic respiratory failure with hypoxia On ventilator breathing spontaneouly Off sedation Spontaneous breathing  trials Extubation on Tuesday in OR with ENT at bedside is the plan Status post bronchoscopy no endobronchial tumors, mass and foreign body Appreciate intensivist management  2. Acute on chronic diastolic heart failure Lasix as needed for diuresis ,seen by cardiology.  3.  Left lung pneumonia versus pericarditis  Opacification of left hemithorax secondary to atelectasis and effusion  4.Pericardial effusion with no evidence of pericardial tamponade Cardiology follow-up appreciated  5.tolerating the tube feeding now, started on Reglan.  6.  DVT prophylaxis with subcu heparin 5000 units every 8 hourly Discussed with family especially the daughter at bedside.     Code Status Orders  (From admission, onward)        Start     Ordered   07/29/17 2224  Full code  Continuous     07/29/17 2223    Code Status History    Date Active Date Inactive Code Status Order ID Comments User Context   06/07/2017 1347 06/12/2017 2013 Full Code [id number]  Bettey Costa, MD Inpatient      Time Spent in minutes   35 minutes  Greater than 50% of time spent in care coordination and counseling patient regarding the condition and plan of care.   Epifanio Lesches M.D on 08/13/2017 at 2:33 PM  Between 7am to 6pm - Pager - [phone]  After 6pm go to [email or web address] - Proofreader  [employer]   Office  [phone]

## 2017-08-13 NOTE — Progress Notes (Signed)
SUBJECTIVE: Remains intubated, resting   Vitals:   08/13/17 0400 08/13/17 0500 08/13/17 0600 08/13/17 0700  BP: 118/70 121/80 120/77 122/76  Pulse: (!) 112 (!) 119 (!) 109 (!) 107  Resp: 18 19 17 18   Temp: 98.9 F (37.2 C)     TempSrc: Axillary     SpO2: (!) 88% 91% 91% 92%  Weight:  233 lb 7.5 oz (105.9 kg)    Height:        Intake/Output Summary (Last 24 hours) at 08/13/2017 0843 Last data filed at 08/13/2017 0600 Gross per 24 hour  Intake 1285.3 ml  Output 1580 ml  Net -294.7 ml    LABS: Basic Metabolic Panel: Recent Labs    08/11/17 0508 08/13/17 0449  NA 148* 143  K 3.9 3.8  CL 102 103  CO2 34* 30  GLUCOSE 145* 163*  BUN 52* 42*  CREATININE 0.67 0.80  CALCIUM 10.4* 9.4  MG 2.0 1.9  PHOS 3.7 3.8   Liver Function Tests: Recent Labs    08/13/17 0449  AST 19  ALT 34  ALKPHOS 68  BILITOT 1.8*  PROT 7.7  ALBUMIN 3.5   No results for input(s): LIPASE, AMYLASE in the last 72 hours. CBC: Recent Labs    08/11/17 0508 08/13/17 0449  WBC 11.8* 11.8*  NEUTROABS 8.8* 8.9*  HGB 15.9 15.2  HCT 53.6* 49.5*  MCV 79.8* 79.8*  PLT 337 293   Cardiac Enzymes: No results for input(s): CKTOTAL, CKMB, CKMBINDEX, TROPONINI in the last 72 hours. BNP: Invalid input(s): POCBNP D-Dimer: No results for input(s): DDIMER in the last 72 hours. Hemoglobin A1C: No results for input(s): HGBA1C in the last 72 hours. Fasting Lipid Panel: No results for input(s): CHOL, HDL, LDLCALC, TRIG, CHOLHDL, LDLDIRECT in the last 72 hours. Thyroid Function Tests: No results for input(s): TSH, T4TOTAL, T3FREE, THYROIDAB in the last 72 hours.  Invalid input(s): FREET3 Anemia Panel: No results for input(s): VITAMINB12, FOLATE, FERRITIN, TIBC, IRON, RETICCTPCT in the last 72 hours.   PHYSICAL EXAM General: Intubated, sleeping heavily HEENT:  Normocephalic and atramatic Neck:  No JVD.  Lungs: Clear bilaterally to auscultation and percussion. Heart: HRRR . Normal S1 and S2 without  gallops or murmurs.  Abdomen: Bowel sounds are positive, abdomen soft and non-tender  Msk:  Back normal, normal gait. Normal strength and tone for age. Extremities: No clubbing, cyanosis or edema.   Neuro: Intubated, asleep Psych:  Intubated, asleep  TELEMETRY: Sinus tachycardia 107  ASSESSMENT AND PLAN: Acute on chronic respiratory failure secondary secondary to COPD and CHF exacerbation with small pericardial effusion with no tamponade. Remains intubated. Planned extubation tomorrow in OR with ENT at beside. Heart rate is borderline high but blood pressures are stable, advise proceeding with planned extubation.   Principal Problem:   Acute on chronic respiratory failure with hypoxia (HCC) Active Problems:   Acute respiratory failure (HCC)   Pericardial effusion    Caroleen HammanKristin Abhiraj Dozal, NP-C 08/13/2017 8:43 AM Cell: [phone]

## 2017-08-13 NOTE — Progress Notes (Signed)
Lab called 13:43 regarding a corrected sodium level of 150. Dr Duanne LimerickSamaan notified. Orders to increase flushes to 200 ml every 4 hrs.

## 2017-08-13 NOTE — Consult Note (Signed)
[name], [name] [id number] April 13, 1966 [name], MD  Reason for Consult: respiratory failure Requesting Physician: Epifanio Lesches, MD Consulting Physician: [name], MD  HPI: This 52 y.o. year old female was admitted on 07/29/2017 for Pericardial effusion [I31.3] Acute on chronic respiratory failure ([place]) [J96.20] Acute on chronic respiratory failure with hypoxia ([place]) [J96.21]. Stable over weekend, so plan is to try extubation in the OR tomorrow.   Medications:  Current Facility-Administered Medications  Medication Dose Route Frequency Provider Last Rate Last Dose  . acetaminophen (TYLENOL) solution 650 mg  650 mg Per Tube Q6H PRN Cassandria Santee, MD   650 mg at 08/13/17 1405  . albuterol (PROVENTIL) (2.5 MG/3ML) 0.083% nebulizer solution 2.5 mg  2.5 mg Inhalation Q3H PRN Wilhelmina Mcardle, MD   2.5 mg at 08/08/17 0618  . Ampicillin-Sulbactam (UNASYN) 3 g in sodium chloride 0.9 % 100 mL IVPB  3 g Intravenous Q6H Cassandria Santee, MD   Stopped at 08/13/17 1440  . bisacodyl (DULCOLAX) suppository 10 mg  10 mg Rectal Daily PRN Wilhelmina Mcardle, MD   10 mg at 08/04/17 1412  . budesonide (PULMICORT) nebulizer solution 0.5 mg  0.5 mg Nebulization BID Lafayette Dragon, MD   0.5 mg at 08/13/17 0800  . chlorhexidine gluconate (MEDLINE KIT) (PERIDEX) 0.12 % solution 15 mL  15 mL Mouth Rinse BID Flora Lipps, MD   15 mL at 08/13/17 0809  . docusate (COLACE) 50 MG/5ML liquid 100 mg  100 mg Per Tube BID Wilhelmina Mcardle, MD   100 mg at 08/13/17 0956  . famotidine (PEPCID) tablet 20 mg  20 mg Per Tube BID Wilhelmina Mcardle, MD   20 mg at 08/13/17 0956  . feeding supplement (PRO-STAT SUGAR FREE 64) liquid 30 mL  30 mL Per Tube BID Wilhelmina Mcardle, MD   30 mL at 08/13/17 0957  . feeding supplement (VITAL HIGH PROTEIN) liquid 1,000 mL  1,000 mL Per Tube Q24H Wilhelmina Mcardle, MD   1,000 mL at 08/13/17 1430  . fentaNYL (SUBLIMAZE) bolus via infusion 50 mcg  50 mcg Intravenous Q1H PRN Wilhelmina Mcardle, MD   50 mcg at 08/12/17 0356  . fentaNYL 2516mg in NS 2510m(1065mml) infusion-PREMIX  25-400 mcg/hr Intravenous Continuous SimWilhelmina McardleD 10 mL/hr at 08/13/17 0600 100 mcg/hr at 08/13/17 0600  . fluconazole (DIFLUCAN) tablet 100 mg  100 mg Per Tube Daily Coffee, GarDonna ChristenPH   100 mg at 08/13/17 0958  . free water 200 mL  200 mL Per Tube Q4H Samaan, Maged, MD   200 mL at 08/13/17 1409  . heparin injection 5,000 Units  5,000 Units Subcutaneous Q8H VacVaughan BastaD   5,000 Units at 08/13/17 1307  . insulin aspart (novoLOG) injection 0-15 Units  0-15 Units Subcutaneous Q4H SimWilhelmina McardleD   3 Units at 08/13/17 1608  . insulin aspart (novoLOG) injection 2 Units  2 Units Subcutaneous Q4H SamCassandria SanteeD   2 Units at 08/13/17 1608  . ipratropium-albuterol (DUONEB) 0.5-2.5 (3) MG/3ML nebulizer solution 3 mL  3 mL Nebulization Q4H RicLafayette DragonD   3 mL at 08/13/17 1510  . LORazepam (ATIVAN) injection 2 mg  2 mg Intravenous Q4H PRN SamCassandria SanteeD   2 mg at 08/12/17 0909  . MEDLINE mouth rinse  15 mL Mouth Rinse 10 times per day KasFlora LippsD   15 mL at 08/13/17 1602  . metoCLOPramide (REGLAN) injection 5 mg  5 mg  Intravenous Q12H Epifanio Lesches, MD   5 mg at 08/13/17 0957  . multivitamin liquid 15 mL  15 mL Per Tube Daily Wilhelmina Mcardle, MD   15 mL at 08/13/17 0956  . ondansetron (ZOFRAN) injection 4 mg  4 mg Intravenous Q8H PRN Anders Simmonds, MD   4 mg at 08/10/17 0000  . senna-docusate (Senokot-S) tablet 1 tablet  1 tablet Oral BID Lafayette Dragon, MD   1 tablet at 08/13/17 0957  . sertraline (ZOLOFT) 20 MG/ML concentrated solution 50 mg  50 mg Per Tube QHS Awilda Bill, NP   50 mg at 08/12/17 2156  . sodium chloride flush (NS) 0.9 % injection 10-40 mL  10-40 mL Intracatheter PRN Fritzi Mandes, MD      . sodium chloride flush (NS) 0.9 % injection 10-40 mL  10-40 mL Intracatheter Q12H Lafayette Dragon, MD   10 mL at 08/13/17 1015  .  Medications  Prior to Admission  Medication Sig Dispense Refill  . albuterol (PROVENTIL HFA;VENTOLIN HFA) 108 (90 Base) MCG/ACT inhaler Inhale 2 puffs into the lungs every 6 (six) hours as needed for wheezing or shortness of breath. 1 Inhaler 2  . amitriptyline (ELAVIL) 10 MG tablet Take 10 mg by mouth at bedtime as needed.     [place] Kitchen amLODipine (NORVASC) 5 MG tablet Take 5 mg by mouth daily.    [place] Kitchen aspirin EC 81 MG tablet Take 81 mg by mouth daily.    [place] Kitchen buPROPion (WELLBUTRIN SR) 100 MG 12 hr tablet Take 100 mg by mouth 2 (two) times daily.    . clonazePAM (KLONOPIN) 0.5 MG tablet Take 0.5 mg by mouth 3 (three) times daily as needed.     . cyclobenzaprine (FLEXERIL) 10 MG tablet Take 10 mg by mouth 3 (three) times daily as needed.    . furosemide (LASIX) 20 MG tablet Take 1 tablet (20 mg total) by mouth 2 (two) times daily. (Patient taking differently: Take 20 mg by mouth daily. ) 60 tablet 0  . Mometasone Furoate 200 MCG/ACT AERO Inhale 2 puffs into the lungs.    . naproxen sodium (ALEVE) 220 MG tablet Take 220 mg by mouth daily as needed.    . nitroGLYCERIN (NITROSTAT) 0.4 MG SL tablet Place 0.4 mg under the tongue as needed.    . senna-docusate (SENOKOT-S) 8.6-50 MG tablet Take 1 tablet by mouth at bedtime as needed for mild constipation.      Allergies: No Known Allergies  PMH:  Past Medical History:  Diagnosis Date  . Anxiety   . COPD (chronic obstructive pulmonary disease) ([place])   . Essential hypertension   . History of echocardiogram    a. 08/2013 Echo: EF 55-60%, impaired LV relaxation, mild LVH, nl RV fxn, nl RVSP, mild TR; b. 05/2017 Echo: EF 65-70%, no rwma, nl RV fxn.  . Morbid obesity ([place])   . Tobacco abuse     Fam Hx:  Family History  Problem Relation Age of Onset  . Dementia Mother   . Heart attack Father     Soc Hx:  Social History   Socioeconomic History  . Marital status: Single    Spouse name: Not on file  . Number of children: Not on file  . Years of education: Not on file   . Highest education level: Not on file  Occupational History  . Not on file  Social Needs  . Financial resource strain: Not on file  . Food insecurity:  Worry: Not on file    Inability: Not on file  . Transportation needs:    Medical: Not on file    Non-medical: Not on file  Tobacco Use  . Smoking status: Current Every Day Smoker    Packs/day: 0.75    Years: 40.00    Pack years: 30.00    Types: Cigarettes  . Smokeless tobacco: Never Used  Substance and Sexual Activity  . Alcohol use: No  . Drug use: No  . Sexual activity: Not on file  Lifestyle  . Physical activity:    Days per week: Not on file    Minutes per session: Not on file  . Stress: Not on file  Relationships  . Social connections:    Talks on phone: Not on file    Gets together: Not on file    Attends religious service: Not on file    Active member of club or organization: Not on file    Attends meetings of clubs or organizations: Not on file    Relationship status: Not on file  . Intimate partner violence:    Fear of current or ex partner: Not on file    Emotionally abused: Not on file    Physically abused: Not on file    Forced sexual activity: Not on file  Other Topics Concern  . Not on file  Social History Narrative   Lives in [place] with her mother, who has dementia.  She takes care of her mother and otw does not work.  She does not routinely exercise.    PSH:  Past Surgical History:  Procedure Laterality Date  . ABDOMINAL HYSTERECTOMY    . CHOLECYSTECTOMY    . HERNIA REPAIR    . Procedures since admission: No admission procedures for hospital encounter.  ROS: Review of systems normal other than 12 systems except per HPI.  PHYSICAL EXAM  Vitals: Blood pressure 124/69, pulse (!) 112, temperature 100.2 F (37.9 C), resp. rate 17, height 5' (1.524 m), weight 233 lb 7.5 oz (105.9 kg), SpO2 93 %.. General: Well-developed, Well-nourished in no acute distress Mood: Mood and affect well  adjusted, pleasant and cooperative. Sedated Orientation: Sedated but able to communicate a little by writing head and Face: NCAT. No facial asymmetry. No visible skin lesions. No significant facial scars. No tenderness with sinus percussion. Facial strength normal and symmetric. Neck: Supple and symmetric with no palpable masses, tenderness or crepitance. The trachea is midline. Thyroid gland is soft, nontender and symmetric with no masses or enlargement. Parotid and submandibular glands are soft, nontender and symmetric, without masses. Lymphatic: Cervical lymph nodes are without palpable lymphadenopathy or tenderness. Respiratory: intubated on vent, 8.0 ETT. Mild rales in lung bases Cardiovascular: Carotid pulse shows regular rate and rhythm  MEDICAL DECISION MAKING: Data Review:  Results for orders placed or performed during the hospital encounter of 07/29/17 (from the past 48 hour(s))  Glucose, capillary     Status: Abnormal   Collection Time: 08/11/17  8:08 PM  Result Value Ref Range   Glucose-Capillary 136 (H) 65 - 99 mg/dL   Comment 1 Notify RN   Glucose, capillary     Status: Abnormal   Collection Time: 08/11/17 11:55 PM  Result Value Ref Range   Glucose-Capillary 166 (H) 65 - 99 mg/dL  Glucose, capillary     Status: Abnormal   Collection Time: 08/12/17  3:55 AM  Result Value Ref Range   Glucose-Capillary 155 (H) 65 - 99 mg/dL  Procalcitonin  Status: None   Collection Time: 08/12/17  5:28 AM  Result Value Ref Range   Procalcitonin <0.10 ng/mL    Comment:        Interpretation: PCT (Procalcitonin) <= 0.5 ng/mL: Systemic infection (sepsis) is not likely. Local bacterial infection is possible. (NOTE)       Sepsis PCT Algorithm           Lower Respiratory Tract                                      Infection PCT Algorithm    ----------------------------     ----------------------------         PCT < 0.25 ng/mL                PCT < 0.10 ng/mL         Strongly encourage              Strongly discourage   discontinuation of antibiotics    initiation of antibiotics    ----------------------------     -----------------------------       PCT 0.25 - 0.50 ng/mL            PCT 0.10 - 0.25 ng/mL               OR       >80% decrease in PCT            Discourage initiation of                                            antibiotics      Encourage discontinuation           of antibiotics    ----------------------------     -----------------------------         PCT >= 0.50 ng/mL              PCT 0.26 - 0.50 ng/mL               AND        <80% decrease in PCT             Encourage initiation of                                             antibiotics       Encourage continuation           of antibiotics    ----------------------------     -----------------------------        PCT >= 0.50 ng/mL                  PCT > 0.50 ng/mL               AND         increase in PCT                  Strongly encourage                                      initiation of antibiotics    Strongly  encourage escalation           of antibiotics                                     -----------------------------                                           PCT <= 0.25 ng/mL                                                 OR                                        > 80% decrease in PCT                                     Discontinue / Do not initiate                                             antibiotics Performed at [facility], [address] [place], [place] [address]   Glucose, capillary     Status: Abnormal   Collection Time: 08/12/17  7:38 AM  Result Value Ref Range   Glucose-Capillary 161 (H) 65 - 99 mg/dL  Glucose, capillary     Status: Abnormal   Collection Time: 08/12/17 11:37 AM  Result Value Ref Range   Glucose-Capillary 161 (H) 65 - 99 mg/dL  Glucose, capillary     Status: Abnormal   Collection Time: 08/12/17  3:45 PM  Result Value Ref Range   Glucose-Capillary 144  (H) 65 - 99 mg/dL  Glucose, capillary     Status: Abnormal   Collection Time: 08/12/17  8:16 PM  Result Value Ref Range   Glucose-Capillary 152 (H) 65 - 99 mg/dL   Comment 1 Notify RN   Glucose, capillary     Status: Abnormal   Collection Time: 08/12/17 11:49 PM  Result Value Ref Range   Glucose-Capillary 152 (H) 65 - 99 mg/dL   Comment 1 Notify RN   Glucose, capillary     Status: Abnormal   Collection Time: 08/13/17  4:43 AM  Result Value Ref Range   Glucose-Capillary 165 (H) 65 - 99 mg/dL  Blood gas, arterial     Status: Abnormal (Preliminary result)   Collection Time: 08/13/17  4:46 AM  Result Value Ref Range   FIO2 PENDING    Delivery systems VENTILATOR    Mode PRESSURE REGULATED VOLUME CONTROL    VT 450 mL   Peep/cpap 5.0 cm H20   pH, Arterial 7.38 7.350 - 7.450   pCO2 arterial 60 (H) 32.0 - 48.0 mmHg   pO2, Arterial 71 (L) 83.0 - 108.0 mmHg   Bicarbonate 35.5 (H) 20.0 - 28.0 mmol/L   Acid-Base Excess 7.8 (H) 0.0 - 2.0 mmol/L   O2 Saturation 93.7 %   Patient temperature  37.0    Collection site RIGHT RADIAL    Sample type ARTERIAL DRAW    Allens test (pass/fail) PASS PASS   Mechanical Rate 15     Comment: Performed at [facility], [address] [place], [place] [address]  Comprehensive metabolic panel     Status: Abnormal   Collection Time: 08/13/17  4:49 AM  Result Value Ref Range   Sodium 150 (H) 135 - 145 mmol/L    Comment: CORRECTED RESULTS SANDRA BORBA AT 1337 08/13/17 DAS CORRECTED ON 04/01 AT 1340: PREVIOUSLY REPORTED AS 143    Potassium 3.8 3.5 - 5.1 mmol/L   Chloride 103 101 - 111 mmol/L   CO2 30 22 - 32 mmol/L   Glucose, Bld 163 (H) 65 - 99 mg/dL   BUN 42 (H) 6 - 20 mg/dL   Creatinine, Ser 0.80 0.44 - 1.00 mg/dL   Calcium 9.4 8.9 - 10.3 mg/dL   Total Protein 7.7 6.5 - 8.1 g/dL   Albumin 3.5 3.5 - 5.0 g/dL   AST 19 15 - 41 U/L   ALT 34 14 - 54 U/L   Alkaline Phosphatase 68 38 - 126 U/L   Total Bilirubin 1.8 (H) 0.3 - 1.2 mg/dL   GFR calc  non Af Amer >60 >60 mL/min   GFR calc Af Amer >60 >60 mL/min    Comment: (NOTE) The eGFR has been calculated using the CKD EPI equation. This calculation has not been validated in all clinical situations. eGFR's persistently <60 mL/min signify possible Chronic Kidney Disease.    Anion gap 17 (H) 5 - 15    Comment: Performed at [facility], [address] [place], [place] [address] CORRECTED ON 04/01 AT 1340: PREVIOUSLY REPORTED AS 10   CBC with Differential/Platelet     Status: Abnormal   Collection Time: 08/13/17  4:49 AM  Result Value Ref Range   WBC 11.8 (H) 3.6 - 11.0 K/uL   RBC 6.20 (H) 3.80 - 5.20 MIL/uL   Hemoglobin 15.2 12.0 - 16.0 g/dL    Comment: RESULT REPEATED AND VERIFIED   HCT 49.5 (H) 35.0 - 47.0 %    Comment: RESULT REPEATED AND VERIFIED   MCV 79.8 (L) 80.0 - 100.0 fL   MCH 24.4 (L) 26.0 - 34.0 pg   MCHC 30.6 (L) 32.0 - 36.0 g/dL   RDW 21.9 (H) 11.5 - 14.5 %   Platelets 293 150 - 440 K/uL   Neutrophils Relative % 76 %   Neutro Abs 8.9 (H) 1.4 - 6.5 K/uL   Lymphocytes Relative 14 %   Lymphs Abs 1.6 1.0 - 3.6 K/uL   Monocytes Relative 9 %   Monocytes Absolute 1.0 (H) 0.2 - 0.9 K/uL   Eosinophils Relative 1 %   Eosinophils Absolute 0.1 0 - 0.7 K/uL   Basophils Relative 0 %   Basophils Absolute 0.0 0 - 0.1 K/uL    Comment: Performed at [facility], [address] [place], [place] [address]  Magnesium     Status: None   Collection Time: 08/13/17  4:49 AM  Result Value Ref Range   Magnesium 1.9 1.7 - 2.4 mg/dL    Comment: Performed at [facility], [address] [place], [place] [address]  Phosphorus     Status: None   Collection Time: 08/13/17  4:49 AM  Result Value Ref Range   Phosphorus 3.8 2.5 - 4.6 mg/dL    Comment: Performed at [facility], [address],  [place], [place] [address]  Glucose, capillary     Status: Abnormal   Collection Time: 08/13/17  7:54 AM  Result Value Ref Range   Glucose-Capillary  157 (H) 65 - 99 mg/dL  Glucose, capillary     Status: Abnormal   Collection Time: 08/13/17 12:04 PM  Result Value Ref Range   Glucose-Capillary 157 (H) 65 - 99 mg/dL  Glucose, capillary     Status: Abnormal   Collection Time: 08/13/17  4:06 PM  Result Value Ref Range   Glucose-Capillary 162 (H) 65 - 99 mg/dL  . Dg Abd 1 View  Result Date: 08/12/2017 CLINICAL DATA:  Dyspepsia.  Difficulty feeding. EXAM: ABDOMEN - 1 VIEW COMPARISON:  08/07/2017 FINDINGS: The enteric tube tip projects over the expected location of the distal stomach. Side port is well below GE junction. Cholecystectomy clips noted. No abnormal bowel dilatation identified. IMPRESSION: 1. NG tube tip within the body of stomach. 2. No abnormal bowel dilatation noted. Electronically Signed   By: Kerby Moors M.D.   On: 08/12/2017 10:40   Dg Chest Port 1 View  Result Date: 08/13/2017 CLINICAL DATA:  Pneumonia EXAM: PORTABLE CHEST 1 VIEW COMPARISON:  08/11/2017 FINDINGS: Endotracheal tube in good position. Central venous catheter tip SVC. NG tube in the stomach. Bilateral airspace disease unchanged, most prominent in the lung bases. Small effusions bilaterally. IMPRESSION: Endotracheal tube and central line unchanged in position Diffuse bilateral airspace disease basilar predominant unchanged. CHF versus pneumonia. Electronically Signed   By: Franchot Gallo M.D.   On: 08/13/2017 07:30  .   ASSESSMENT: Respiratory failure with some weaning success but no cuff leak. Difficult airway  PLAN: Hope for successful extubation in OR tomorrow, but if she has difficulty maintaining her airway, she may need emergent reintubation. If we have to re-intubate would go ahead and perform trach. There is also a risk she might require emergent trach if unable to secure her airway. This was discussed in detail with her daughter over the phone.  Please hold tube feeds and heparin after midnight Wean sedation tomorrow after 3 am to increase odds of  successful extubation   [name], MD 08/13/2017 4:48 PM

## 2017-08-13 NOTE — Progress Notes (Addendum)
[place] at [facility]                                                                                                                                                                                  Patient Demographics   Aftan Vint, is a 52 y.o. female, DOB - 03/16/1966, VVO:[id number]  Admit date - 07/29/2017   Admitting Physician Vaughan Basta, MD  Outpatient Primary MD for the patient is [name], [name], MD   LOS - 15  Subjective: Denies any abdominal pain, denies any complaints.  Review of Systems:   Could not be obtained as patient is on ventilator but patient alert, able to communicate with family with lipmovement  Vitals:   Vitals:   08/13/17 1131 08/13/17 1134 08/13/17 1200 08/13/17 1300  BP:   125/78 120/80  Pulse:   (!) 105 (!) 105  Resp:   18 16  Temp:    (!) 101.9 F (38.8 C)  TempSrc:      SpO2: 95% 94% 93% 92%  Weight:      Height:        Wt Readings from Last 3 Encounters:  08/13/17 105.9 kg (233 lb 7.5 oz)  06/12/17 119 kg (262 lb 6.4 oz)  09/29/16 117.9 kg (260 lb)     Intake/Output Summary (Last 24 hours) at 08/13/2017 1430 Last data filed at 08/13/2017 0600 Gross per 24 hour  Intake 1275.3 ml  Output 1080 ml  Net 195.3 ml    Physical Exam:   GENERAL: 52 year old female patient lying on the bed on ventilator FiO2 40% PEEP 5pressure support 10 HEAD, EYES, EARS, NOSE AND THROAT: Atraumatic, normocephalic. Extraocular muscles are intact. Pupils equal and reactive to light. Sclerae anicteric. No conjunctival injection. ET tube noted NECK: Supple. There is no jugular venous distention. No bruits, no lymphadenopathy, no thyromegaly.  HEART: Regular rate and rhythm,. No murmurs, no rubs, no clicks.  LUNGS: Decreased air flow left lung, adequate air flow right lung.  Rales heard in left lung ABDOMEN: Soft, flat, nontender, nondistended. Has good bowel sounds. No hepatosplenomegaly appreciated.  EXTREMITIES:  No evidence of any cyanosis, clubbing, or peripheral edema.  +2 pedal and radial pulses bilaterally.  NEUROLOGIC: The patient is alert, awake  On ventilator SKIN: Moist and warm with no rashes appreciated.  Psych: could not be assessed LN: No inguinal LN enlargement    Antibiotics   Anti-infectives (From admission, onward)   Start     Dose/Rate Route Frequency Ordered Stop   08/13/17 1400  Ampicillin-Sulbactam (UNASYN) 3 g in sodium chloride 0.9 % 100 mL IVPB     3 g 200 mL/hr over 30 Minutes  Intravenous Every 6 hours 08/13/17 1253     08/13/17 1000  fluconazole (DIFLUCAN) tablet 100 mg     100 mg Per Tube Daily 08/12/17 1134     08/12/17 1145  fluconazole (DIFLUCAN) IVPB 200 mg     200 mg 100 mL/hr over 60 Minutes Intravenous  Once 08/12/17 1134 08/12/17 1342   08/10/17 1600  piperacillin-tazobactam (ZOSYN) IVPB 3.375 g  Status:  Discontinued     3.375 g 12.5 mL/hr over 240 Minutes Intravenous Every 8 hours 08/10/17 1550 08/13/17 1050   07/31/17 0800  ceFEPIme (MAXIPIME) 2 g in sodium chloride 0.9 % 100 mL IVPB     2 g 200 mL/hr over 30 Minutes Intravenous Every 8 hours 07/31/17 0627 08/07/17 0030   07/29/17 1915  ceFEPIme (MAXIPIME) 2 g in sodium chloride 0.9 % 100 mL IVPB     2 g 200 mL/hr over 30 Minutes Intravenous  Once 07/29/17 1911 07/29/17 2050   07/29/17 1915  vancomycin (VANCOCIN) IVPB 1000 mg/200 mL premix     1,000 mg 200 mL/hr over 60 Minutes Intravenous  Once 07/29/17 1911 07/29/17 2342      Medications   Scheduled Meds: . budesonide (PULMICORT) nebulizer solution  0.5 mg Nebulization BID  . chlorhexidine gluconate (MEDLINE KIT)  15 mL Mouth Rinse BID  . docusate  100 mg Per Tube BID  . famotidine  20 mg Per Tube BID  . feeding supplement (PRO-STAT SUGAR FREE 64)  30 mL Per Tube BID  . feeding supplement (VITAL HIGH PROTEIN)  1,000 mL Per Tube Q24H  . fluconazole  100 mg Per Tube Daily  . free water  200 mL Per Tube Q4H  . heparin  5,000 Units Subcutaneous  Q8H  . insulin aspart  0-15 Units Subcutaneous Q4H  . insulin aspart  2 Units Subcutaneous Q4H  . ipratropium-albuterol  3 mL Nebulization Q4H  . mouth rinse  15 mL Mouth Rinse 10 times per day  . metoCLOPramide (REGLAN) injection  5 mg Intravenous Q12H  . multivitamin  15 mL Per Tube Daily  . senna-docusate  1 tablet Oral BID  . sertraline  50 mg Per Tube QHS  . sodium chloride flush  10-40 mL Intracatheter Q12H   Continuous Infusions: . ampicillin-sulbactam (UNASYN) IV 3 g (08/13/17 1410)  . fentaNYL infusion INTRAVENOUS 100 mcg/hr (08/13/17 0600)   PRN Meds:.acetaminophen (TYLENOL) oral liquid 160 mg/5 mL, albuterol, bisacodyl, fentaNYL, LORazepam, ondansetron (ZOFRAN) IV, sodium chloride flush   Data Review:   Micro Results Recent Results (from the past 240 hour(s))  Culture, respiratory (NON-Expectorated)     Status: None   Collection Time: 08/04/17 11:53 AM  Result Value Ref Range Status   Specimen Description   Final    TRACHEAL ASPIRATE Performed at [facility], [address], [place], [place] [address]    Special Requests   Final    NONE Performed at [facility], [place], [place] [address]    Gram Stain   Final    RARE WBC PRESENT,BOTH PMN AND MONONUCLEAR RARE SQUAMOUS EPITHELIAL CELLS PRESENT RARE GRAM POSITIVE COCCI    Culture   Final    Consistent with normal respiratory flora. Performed at [facility], [place] [address] [place],  [address]    Report Status 08/06/2017 FINAL  Final  Culture, respiratory (NON-Expectorated)     Status: None   Collection Time: 08/10/17  2:32 PM  Result Value Ref Range Status   Specimen  Description TRACHEAL ASPIRATE  Final   Special Requests   Final    NONE Performed at [facility], [address] [place], [place] [address]    Gram Stain   Final    ABUNDANT WBC PRESENT, PREDOMINANTLY PMN RARE SQUAMOUS EPITHELIAL CELLS PRESENT MODERATE GRAM POSITIVE COCCI IN  CHAINS    Culture   Final    FEW Consistent with normal respiratory flora. Performed at [facility], [place] [address], [place], [place] [address]    Report Status 08/12/2017 FINAL  Final    Radiology Reports Dg Chest 2 View  Result Date: 07/30/2017 CLINICAL DATA:  Shortness of breath. Acute on chronic respiratory failure. Hypoxia. Morbid obesity. EXAM: CHEST - 2 VIEW COMPARISON:  Chest x-ray and CT chest 07/29/2017. FINDINGS: Cardiac silhouette is partially silhouetted by large pleural effusion on the LEFT. There is a whiteout of the LEFT hemithorax, which could represent mucous plugging and or increasing effusion. The RIGHT lung demonstrates interstitial edema with small effusion. IMPRESSION: Marked worsening aeration. Complete opacity of the LEFT hemithorax represents a significant change from yesterday's radiograph. Electronically Signed   By: Staci Righter M.D.   On: 07/30/2017 08:45   Dg Chest 2 View  Result Date: 07/29/2017 CLINICAL DATA:  52 year old female with history of chest pain since last week. Pain most severe during deep breathing. EXAM: CHEST - 2 VIEW COMPARISON:  Chest x-ray 06/10/2017. FINDINGS: Left lower lobe airspace consolidation concerning for pneumonia. Small bilateral pleural effusions. Mild diffuse peribronchial cuffing. Mild cardiomegaly. Upper mediastinal contours appear widened, but are stable compared to prior examinations. IMPRESSION: 1. Left lower lobe pneumonia with small bilateral pleural effusions. Followup PA and lateral chest X-ray is recommended in 3-4 weeks following trial of antibiotic therapy to ensure resolution and exclude underlying malignancy. 2. Mild cardiomegaly. Electronically Signed   By: Vinnie Langton M.D.   On: 07/29/2017 17:37   Dg Abd 1 View  Result Date: 08/12/2017 CLINICAL DATA:  Dyspepsia.  Difficulty feeding. EXAM: ABDOMEN - 1 VIEW COMPARISON:  08/07/2017 FINDINGS: The enteric tube tip projects over the expected location of the  distal stomach. Side port is well below GE junction. Cholecystectomy clips noted. No abnormal bowel dilatation identified. IMPRESSION: 1. NG tube tip within the body of stomach. 2. No abnormal bowel dilatation noted. Electronically Signed   By: Kerby Moors M.D.   On: 08/12/2017 10:40   Dg Abd 1 View  Result Date: 08/07/2017 CLINICAL DATA:  Fecal impaction. EXAM: ABDOMEN - 1 VIEW COMPARISON:  07/31/2017 FINDINGS: Nasogastric tube has its tip in the midportion of the stomach. Clips in the right upper quadrant consistent with previous cholecystectomy. Single clip in the right lower quadrant, possibly appendectomy. Moderate amount of fecal matter within the right colon. No sign of obstruction. IMPRESSION: Nasogastric tube in the mid stomach. Moderate amount of fecal matter in the right colon. No sign of obstruction. Electronically Signed   By: Nelson Chimes M.D.   On: 08/07/2017 11:32   Ct Chest Wo Contrast  Result Date: 07/31/2017 CLINICAL DATA:  She presented to [facility] ER on 03/17 with c/o intermittent chest pain worse during inspiration and nonproductive cough onset 1 week prior to presentation. On 03/18 pt developed hypotension and worsening acute hypoxic respiratory failure. EXAM: CT CHEST WITHOUT CONTRAST TECHNIQUE: Multidetector CT imaging of the chest was performed following the standard protocol without IV contrast. COMPARISON:  07/29/2017, 06/10/2017 FINDINGS: Cardiovascular: Stable cardiomegaly. Small pericardial effusion. Dilatation of main pulmonary artery as well as the  right and left main pulmonary arteries as can be seen with pulmonary arterial hypertension. Thoracic aorta is normal in caliber. Mediastinum/Nodes: Enlarged prevascular lymph node measuring 17 mm in short axis. Multiple other smaller mediastinal lymph nodes are noted. Thyroid gland, trachea, and esophagus demonstrate no significant findings. Lungs/Pleura: Trace right pleural effusion. Small left pleural effusion. Left lower lobe  airspace disease with air bronchograms concerning for pneumonia. Improved aeration of left upper lobe. Mild persistent right upper lobe airspace disease likely reflecting atelectasis. Stable 6 mm right middle lobe pulmonary nodule unchanged compared with 09/01/2013. Upper Abdomen: Aortic atherosclerosis. Musculoskeletal: No acute osseous abnormality. No aggressive osseous lesion. IMPRESSION: 1. Left lower lobe airspace disease with air bronchograms concerning for combination of pneumonia and atelectasis. Mild mediastinal lymphadenopathy likely reactive. 2. Small left pleural effusion.  Mild right upper lobe atelectasis. 3.  Aortic Atherosclerosis (ICD10-I70.0). Electronically Signed   By: Kathreen Devoid   On: 07/31/2017 12:47   Ct Angio Chest Pe W And/or Wo Contrast  Result Date: 07/29/2017 CLINICAL DATA:  52 year old female with history of chest pain since last week. Difficulty taking deep breaths. EXAM: CT ANGIOGRAPHY CHEST WITH CONTRAST TECHNIQUE: Multidetector CT imaging of the chest was performed using the standard protocol during bolus administration of intravenous contrast. Multiplanar CT image reconstructions and MIPs were obtained to evaluate the vascular anatomy. CONTRAST:  75m ISOVUE-370 IOPAMIDOL (ISOVUE-370) INJECTION 76% COMPARISON:  Chest CT 06/10/2017. FINDINGS: Cardiovascular: No filling defects within the pulmonary arterial tree to suggest underlying pulmonary embolism. Heart size is normal. Moderate volume of pericardial fluid with pericardial enhancement concerning for pericarditis. No pericardial calcification. There is aortic atherosclerosis, as well as atherosclerosis of the great vessels of the mediastinum and the coronary arteries, including calcified atherosclerotic plaque in the left anterior descending coronary artery. Mediastinum/Nodes: Multiple prominent borderline enlarged and enlarged mediastinal lymph nodes, largest of which is in the prevascular nodal station measuring up to 22  mm in short axis, increased compared to the prior study. Esophagus is unremarkable in appearance. No axillary lymphadenopathy. Lungs/Pleura: Moderate left pleural effusion with near complete passive atelectasis of the left lower lobe. Some dependent subsegmental atelectasis is also noted in the left upper lobe. Trace right pleural effusion. Throughout the remaining portions of the lungs there is a background of ground-glass attenuation and mild interlobular septal thickening, indicative of a background of mild interstitial pulmonary edema. 5 mm right middle lobe pulmonary nodule (axial image 47 of series 7), stable dating back to at least 09/01/2013, considered definitively benign. Upper Abdomen: Aortic atherosclerosis. Musculoskeletal: There are no aggressive appearing lytic or blastic lesions noted in the visualized portions of the skeleton. Review of the MIP images confirms the above findings. IMPRESSION: 1. No evidence of pulmonary embolism. 2. Extensive pericardial enhancement with moderate volume of pericardial fluid, indicative of an acute pericarditis. 3. This is associated with increasing mediastinal lymphadenopathy compared to the prior study. 4. In addition, there is evidence of mild pulmonary edema and bilateral pleural effusions (left greater than right) with extensive areas of passive atelectasis in the left lung. 5. Aortic atherosclerosis, in addition to left anterior descending coronary artery disease. Please note that although the presence of coronary artery calcium documents the presence of coronary artery disease, the severity of this disease and any potential stenosis cannot be assessed on this non-gated CT examination. Assessment for potential risk factor modification, dietary therapy or pharmacologic therapy may be warranted, if clinically indicated. Aortic Atherosclerosis (ICD10-I70.0). Electronically Signed   By: DMauri BrooklynD.  On: 07/29/2017 19:45   Dg Chest Port 1 View  Result  Date: 08/13/2017 CLINICAL DATA:  Pneumonia EXAM: PORTABLE CHEST 1 VIEW COMPARISON:  08/11/2017 FINDINGS: Endotracheal tube in good position. Central venous catheter tip SVC. NG tube in the stomach. Bilateral airspace disease unchanged, most prominent in the lung bases. Small effusions bilaterally. IMPRESSION: Endotracheal tube and central line unchanged in position Diffuse bilateral airspace disease basilar predominant unchanged. CHF versus pneumonia. Electronically Signed   By: Franchot Gallo M.D.   On: 08/13/2017 07:30   Dg Chest Port 1 View  Result Date: 08/11/2017 CLINICAL DATA:  Pneumonia EXAM: PORTABLE CHEST 1 VIEW COMPARISON:  Yesterday FINDINGS: Endotracheal tube tip at the clavicular heads. Left subclavian central line with tip at the distal SVC. An orogastric tube reaches the stomach. Indistinct opacities at the lung bases. Generalized interstitial coarsening that is stable. Unchanged cardiopericardial enlargement. IMPRESSION: 1. Stable positioning of tubes and central line. 2. History of pneumonia with unchanged lung opacity. 3. Cardiopericardial enlargement. Pericardial effusion by recent chest CT. Electronically Signed   By: Monte Fantasia M.D.   On: 08/11/2017 07:45   Dg Chest Port 1 View  Result Date: 08/10/2017 CLINICAL DATA:  Rhonchi, acute on chronic respiratory failure with hypoxia, heart failure, pericardial effusion, pneumonia versus pericarditis, COPD EXAM: PORTABLE CHEST 1 VIEW COMPARISON:  Portable exam 1445 hours compared to 08/08/2017 FINDINGS: Tip of endotracheal tube projects 3.0 cm above carina. Nasogastric tube extends into stomach. LEFT subclavian central venous catheter tip projects over SVC. Enlargement of cardiac silhouette with vascular congestion. Increased RIGHT lung infiltrate. Questionable LEFT lower lobe atelectasis versus consolidation. No gross pleural effusion or pneumothorax. IMPRESSION: Increased interstitial infiltrates in RIGHT lung with questionable atelectasis  versus infiltrate in LEFT lower lobe. Enlargement of cardiac silhouette with pulmonary vascular congestion. Sign report Electronically Signed   By: Lavonia Dana M.D.   On: 08/10/2017 15:08   Dg Chest Port 1 View  Result Date: 08/08/2017 CLINICAL DATA:  Acute respiratory distress. History of acute on chronic respiratory failure, pericardial effusion, COPD, current smoker. EXAM: PORTABLE CHEST 1 VIEW COMPARISON:  Portable chest x-ray of August 07, 2017 FINDINGS: The lungs are well-expanded. Persistent bibasilar interstitial and airspace opacities are present. The cardiac silhouette is enlarged. The pulmonary vascularity is mildly congested. The endotracheal tube tip lies 3.3 cm above the carina. The esophagogastric tube tip projects below the inferior margin of the image. The left internal jugular venous catheter tip projects over the junction of the proximal and midportions of the SVC. IMPRESSION: Bibasilar atelectasis or pneumonia. Mild CHF. The support tubes are in reasonable position. Electronically Signed   By: David  Martinique M.D.   On: 08/08/2017 09:39   Dg Chest Port 1 View  Result Date: 08/07/2017 CLINICAL DATA:  Hypoxia EXAM: PORTABLE CHEST 1 VIEW COMPARISON:  August 05, 2017 FINDINGS: Endotracheal tube tip is 4.0 cm above the carina. Central catheter tip is in superior vena cava. Nasogastric tube tip and side port are below the diaphragm. No pneumothorax. There are small pleural effusions bilaterally. There is consolidation in the left lower lobe as well as to a lesser extent in the right base. Heart is enlarged with pulmonary venous hypertension. No adenopathy. No bone lesions. IMPRESSION: Tube and catheter positions as described without pneumothorax. Bibasilar consolidation, more on the left than on the right, likely multifocal pneumonia. A degree of alveolar edema in the bases is possible. There is underlying pulmonary vascular congestion with small pleural effusions bilaterally. Electronically Signed  By: Lowella Grip III M.D.   On: 08/07/2017 07:36   Dg Chest Port 1 View  Result Date: 08/05/2017 CLINICAL DATA:  Acute respiratory failure, COPD, hypertension, smoker EXAM: PORTABLE CHEST 1 VIEW COMPARISON:  Portable exam 0548 hours compared to 08/04/2017 FINDINGS: Tip of endotracheal tube projects 4.1 cm above carina. Nasogastric tube extends into stomach. LEFT subclavian central venous catheter tip projects over SVC. Numerous EKG leads project over chest. Enlargement of cardiac silhouette with pulmonary vascular congestion. Scattered interstitial infiltrates likely representing pulmonary edema. Decreased LEFT pleural effusion. LEFT lower lobe opacification could represent atelectasis or coexistent consolidation. No pneumothorax. IMPRESSION: Persistent pulmonary edema with decreased LEFT pleural effusion. LEFT lower lobe atelectasis versus consolidation. Electronically Signed   By: Lavonia Dana M.D.   On: 08/05/2017 08:11   Dg Chest Port 1 View  Result Date: 08/04/2017 CLINICAL DATA:  Respiratory failure. EXAM: PORTABLE CHEST 1 VIEW COMPARISON:  Yesterday. FINDINGS: Endotracheal tube in satisfactory position. Nasogastric tube extending the stomach. Stable left subclavian catheter. Increased patchy opacity in the perihilar regions bilaterally. No significant change in patchy density interstitial prominence elsewhere in the right lung. Increased left pleural fluid. Stable enlarged cardiac silhouette. No acute bony abnormality. IMPRESSION: 1. Mild worsening of changes of pulmonary edema. 2. Increased left pleural fluid. 3. Stable cardiomegaly. Electronically Signed   By: Claudie Revering M.D.   On: 08/04/2017 07:05   Dg Chest Port 1 View  Result Date: 08/03/2017 CLINICAL DATA:  Acute respiratory failure. EXAM: PORTABLE CHEST 1 VIEW COMPARISON:  Radiograph yesterday at 0456 hour.  Chest CT 07/31/2016 FINDINGS: Endotracheal tube tip at the thoracic inlet. Enteric tube in place, tip below the diaphragm.  Left central line with tip in the proximal SVC. Unchanged cardiomegaly. Unchanged vascular congestion/pulmonary edema. Bibasilar opacities, left greater than right, unchanged. No pneumothorax. IMPRESSION: Unchanged appearance of the chest with cardiomegaly, pulmonary edema/vascular congestion, and bibasilar opacities, left greater than right. Electronically Signed   By: Jeb Levering M.D.   On: 08/03/2017 01:48   Dg Chest Port 1 View  Result Date: 08/02/2017 CLINICAL DATA:  52 year old female with respiratory failure. Left lung pneumonia. EXAM: PORTABLE CHEST 1 VIEW COMPARISON:  08/01/2017, chest CT 07/31/2017, and earlier. FINDINGS: Portable AP semi upright view at 0456 hours. Endotracheal tube tip at the level the clavicles. Enteric tube courses to the abdomen, tip not included. Stable left IJ or subclavian central line. Substantially improved left lung ventilation since 07/31/2017, although residual moderate confluent left lung base opacity remains. Interval increasing opacity at the right lung base. No pneumothorax. Chronic but increased underlying bilateral pulmonary interstitial opacity compared to 2018, possibly mild vascular congestion. Small bilateral pleural effusions are suspected. Stable cardiac size and mediastinal contours. Cardiomegaly and pericardial effusion noted on chest CT and CTA this month. Paucity of bowel gas in the upper abdomen. IMPRESSION: 1.  Stable lines and tubes. 2. Improved left lung multilobar pneumonia since 07/31/2017, but moderate residual at the lung base. 3. Worsening ventilation at the right lung base over that same time suspicious for right lung involvement now. 4. Stable cardiomegaly.  Mild vascular congestion/pulmonary edema. 5. Small bilateral pleural effusions suspected. Electronically Signed   By: Genevie Ann M.D.   On: 08/02/2017 11:00   Dg Chest Port 1 View  Result Date: 08/01/2017 CLINICAL DATA:  Respiratory failure EXAM: PORTABLE CHEST 1 VIEW COMPARISON:  Chest  radiograph and chest CT July 31, 2017 FINDINGS: Endotracheal tube tip is 3.7 cm above the carina. Central catheter tip is in  the superior vena cava. No pneumothorax. There has been significant partial clearing on the left compared to 1 day prior. There has been the significant clearing of diffuse consolidation and effusion. There remains atelectatic change in the left base as well as small left effusion. There has been a clearing of consolidation from the right upper lobe. Currently the right lung is clear except for atelectasis in the left base. There is cardiomegaly with pulmonary venous hypertension. No adenopathy evident. No bone lesions. IMPRESSION: Tube and catheter positions as described without pneumothorax. Significant clearing at the throughout the left lung and right upper lobe. There remains atelectatic change in each lower lung zone as well as underlying pulmonary vascular congestion. There is felt to be small left residual pleural effusion. Electronically Signed   By: Lowella Grip III M.D.   On: 08/01/2017 07:56   Dg Chest Port 1 View  Result Date: 07/31/2017 CLINICAL DATA:  Intubation and central line placement. EXAM: PORTABLE CHEST 1 VIEW COMPARISON:  Chest x-ray from same day at 0529. FINDINGS: Interval placement of an endotracheal tube with the tip approximately 1 cm above the level of the carina. Enteric tube seen entering the stomach. Left subclavian central venous catheter with the tip at the cavoatrial junction. Stable cardiomegaly. Unchanged complete opacification of the left hemithorax due to large pleural effusion and left lung collapse. Increasing airspace disease within the right upper lobe. No acute osseous abnormality. IMPRESSION: 1. Interval placement of an endotracheal tube with the tip approximately 1 cm above the level of the carina. Recommend retraction 2-3 cm. 2. Appropriately positioned left subclavian central venous catheter. 3. Unchanged large left pleural effusion and  left lung collapse. 4. Worsening airspace disease in the right upper lobe which could reflect edema or infection. Electronically Signed   By: Titus Dubin M.D.   On: 07/31/2017 10:51   Dg Chest Port 1 View  Result Date: 07/31/2017 CLINICAL DATA:  Acute respiratory failure EXAM: PORTABLE CHEST 1 VIEW COMPARISON:  07/30/2017 FINDINGS: Dense consolidation of the left hemithorax unchanged due to effusion and collapse. Progression of right lower lobe atelectasis/infiltrate. Diffuse airspace disease on the right may represent edema. IMPRESSION: Persistent collapse of the left lung Progressive right lower lobe atelectasis/infiltrate and small right effusion. Electronically Signed   By: Franchot Gallo M.D.   On: 07/31/2017 07:26   Dg Abd Portable 1v  Result Date: 07/31/2017 CLINICAL DATA:  NG tube placement. EXAM: PORTABLE ABDOMEN - 1 VIEW COMPARISON:  None. FINDINGS: Enteric tube in place with the tip in the distal gastric body. Nonobstructive bowel gas pattern. No acute osseous abnormality. IMPRESSION: Enteric tube appropriately positioned with the tip in the distal gastric body. Electronically Signed   By: Titus Dubin M.D.   On: 07/31/2017 10:52     CBC Recent Labs  Lab 08/08/17 0435 08/10/17 0505 08/11/17 0508 08/13/17 0449  WBC 12.4* 13.6* 11.8* 11.8*  HGB 14.8 16.5* 15.9 15.2  HCT 48.3* 52.7* 53.6* 49.5*  PLT 426 359 337 293  MCV 77.4* 78.6* 79.8* 79.8*  MCH 23.7* 24.7* 23.6* 24.4*  MCHC 30.6* 31.4* 29.6* 30.6*  RDW 21.8* 22.0* 21.9* 21.9*  LYMPHSABS 1.2  --  1.5 1.6  MONOABS 0.7  --  1.4* 1.0*  EOSABS 0.0  --  0.1 0.1  BASOSABS 0.0  --  0.1 0.0    Chemistries  Recent Labs  Lab 08/08/17 0435 08/09/17 0516 08/10/17 0505 08/11/17 0508 08/13/17 0449  NA 138 142 143 148* 150*  K 5.0 4.4  4.4 3.9 3.8  CL 93* 95* 98* 102 103  CO2 34* 35* 34* 34* 30  GLUCOSE 148* 104* 148* 145* 163*  BUN 54* 60* 70* 52* 42*  CREATININE 0.73 0.89 0.80 0.67 0.80  CALCIUM 10.5* 11.1* 10.8*  10.4* 9.4  MG 2.8*  --   --  2.0 1.9  AST  --   --   --   --  19  ALT  --   --   --   --  34  ALKPHOS  --   --   --   --  68  BILITOT  --   --   --   --  1.8*   ------------------------------------------------------------------------------------------------------------------ estimated creatinine clearance is 89.5 mL/min (by C-G formula based on SCr of 0.8 mg/dL). ------------------------------------------------------------------------------------------------------------------ No results for input(s): HGBA1C in the last 72 hours. ------------------------------------------------------------------------------------------------------------------ No results for input(s): CHOL, HDL, LDLCALC, TRIG, CHOLHDL, LDLDIRECT in the last 72 hours. ------------------------------------------------------------------------------------------------------------------ No results for input(s): TSH, T4TOTAL, T3FREE, THYROIDAB in the last 72 hours.  Invalid input(s): FREET3 ------------------------------------------------------------------------------------------------------------------ No results for input(s): VITAMINB12, FOLATE, FERRITIN, TIBC, IRON, RETICCTPCT in the last 72 hours.  Coagulation profile No results for input(s): INR, PROTIME in the last 168 hours.  No results for input(s): DDIMER in the last 72 hours.  Cardiac Enzymes No results for input(s): CKMB, TROPONINI, MYOGLOBIN in the last 168 hours.  Invalid input(s): CK ------------------------------------------------------------------------------------------------------------------ Invalid input(s): POCBNP    Assessment & Plan  52 year old female patient currently in ICU for acute on chronic respiratory failure, pericardial effusion and heart failure exacerbation.  1. Acute on chronic respiratory failure with hypoxia On ventilator breathing spontaneouly Off sedation Spontaneous breathing trials Extubation on Tuesday in OR with ENT at  bedside is the plan Status post bronchoscopy no endobronchial tumors, mass and foreign body Appreciate intensivist management  2. Acute on chronic diastolic heart failure Lasix as needed for diuresis, appreciate cardiology following regarding pericardial effusion also.  Patient has no tampon by echocardiogram.  Plan to extubate in the OR  tomorrow.  3.  Left lung pneumonia versus pericarditis  Opacification of left hemithorax secondary to atelectasis and effusion  4.Pericardial effusion with no evidence of pericardial tamponade Cardiology follow-up appreciated  5. tube feeding started along with prokinetic. 6.  DVT prophylaxis with subcu heparin 5000 units every 8 hourly Hyponatremia, patient is on free water via NG tube.     Code Status Orders  (From admission, onward)        Start     Ordered   07/29/17 2224  Full code  Continuous     07/29/17 2223    Code Status History    Date Active Date Inactive Code Status Order ID Comments User Context   06/07/2017 1347 06/12/2017 2013 Full Code [id number]  Bettey Costa, MD Inpatient      Time Spent in minutes   35 minutes  Greater than 50% of time spent in care coordination and counseling patient regarding the condition and plan of care.   Epifanio Lesches M.D on 08/13/2017 at 2:30 PM  Between 7am to 6pm - Pager - [phone]  After 6pm go to [email or web address] - Proofreader  [employer]   Office  [phone]

## 2017-08-13 NOTE — Progress Notes (Signed)
Name: [name] MRN: [id number] DOB: 1966/04/30     CONSULTATION DATE: 07/29/2017  Subjective: No C/O  Objective: Tolerating TF, light sedation    PAST MEDICAL HISTORY :   has a past medical history of Anxiety, COPD (chronic obstructive pulmonary disease) (HCC), Essential hypertension, History of echocardiogram, Morbid obesity (HCC), and Tobacco abuse.  has a past surgical history that includes Cholecystectomy; Abdominal hysterectomy; and Hernia repair. Prior to Admission medications   Medication Sig Start Date End Date Taking? Authorizing Provider  albuterol (PROVENTIL HFA;VENTOLIN HFA) 108 (90 Base) MCG/ACT inhaler Inhale 2 puffs into the lungs every 6 (six) hours as needed for wheezing or shortness of breath. 06/12/17  Yes Gouru, Aruna, MD  amitriptyline (ELAVIL) 10 MG tablet Take 10 mg by mouth at bedtime as needed.  10/13/16 10/13/17 Yes [provider]  amLODipine (NORVASC) 5 MG tablet Take 5 mg by mouth daily. 03/14/17 03/14/18 Yes [provider]  aspirin EC 81 MG tablet Take 81 mg by mouth daily. 05/01/17 01/08/31 Yes [provider]  buPROPion (WELLBUTRIN SR) 100 MG 12 hr tablet Take 100 mg by mouth 2 (two) times daily.   Yes [provider]  clonazePAM (KLONOPIN) 0.5 MG tablet Take 0.5 mg by mouth 3 (three) times daily as needed.    Yes [provider]  cyclobenzaprine (FLEXERIL) 10 MG tablet Take 10 mg by mouth 3 (three) times daily as needed. 08/16/16  Yes [provider]  furosemide (LASIX) 20 MG tablet Take 1 tablet (20 mg total) by mouth 2 (two) times daily. Patient taking differently: Take 20 mg by mouth daily.  06/12/17 06/12/18 Yes Gouru, Aruna, MD  Mometasone Furoate 200 MCG/ACT AERO Inhale 2 puffs into the lungs.   Yes [provider]  naproxen sodium (ALEVE) 220 MG tablet Take 220 mg by mouth daily as needed.   Yes [provider]  nitroGLYCERIN (NITROSTAT) 0.4 MG SL tablet Place 0.4 mg under the  tongue as needed. 05/01/17 05/01/18 Yes [provider]  senna-docusate (SENOKOT-S) 8.6-50 MG tablet Take 1 tablet by mouth at bedtime as needed for mild constipation. 06/12/17  Yes Gouru, Deanna Artis, MD   No Known Allergies  FAMILY HISTORY:  family history includes Dementia in her mother; Heart attack in her father. SOCIAL HISTORY:  reports that she has been smoking cigarettes.  She has a 30.00 pack-year smoking history. She has never used smokeless tobacco. She reports that she does not drink alcohol or use drugs.  REVIEW OF SYSTEMS:   Unable to obtain due to critical illness   VITAL SIGNS: Temp:  [98.9 F (37.2 C)-101.9 F (38.8 C)] 100.1 F (37.8 C) (04/01 1700) Pulse Rate:  [95-119] 106 (04/01 1700) Resp:  [15-19] 17 (04/01 1500) BP: (105-126)/(69-80) 124/73 (04/01 1700) SpO2:  [83 %-96 %] 93 % (04/01 1700) FiO2 (%):  [40 %] 40 % (04/01 1511) Weight:  [105.9 kg (233 lb 7.5 oz)] 105.9 kg (233 lb 7.5 oz) (04/01 0500)  Physical Examination:  Awake in no distress nonfocal neuro exam moving all extremities On the vent no distress,BEA E and no rales S1+S2 audible no murmur  benign abdomen was febrile for sepsis No leg edema  ASSESSMENT / PLAN:  Acute respiratory failure.Noted to have a very difficult airway and continuesto have negative cough leak test. -Plan per ENT attempt extubation in the OR in amwith possible requirement for tracheostomy.  Prerenal azotemia(improved)with hypochloremia, hypercalcemia,hemoconcentration,and intravascular volume depletion was possible over diuresis and secondary to the negative catabolic  effect of the steroids -Optimize free water,offsteroids, monitor renal panel and urine output.  Diastolic CHF. -Optimize diuresis to improve lung compliance.  Pneumonia(Normal resp flora,sputum). -Unasyn and d/c Zosyn, Start on Vanc (Fever, increased Rt. L airspace disease).Monitor CXR + CBC + FiO2.  Febrile. -Start on Vanc and  monitor cultures, lactic acid  Pericardial effusion with no evidence of pericardial tamponade.Hemodynamically stable -Management as per cardiology  Oral thrush -Mycostatin wash.  Full code  DVT &GI prophylaxis. Continue supportive care.  critical care time 35 min

## 2017-08-13 NOTE — Progress Notes (Signed)
Pharmacy Antibiotic Note  Angel MeekerSonya D [name] is a 52 y.o. female admitted on 07/29/2017 with pneumonia.  Pharmacy has been consulted for Unasyn dosing. Patient was previously on Zosyn.   4/1 1830 Pharmacy consulted for vancomycin dosing   Plan: Ke: 0.077  T1/2: 9.0   Vd: 48.51  DW: 69.3  Start vancomycin 1g IV every 12 hours with 6 hour stack dosing. Calculated trough at Css 15.9. Trough level ordered prior to 4th dose.   Continue Unasyn 3 g iv q 6 hours.   Height: 5' (152.4 cm) Weight: 233 lb 7.5 oz (105.9 kg) IBW/kg (Calculated) : 45.5  Temp (24hrs), Avg:99.9 F (37.7 C), Min:98.9 F (37.2 C), Max:101.9 F (38.8 C)  Recent Labs  Lab 08/08/17 0435 08/09/17 0516 08/10/17 0505 08/11/17 0508 08/13/17 0449  WBC 12.4*  --  13.6* 11.8* 11.8*  CREATININE 0.73 0.89 0.80 0.67 0.80    Estimated Creatinine Clearance: 89.5 mL/min (by C-G formula based on SCr of 0.8 mg/dL).    No Known Allergies  Antimicrobials this admission: Cefepime  3/19 >> 3/26 Zosyn 3/29 >> 4/1 Unasyn 4/1 >> Fluconazole 3/31 >> Vancomycin 4/1>>  Dose adjustments this admission:   Microbiology results: 3/17 UCx: multiple species  3/19 TA: normal flora 3/23 TA: normal flora  3/29 TA: normal flora 3/19 MRSA PCR: negative  Thank you for allowing pharmacy to be a part of this patient's care.  Gardner CandleSheema M Carnell Casamento, PharmD, BCPS Clinical Pharmacist 08/13/2017 7:18 PM

## 2017-08-13 NOTE — Progress Notes (Signed)
Physical Therapy Treatment Patient Details Name: Reinaldo MeekerSonya D Garton MRN: [id number] DOB: 01/20/1966 Today's Date: 08/13/2017    History of Present Illness Pt is a 52 y.o. female presenting to hospital 07/29/17 with chest pain and increasing SOB.  Pt admitted to hospital with acute on chronic respiratory failure with hypoxia secondary acute on chronic diastolic CHF, pericardial effusion, pleural effusion, htn, and L sided PNA.  Transferred to CCU 07/30/17 and intubated 07/31/17 (very difficult airway and difficult to wean).  PT order 08/10/17 for ROM; plan for OR extubation 4/2.  PMH includes COPD (4 L home O2), htn, (+) smoking, anxiety.    PT Comments    Pt performed bed exercises AROM to AAROM; Pt limited by fatigue (suspect due to medication) (see exercises for details). Pt agreeable and motivated for session (Pt communicates by head nodding). PT will focus on progression of strength, mobility, and activity tolerance as appropriate. PT will need to re-evaluate Pt's mobility after planned extubation on 08/14/17; will obtain OT consult post extubation on 08/14/17.   Follow Up Recommendations  CIR     Equipment Recommendations  Rolling walker with 5" wheels;3in1 (PT)    Recommendations for Other Services       Precautions / Restrictions Precautions Precautions: Fall Precaution Comments: Mechanical ventilation; gastric tube; HOB >30 degrees Restrictions Weight Bearing Restrictions: No    Mobility  Bed Mobility               General bed mobility comments: Deferred due to intubation in ICU.  Transfers                 General transfer comment: Deferred due to intubation in ICU.  Ambulation/Gait             General Gait Details: Deferred due to intubation in ICU.   Stairs            Wheelchair Mobility    Modified Rankin (Stroke Patients Only)       Balance                                            Cognition Arousal/Alertness:  Lethargic;Suspect due to medications Behavior During Therapy: [facility] CenterWFL for tasks assessed/performed Overall Cognitive Status: Within Functional Limits for tasks assessed                                 General Comments: Pt currently intubate in ICU. Pt willing and able to communicate by nodding head.      Exercises Total Joint Exercises Ankle Circles/Pumps: AROM;Strengthening;Both;5 reps;Supine(HOB elevated) Short Arc Quad: Both;5 reps;AAROM;Strengthening(HOB elevated) Heel Slides: AAROM;Both;5 reps;Supine;Strengthening(HOB elevated) Hip ABduction/ADduction: AAROM;Strengthening;Both;5 reps;Supine(HOB elevated) General Exercises - Upper Extremity Elbow Flexion: AAROM;Left;5 reps;Strengthening;Supine(HOB elevated) Digit Composite Flexion: AROM;Strengthening;Both;5 reps;Supine(HOB elevated)    General Comments  Pt agreeable to session.      Pertinent Vitals/Pain Pain Assessment: No/denies pain Pain Score: 0-No pain Pain Intervention(s): Limited activity within patient's tolerance;Monitored during session    Home Living                      Prior Function            PT Goals (current goals can now be found in the care plan section) Acute Rehab PT Goals Patient Stated Goal: I want to get stronger PT  Goal Formulation: With patient Time For Goal Achievement: 08/24/17 Potential to Achieve Goals: Fair Additional Goals Additional Goal #1: update functional mobility goals once OOB mobility assessment performed Progress towards PT goals: Progressing toward goals    Frequency    Min 2X/week      PT Plan      Co-evaluation              AM-PAC PT "6 Clicks" Daily Activity  Outcome Measure  Difficulty turning over in bed (including adjusting bedclothes, sheets and blankets)?: Unable Difficulty moving from lying on back to sitting on the side of the bed? : Unable Difficulty sitting down on and standing up from a chair with arms (e.g., wheelchair,  bedside commode, etc,.)?: Unable Help needed moving to and from a bed to chair (including a wheelchair)?: Total Help needed walking in hospital room?: Total Help needed climbing 3-5 steps with a railing? : Total 6 Click Score: 6    End of Session   Activity Tolerance: No increased pain;Patient tolerated treatment well Patient left: in bed;with call bell/phone within reach;with bed alarm set(B heels elevated by pillow) Nurse Communication: Mobility status PT Visit Diagnosis: Muscle weakness (generalized) (M62.81);Other abnormalities of gait and mobility (R26.89);Difficulty in walking, not elsewhere classified (R26.2)     Time: 1610-9604 PT Time Calculation (min) (ACUTE ONLY): 18 min  Charges:                       G Codes:        Divine Hansley Mondrian-Pardue, SPT 08/13/2017, 4:02 PM

## 2017-08-13 NOTE — Progress Notes (Signed)
Pharmacy Antibiotic Note  [name] is a 52 y.o. female admitted on 07/29/2017 with pneumonia.  Pharmacy has been consulted for Unasyn dosing. Patient was previously on Zosyn.   Plan: Unasyn 3 g iv q 6 hours.   Height: 5' (152.4 cm) Weight: 233 lb 7.5 oz (105.9 kg) IBW/kg (Calculated) : 45.5  Temp (24hrs), Avg:99.9 F (37.7 C), Min:98.9 F (37.2 C), Max:101.9 F (38.8 C)  Recent Labs  Lab 08/08/17 0435 08/09/17 0516 08/10/17 0505 08/11/17 0508 08/13/17 0449  WBC 12.4*  --  13.6* 11.8* 11.8*  CREATININE 0.73 0.89 0.80 0.67 0.80    Estimated Creatinine Clearance: 89.5 mL/min (by C-G formula based on SCr of 0.8 mg/dL).    No Known Allergies  Antimicrobials this admission: Cefepime  3/19 >> 3/26 Zosyn 3/29 >> 4/1 Unasyn 4/1 >> Fluconazole 3/31 >>  Dose adjustments this admission:   Microbiology results: 3/17 UCx: multiple species  3/19 TA: normal flora 3/23 TA: normal flora  3/29 TA: normal flora 3/19 MRSA PCR: negative  Thank you for allowing pharmacy to be a part of this patient's care.  Valentina GuChristy, Sabrina Keough D 08/13/2017 4:58 PM

## 2017-08-14 ENCOUNTER — Ambulatory Visit: Admit: 2017-08-14 | Payer: Self-pay | Admitting: Otolaryngology

## 2017-08-14 ENCOUNTER — Encounter
Admission: EM | Disposition: A | Discharge status: Home / Self Care | Payer: Self-pay | Source: Home / Self Care | Attending: Internal Medicine

## 2017-08-14 ENCOUNTER — Inpatient Hospital Stay: Payer: Medicaid Other | Admitting: Anesthesiology

## 2017-08-14 ENCOUNTER — Encounter: Payer: Self-pay | Admitting: Anesthesiology

## 2017-08-14 ENCOUNTER — Inpatient Hospital Stay: Payer: Medicaid Other

## 2017-08-14 HISTORY — PX: EXTUBATION (ENDOTRACHEAL) IN OR: SHX6588

## 2017-08-14 LAB — CBC WITH DIFFERENTIAL/PLATELET
Basophils Absolute: 0 10*3/uL (ref 0–0.1)
Basophils Relative: 0 %
EOS ABS: 0.1 10*3/uL (ref 0–0.7)
Eosinophils Relative: 1 %
HEMATOCRIT: 46.3 % (ref 35.0–47.0)
HEMOGLOBIN: 13.9 g/dL (ref 12.0–16.0)
LYMPHS ABS: 1.6 10*3/uL (ref 1.0–3.6)
LYMPHS PCT: 15 %
MCH: 24.3 pg — AB (ref 26.0–34.0)
MCHC: 30 g/dL — AB (ref 32.0–36.0)
MCV: 81 fL (ref 80.0–100.0)
MONOS PCT: 8 %
Monocytes Absolute: 0.9 10*3/uL (ref 0.2–0.9)
NEUTROS ABS: 8.1 10*3/uL — AB (ref 1.4–6.5)
Neutrophils Relative %: 76 %
Platelets: 261 10*3/uL (ref 150–440)
RBC: 5.72 MIL/uL — ABNORMAL HIGH (ref 3.80–5.20)
RDW: 22.1 % — ABNORMAL HIGH (ref 11.5–14.5)
WBC: 10.7 10*3/uL (ref 3.6–11.0)

## 2017-08-14 LAB — BLOOD GAS, ARTERIAL
Acid-Base Excess: 7.8 mmol/L — ABNORMAL HIGH (ref 0.0–2.0)
Acid-Base Excess: 7.2 mmol/L — ABNORMAL HIGH (ref 0.0–2.0)
Allens test (pass/fail): POSITIVE — AB
BICARBONATE: 35.5 mmol/L — AB (ref 20.0–28.0)
Bicarbonate: 33.6 mmol/L — ABNORMAL HIGH (ref 20.0–28.0)
FIO2: 0.4
MECHANICAL RATE: 15
MECHVT: 450 mL
O2 Saturation: 93.7 %
O2 Saturation: 96.5 %
PATIENT TEMPERATURE: 37
PEEP/CPAP: 5 cmH2O
PEEP: 5 cmH2O
PH ART: 7.38 (ref 7.350–7.450)
Patient temperature: 37
RATE: 14 {breaths}/min
VT: 450 mL
pCO2 arterial: 60 mmHg — ABNORMAL HIGH (ref 32.0–48.0)
pCO2 arterial: 53 mmHg — ABNORMAL HIGH (ref 32.0–48.0)
pH, Arterial: 7.41 (ref 7.350–7.450)
pO2, Arterial: 71 mmHg — ABNORMAL LOW (ref 83.0–108.0)
pO2, Arterial: 85 mmHg (ref 83.0–108.0)

## 2017-08-14 LAB — PROTIME-INR
INR: 1.21
Prothrombin Time: 15.2 seconds (ref 11.4–15.2)

## 2017-08-14 LAB — BASIC METABOLIC PANEL WITH GFR
Anion gap: 9 (ref 5–15)
BUN: 33 mg/dL — ABNORMAL HIGH (ref 6–20)
CO2: 32 mmol/L (ref 22–32)
Calcium: 9.2 mg/dL (ref 8.9–10.3)
Chloride: 106 mmol/L (ref 101–111)
Creatinine, Ser: 0.65 mg/dL (ref 0.44–1.00)
GFR calc Af Amer: >60 mL/min
GFR calc non Af Amer: >60 mL/min
Glucose, Bld: 120 mg/dL — ABNORMAL HIGH (ref 65–99)
Potassium: 3.6 mmol/L (ref 3.5–5.1)
Sodium: 147 mmol/L — ABNORMAL HIGH (ref 135–145)

## 2017-08-14 LAB — GLUCOSE, CAPILLARY
GLUCOSE-CAPILLARY: 111 mg/dL — AB (ref 65–99)
GLUCOSE-CAPILLARY: 138 mg/dL — AB (ref 65–99)
Glucose-Capillary: 109 mg/dL — ABNORMAL HIGH (ref 65–99)
Glucose-Capillary: 137 mg/dL — ABNORMAL HIGH (ref 65–99)
Glucose-Capillary: 132 mg/dL — ABNORMAL HIGH (ref 65–99)
Glucose-Capillary: 123 mg/dL — ABNORMAL HIGH (ref 65–99)

## 2017-08-14 LAB — FIBRINOGEN: FIBRINOGEN: 506 mg/dL — AB (ref 210–475)

## 2017-08-14 LAB — MAGNESIUM: Magnesium: 1.9 mg/dL (ref 1.7–2.4)

## 2017-08-14 LAB — CALCIUM, IONIZED: CALCIUM, IONIZED, SERUM: 5.3 mg/dL (ref 4.5–5.6)

## 2017-08-14 LAB — APTT: aPTT: 37 seconds — ABNORMAL HIGH (ref 24–36)

## 2017-08-14 LAB — PHOSPHORUS: Phosphorus: 3.2 mg/dL (ref 2.5–4.6)

## 2017-08-14 SURGERY — REMOVAL, ENDOTRACHEAL TUBE
Anesthesia: Monitor Anesthesia Care

## 2017-08-14 MED ORDER — ONDANSETRON HCL 4 MG/2ML IJ SOLN: 4.0000 mg | Freq: Once | INTRAMUSCULAR | Status: DC | PRN

## 2017-08-14 MED ORDER — LACTATED RINGERS IV SOLN
INTRAVENOUS | Status: DC | PRN
  Administered 2017-08-14: 08:00:00 via INTRAVENOUS

## 2017-08-14 MED ORDER — GLYCOPYRROLATE 0.2 MG/ML IJ SOLN
INTRAMUSCULAR | Status: AC
  Filled 2017-08-14: qty 1

## 2017-08-14 MED ORDER — ORAL CARE MOUTH RINSE
15.0000 mL | Freq: Two times a day (BID) | OROMUCOSAL | Status: DC
  Administered 2017-08-16 – 2017-08-17 (×2): 15 mL via OROMUCOSAL

## 2017-08-14 MED ORDER — LIDOCAINE-EPINEPHRINE 1 %-1:100000 IJ SOLN
INTRAMUSCULAR | Status: AC
  Filled 2017-08-14: qty 1

## 2017-08-14 MED ORDER — FLUCONAZOLE 50 MG PO TABS
100.0000 mg | ORAL_TABLET | Freq: Every day | ORAL | Status: DC
  Administered 2017-08-15 – 2017-08-17 (×3): 100 mg via ORAL
  Filled 2017-08-14 (×3): qty 2

## 2017-08-14 MED ORDER — PREMIER PROTEIN SHAKE
11.0000 [oz_av] | Freq: Two times a day (BID) | ORAL | Status: DC
  Administered 2017-08-16 – 2017-08-17 (×3): 11 [oz_av] via ORAL

## 2017-08-14 MED ORDER — DOCUSATE SODIUM 50 MG/5ML PO LIQD
100.0000 mg | Freq: Two times a day (BID) | ORAL | Status: DC
  Filled 2017-08-14 (×3): qty 10

## 2017-08-14 MED ORDER — GLYCOPYRROLATE 0.2 MG/ML IJ SOLN
INTRAMUSCULAR | Status: DC | PRN
  Administered 2017-08-14: 0.2 mg via INTRAVENOUS

## 2017-08-14 MED ORDER — SERTRALINE HCL 50 MG PO TABS
50.0000 mg | ORAL_TABLET | Freq: Every day | ORAL | Status: DC
Start: 2017-08-14 — End: 2017-08-17
  Administered 2017-08-16 (×2): 50 mg via ORAL
  Filled 2017-08-14 (×2): qty 1

## 2017-08-14 MED ORDER — PROPOFOL 10 MG/ML IV BOLUS
INTRAVENOUS | Status: AC
  Filled 2017-08-14: qty 20

## 2017-08-14 MED ORDER — SUCCINYLCHOLINE CHLORIDE 20 MG/ML IJ SOLN
INTRAMUSCULAR | Status: AC
  Filled 2017-08-14: qty 1

## 2017-08-14 MED ORDER — LIDOCAINE HCL (PF) 2 % IJ SOLN
INTRAMUSCULAR | Status: AC
  Filled 2017-08-14: qty 10

## 2017-08-14 MED ORDER — FENTANYL CITRATE (PF) 100 MCG/2ML IJ SOLN
INTRAMUSCULAR | Status: AC
  Filled 2017-08-14: qty 2

## 2017-08-14 MED ORDER — DEXAMETHASONE SODIUM PHOSPHATE 10 MG/ML IJ SOLN
INTRAMUSCULAR | Status: AC
  Filled 2017-08-14: qty 1

## 2017-08-14 MED ORDER — FENTANYL CITRATE (PF) 100 MCG/2ML IJ SOLN: 25.0000 ug | INTRAMUSCULAR | Status: DC | PRN

## 2017-08-14 MED ORDER — DEXTROSE 5 % IV SOLN
INTRAVENOUS | Status: DC
  Administered 2017-08-14: 12:00:00 via INTRAVENOUS

## 2017-08-14 MED ORDER — ONDANSETRON HCL 4 MG/2ML IJ SOLN
INTRAMUSCULAR | Status: AC
  Filled 2017-08-14: qty 2

## 2017-08-14 MED ORDER — ACETAMINOPHEN 160 MG/5ML PO SOLN
650.0000 mg | Freq: Four times a day (QID) | ORAL | Status: DC | PRN
  Filled 2017-08-14: qty 20.3

## 2017-08-14 MED ORDER — FAMOTIDINE 20 MG PO TABS
20.0000 mg | ORAL_TABLET | Freq: Two times a day (BID) | ORAL | Status: DC
  Administered 2017-08-14 – 2017-08-17 (×6): 20 mg via ORAL
  Filled 2017-08-14 (×5): qty 1

## 2017-08-14 SURGICAL SUPPLY — 26 items
BLADE SURG 15 STRL LF DISP TIS (BLADE) IMPLANT
BLADE SURG 15 STRL SS (BLADE)
BLADE SURG SZ11 CARB STEEL (BLADE) IMPLANT
CANISTER SUCT 1200ML W/VALVE (MISCELLANEOUS) IMPLANT
ELECT REM PT RETURN 9FT ADLT (ELECTROSURGICAL)
ELECTRODE REM PT RTRN 9FT ADLT (ELECTROSURGICAL) IMPLANT
GAUZE PACKING IODOFORM 1/2 (PACKING) IMPLANT
GLOVE BIO SURGEON STRL SZ7.5 (GLOVE) IMPLANT
GOWN STRL REUS W/ TWL LRG LVL3 (GOWN DISPOSABLE) IMPLANT
GOWN STRL REUS W/TWL LRG LVL3 (GOWN DISPOSABLE)
HEMOSTAT SURGICEL 2X3 (HEMOSTASIS) IMPLANT
KIT TURNOVER KIT A (KITS) ×3 IMPLANT
LABEL OR SOLS (LABEL) IMPLANT
NS IRRIG 500ML POUR BTL (IV SOLUTION) IMPLANT
PACK HEAD/NECK (MISCELLANEOUS) IMPLANT
SHEARS HARMONIC 9CM CVD (BLADE) IMPLANT
SPONGE KITTNER 5P (MISCELLANEOUS) IMPLANT
SPONGE VERSALON 4X4 4PLY (MISCELLANEOUS) IMPLANT
SUCTION FRAZIER HANDLE 10FR (MISCELLANEOUS)
SUCTION TUBE FRAZIER 10FR DISP (MISCELLANEOUS) IMPLANT
SUT SILK 2 0 (SUTURE)
SUT SILK 2 0 SH (SUTURE) IMPLANT
SUT SILK 2-0 30XBRD TIE 12 (SUTURE) IMPLANT
SUT VIC AB 3-0 PS2 18 (SUTURE) IMPLANT
TUBE TRACH SHILEY  6 DIST  CUF (TUBING) IMPLANT
TUBE TRACH SHILEY 8 DIST CUF (TUBING) IMPLANT

## 2017-08-14 NOTE — Evaluation (Signed)
Clinical/Bedside Swallow Evaluation Patient Details  Name: [name] MRN: [id number] Date of Birth: Dec 03, 1965  Today's Date: 08/14/2017 Time: SLP Start Time (ACUTE ONLY): 1500 SLP Stop Time (ACUTE ONLY): 1600 SLP Time Calculation (min) (ACUTE ONLY): 60 min  Past Medical History:  Past Medical History:  Diagnosis Date  . Anxiety   . COPD (chronic obstructive pulmonary disease) (HCC)   . Essential hypertension   . History of echocardiogram    a. 08/2013 Echo: EF 55-60%, impaired LV relaxation, mild LVH, nl RV fxn, nl RVSP, mild TR; b. 05/2017 Echo: EF 65-70%, no rwma, nl RV fxn.  . Morbid obesity (HCC)   . Tobacco abuse    Past Surgical History:  Past Surgical History:  Procedure Laterality Date  . ABDOMINAL HYSTERECTOMY    . CHOLECYSTECTOMY    . EXTUBATION (ENDOTRACHEAL) IN OR N/A 08/14/2017   Procedure: EXTUBATION (ENDOTRACHEAL) IN OR;  Surgeon: Geanie Logan, MD;  Location: [facility] ORS;  Service: ENT;  Laterality: N/A;  . HERNIA REPAIR     HPI:  Pt is a 52 y.o. female with a known history of COPD, essential hypertension, Obesity, COPD, chronic home oxygen use 4 L/m, active smoking, and other medical issues who started having chest pain which is central, nonradiating, dull and soreness type, associated with worsening shortness of breath so came to emergency room. This was started today morning and progressively felt getting worse with each breathing. Her CT scan chest for pulmonary embolism was negative, she had the bilateral small pleural effusion with some atelectasis and some pericardial effusion. She was also requiring oxygen via Ventimask, so given to hospitalist team for further management. After admission, pt was placed on high flow nasal cannula so that she would be able to eat/drink better. 60%35 liters. 89 to 92% patient tolerated well per RT. Required transfer to stepdown unit on 03/18 with hypotension secondary to antihypertensive and worsening acute on chronic hypoxic  respiratory failure secondary to pulmonary edema and complete opacification of left hemithorax secondary to mucous plugging vs. large left pleural effusion. Pt was then orally intubated on 07/31/17 d/t respiratory failure. Pt was extubated early this morning on 08/14/17; BSE held until the late afternoon to determine respiratory tolerance post extubation. Pt is currently on [place] O2 support at 4 liters; Dysphonia sec. to intubation. Pt is A/O x3 and following instructions appropriately.    Assessment / Plan / Recommendation Clinical Impression  Pt appeared to present w/ adequate oropharyngeal phase swallow functioning w/ reduced risk for aspiration from an oropharyngeal phase standpoint when following general aspiration precautions. Pt does present w/ min-mod overall weakness and fatigue d/t lengthy hospitalization and oral intubation. She currently exhibits Dysphonia d/t trauma of lengthy oral intubation to the vocal cords - recommended vocal rest and water intake then f/u w/ ENT if not improved in ~2 weeks. Pt consumed po's during this eval to include mech soft foods, purees and thin liquids (multiple ozs via Cup only to better control amount) w/ no overt s/s of aspiration noted; no decline in respiratory status of O2 sats (95%) or RR; vocal quality remained same. Pt had been educated on general aspiration precautions and the need to take small, single sips/bites. Oral phase appeared grossly wfl w/ trials that were in small bolus amounts, moistened/soft. Timely A-P transfer and oral clearing was noted but min extra time was needed w/ the trials of mech soft d/t the increased effort. Strongly recommended moistened foods; min extra time in order to take her time to  avoid WOB/SOB. Recommend a Mech Soft diet (level 3)w/ Thin liquids; general aspiration precautions; Pills given in a Puree (WHOLE) for easier swallowing and clearing. Educated pt on general aspiration precautions, posted in room. No further skilled ST  services indicated at this time as pt appears at her baseline re: swallowing, and pt was educated on strategies re: swallowing and the option for swallowing Pills in a Puree (Whole) as such can be more comfortable and safe if needed. Discussed foods options and preparation to choose easier to chew/swallow foods currently. NSG updated. Recommend Dietician f/u if needed. SLP Visit Diagnosis: Dysphagia, unspecified (R13.10)    Aspiration Risk  (reduced from an oropharyngeal phase standpoint)    Diet Recommendation  Dysphagia level 3 (mech soft) w/ Thin liquids; moistened, soft foods to lessen exertion and WOB/SOB; rest breaks during meals as needed for same. General aspiration precautions. Reflux precautions per pt's report of REFLUX baseline.   Medication Administration: Whole meds with puree(for easier, safer swallowing currently)    Other  Recommendations Recommended Consults: Consider ENT evaluation(if Dysphonia does not improve ) Oral Care Recommendations: Oral care BID;Patient independent with oral care;Staff/trained caregiver to provide oral care Other Recommendations: (n/a)   Follow up Recommendations None      Frequency and Duration (n/a)  (n/a)       Prognosis Prognosis for Safe Diet Advancement: Good Barriers to Reach Goals: (deconditioning)      Swallow Study   General Date of Onset: 07/29/17 HPI: Pt is a 52 y.o. female with a known history of COPD, essential hypertension, COPD, chronic home oxygen use 4 L/m, active smoking- started having chest pain which is central,nonradiating, dull and soreness type, associated with worsening shortness of breath so came to emergency room. This was started today morning and progressively felt getting worse with each breathing. Her CT scan chest for pulmonary embolism was negative, she had the bilateral small pleural effusion with some atelectasis and some pericardial effusion. She was also requiring oxygen via Ventimask, so given to  hospitalist team for further management. After admission, pt was placed on high flow nasal cannula so that she would be able to eat/drink better. 60%35 liters. 89 to 92% patient tolerated well per RT. Required transfer to stepdown unit on 03/18 with hypotension secondary to antihypertensive and worsening acute on chronic hypoxic respiratory failure secondary to pulmonary edema and complete opacification of left hemithorax secondary to mucous plugging vs. large left pleural effusion. Pt was then orally intubated on 07/31/17 d/t respiratory failure. Pt was extubated early this morning on 08/14/17; BSE held until the late afternoon to determine respiratory tolerance post extubation. Pt is currently on [place] O2 support at 4 liters; Dysphonia sec. to intubation. Pt is A/O x3 and following instructions appropriately.  Type of Study: Bedside Swallow Evaluation Previous Swallow Assessment: none - pt denied any swallowing deficits prior to admission Diet Prior to this Study: NPO(at a regular diet at home) Temperature Spikes Noted: No(wbc 10.7) Respiratory Status: Nasal cannula(4 liters) History of Recent Intubation: Yes Length of Intubations (days): 14 days Date extubated: 07/15/18 Behavior/Cognition: Alert;Cooperative;Pleasant mood(Dysphonia) Oral Cavity Assessment: Within Functional Limits Oral Care Completed by SLP: Recent completion by staff Oral Cavity - Dentition: Missing dentition(mostly molars - does not wear a partial) Vision: Functional for self-feeding Self-Feeding Abilities: Able to feed self;Needs set up Patient Positioning: Upright in bed Baseline Vocal Quality: Breathy;Hoarse(Dysphonia) Volitional Cough: (Tight) Volitional Swallow: Able to elicit    Oral/Motor/Sensory Function Overall Oral Motor/Sensory Function: Within functional limits  Ice Chips Ice chips: Within functional limits Presentation: Spoon(fed; 3 trials)   Thin Liquid Thin Liquid: Within functional limits Presentation: Cup;Self  Fed(~4+ ozs) Other Comments: pt followed all instructions to use small, single sips    Nectar Thick Nectar Thick Liquid: Not tested   Honey Thick Honey Thick Liquid: Not tested   Puree Puree: Within functional limits Presentation: Self Fed;Spoon(4 ozs)   Solid   GO   Solid: Impaired(mech soft trials) Presentation: Self Fed(4 trials) Oral Phase Impairments: Impaired mastication(lacking molars; recently declined respiratory support) Oral Phase Functional Implications: Impaired mastication(lacking dentition/molars) Pharyngeal Phase Impairments: (none) Other Comments: moistened foods; min extra time in order to take her time to avoid WOB/SOB        Jerilynn SomKatherine Watson, MS, CCC-SLP Watson,Katherine 08/14/2017,5:03 PM

## 2017-08-14 NOTE — Progress Notes (Signed)
[place] [facility]                                                                                                                                                                                  Patient Demographics   [name], is a 52 y.o. female, DOB - 03/23/1966, AUQ:[id number]  Admit date - 07/29/2017   Admitting Physician Vaughan Basta, MD  Outpatient Primary MD for the patient is [name], [name], MD   LOS - 16  Subjective: Patient is seen at bedside, patient extubated by ENT in the OR today.  Resting comfortably.  Patient's daughter at bedside.  Review of Systems:   Could not be obtained as patient is on ventilator but patient alert, able to communicate with family with lipmovement  Vitals:   Vitals:   08/14/17 1100 08/14/17 1133 08/14/17 1200 08/14/17 1300  BP: 125/77  121/79 126/82  Pulse: 91  83 81  Resp: 17  19 (!) 21  Temp:   98.9 F (37.2 C)   TempSrc:   Axillary   SpO2: 92% 94% 95% 92%  Weight:      Height:        Wt Readings from Last 3 Encounters:  08/14/17 107.6 kg (237 lb 3.4 oz)  06/12/17 119 kg (262 lb 6.4 oz)  09/29/16 117.9 kg (260 lb)     Intake/Output Summary (Last 24 hours) at 08/14/2017 1331 Last data filed at 08/14/2017 1300 Gross per 24 hour  Intake 3303.67 ml  Output 1695 ml  Net 1608.67 ml    Physical Exam:   GENERAL: 52 year old female in no distress. FiO2 40% PEEP 5pressure support 10 HEAD, EYES, EARS, NOSE AND THROAT: Atraumatic, normocephalic. Extraocular muscles are intact. Pupils equal and reactive to light. Sclerae anicteric. No conjunctival injection.  NECK: Supple. There is no jugular venous distention. No bruits, no lymphadenopathy, no thyromegaly.  HEART: Regular rate and rhythm,. No murmurs, no rubs, no clicks.  LUNGS: Clear to auscultation, no wheezing.  ABDOMEN: Soft, flat, nontender, nondistended. Has good bowel sounds. No hepatosplenomegaly appreciated.  EXTREMITIES: No  evidence of any cyanosis, clubbing, or peripheral edema.  +2 pedal and radial pulses bilaterally.  NEUROLOGIC: The patient is alert, awake  On ventilator SKIN: Moist and warm with no rashes appreciated.  Psych: could not be assessed LN: No inguinal LN enlargement    Antibiotics   Anti-infectives (From admission, onward)   Start     Dose/Rate Route Frequency Ordered Stop   08/13/17 2300  vancomycin (VANCOCIN) IVPB 1000 mg/200 mL premix     1,000 mg 200 mL/hr over 60 Minutes Intravenous Every 12 hours 08/13/17 1920  08/13/17 1845  vancomycin (VANCOCIN) IVPB 1000 mg/200 mL premix     1,000 mg 200 mL/hr over 60 Minutes Intravenous  Once 08/13/17 1831 08/13/17 1950   08/13/17 1400  Ampicillin-Sulbactam (UNASYN) 3 g in sodium chloride 0.9 % 100 mL IVPB     3 g 200 mL/hr over 30 Minutes Intravenous Every 6 hours 08/13/17 1253     08/13/17 1000  fluconazole (DIFLUCAN) tablet 100 mg     100 mg Per Tube Daily 08/12/17 1134     08/12/17 1145  fluconazole (DIFLUCAN) IVPB 200 mg     200 mg 100 mL/hr over 60 Minutes Intravenous  Once 08/12/17 1134 08/12/17 1342   08/10/17 1600  piperacillin-tazobactam (ZOSYN) IVPB 3.375 g  Status:  Discontinued     3.375 g 12.5 mL/hr over 240 Minutes Intravenous Every 8 hours 08/10/17 1550 08/13/17 1050   07/31/17 0800  ceFEPIme (MAXIPIME) 2 g in sodium chloride 0.9 % 100 mL IVPB     2 g 200 mL/hr over 30 Minutes Intravenous Every 8 hours 07/31/17 0627 08/07/17 0030   07/29/17 1915  ceFEPIme (MAXIPIME) 2 g in sodium chloride 0.9 % 100 mL IVPB     2 g 200 mL/hr over 30 Minutes Intravenous  Once 07/29/17 1911 07/29/17 2050   07/29/17 1915  vancomycin (VANCOCIN) IVPB 1000 mg/200 mL premix     1,000 mg 200 mL/hr over 60 Minutes Intravenous  Once 07/29/17 1911 07/29/17 2342      Medications   Scheduled Meds: . budesonide (PULMICORT) nebulizer solution  0.5 mg Nebulization BID  . chlorhexidine gluconate (MEDLINE KIT)  15 mL Mouth Rinse BID  . docusate   100 mg Per Tube BID  . famotidine  20 mg Per Tube BID  . feeding supplement (PRO-STAT SUGAR FREE 64)  30 mL Per Tube BID  . feeding supplement (VITAL HIGH PROTEIN)  1,000 mL Per Tube Q24H  . fluconazole  100 mg Per Tube Daily  . free water  200 mL Per Tube Q4H  . insulin aspart  0-15 Units Subcutaneous Q4H  . ipratropium-albuterol  3 mL Nebulization Q4H  . mouth rinse  15 mL Mouth Rinse 10 times per day  . metoCLOPramide (REGLAN) injection  5 mg Intravenous Q12H  . multivitamin  15 mL Per Tube Daily  . neomycin-bacitracin-polymyxin   Topical Daily  . senna-docusate  1 tablet Oral BID  . sertraline  50 mg Per Tube QHS  . sodium chloride flush  10-40 mL Intracatheter Q12H   Continuous Infusions: . ampicillin-sulbactam (UNASYN) IV Stopped (08/14/17 0930)  . dextrose 50 mL/hr at 08/14/17 1207  . fentaNYL infusion INTRAVENOUS Stopped (08/13/17 2358)  . vancomycin Stopped (08/14/17 1134)   PRN Meds:.acetaminophen (TYLENOL) oral liquid 160 mg/5 mL, albuterol, bisacodyl, fentaNYL, LORazepam, ondansetron (ZOFRAN) IV, sodium chloride flush   Data Review:   Micro Results Recent Results (from the past 240 hour(s))  Culture, respiratory (NON-Expectorated)     Status: None   Collection Time: 08/10/17  2:32 PM  Result Value Ref Range Status   Specimen Description TRACHEAL ASPIRATE  Final   Special Requests   Final    NONE Performed at [facility], [address] [place], [place] [address]    Gram Stain   Final    ABUNDANT WBC PRESENT, PREDOMINANTLY PMN RARE SQUAMOUS EPITHELIAL CELLS PRESENT MODERATE GRAM POSITIVE COCCI IN CHAINS    Culture   Final    FEW Consistent with normal respiratory flora. Performed at [facility], [address], [place], [place] [address]    Report Status 08/12/2017 FINAL  Final  Culture, respiratory (NON-Expectorated)     Status: None (Preliminary result)   Collection Time: 08/13/17  6:30 PM  Result Value Ref Range Status   Specimen  Description   Final    TRACHEAL ASPIRATE Performed at [facility], [address] [place], [place] [address]    Special Requests   Final    NONE Performed at [facility], [address] [place], [place] [address]    Gram Stain   Final    RARE WBC PRESENT, PREDOMINANTLY PMN NO ORGANISMS SEEN Performed at [facility], [address], [place], [place] [address]    Culture PENDING  Incomplete   Report Status PENDING  Incomplete  CULTURE, BLOOD (ROUTINE X 2) w Reflex to ID Panel     Status: None (Preliminary result)   Collection Time: 08/13/17  7:06 PM  Result Value Ref Range Status   Specimen Description BLOOD BLOOD RIGHT HAND  Final   Special Requests   Final    BOTTLES DRAWN AEROBIC AND ANAEROBIC Blood Culture results may not be optimal due to an inadequate volume of blood received in culture bottles   Culture   Final    NO GROWTH < 12 HOURS Performed at [facility], [address], [place], [place] [address]    Report Status PENDING  Incomplete  CULTURE, BLOOD (ROUTINE X 2) w Reflex to ID Panel     Status: None (Preliminary result)   Collection Time: 08/13/17  7:13 PM  Result Value Ref Range Status   Specimen Description BLOOD BLOOD LEFT HAND  Final   Special Requests   Final    BOTTLES DRAWN AEROBIC AND ANAEROBIC Blood Culture adequate volume   Culture   Final    NO GROWTH < 12 HOURS Performed at [facility], [address], [place], [place] [address]    Report Status PENDING  Incomplete    Radiology Reports Dg Chest 2 View  Result Date: 07/30/2017 CLINICAL DATA:  Shortness of breath. Acute on chronic respiratory failure. Hypoxia. Morbid obesity. EXAM: CHEST - 2 VIEW COMPARISON:  Chest x-ray and CT chest 07/29/2017. FINDINGS: Cardiac silhouette is partially silhouetted by large pleural effusion on the LEFT. There is a whiteout of the LEFT hemithorax, which could represent mucous plugging and or increasing effusion. The  RIGHT lung demonstrates interstitial edema with small effusion. IMPRESSION: Marked worsening aeration. Complete opacity of the LEFT hemithorax represents a significant change from yesterday's radiograph. Electronically Signed   By: Staci Righter M.D.   On: 07/30/2017 08:45   Dg Chest 2 View  Result Date: 07/29/2017 CLINICAL DATA:  52 year old female with history of chest pain since last week. Pain most severe during deep breathing. EXAM: CHEST - 2 VIEW COMPARISON:  Chest x-ray 06/10/2017. FINDINGS: Left lower lobe airspace consolidation concerning for pneumonia. Small bilateral pleural effusions. Mild diffuse peribronchial cuffing. Mild cardiomegaly. Upper mediastinal contours appear widened, but are stable compared to prior examinations. IMPRESSION: 1. Left lower lobe pneumonia with small bilateral pleural effusions. Followup PA and lateral chest X-ray is recommended in 3-4 weeks following trial of antibiotic therapy to ensure resolution and exclude underlying malignancy. 2. Mild cardiomegaly. Electronically Signed   By: Vinnie Langton M.D.   On: 07/29/2017 17:37   Dg Abd 1 View  Result Date: 08/12/2017 CLINICAL DATA:  Dyspepsia.  Difficulty feeding. EXAM: ABDOMEN - 1 VIEW COMPARISON:  08/07/2017 FINDINGS: The enteric tube tip projects over the expected location of the distal stomach. Side port is well below GE junction. Cholecystectomy clips noted. No abnormal bowel dilatation identified. IMPRESSION: 1. NG tube tip within the body of stomach. 2. No abnormal bowel dilatation noted. Electronically Signed   By: Kerby Moors M.D.   On: 08/12/2017 10:40   Dg Abd 1 View  Result Date: 08/07/2017 CLINICAL DATA:  Fecal impaction. EXAM: ABDOMEN - 1 VIEW COMPARISON:  07/31/2017 FINDINGS: Nasogastric tube has its tip in the midportion of the stomach. Clips in the right upper quadrant consistent with previous cholecystectomy. Single clip in the right lower quadrant, possibly appendectomy. Moderate amount of  fecal matter within the right colon. No sign of obstruction. IMPRESSION: Nasogastric tube in the mid stomach. Moderate amount of fecal matter in the right colon. No sign of obstruction. Electronically Signed   By: Nelson Chimes M.D.   On: 08/07/2017 11:32   Ct Chest Wo Contrast  Result Date: 07/31/2017 CLINICAL DATA:  She [facility] ER on 03/17 with c/o intermittent chest pain worse during inspiration and nonproductive cough onset 1 week prior to presentation. On 03/18 pt developed hypotension and worsening acute hypoxic respiratory failure. EXAM: CT CHEST WITHOUT CONTRAST TECHNIQUE: Multidetector CT imaging of the chest was performed following the standard protocol without IV contrast. COMPARISON:  07/29/2017, 06/10/2017 FINDINGS: Cardiovascular: Stable cardiomegaly. Small pericardial effusion. Dilatation of main pulmonary artery as well as the right and left main pulmonary arteries as can be seen with pulmonary arterial hypertension. Thoracic aorta is normal in caliber. Mediastinum/Nodes: Enlarged prevascular lymph node measuring 17 mm in short axis. Multiple other smaller mediastinal lymph nodes are noted. Thyroid gland, trachea, and esophagus demonstrate no significant findings. Lungs/Pleura: Trace right pleural effusion. Small left pleural effusion. Left lower lobe airspace disease with air bronchograms concerning for pneumonia. Improved aeration of left upper lobe. Mild persistent right upper lobe airspace disease likely reflecting atelectasis. Stable 6 mm right middle lobe pulmonary nodule unchanged compared with 09/01/2013. Upper Abdomen: Aortic atherosclerosis. Musculoskeletal: No acute osseous abnormality. No aggressive osseous lesion. IMPRESSION: 1. Left lower lobe airspace disease with air bronchograms concerning for combination of pneumonia and atelectasis. Mild mediastinal lymphadenopathy likely reactive. 2. Small left pleural effusion.  Mild right upper lobe atelectasis. 3.  Aortic  Atherosclerosis (ICD10-I70.0). Electronically Signed   By: Kathreen Devoid   On: 07/31/2017 12:47   Ct Angio Chest Pe W And/or Wo Contrast  Result Date: 07/29/2017 CLINICAL DATA:  52 year old female with history of chest pain since last week. Difficulty taking deep breaths. EXAM: CT ANGIOGRAPHY CHEST WITH CONTRAST TECHNIQUE: Multidetector CT imaging of the chest was performed using the standard protocol during bolus administration of intravenous contrast. Multiplanar CT image reconstructions and MIPs were obtained to evaluate the vascular anatomy. CONTRAST:  33m ISOVUE-370 IOPAMIDOL (ISOVUE-370) INJECTION 76% COMPARISON:  Chest CT 06/10/2017. FINDINGS: Cardiovascular: No filling defects within the pulmonary arterial tree to suggest underlying pulmonary embolism. Heart size is normal. Moderate volume of pericardial fluid with pericardial enhancement concerning for pericarditis. No pericardial calcification. There is aortic atherosclerosis, as well as atherosclerosis of the great vessels of the mediastinum and the coronary arteries, including calcified atherosclerotic plaque in the left anterior descending coronary artery. Mediastinum/Nodes: Multiple prominent borderline enlarged and enlarged mediastinal lymph nodes, largest of which is in the prevascular nodal station measuring up to 22 mm in short axis, increased compared to the prior study. Esophagus is unremarkable in appearance. No axillary lymphadenopathy. Lungs/Pleura: Moderate  left pleural effusion with near complete passive atelectasis of the left lower lobe. Some dependent subsegmental atelectasis is also noted in the left upper lobe. Trace right pleural effusion. Throughout the remaining portions of the lungs there is a background of ground-glass attenuation and mild interlobular septal thickening, indicative of a background of mild interstitial pulmonary edema. 5 mm right middle lobe pulmonary nodule (axial image 47 of series 7), stable dating back to at  least 09/01/2013, considered definitively benign. Upper Abdomen: Aortic atherosclerosis. Musculoskeletal: There are no aggressive appearing lytic or blastic lesions noted in the visualized portions of the skeleton. Review of the MIP images confirms the above findings. IMPRESSION: 1. No evidence of pulmonary embolism. 2. Extensive pericardial enhancement with moderate volume of pericardial fluid, indicative of an acute pericarditis. 3. This is associated with increasing mediastinal lymphadenopathy compared to the prior study. 4. In addition, there is evidence of mild pulmonary edema and bilateral pleural effusions (left greater than right) with extensive areas of passive atelectasis in the left lung. 5. Aortic atherosclerosis, in addition to left anterior descending coronary artery disease. Please note that although the presence of coronary artery calcium documents the presence of coronary artery disease, the severity of this disease and any potential stenosis cannot be assessed on this non-gated CT examination. Assessment for potential risk factor modification, dietary therapy or pharmacologic therapy may be warranted, if clinically indicated. Aortic Atherosclerosis (ICD10-I70.0). Electronically Signed   By: Vinnie Langton M.D.   On: 07/29/2017 19:45   Dg Chest Port 1 View  Result Date: 08/14/2017 CLINICAL DATA:  Pneumonia EXAM: PORTABLE CHEST 1 VIEW COMPARISON:  08/13/2017, 08/11/2017, 08/10/2017 FINDINGS: Endotracheal tube tip is about 2.6 cm superior to the carina. Left-sided central venous catheter tip overlies the SVC. Esophageal tube tip is below the diaphragm but non included. Cardiomegaly with vascular congestion and mild diffuse pulmonary edema. Superimposed airspace disease at the bases, no significant change. Negative for pneumothorax. IMPRESSION: 1. Cardiomegaly with vascular congestion and mild edema, no significant change 2. Superimposed airspace disease at the bilateral lung bases, atelectasis  versus pneumonia, also without significant interval change compared to most recent prior. Electronically Signed   By: Donavan Foil M.D.   On: 08/14/2017 03:04   Dg Chest Port 1 View  Result Date: 08/13/2017 CLINICAL DATA:  Pneumonia EXAM: PORTABLE CHEST 1 VIEW COMPARISON:  08/11/2017 FINDINGS: Endotracheal tube in good position. Central venous catheter tip SVC. NG tube in the stomach. Bilateral airspace disease unchanged, most prominent in the lung bases. Small effusions bilaterally. IMPRESSION: Endotracheal tube and central line unchanged in position Diffuse bilateral airspace disease basilar predominant unchanged. CHF versus pneumonia. Electronically Signed   By: Franchot Gallo M.D.   On: 08/13/2017 07:30   Dg Chest Port 1 View  Result Date: 08/11/2017 CLINICAL DATA:  Pneumonia EXAM: PORTABLE CHEST 1 VIEW COMPARISON:  Yesterday FINDINGS: Endotracheal tube tip at the clavicular heads. Left subclavian central line with tip at the distal SVC. An orogastric tube reaches the stomach. Indistinct opacities at the lung bases. Generalized interstitial coarsening that is stable. Unchanged cardiopericardial enlargement. IMPRESSION: 1. Stable positioning of tubes and central line. 2. History of pneumonia with unchanged lung opacity. 3. Cardiopericardial enlargement. Pericardial effusion by recent chest CT. Electronically Signed   By: Monte Fantasia M.D.   On: 08/11/2017 07:45   Dg Chest Port 1 View  Result Date: 08/10/2017 CLINICAL DATA:  Rhonchi, acute on chronic respiratory failure with hypoxia, heart failure, pericardial effusion, pneumonia versus pericarditis, COPD EXAM: PORTABLE CHEST  1 VIEW COMPARISON:  Portable exam 1445 hours compared to 08/08/2017 FINDINGS: Tip of endotracheal tube projects 3.0 cm above carina. Nasogastric tube extends into stomach. LEFT subclavian central venous catheter tip projects over SVC. Enlargement of cardiac silhouette with vascular congestion. Increased RIGHT lung infiltrate.  Questionable LEFT lower lobe atelectasis versus consolidation. No gross pleural effusion or pneumothorax. IMPRESSION: Increased interstitial infiltrates in RIGHT lung with questionable atelectasis versus infiltrate in LEFT lower lobe. Enlargement of cardiac silhouette with pulmonary vascular congestion. Sign report Electronically Signed   By: Lavonia Dana M.D.   On: 08/10/2017 15:08   Dg Chest Port 1 View  Result Date: 08/08/2017 CLINICAL DATA:  Acute respiratory distress. History of acute on chronic respiratory failure, pericardial effusion, COPD, current smoker. EXAM: PORTABLE CHEST 1 VIEW COMPARISON:  Portable chest x-ray of August 07, 2017 FINDINGS: The lungs are well-expanded. Persistent bibasilar interstitial and airspace opacities are present. The cardiac silhouette is enlarged. The pulmonary vascularity is mildly congested. The endotracheal tube tip lies 3.3 cm above the carina. The esophagogastric tube tip projects below the inferior margin of the image. The left internal jugular venous catheter tip projects over the junction of the proximal and midportions of the SVC. IMPRESSION: Bibasilar atelectasis or pneumonia. Mild CHF. The support tubes are in reasonable position. Electronically Signed   By: David  Martinique M.D.   On: 08/08/2017 09:39   Dg Chest Port 1 View  Result Date: 08/07/2017 CLINICAL DATA:  Hypoxia EXAM: PORTABLE CHEST 1 VIEW COMPARISON:  August 05, 2017 FINDINGS: Endotracheal tube tip is 4.0 cm above the carina. Central catheter tip is in superior vena cava. Nasogastric tube tip and side port are below the diaphragm. No pneumothorax. There are small pleural effusions bilaterally. There is consolidation in the left lower lobe as well as to a lesser extent in the right base. Heart is enlarged with pulmonary venous hypertension. No adenopathy. No bone lesions. IMPRESSION: Tube and catheter positions as described without pneumothorax. Bibasilar consolidation, more on the left than on the  right, likely multifocal pneumonia. A degree of alveolar edema in the bases is possible. There is underlying pulmonary vascular congestion with small pleural effusions bilaterally. Electronically Signed   By: Lowella Grip III M.D.   On: 08/07/2017 07:36   Dg Chest Port 1 View  Result Date: 08/05/2017 CLINICAL DATA:  Acute respiratory failure, COPD, hypertension, smoker EXAM: PORTABLE CHEST 1 VIEW COMPARISON:  Portable exam 0548 hours compared to 08/04/2017 FINDINGS: Tip of endotracheal tube projects 4.1 cm above carina. Nasogastric tube extends into stomach. LEFT subclavian central venous catheter tip projects over SVC. Numerous EKG leads project over chest. Enlargement of cardiac silhouette with pulmonary vascular congestion. Scattered interstitial infiltrates likely representing pulmonary edema. Decreased LEFT pleural effusion. LEFT lower lobe opacification could represent atelectasis or coexistent consolidation. No pneumothorax. IMPRESSION: Persistent pulmonary edema with decreased LEFT pleural effusion. LEFT lower lobe atelectasis versus consolidation. Electronically Signed   By: Lavonia Dana M.D.   On: 08/05/2017 08:11   Dg Chest Port 1 View  Result Date: 08/04/2017 CLINICAL DATA:  Respiratory failure. EXAM: PORTABLE CHEST 1 VIEW COMPARISON:  Yesterday. FINDINGS: Endotracheal tube in satisfactory position. Nasogastric tube extending the stomach. Stable left subclavian catheter. Increased patchy opacity in the perihilar regions bilaterally. No significant change in patchy density interstitial prominence elsewhere in the right lung. Increased left pleural fluid. Stable enlarged cardiac silhouette. No acute bony abnormality. IMPRESSION: 1. Mild worsening of changes of pulmonary edema. 2. Increased left pleural fluid. 3. Stable cardiomegaly.  Electronically Signed   By: Claudie Revering M.D.   On: 08/04/2017 07:05   Dg Chest Port 1 View  Result Date: 08/03/2017 CLINICAL DATA:  Acute respiratory failure.  EXAM: PORTABLE CHEST 1 VIEW COMPARISON:  Radiograph yesterday at 0456 hour.  Chest CT 07/31/2016 FINDINGS: Endotracheal tube tip at the thoracic inlet. Enteric tube in place, tip below the diaphragm. Left central line with tip in the proximal SVC. Unchanged cardiomegaly. Unchanged vascular congestion/pulmonary edema. Bibasilar opacities, left greater than right, unchanged. No pneumothorax. IMPRESSION: Unchanged appearance of the chest with cardiomegaly, pulmonary edema/vascular congestion, and bibasilar opacities, left greater than right. Electronically Signed   By: Jeb Levering M.D.   On: 08/03/2017 01:48   Dg Chest Port 1 View  Result Date: 08/02/2017 CLINICAL DATA:  52 year old female with respiratory failure. Left lung pneumonia. EXAM: PORTABLE CHEST 1 VIEW COMPARISON:  08/01/2017, chest CT 07/31/2017, and earlier. FINDINGS: Portable AP semi upright view at 0456 hours. Endotracheal tube tip at the level the clavicles. Enteric tube courses to the abdomen, tip not included. Stable left IJ or subclavian central line. Substantially improved left lung ventilation since 07/31/2017, although residual moderate confluent left lung base opacity remains. Interval increasing opacity at the right lung base. No pneumothorax. Chronic but increased underlying bilateral pulmonary interstitial opacity compared to 2018, possibly mild vascular congestion. Small bilateral pleural effusions are suspected. Stable cardiac size and mediastinal contours. Cardiomegaly and pericardial effusion noted on chest CT and CTA this month. Paucity of bowel gas in the upper abdomen. IMPRESSION: 1.  Stable lines and tubes. 2. Improved left lung multilobar pneumonia since 07/31/2017, but moderate residual at the lung base. 3. Worsening ventilation at the right lung base over that same time suspicious for right lung involvement now. 4. Stable cardiomegaly.  Mild vascular congestion/pulmonary edema. 5. Small bilateral pleural effusions  suspected. Electronically Signed   By: Genevie Ann M.D.   On: 08/02/2017 11:00   Dg Chest Port 1 View  Result Date: 08/01/2017 CLINICAL DATA:  Respiratory failure EXAM: PORTABLE CHEST 1 VIEW COMPARISON:  Chest radiograph and chest CT July 31, 2017 FINDINGS: Endotracheal tube tip is 3.7 cm above the carina. Central catheter tip is in the superior vena cava. No pneumothorax. There has been significant partial clearing on the left compared to 1 day prior. There has been the significant clearing of diffuse consolidation and effusion. There remains atelectatic change in the left base as well as small left effusion. There has been a clearing of consolidation from the right upper lobe. Currently the right lung is clear except for atelectasis in the left base. There is cardiomegaly with pulmonary venous hypertension. No adenopathy evident. No bone lesions. IMPRESSION: Tube and catheter positions as described without pneumothorax. Significant clearing at the throughout the left lung and right upper lobe. There remains atelectatic change in each lower lung zone as well as underlying pulmonary vascular congestion. There is felt to be small left residual pleural effusion. Electronically Signed   By: Lowella Grip III M.D.   On: 08/01/2017 07:56   Dg Chest Port 1 View  Result Date: 07/31/2017 CLINICAL DATA:  Intubation and central line placement. EXAM: PORTABLE CHEST 1 VIEW COMPARISON:  Chest x-ray from same day at 0529. FINDINGS: Interval placement of an endotracheal tube with the tip approximately 1 cm above the level of the carina. Enteric tube seen entering the stomach. Left subclavian central venous catheter with the tip at the cavoatrial junction. Stable cardiomegaly. Unchanged complete opacification of the left hemithorax  due to large pleural effusion and left lung collapse. Increasing airspace disease within the right upper lobe. No acute osseous abnormality. IMPRESSION: 1. Interval placement of an endotracheal  tube with the tip approximately 1 cm above the level of the carina. Recommend retraction 2-3 cm. 2. Appropriately positioned left subclavian central venous catheter. 3. Unchanged large left pleural effusion and left lung collapse. 4. Worsening airspace disease in the right upper lobe which could reflect edema or infection. Electronically Signed   By: Titus Dubin M.D.   On: 07/31/2017 10:51   Dg Chest Port 1 View  Result Date: 07/31/2017 CLINICAL DATA:  Acute respiratory failure EXAM: PORTABLE CHEST 1 VIEW COMPARISON:  07/30/2017 FINDINGS: Dense consolidation of the left hemithorax unchanged due to effusion and collapse. Progression of right lower lobe atelectasis/infiltrate. Diffuse airspace disease on the right may represent edema. IMPRESSION: Persistent collapse of the left lung Progressive right lower lobe atelectasis/infiltrate and small right effusion. Electronically Signed   By: Franchot Gallo M.D.   On: 07/31/2017 07:26   Dg Abd Portable 1v  Result Date: 07/31/2017 CLINICAL DATA:  NG tube placement. EXAM: PORTABLE ABDOMEN - 1 VIEW COMPARISON:  None. FINDINGS: Enteric tube in place with the tip in the distal gastric body. Nonobstructive bowel gas pattern. No acute osseous abnormality. IMPRESSION: Enteric tube appropriately positioned with the tip in the distal gastric body. Electronically Signed   By: Titus Dubin M.D.   On: 07/31/2017 10:52     CBC Recent Labs  Lab 08/08/17 0435 08/10/17 0505 08/11/17 0508 08/13/17 0449 08/14/17 0445  WBC 12.4* 13.6* 11.8* 11.8* 10.7  HGB 14.8 16.5* 15.9 15.2 13.9  HCT 48.3* 52.7* 53.6* 49.5* 46.3  PLT 426 359 337 293 261  MCV 77.4* 78.6* 79.8* 79.8* 81.0  MCH 23.7* 24.7* 23.6* 24.4* 24.3*  MCHC 30.6* 31.4* 29.6* 30.6* 30.0*  RDW 21.8* 22.0* 21.9* 21.9* 22.1*  LYMPHSABS 1.2  --  1.5 1.6 1.6  MONOABS 0.7  --  1.4* 1.0* 0.9  EOSABS 0.0  --  0.1 0.1 0.1  BASOSABS 0.0  --  0.1 0.0 0.0    Chemistries  Recent Labs  Lab 08/08/17 0435  08/09/17 0516 08/10/17 0505 08/11/17 0508 08/13/17 0449 08/14/17 0445  NA 138 142 143 148* 150* 147*  K 5.0 4.4 4.4 3.9 3.8 3.6  CL 93* 95* 98* 102 103 106  CO2 34* 35* 34* 34* 30 32  GLUCOSE 148* 104* 148* 145* 163* 120*  BUN 54* 60* 70* 52* 42* 33*  CREATININE 0.73 0.89 0.80 0.67 0.80 0.65  CALCIUM 10.5* 11.1* 10.8* 10.4* 9.4 9.2  MG 2.8*  --   --  2.0 1.9 1.9  AST  --   --   --   --  19  --   ALT  --   --   --   --  34  --   ALKPHOS  --   --   --   --  68  --   BILITOT  --   --   --   --  1.8*  --    ------------------------------------------------------------------------------------------------------------------ estimated creatinine clearance is 90.3 mL/min (by C-G formula based on SCr of 0.65 mg/dL). ------------------------------------------------------------------------------------------------------------------ No results for input(s): HGBA1C in the last 72 hours. ------------------------------------------------------------------------------------------------------------------ No results for input(s): CHOL, HDL, LDLCALC, TRIG, CHOLHDL, LDLDIRECT in the last 72 hours. ------------------------------------------------------------------------------------------------------------------ No results for input(s): TSH, T4TOTAL, T3FREE, THYROIDAB in the last 72 hours.  Invalid input(s): FREET3 ------------------------------------------------------------------------------------------------------------------ No results for input(s): VITAMINB12, FOLATE,  FERRITIN, TIBC, IRON, RETICCTPCT in the last 72 hours.  Coagulation profile Recent Labs  Lab 08/14/17 0445  INR 1.21    No results for input(s): DDIMER in the last 72 hours.  Cardiac Enzymes No results for input(s): CKMB, TROPONINI, MYOGLOBIN in the last 168 hours.  Invalid input(s): CK ------------------------------------------------------------------------------------------------------------------ Invalid input(s):  POCBNP    Assessment & Plan  52 year old female patient currently in ICU for acute on chronic respiratory failure, pericardial effusion and heart failure exacerbation.  1. Acute on chronic respiratory failure with hypoxia Status post extubation by ENT in the OR today, continue monitoring in ICU for respiratory status.  Appreciate ENT, intensivist following. Patient is on Unasyn since yesterday.,  Continue bronchodilators 2. Acute on chronic diastolic heart failure Lasix as needed for diuresis, appreciate cardiology following regarding pericardial effusion also.  Patient has no tampon by echocardiogram.    3.  Left lung pneumonia versus pericarditis  Opacification of left hemithorax secondary to atelectasis and effusion  4.Pericardial effusion with no evidence of pericardial tamponade Cardiology follow-up appreciated  5. tube feeding started along with prokinetic. 6.  DVT prophylaxis with subcu heparin 5000 units every 8 hourly   hypernatremia, continue D5 at 50 mL an hour.,sodium down from 150 from 147    Code Status Orders  (From admission, onward)        Start     Ordered   07/29/17 2224  Full code  Continuous     07/29/17 2223    Code Status History    Date Active Date Inactive Code Status Order ID Comments User Context   06/07/2017 1347 06/12/2017 2013 Full Code [id number]  Bettey Costa, MD Inpatient      Time Spent in minutes   35 minutes  Greater than 50% of time spent in care coordination and counseling patient regarding the condition and plan of care.   Epifanio Lesches M.D on 08/14/2017 at 1:31 PM  Between 7am to 6pm - Pager - [phone]  After 6pm go to [email or web address] - Proofreader  [employer]   Office  [phone]

## 2017-08-14 NOTE — Anesthesia Post-op Follow-up Note (Signed)
Anesthesia QCDR form completed.        

## 2017-08-14 NOTE — Progress Notes (Signed)
Name: [name] MRN: [id number] DOB: [birth year]     CONSULTATION DATE: 07/29/2017  No major issues last night.  She was excessively extubated in the OR and has been tolerating nasal cannula.  PAST MEDICAL HISTORY :   has a past medical history of Anxiety, COPD (chronic obstructive pulmonary disease) (HCC), Essential hypertension, History of echocardiogram, Morbid obesity (HCC), and Tobacco abuse.  has a past surgical history that includes Cholecystectomy; Abdominal hysterectomy; and Hernia repair. Prior to Admission medications   Medication Sig Start Date End Date Taking? Authorizing Provider  albuterol (PROVENTIL HFA;VENTOLIN HFA) 108 (90 Base) MCG/ACT inhaler Inhale 2 puffs into the lungs every 6 (six) hours as needed for wheezing or shortness of breath. 06/12/17  Yes Gouru, Aruna, MD  amitriptyline (ELAVIL) 10 MG tablet Take 10 mg by mouth at bedtime as needed.  10/13/16 10/13/17 Yes [provider]  amLODipine (NORVASC) 5 MG tablet Take 5 mg by mouth daily. 03/14/17 03/14/18 Yes [provider]  aspirin EC 81 MG tablet Take 81 mg by mouth daily. 05/01/17 01/08/31 Yes [provider]  buPROPion (WELLBUTRIN SR) 100 MG 12 hr tablet Take 100 mg by mouth 2 (two) times daily.   Yes [provider]  clonazePAM (KLONOPIN) 0.5 MG tablet Take 0.5 mg by mouth 3 (three) times daily as needed.    Yes [provider]  cyclobenzaprine (FLEXERIL) 10 MG tablet Take 10 mg by mouth 3 (three) times daily as needed. 08/16/16  Yes [provider]  furosemide (LASIX) 20 MG tablet Take 1 tablet (20 mg total) by mouth 2 (two) times daily. Patient taking differently: Take 20 mg by mouth daily.  06/12/17 06/12/18 Yes Gouru, Aruna, MD  Mometasone Furoate 200 MCG/ACT AERO Inhale 2 puffs into the lungs.   Yes [provider]  naproxen sodium (ALEVE) 220 MG tablet Take 220 mg by mouth daily as needed.   Yes [provider]  nitroGLYCERIN (NITROSTAT)  0.4 MG SL tablet Place 0.4 mg under the tongue as needed. 05/01/17 05/01/18 Yes [provider]  senna-docusate (SENOKOT-S) 8.6-50 MG tablet Take 1 tablet by mouth at bedtime as needed for mild constipation. 06/12/17  Yes Gouru, Deanna Artis, MD   No Known Allergies  FAMILY HISTORY:  family history includes Dementia in her mother; Heart attack in her father. SOCIAL HISTORY:  reports that she has been smoking cigarettes.  She has a 30.00 pack-year smoking history. She has never used smokeless tobacco. She reports that she does not drink alcohol or use drugs.  REVIEW OF SYSTEMS:   Unable to obtain due to critical illness   VITAL SIGNS: Temp:  [98.3 F (36.8 C)-100.3 F (37.9 C)] 98.5 F (36.9 C) (04/02 1600) Pulse Rate:  [80-107] 90 (04/02 1600) Resp:  [15-24] 19 (04/02 1600) BP: (95-132)/(61-86) 121/82 (04/02 1600) SpO2:  [90 %-98 %] 98 % (04/02 1600) FiO2 (%):  [40 %] 40 % (04/02 0323) Weight:  [107.6 kg (237 lb 3.4 oz)] 107.6 kg (237 lb 3.4 oz) (04/02 0500)  Physical Examination:  Awake and oriented with no acute neurological deficits Tolerating nasal cannula, no distress,BEA E and no rales S1+S2 audible no murmur  benign abdomen was febrile for sepsis No leg edema  ASSESSMENT / PLAN:  Acute respiratory failure with difficult airway.  Patient was successfully extubated in OR with ENT.  She is tolerating nasal cannula no signs of respiratory distress. -Monitor work of breathing and oxygen saturation.  Prerenal azotemia(improved)with hypochloremia, hypercalcemia,hemoconcentration,and intravascular volume depletion was  possible over diuresis and secondary to the negative catabolic effect of the steroids -Optimize free water,offsteroids, monitor renal panel and urine output.  Diastolic CHF. -Optimize diuresis to improve lung compliance.  Pneumonia(Normal resp flora,sputum). Improved airspace disease. -Unasyn and d/c Zosyn, Start on [place].Consider de-escalation  of antimicrobial in a.m. if no SIRS. -Monitor CXR + CBC + FiO2.  Pericardial effusion with no evidence of pericardial tamponade.Hemodynamically stable -Management as per cardiology  Oral thrush -Diflucan  Full code  DVT &GI prophylaxis. Continue supportive care.  Critical care time 35 minutes

## 2017-08-14 NOTE — Anesthesia Postprocedure Evaluation (Signed)
Anesthesia Post Note  Patient: [name]  Procedure(s) Performed: EXTUBATION (ENDOTRACHEAL) IN OR (N/A )  Patient location during evaluation: PACU Anesthesia Type: MAC Level of consciousness: awake and alert Pain management: pain level controlled Vital Signs Assessment: post-procedure vital signs reviewed and stable Respiratory status: spontaneous breathing, nonlabored ventilation, respiratory function stable and patient connected to nasal cannula oxygen Cardiovascular status: blood pressure returned to baseline and stable Postop Assessment: no apparent nausea or vomiting Anesthetic complications: no     Last Vitals:  Vitals:   08/14/17 1200 08/14/17 1300  BP: 121/79 126/82  Pulse: 83 81  Resp: 19 (!) 21  Temp: 37.2 C   SpO2: 95% 92%    Last Pain:  Vitals:   08/14/17 1200  TempSrc: Axillary  PainSc:                  Skalicky,Alcie Runions S

## 2017-08-14 NOTE — Op Note (Signed)
08/14/2017  8:00 AM    Angel MeekerSonya D [name]  [id number]   Pre-Op Diagnosis:  Respiratory failure  Post-op Diagnosis: Same  Procedure:   Extubation in OR with stand-by for possible emergent tracheostomy  Surgeon:  Sandi MealyPaul S Burt Piatek   Assistant: Bud Facereighton Vaught  Anesthesia:  Stand-by services  EBL:  N/A  Complications:  None  Procedure: After the patient was identified in holding and the procedure was reviewed.  The patient was taken to the operating room and with the patient in a comfortable supine position with staff prepared for potential need for airway intervention, the ET tube cuff was deflated and the patient extubated. Anesthesia suctioned the airway and supplemental O2 was provided by mask. The patient was observed for several minutes with O2 sats over 95%, desaturating slightly without supplemental O2, but improving when this was re-applied. The patient was noted to be hoarse, but there was no audible stridor, and she was noted to be breathing comfortably.  The patient was taken to the recovery room in good condition.   Disposition:   PACU then to CCU  Plan: Resume CCU care with supplemental O2  Sandi Mealyaul S Lige Lakeman 08/14/2017 8:00 AM

## 2017-08-14 NOTE — Progress Notes (Signed)
Nutrition Follow-up  DOCUMENTATION CODES:   Morbid obesity  INTERVENTION:  Will discontinue liquid MVI as that was part of tube feeding order.  Provide Premier Protein po BID, each supplement provides 160 kcal and 30 grams of protein.  Encouraged adequate intake of protein at meals.  NUTRITION DIAGNOSIS:   Inadequate oral intake related to inability to eat as evidenced by NPO status.  Resolving - patient was advanced to diet today.  GOAL:   Patient will meet greater than or equal to 90% of their needs  Progressing.  MONITOR:   Vent status, Labs, Weight trends, TF tolerance, I & O's  REASON FOR ASSESSMENT:   Ventilator, Consult Enteral/tube feeding initiation and management  ASSESSMENT:   52 year old female with PMHx of COPD, tobacco abuse, anxiety, essential HTN who was admitted with atypical chest pain, hypoxemic respiratory failure, and pericardial effusion and then transferred to SDU on 3/18 with worsening hypoxemia and complete opacification of left hemithorax suspected to be due to atelectasis initially requiring BiPAP but was intubated on AM of 3/19.   -Patient was successfully extubated in the OR this morning by ENT. NGT was removed. -Following SLP evaluation this afternoon patient was advanced to dysphagia 3 diet with thin liquids.  Met with patient this AM following extubation. She was on nasal cannula. Voice was hoarse but still able to communicate. Reports she is ready to eat real food. She is looking forward to SLP evaluation. Denies any abdominal pain or N/V today.  Medications reviewed and include: Colace, Diflucan, Reglan, liquid MVI daily per tube, senna-docusate, Unasyn, vancomycin.  Labs reviewed: CBG 109-180, Sodium 147, BUN 33.  I/O: 1650 mL UOP yesterday (0.6 mL/kg/hr)  Weight trend: 107.6 kg on 4/2; wt trending back up  Discussed with RN and on rounds. Also discussed with SLP later in day.  Diet Order:  DIET DYS 3 Room service appropriate?  Yes with Assist; Fluid consistency: Thin Aspiration precautions  EDUCATION NEEDS:   No education needs have been identified at this time  Skin:  Skin Assessment: Reviewed RN Assessment  Last BM:  08/08/2017 - medium type 6  Height:   Ht Readings from Last 1 Encounters:  07/31/17 5' (1.524 m)    Weight:   Wt Readings from Last 1 Encounters:  08/14/17 237 lb 3.4 oz (107.6 kg)    Ideal Body Weight:  45.5 kg  BMI:  Body mass index is 46.33 kg/m.  Estimated Nutritional Needs:   Kcal:  2060-2400 (MSJ x 1.2-1.4)  Protein:  100-115 grams (0.9-1.1 grams/kg)  Fluid:  1.4-1.6 L/day (30-35 mL/kg IBW)  Willey Blade, MS, RD, LDN Office: [phone] Pager: [phone] After Hours/Weekend Pager: [phone]

## 2017-08-14 NOTE — Progress Notes (Signed)
Patient taken to OR for extubation at shift change (0700). Tolerated well and returned to unit around 0900. Patient on 4L Dorris, no respiratory distress. Patient's daughter at bedside. Speech therapy consulted. Per Dr. Duanne LimerickSamaan, okay to give patient ice chips as tolerated, but NPO otherwise until speech evaluation.  Trudee KusterBrandi R Mansfield

## 2017-08-14 NOTE — Transfer of Care (Signed)
Immediate Anesthesia Transfer of Care Note  Patient: MAHLET JERGENS  Procedure(s) Performed: EXTUBATION (ENDOTRACHEAL) IN OR (N/A ) TRACHEOSTOMY (N/A )  Patient Location: PACU  Anesthesia Type:MAC  Level of Consciousness: awake, alert  and oriented  Airway & Oxygen Therapy: Patient Spontanous Breathing and Patient connected to face mask oxygen  Post-op Assessment: Report given to RN and Post -op Vital signs reviewed and stable  Post vital signs: Reviewed and stable  Last Vitals:  Vitals Value Taken Time  BP 117/77 08/14/2017  7:59 AM  Temp 37.1 C 08/14/2017  7:59 AM  Pulse 101 08/14/2017  8:01 AM  Resp 22 08/14/2017  8:01 AM  SpO2 91 % 08/14/2017  8:01 AM  Vitals shown include unvalidated device data.  Last Pain:  Vitals:   08/13/17 2300  TempSrc: Axillary  PainSc:       Patients Stated Pain Goal: 0 (09/40/76 8088)  Complications: No apparent anesthesia complications

## 2017-08-14 NOTE — Care Management (Signed)
Patient extubated today. Patient must be able to tolerate 3+ hours of PT in order for inpatient rehab to assist with rehabilitation.  Right now, patient does not seem appropriate for inpatient rehab however this has not been eliminated as an opportunity. RNCM will follow progression for CIR.

## 2017-08-14 NOTE — Anesthesia Preprocedure Evaluation (Addendum)
Anesthesia Evaluation  Patient identified by MRN, date of birth, ID band Patient awake    Reviewed: Allergy & Precautions, NPO status , Patient's Chart, lab work & pertinent test results, reviewed documented beta blocker date and time   Airway Mallampati: Intubated  TM Distance: >3 FB     Dental  (+) Chipped, Partial Lower, Partial Upper   Pulmonary pneumonia, COPD, Current Smoker,           Cardiovascular hypertension, Pt. on medications      Neuro/Psych Anxiety    GI/Hepatic   Endo/Other    Renal/GU      Musculoskeletal   Abdominal   Peds  Hematology   Anesthesia Other Findings CXR shows cardiomegaly and vascular congestion. Obesity.  Reproductive/Obstetrics                            Anesthesia Physical Anesthesia Plan  ASA: IV  Anesthesia Plan:    Post-op Pain Management:    Induction: Intravenous  PONV Risk Score and Plan:   Airway Management Planned:   Additional Equipment:   Intra-op Plan:   Post-operative Plan:   Informed Consent: I have reviewed the patients History and Physical, chart, labs and discussed the procedure including the risks, benefits and alternatives for the proposed anesthesia with the patient or authorized representative who has indicated his/her understanding and acceptance.     Plan Discussed with: CRNA  Anesthesia Plan Comments:         Anesthesia Quick Evaluation

## 2017-08-14 NOTE — Consult Note (Signed)
Talked again with daughter and no new questions. No changes overnight and they have lightened her sedation. Proceed with extubation in OR with possible trach if needed.

## 2017-08-15 ENCOUNTER — Inpatient Hospital Stay: Payer: Medicaid Other

## 2017-08-15 LAB — CBC WITH DIFFERENTIAL/PLATELET
BASOS ABS: 0 10*3/uL (ref 0–0.1)
Basophils Relative: 1 %
Eosinophils Absolute: 0.3 10*3/uL (ref 0–0.7)
Eosinophils Relative: 4 %
HEMATOCRIT: 43.1 % (ref 35.0–47.0)
Hemoglobin: 13 g/dL (ref 12.0–16.0)
LYMPHS PCT: 18 %
Lymphs Abs: 1.3 10*3/uL (ref 1.0–3.6)
MCH: 24 pg — ABNORMAL LOW (ref 26.0–34.0)
MCHC: 30.2 g/dL — AB (ref 32.0–36.0)
MCV: 79.3 fL — AB (ref 80.0–100.0)
MONOS PCT: 8 %
Monocytes Absolute: 0.5 10*3/uL (ref 0.2–0.9)
NEUTROS ABS: 5.1 10*3/uL (ref 1.4–6.5)
Neutrophils Relative %: 69 %
Platelets: 247 10*3/uL (ref 150–440)
RBC: 5.44 MIL/uL — ABNORMAL HIGH (ref 3.80–5.20)
RDW: 21.5 % — AB (ref 11.5–14.5)
WBC: 7.2 10*3/uL (ref 3.6–11.0)

## 2017-08-15 LAB — BASIC METABOLIC PANEL
Anion gap: 7 (ref 5–15)
BUN: 18 mg/dL (ref 6–20)
CHLORIDE: 106 mmol/L (ref 101–111)
CO2: 32 mmol/L (ref 22–32)
Calcium: 9.2 mg/dL (ref 8.9–10.3)
Creatinine, Ser: 0.48 mg/dL (ref 0.44–1.00)
GFR calc Af Amer: >60 mL/min (ref >60)
GFR calc non Af Amer: >60 mL/min (ref >60)
Glucose, Bld: 106 mg/dL — ABNORMAL HIGH (ref 65–99)
POTASSIUM: 3.4 mmol/L — AB (ref 3.5–5.1)
SODIUM: 145 mmol/L (ref 135–145)

## 2017-08-15 LAB — GLUCOSE, CAPILLARY
GLUCOSE-CAPILLARY: 102 mg/dL — AB (ref 65–99)
GLUCOSE-CAPILLARY: 96 mg/dL (ref 65–99)
Glucose-Capillary: 98 mg/dL (ref 65–99)
Glucose-Capillary: 147 mg/dL — ABNORMAL HIGH (ref 65–99)
Glucose-Capillary: 84 mg/dL (ref 65–99)

## 2017-08-15 LAB — CALCIUM, IONIZED: CALCIUM, IONIZED, SERUM: 5.2 mg/dL (ref 4.5–5.6)

## 2017-08-15 LAB — MAGNESIUM: Magnesium: 1.6 mg/dL — ABNORMAL LOW (ref 1.7–2.4)

## 2017-08-15 LAB — PHOSPHORUS: Phosphorus: 3.9 mg/dL (ref 2.5–4.6)

## 2017-08-15 MED ORDER — FUROSEMIDE 20 MG PO TABS
20.0000 mg | ORAL_TABLET | Freq: Every day | ORAL | Status: DC
  Administered 2017-08-15 – 2017-08-17 (×3): 20 mg via ORAL
  Filled 2017-08-15 (×3): qty 1

## 2017-08-15 MED ORDER — POTASSIUM CHLORIDE 10 MEQ/50ML IV SOLN
10.0000 meq | INTRAVENOUS | Status: AC
  Administered 2017-08-15 (×2): 10 meq via INTRAVENOUS
  Filled 2017-08-15 (×2): qty 50

## 2017-08-15 MED ORDER — MAGNESIUM SULFATE 2 GM/50ML IV SOLN
2.0000 g | Freq: Once | INTRAVENOUS | Status: AC
  Administered 2017-08-15: 2 g via INTRAVENOUS
  Filled 2017-08-15: qty 50

## 2017-08-15 NOTE — Progress Notes (Signed)
Physical Therapy Treatment Patient Details Name: [name] MRN: [id number] DOB: [birth year] Today's Date: 08/15/2017    History of Present Illness Pt is a 52 y.o. female presenting to hospital 07/29/17 with chest pain and increasing SOB.  Pt admitted to hospital with acute on chronic respiratory failure with hypoxia secondary acute on chronic diastolic CHF, pericardial effusion, pleural effusion, htn, and L sided PNA.  Transferred to CCU 07/30/17 and intubated 07/31/17 (very difficult airway and difficult to wean).  Pt extubated 08/14/17 and transferred to general medical floor.  PMH includes COPD (4 L home O2), htn, (+) smoking, anxiety.    PT Comments    Pt demonstrated bed mobility min to mod assist (x 2 physical assist), attempted sit to stand x 3 RW max assist (x 2 physical assist), and attempted lateral scooting mod assist x1 (see mobility section for details). Pt stated motivation to work with PT and get stronger, but was intermittently tearful and reported near fall w/ nursing earlier today during toileting; Pt stated she was feeling overwhelmed and fearful at the moment. Pt reported feeling weak from extended stay in bed during current acute hospitalization. O2 sats monitored throughout session, while on 5L oxygen; O2 sats stayed > 94% throughout session. PT will focus on progression of strength and transfers at next session.   Follow Up Recommendations  CIR     Equipment Recommendations  Rolling walker with 5" wheels;3in1 (PT)    Recommendations for Other Services       Precautions / Restrictions Precautions Precautions: Fall Restrictions Weight Bearing Restrictions: No    Mobility  Bed Mobility Overal bed mobility: Needs Assistance Bed Mobility: Supine to Sit;Sit to Supine     Supine to sit: +2 for physical assistance;Mod assist;Min assist Sit to supine: Max assist;+2 for physical assistance   General bed mobility comments: supine to sit assist to bring trunk forward;  vc's to bring BLE across bed, push BUE bed to help sit up, use bedrail to pull trunk forward, scoot hips to EOB. Sit to supine max assist x2 to bring trunk back, lift BLE onto bed, and boost up into bed.  Transfers Overall transfer level: Needs assistance Equipment used: Rolling walker (2 wheeled) Transfers: Sit to/from Stand;Lateral/Scoot Transfers Sit to Stand: +2 physical assistance;Max assist        Lateral/Scoot Transfers: Mod assist General transfer comment: Pt attempted to stand x3; therapist on each side blocking B knees; assist to lift; vc's to bring hips forward, straighten legs, and stand tall. Pt fatigued quickly and reported pain in B knees and B hips. Therapist then instructed Pt in lateral scooting technique and Pt attempted to scoot to the Right into the chair; Pt stated fatigue and requested to stop activity for the day; Pt was returned to bed (see sit to supine for details)   Ambulation/Gait                 Stairs            Wheelchair Mobility    Modified Rankin (Stroke Patients Only)       Balance Overall balance assessment: Needs assistance Sitting-balance support: Feet unsupported(Pt is 5 ft tall and feet do not reach the floor w/ bed lowest level) Sitting balance-Leahy Scale: Fair Sitting balance - Comments: demonstrated fair sitting balance EOB     Standing balance-Leahy Scale: Zero Standing balance comment: Pt attempted stand x3 (see transfers for details)  Cognition Arousal/Alertness: Awake/alert Behavior During Therapy: WFL for tasks assessed/performed Overall Cognitive Status: Within Functional Limits for tasks assessed                                 General Comments: Pt A&O x 4      Exercises      General Comments  Pt agreeable to session.      Pertinent Vitals/Pain Pain Assessment: No/denies pain Pain Score: 0-No pain Faces Pain Scale: Hurts a little bit Pain Location:  L knee and hips Pain Intervention(s): Limited activity within patient's tolerance;Monitored during session    Home Living                      Prior Function            PT Goals (current goals can now be found in the care plan section) Acute Rehab PT Goals Patient Stated Goal: I want to get stronger PT Goal Formulation: With patient Time For Goal Achievement: 08/24/17 Potential to Achieve Goals: Fair Progress towards PT goals: Progressing toward goals    Frequency    7X/week      PT Plan Current plan remains appropriate    Co-evaluation              AM-PAC PT "6 Clicks" Daily Activity  Outcome Measure  Difficulty turning over in bed (including adjusting bedclothes, sheets and blankets)?: A Lot Difficulty moving from lying on back to sitting on the side of the bed? : Unable Difficulty sitting down on and standing up from a chair with arms (e.g., wheelchair, bedside commode, etc,.)?: Unable Help needed moving to and from a bed to chair (including a wheelchair)?: Total Help needed walking in hospital room?: Total Help needed climbing 3-5 steps with a railing? : Total 6 Click Score: 7    End of Session Equipment Utilized During Treatment: Gait belt;Oxygen Activity Tolerance: Patient limited by fatigue;Patient limited by pain Patient left: in bed;with call bell/phone within reach;with bed alarm set;with family/visitor present Nurse Communication: Mobility status(Rn notified of pain during sit to stand transfer) PT Visit Diagnosis: Muscle weakness (generalized) (M62.81);Other abnormalities of gait and mobility (R26.89);Difficulty in walking, not elsewhere classified (R26.2)     Time: [id number] PT Time Calculation (min) (ACUTE ONLY): 42 min  Charges:                       G Codes:         [name], SPT 08/15/2017, 4:47 PM

## 2017-08-15 NOTE — Progress Notes (Signed)
Report given to Chris, RN on [facility]. Patient and daughter notified of transfer and updated on care. No questions. Patient transferred on cardiac monitoring with SWOT nurse. CCMD notified. Trudee KusterBrandi R Mansfield

## 2017-08-15 NOTE — Progress Notes (Signed)
Name: Reinaldo MeekerSonya D Yingling MRN: [id number] DOB: 1965/06/17     CONSULTATION DATE: 07/29/2017  Patient was successfully extubated yesterday and has been tolerating [place]. No major issues last night.  PAST MEDICAL HISTORY :   has a past medical history of Anxiety, COPD (chronic obstructive pulmonary disease) (HCC), Essential hypertension, History of echocardiogram, Morbid obesity (HCC), and Tobacco abuse.  has a past surgical history that includes Cholecystectomy; Abdominal hysterectomy; Hernia repair; and Extubation (endotracheal) in or (N/A, 08/14/2017). Prior to Admission medications   Medication Sig Start Date End Date Taking? Authorizing Provider  albuterol (PROVENTIL HFA;VENTOLIN HFA) 108 (90 Base) MCG/ACT inhaler Inhale 2 puffs into the lungs every 6 (six) hours as needed for wheezing or shortness of breath. 06/12/17  Yes Gouru, Aruna, MD  amitriptyline (ELAVIL) 10 MG tablet Take 10 mg by mouth at bedtime as needed.  10/13/16 10/13/17 Yes [provider]  amLODipine (NORVASC) 5 MG tablet Take 5 mg by mouth daily. 03/14/17 03/14/18 Yes [provider]  aspirin EC 81 MG tablet Take 81 mg by mouth daily. 05/01/17 01/08/31 Yes [provider]  buPROPion (WELLBUTRIN SR) 100 MG 12 hr tablet Take 100 mg by mouth 2 (two) times daily.   Yes [provider]  clonazePAM (KLONOPIN) 0.5 MG tablet Take 0.5 mg by mouth 3 (three) times daily as needed.    Yes [provider]  cyclobenzaprine (FLEXERIL) 10 MG tablet Take 10 mg by mouth 3 (three) times daily as needed. 08/16/16  Yes [provider]  furosemide (LASIX) 20 MG tablet Take 1 tablet (20 mg total) by mouth 2 (two) times daily. Patient taking differently: Take 20 mg by mouth daily.  06/12/17 06/12/18 Yes Gouru, Aruna, MD  Mometasone Furoate 200 MCG/ACT AERO Inhale 2 puffs into the lungs.   Yes [provider]  naproxen sodium (ALEVE) 220 MG tablet Take 220 mg by mouth daily as needed.   Yes [provider]  nitroGLYCERIN (NITROSTAT) 0.4 MG SL tablet Place 0.4 mg under the tongue as needed. 05/01/17 05/01/18 Yes [provider]  senna-docusate (SENOKOT-S) 8.6-50 MG tablet Take 1 tablet by mouth at bedtime as needed for mild constipation. 06/12/17  Yes Gouru, Deanna ArtisAruna, MD   No Known Allergies  FAMILY HISTORY:  family history includes Dementia in her mother; Heart attack in her father. SOCIAL HISTORY:  reports that she has been smoking cigarettes.  She has a 30.00 pack-year smoking history. She has never used smokeless tobacco. She reports that she does not drink alcohol or use drugs.  REVIEW OF SYSTEMS:   Unable to obtain due to critical illness   VITAL SIGNS: Temp:  [98 F (36.7 C)-98.9 F (37.2 C)] 98 F (36.7 C) (04/03 0800) Pulse Rate:  [71-91] 88 (04/03 0900) Resp:  [12-21] 19 (04/03 0900) BP: (101-126)/(65-86) 112/69 (04/03 0900) SpO2:  [89 %-98 %] 89 % (04/03 0900) Weight:  [108 kg (238 lb 1.6 oz)] 108 kg (238 lb 1.6 oz) (04/03 0421)  Physical Examination:  Awake and oriented with no acute neurological deficits Tolerating nasal cannula, no distress,BEA E and no rales S1+S2 audible no murmur  benign abdomen was febrile for sepsis No leg edema  ASSESSMENT / PLAN:  Acute respiratory failure with difficult airway.  Patient was successfully extubated in OR with ENT.  She is tolerating nasal cannula no signs of respiratory distress. -Monitor work of breathing and oxygen saturation.  Prerenal azotemia(improved)with hypochloremia, hypercalcemia,hemoconcentration,and intravascular volume depletion was possible over diuresis and secondary to the  negative catabolic effect of the steroids -Optimizefree water,offsteroids, monitor renal panel and urine output.  Diastolic CHF.LV EF 75% -Optimize diuresis to improve lung compliance.  Pneumonia(Normal resp flora,sputum). Improved airspace disease. -Unasyn and d/c Van.Consider de-escalation of  antimicrobial in a.m. if no SIRS. -Monitor CXR + CBC + FiO2.  Pericardial effusion withno evidence of pericardial tamponade.Hemodynamically stable -Management as per cardiology  Oral thrush -Diflucan  Hypokalemia and hypomagnesemia -Replete and monitor electrolytes.  Full code  DVT &GI prophylaxis. Continue supportive care. critical care time 35 min

## 2017-08-15 NOTE — Progress Notes (Addendum)
[employer] [facility] [place] [facility]                                                                                                                                                                                  Patient Demographics   [name], is a 52 y.o. female, DOB - [birth year], ZOX:[id number]  Admit date - 07/29/2017   Admitting Physician Altamese Dilling, MD  Outpatient Primary MD for the patient is [name], Nadyne Coombes, MD   LOS - 17  Subjective: Patient being transferred out of ICU to regular floor today, started on diet with speech therapy.  Denies any shortness of breath.  Review of Systems:     Vitals:   Vitals:   08/15/17 0900 08/15/17 1000 08/15/17 1100 08/15/17 1156  BP: 112/69 118/79 112/73 119/78  Pulse: 88 80  88  Resp: 19 17 18    Temp:    98.1 F (36.7 C)  TempSrc:    Oral  SpO2: (!) 89% 90%  95%  Weight:      Height:        Wt Readings from Last 3 Encounters:  08/15/17 108 kg (238 lb 1.6 oz)  06/12/17 119 kg (262 lb 6.4 oz)  09/29/16 117.9 kg (260 lb)     Intake/Output Summary (Last 24 hours) at 08/15/2017 1252 Last data filed at 08/15/2017 1104 Gross per 24 hour  Intake 1034.17 ml  Output 1065 ml  Net -30.83 ml    Physical Exam:   GENERAL: 52 year old female in no distress. FiO2 40% PEEP 5pressure support 10 HEAD, EYES, EARS, NOSE AND THROAT: Atraumatic, normocephalic. Extraocular muscles are intact. Pupils equal and reactive to light. Sclerae anicteric. No conjunctival injection.  NECK: Supple. There is no jugular venous distention. No bruits, no lymphadenopathy, no thyromegaly.  HEART: Regular rate and rhythm,. No murmurs, no rubs, no clicks.  LUNGS: Clear to auscultation, no wheezing.  ABDOMEN: Soft, flat, nontender, nondistended. Has good bowel sounds. No hepatosplenomegaly appreciated.  EXTREMITIES: No evidence of any cyanosis, clubbing, or peripheral edema.  +2 pedal and radial pulses bilaterally.  NEUROLOGIC: The  patient is alert, awake  On ventilator SKIN: Moist and warm with no rashes appreciated.  Psych: could not be assessed LN: No inguinal LN enlargement    Antibiotics   Anti-infectives (From admission, onward)   Start     Dose/Rate Route Frequency Ordered Stop   08/15/17 1000  fluconazole (DIFLUCAN) tablet 100 mg     100 mg Oral Daily 08/14/17 2106     08/13/17 2300  vancomycin (VANCOCIN) IVPB 1000 mg/200 mL premix  Status:  Discontinued     1,000 mg 200 mL/hr over 60 Minutes  Intravenous Every 12 hours 08/13/17 1920 08/15/17 1135   08/13/17 1845  vancomycin (VANCOCIN) IVPB 1000 mg/200 mL premix     1,000 mg 200 mL/hr over 60 Minutes Intravenous  Once 08/13/17 1831 08/13/17 1950   08/13/17 1400  Ampicillin-Sulbactam (UNASYN) 3 g in sodium chloride 0.9 % 100 mL IVPB  Status:  Discontinued     3 g 200 mL/hr over 30 Minutes Intravenous Every 6 hours 08/13/17 1253 08/15/17 1135   08/13/17 1000  fluconazole (DIFLUCAN) tablet 100 mg  Status:  Discontinued     100 mg Per Tube Daily 08/12/17 1134 08/14/17 2106   08/12/17 1145  fluconazole (DIFLUCAN) IVPB 200 mg     200 mg 100 mL/hr over 60 Minutes Intravenous  Once 08/12/17 1134 08/12/17 1342   08/10/17 1600  piperacillin-tazobactam (ZOSYN) IVPB 3.375 g  Status:  Discontinued     3.375 g 12.5 mL/hr over 240 Minutes Intravenous Every 8 hours 08/10/17 1550 08/13/17 1050   07/31/17 0800  ceFEPIme (MAXIPIME) 2 g in sodium chloride 0.9 % 100 mL IVPB     2 g 200 mL/hr over 30 Minutes Intravenous Every 8 hours 07/31/17 0627 08/07/17 0030   07/29/17 1915  ceFEPIme (MAXIPIME) 2 g in sodium chloride 0.9 % 100 mL IVPB     2 g 200 mL/hr over 30 Minutes Intravenous  Once 07/29/17 1911 07/29/17 2050   07/29/17 1915  vancomycin (VANCOCIN) IVPB 1000 mg/200 mL premix     1,000 mg 200 mL/hr over 60 Minutes Intravenous  Once 07/29/17 1911 07/29/17 2342      Medications   Scheduled Meds: . budesonide (PULMICORT) nebulizer solution  0.5 mg Nebulization  BID  . docusate  100 mg Oral BID  . famotidine  20 mg Oral BID  . fluconazole  100 mg Oral Daily  . furosemide  20 mg Oral Daily  . insulin aspart  0-15 Units Subcutaneous Q4H  . ipratropium-albuterol  3 mL Nebulization Q4H  . mouth rinse  15 mL Mouth Rinse BID  . metoCLOPramide (REGLAN) injection  5 mg Intravenous Q12H  . protein supplement shake  11 oz Oral BID BM  . senna-docusate  1 tablet Oral BID  . sertraline  50 mg Oral QHS  . sodium chloride flush  10-40 mL Intracatheter Q12H   Continuous Infusions: . fentaNYL infusion INTRAVENOUS Stopped (08/13/17 2358)   PRN Meds:.acetaminophen (TYLENOL) oral liquid 160 mg/5 mL, albuterol, bisacodyl, fentaNYL, LORazepam, ondansetron (ZOFRAN) IV, sodium chloride flush   Data Review:   Micro Results Recent Results (from the past 240 hour(s))  Culture, respiratory (NON-Expectorated)     Status: None   Collection Time: 08/10/17  2:32 PM  Result Value Ref Range Status   Specimen Description TRACHEAL ASPIRATE  Final   Special Requests   Final    NONE Performed at [facility], [address] [place], [place] [address]    Gram Stain   Final    ABUNDANT WBC PRESENT, PREDOMINANTLY PMN RARE SQUAMOUS EPITHELIAL CELLS PRESENT MODERATE GRAM POSITIVE COCCI IN CHAINS    Culture   Final    FEW Consistent with normal respiratory flora. Performed at [facility], [address] [place], [place] [address]    Report Status 08/12/2017 FINAL  Final  Culture, respiratory (NON-Expectorated)     Status: None (Preliminary result)   Collection Time: 08/13/17  6:30 PM  Result Value Ref Range Status   Specimen Description   Final    TRACHEAL ASPIRATE Performed at  [facility], [address] [place], [place] [address]    Special Requests   Final    NONE Performed at [facility], [address] [place], [place] [address]    Gram Stain   Final    RARE WBC PRESENT, PREDOMINANTLY PMN NO ORGANISMS SEEN     Culture   Final    CULTURE REINCUBATED FOR BETTER GROWTH Performed at [facility], [address] [place], [place] [address]    Report Status PENDING  Incomplete  CULTURE, BLOOD (ROUTINE X 2) w Reflex to ID Panel     Status: None (Preliminary result)   Collection Time: 08/13/17  7:06 PM  Result Value Ref Range Status   Specimen Description BLOOD BLOOD RIGHT HAND  Final   Special Requests   Final    BOTTLES DRAWN AEROBIC AND ANAEROBIC Blood Culture results may not be optimal due to an inadequate volume of blood received in culture bottles   Culture   Final    NO GROWTH 2 DAYS Performed at [facility], [address] [place], [place] [address]    Report Status PENDING  Incomplete  CULTURE, BLOOD (ROUTINE X 2) w Reflex to ID Panel     Status: None (Preliminary result)   Collection Time: 08/13/17  7:13 PM  Result Value Ref Range Status   Specimen Description BLOOD BLOOD LEFT HAND  Final   Special Requests   Final    BOTTLES DRAWN AEROBIC AND ANAEROBIC Blood Culture adequate volume   Culture   Final    NO GROWTH 2 DAYS Performed at [facility], [address] [place], [place] [address]    Report Status PENDING  Incomplete    Radiology Reports Dg Chest 2 View  Result Date: 07/30/2017 CLINICAL DATA:  Shortness of breath. Acute on chronic respiratory failure. Hypoxia. Morbid obesity. EXAM: CHEST - 2 VIEW COMPARISON:  Chest x-ray and CT chest 07/29/2017. FINDINGS: Cardiac silhouette is partially silhouetted by large pleural effusion on the LEFT. There is a whiteout of the LEFT hemithorax, which could represent mucous plugging and or increasing effusion. The RIGHT lung demonstrates interstitial edema with small effusion. IMPRESSION: Marked worsening aeration. Complete opacity of the LEFT hemithorax represents a significant change from yesterday's radiograph. Electronically Signed   By: Elsie StainJohn T Curnes M.D.   On: 07/30/2017 08:45   Dg Chest 2 View  Result Date:  07/29/2017 CLINICAL DATA:  52 year old female with history of chest pain since last week. Pain most severe during deep breathing. EXAM: CHEST - 2 VIEW COMPARISON:  Chest x-ray 06/10/2017. FINDINGS: Left lower lobe airspace consolidation concerning for pneumonia. Small bilateral pleural effusions. Mild diffuse peribronchial cuffing. Mild cardiomegaly. Upper mediastinal contours appear widened, but are stable compared to prior examinations. IMPRESSION: 1. Left lower lobe pneumonia with small bilateral pleural effusions. Followup PA and lateral chest X-ray is recommended in 3-4 weeks following trial of antibiotic therapy to ensure resolution and exclude underlying malignancy. 2. Mild cardiomegaly. Electronically Signed   By: Trudie Reedaniel  Entrikin M.D.   On: 07/29/2017 17:37   Dg Abd 1 View  Result Date: 08/12/2017 CLINICAL DATA:  Dyspepsia.  Difficulty feeding. EXAM: ABDOMEN - 1 VIEW COMPARISON:  08/07/2017 FINDINGS: The enteric tube tip projects over the expected location of the distal stomach. Side port is well below GE junction. Cholecystectomy clips noted. No abnormal bowel dilatation identified. IMPRESSION: 1. NG tube tip within the body of stomach. 2. No abnormal bowel dilatation noted. Electronically Signed  By: Signa Kell M.D.   On: 08/12/2017 10:40   Dg Abd 1 View  Result Date: 08/07/2017 CLINICAL DATA:  Fecal impaction. EXAM: ABDOMEN - 1 VIEW COMPARISON:  07/31/2017 FINDINGS: Nasogastric tube has its tip in the midportion of the stomach. Clips in the right upper quadrant consistent with previous cholecystectomy. Single clip in the right lower quadrant, possibly appendectomy. Moderate amount of fecal matter within the right colon. No sign of obstruction. IMPRESSION: Nasogastric tube in the mid stomach. Moderate amount of fecal matter in the right colon. No sign of obstruction. Electronically Signed   By: Paulina Fusi M.D.   On: 08/07/2017 11:32   Ct Chest Wo Contrast  Result Date:  07/31/2017 CLINICAL DATA:  She presented to [facility] ER on 03/17 with c/o intermittent chest pain worse during inspiration and nonproductive cough onset 1 week prior to presentation. On 03/18 pt developed hypotension and worsening acute hypoxic respiratory failure. EXAM: CT CHEST WITHOUT CONTRAST TECHNIQUE: Multidetector CT imaging of the chest was performed following the standard protocol without IV contrast. COMPARISON:  07/29/2017, 06/10/2017 FINDINGS: Cardiovascular: Stable cardiomegaly. Small pericardial effusion. Dilatation of main pulmonary artery as well as the right and left main pulmonary arteries as can be seen with pulmonary arterial hypertension. Thoracic aorta is normal in caliber. Mediastinum/Nodes: Enlarged prevascular lymph node measuring 17 mm in short axis. Multiple other smaller mediastinal lymph nodes are noted. Thyroid gland, trachea, and esophagus demonstrate no significant findings. Lungs/Pleura: Trace right pleural effusion. Small left pleural effusion. Left lower lobe airspace disease with air bronchograms concerning for pneumonia. Improved aeration of left upper lobe. Mild persistent right upper lobe airspace disease likely reflecting atelectasis. Stable 6 mm right middle lobe pulmonary nodule unchanged compared with 09/01/2013. Upper Abdomen: Aortic atherosclerosis. Musculoskeletal: No acute osseous abnormality. No aggressive osseous lesion. IMPRESSION: 1. Left lower lobe airspace disease with air bronchograms concerning for combination of pneumonia and atelectasis. Mild mediastinal lymphadenopathy likely reactive. 2. Small left pleural effusion.  Mild right upper lobe atelectasis. 3.  Aortic Atherosclerosis (ICD10-I70.0). Electronically Signed   By: Elige Ko   On: 07/31/2017 12:47   Ct Angio Chest Pe W And/or Wo Contrast  Result Date: 07/29/2017 CLINICAL DATA:  52 year old female with history of chest pain since last week. Difficulty taking deep breaths. EXAM: CT ANGIOGRAPHY CHEST  WITH CONTRAST TECHNIQUE: Multidetector CT imaging of the chest was performed using the standard protocol during bolus administration of intravenous contrast. Multiplanar CT image reconstructions and MIPs were obtained to evaluate the vascular anatomy. CONTRAST:  75mL ISOVUE-370 IOPAMIDOL (ISOVUE-370) INJECTION 76% COMPARISON:  Chest CT 06/10/2017. FINDINGS: Cardiovascular: No filling defects within the pulmonary arterial tree to suggest underlying pulmonary embolism. Heart size is normal. Moderate volume of pericardial fluid with pericardial enhancement concerning for pericarditis. No pericardial calcification. There is aortic atherosclerosis, as well as atherosclerosis of the great vessels of the mediastinum and the coronary arteries, including calcified atherosclerotic plaque in the left anterior descending coronary artery. Mediastinum/Nodes: Multiple prominent borderline enlarged and enlarged mediastinal lymph nodes, largest of which is in the prevascular nodal station measuring up to 22 mm in short axis, increased compared to the prior study. Esophagus is unremarkable in appearance. No axillary lymphadenopathy. Lungs/Pleura: Moderate left pleural effusion with near complete passive atelectasis of the left lower lobe. Some dependent subsegmental atelectasis is also noted in the left upper lobe. Trace right pleural effusion. Throughout the remaining portions of the lungs there is a background of ground-glass attenuation and mild interlobular septal thickening, indicative of  a background of mild interstitial pulmonary edema. 5 mm right middle lobe pulmonary nodule (axial image 47 of series 7), stable dating back to at least 09/01/2013, considered definitively benign. Upper Abdomen: Aortic atherosclerosis. Musculoskeletal: There are no aggressive appearing lytic or blastic lesions noted in the visualized portions of the skeleton. Review of the MIP images confirms the above findings. IMPRESSION: 1. No evidence of  pulmonary embolism. 2. Extensive pericardial enhancement with moderate volume of pericardial fluid, indicative of an acute pericarditis. 3. This is associated with increasing mediastinal lymphadenopathy compared to the prior study. 4. In addition, there is evidence of mild pulmonary edema and bilateral pleural effusions (left greater than right) with extensive areas of passive atelectasis in the left lung. 5. Aortic atherosclerosis, in addition to left anterior descending coronary artery disease. Please note that although the presence of coronary artery calcium documents the presence of coronary artery disease, the severity of this disease and any potential stenosis cannot be assessed on this non-gated CT examination. Assessment for potential risk factor modification, dietary therapy or pharmacologic therapy may be warranted, if clinically indicated. Aortic Atherosclerosis (ICD10-I70.0). Electronically Signed   By: Trudie Reed M.D.   On: 07/29/2017 19:45   Dg Chest Port 1 View  Result Date: 08/15/2017 CLINICAL DATA:  Status postextubation EXAM: PORTABLE CHEST 1 VIEW COMPARISON:  August 14, 2017 FINDINGS: Endotracheal tube and nasogastric tube have been removed. Central catheter tip is in superior vena cava. No pneumothorax. There is persistent cardiomegaly with pulmonary vascularity within normal limits. There is trace interstitial edema. There is no appreciable consolidation. No new opacity. No adenopathy. No bone lesions. IMPRESSION: Central catheter tip in superior vena cava without pneumothorax. Cardiomegaly with trace interstitial edema. No consolidation. No new opacity. Electronically Signed   By: Bretta Bang III M.D.   On: 08/15/2017 07:38   Dg Chest Port 1 View  Result Date: 08/14/2017 CLINICAL DATA:  Pneumonia EXAM: PORTABLE CHEST 1 VIEW COMPARISON:  08/13/2017, 08/11/2017, 08/10/2017 FINDINGS: Endotracheal tube tip is about 2.6 cm superior to the carina. Left-sided central venous catheter  tip overlies the SVC. Esophageal tube tip is below the diaphragm but non included. Cardiomegaly with vascular congestion and mild diffuse pulmonary edema. Superimposed airspace disease at the bases, no significant change. Negative for pneumothorax. IMPRESSION: 1. Cardiomegaly with vascular congestion and mild edema, no significant change 2. Superimposed airspace disease at the bilateral lung bases, atelectasis versus pneumonia, also without significant interval change compared to most recent prior. Electronically Signed   By: Jasmine Pang M.D.   On: 08/14/2017 03:04   Dg Chest Port 1 View  Result Date: 08/13/2017 CLINICAL DATA:  Pneumonia EXAM: PORTABLE CHEST 1 VIEW COMPARISON:  08/11/2017 FINDINGS: Endotracheal tube in good position. Central venous catheter tip SVC. NG tube in the stomach. Bilateral airspace disease unchanged, most prominent in the lung bases. Small effusions bilaterally. IMPRESSION: Endotracheal tube and central line unchanged in position Diffuse bilateral airspace disease basilar predominant unchanged. CHF versus pneumonia. Electronically Signed   By: Marlan Palau M.D.   On: 08/13/2017 07:30   Dg Chest Port 1 View  Result Date: 08/11/2017 CLINICAL DATA:  Pneumonia EXAM: PORTABLE CHEST 1 VIEW COMPARISON:  Yesterday FINDINGS: Endotracheal tube tip at the clavicular heads. Left subclavian central line with tip at the distal SVC. An orogastric tube reaches the stomach. Indistinct opacities at the lung bases. Generalized interstitial coarsening that is stable. Unchanged cardiopericardial enlargement. IMPRESSION: 1. Stable positioning of tubes and central line. 2. History of pneumonia with unchanged  lung opacity. 3. Cardiopericardial enlargement. Pericardial effusion by recent chest CT. Electronically Signed   By: Marnee Spring M.D.   On: 08/11/2017 07:45   Dg Chest Port 1 View  Result Date: 08/10/2017 CLINICAL DATA:  Rhonchi, acute on chronic respiratory failure with hypoxia, heart  failure, pericardial effusion, pneumonia versus pericarditis, COPD EXAM: PORTABLE CHEST 1 VIEW COMPARISON:  Portable exam 1445 hours compared to 08/08/2017 FINDINGS: Tip of endotracheal tube projects 3.0 cm above carina. Nasogastric tube extends into stomach. LEFT subclavian central venous catheter tip projects over SVC. Enlargement of cardiac silhouette with vascular congestion. Increased RIGHT lung infiltrate. Questionable LEFT lower lobe atelectasis versus consolidation. No gross pleural effusion or pneumothorax. IMPRESSION: Increased interstitial infiltrates in RIGHT lung with questionable atelectasis versus infiltrate in LEFT lower lobe. Enlargement of cardiac silhouette with pulmonary vascular congestion. Sign report Electronically Signed   By: Ulyses Southward M.D.   On: 08/10/2017 15:08   Dg Chest Port 1 View  Result Date: 08/08/2017 CLINICAL DATA:  Acute respiratory distress. History of acute on chronic respiratory failure, pericardial effusion, COPD, current smoker. EXAM: PORTABLE CHEST 1 VIEW COMPARISON:  Portable chest x-ray of August 07, 2017 FINDINGS: The lungs are well-expanded. Persistent bibasilar interstitial and airspace opacities are present. The cardiac silhouette is enlarged. The pulmonary vascularity is mildly congested. The endotracheal tube tip lies 3.3 cm above the carina. The esophagogastric tube tip projects below the inferior margin of the image. The left internal jugular venous catheter tip projects over the junction of the proximal and midportions of the SVC. IMPRESSION: Bibasilar atelectasis or pneumonia. Mild CHF. The support tubes are in reasonable position. Electronically Signed   By: David  Swaz[place]and M.D.   On: 08/08/2017 09:39   Dg Chest Port 1 View  Result Date: 08/07/2017 CLINICAL DATA:  Hypoxia EXAM: PORTABLE CHEST 1 VIEW COMPARISON:  August 05, 2017 FINDINGS: Endotracheal tube tip is 4.0 cm above the carina. Central catheter tip is in superior vena cava. Nasogastric tube tip  and side port are below the diaphragm. No pneumothorax. There are small pleural effusions bilaterally. There is consolidation in the left lower lobe as well as to a lesser extent in the right base. Heart is enlarged with pulmonary venous hypertension. No adenopathy. No bone lesions. IMPRESSION: Tube and catheter positions as described without pneumothorax. Bibasilar consolidation, more on the left than on the right, likely multifocal pneumonia. A degree of alveolar edema in the bases is possible. There is underlying pulmonary vascular congestion with small pleural effusions bilaterally. Electronically Signed   By: Bretta Bang III M.D.   On: 08/07/2017 07:36   Dg Chest Port 1 View  Result Date: 08/05/2017 CLINICAL DATA:  Acute respiratory failure, COPD, hypertension, smoker EXAM: PORTABLE CHEST 1 VIEW COMPARISON:  Portable exam 0548 hours compared to 08/04/2017 FINDINGS: Tip of endotracheal tube projects 4.1 cm above carina. Nasogastric tube extends into stomach. LEFT subclavian central venous catheter tip projects over SVC. Numerous EKG leads project over chest. Enlargement of cardiac silhouette with pulmonary vascular congestion. Scattered interstitial infiltrates likely representing pulmonary edema. Decreased LEFT pleural effusion. LEFT lower lobe opacification could represent atelectasis or coexistent consolidation. No pneumothorax. IMPRESSION: Persistent pulmonary edema with decreased LEFT pleural effusion. LEFT lower lobe atelectasis versus consolidation. Electronically Signed   By: Ulyses Southward M.D.   On: 08/05/2017 08:11   Dg Chest Port 1 View  Result Date: 08/04/2017 CLINICAL DATA:  Respiratory failure. EXAM: PORTABLE CHEST 1 VIEW COMPARISON:  Yesterday. FINDINGS: Endotracheal tube in satisfactory position. Nasogastric  tube extending the stomach. Stable left subclavian catheter. Increased patchy opacity in the perihilar regions bilaterally. No significant change in patchy density interstitial  prominence elsewhere in the right lung. Increased left pleural fluid. Stable enlarged cardiac silhouette. No acute bony abnormality. IMPRESSION: 1. Mild worsening of changes of pulmonary edema. 2. Increased left pleural fluid. 3. Stable cardiomegaly. Electronically Signed   By: Beckie Salts M.D.   On: 08/04/2017 07:05   Dg Chest Port 1 View  Result Date: 08/03/2017 CLINICAL DATA:  Acute respiratory failure. EXAM: PORTABLE CHEST 1 VIEW COMPARISON:  Radiograph yesterday at 0456 hour.  Chest CT 07/31/2016 FINDINGS: Endotracheal tube tip at the thoracic inlet. Enteric tube in place, tip below the diaphragm. Left central line with tip in the proximal SVC. Unchanged cardiomegaly. Unchanged vascular congestion/pulmonary edema. Bibasilar opacities, left greater than right, unchanged. No pneumothorax. IMPRESSION: Unchanged appearance of the chest with cardiomegaly, pulmonary edema/vascular congestion, and bibasilar opacities, left greater than right. Electronically Signed   By: Rubye Oaks M.D.   On: 08/03/2017 01:48   Dg Chest Port 1 View  Result Date: 08/02/2017 CLINICAL DATA:  52 year old female with respiratory failure. Left lung pneumonia. EXAM: PORTABLE CHEST 1 VIEW COMPARISON:  08/01/2017, chest CT 07/31/2017, and earlier. FINDINGS: Portable AP semi upright view at 0456 hours. Endotracheal tube tip at the level the clavicles. Enteric tube courses to the abdomen, tip not included. Stable left IJ or subclavian central line. Substantially improved left lung ventilation since 07/31/2017, although residual moderate confluent left lung base opacity remains. Interval increasing opacity at the right lung base. No pneumothorax. Chronic but increased underlying bilateral pulmonary interstitial opacity compared to 2018, possibly mild vascular congestion. Small bilateral pleural effusions are suspected. Stable cardiac size and mediastinal contours. Cardiomegaly and pericardial effusion noted on chest CT and CTA this  month. Paucity of bowel gas in the upper abdomen. IMPRESSION: 1.  Stable lines and tubes. 2. Improved left lung multilobar pneumonia since 07/31/2017, but moderate residual at the lung base. 3. Worsening ventilation at the right lung base over that same time suspicious for right lung involvement now. 4. Stable cardiomegaly.  Mild vascular congestion/pulmonary edema. 5. Small bilateral pleural effusions suspected. Electronically Signed   By: Odessa Fleming M.D.   On: 08/02/2017 11:00   Dg Chest Port 1 View  Result Date: 08/01/2017 CLINICAL DATA:  Respiratory failure EXAM: PORTABLE CHEST 1 VIEW COMPARISON:  Chest radiograph and chest CT July 31, 2017 FINDINGS: Endotracheal tube tip is 3.7 cm above the carina. Central catheter tip is in the superior vena cava. No pneumothorax. There has been significant partial clearing on the left compared to 1 day prior. There has been the significant clearing of diffuse consolidation and effusion. There remains atelectatic change in the left base as well as small left effusion. There has been a clearing of consolidation from the right upper lobe. Currently the right lung is clear except for atelectasis in the left base. There is cardiomegaly with pulmonary venous hypertension. No adenopathy evident. No bone lesions. IMPRESSION: Tube and catheter positions as described without pneumothorax. Significant clearing at the throughout the left lung and right upper lobe. There remains atelectatic change in each lower lung zone as well as underlying pulmonary vascular congestion. There is felt to be small left residual pleural effusion. Electronically Signed   By: Bretta Bang III M.D.   On: 08/01/2017 07:56   Dg Chest Port 1 View  Result Date: 07/31/2017 CLINICAL DATA:  Intubation and central line placement. EXAM: PORTABLE  CHEST 1 VIEW COMPARISON:  Chest x-ray from same day at 0529. FINDINGS: Interval placement of an endotracheal tube with the tip approximately 1 cm above the level  of the carina. Enteric tube seen entering the stomach. Left subclavian central venous catheter with the tip at the cavoatrial junction. Stable cardiomegaly. Unchanged complete opacification of the left hemithorax due to large pleural effusion and left lung collapse. Increasing airspace disease within the right upper lobe. No acute osseous abnormality. IMPRESSION: 1. Interval placement of an endotracheal tube with the tip approximately 1 cm above the level of the carina. Recommend retraction 2-3 cm. 2. Appropriately positioned left subclavian central venous catheter. 3. Unchanged large left pleural effusion and left lung collapse. 4. Worsening airspace disease in the right upper lobe which could reflect edema or infection. Electronically Signed   By: Obie Dredge M.D.   On: 07/31/2017 10:51   Dg Chest Port 1 View  Result Date: 07/31/2017 CLINICAL DATA:  Acute respiratory failure EXAM: PORTABLE CHEST 1 VIEW COMPARISON:  07/30/2017 FINDINGS: Dense consolidation of the left hemithorax unchanged due to effusion and collapse. Progression of right lower lobe atelectasis/infiltrate. Diffuse airspace disease on the right may represent edema. IMPRESSION: Persistent collapse of the left lung Progressive right lower lobe atelectasis/infiltrate and small right effusion. Electronically Signed   By: Marlan Palau M.D.   On: 07/31/2017 07:26   Dg Abd Portable 1v  Result Date: 07/31/2017 CLINICAL DATA:  NG tube placement. EXAM: PORTABLE ABDOMEN - 1 VIEW COMPARISON:  None. FINDINGS: Enteric tube in place with the tip in the distal gastric body. Nonobstructive bowel gas pattern. No acute osseous abnormality. IMPRESSION: Enteric tube appropriately positioned with the tip in the distal gastric body. Electronically Signed   By: Obie Dredge M.D.   On: 07/31/2017 10:52     CBC Recent Labs  Lab 08/10/17 0505 08/11/17 0508 08/13/17 0449 08/14/17 0445 08/15/17 0425  WBC 13.6* 11.8* 11.8* 10.7 7.2  HGB 16.5* 15.9  15.2 13.9 13.0  HCT 52.7* 53.6* 49.5* 46.3 43.1  PLT 359 337 293 261 247  MCV 78.6* 79.8* 79.8* 81.0 79.3*  MCH 24.7* 23.6* 24.4* 24.3* 24.0*  MCHC 31.4* 29.6* 30.6* 30.0* 30.2*  RDW 22.0* 21.9* 21.9* 22.1* 21.5*  LYMPHSABS  --  1.5 1.6 1.6 1.3  MONOABS  --  1.4* 1.0* 0.9 0.5  EOSABS  --  0.1 0.1 0.1 0.3  BASOSABS  --  0.1 0.0 0.0 0.0    Chemistries  Recent Labs  Lab 08/10/17 0505 08/11/17 0508 08/13/17 0449 08/14/17 0445 08/15/17 0425  NA 143 148* 150* 147* 145  K 4.4 3.9 3.8 3.6 3.4*  CL 98* 102 103 106 106  CO2 34* 34* 30 32 32  GLUCOSE 148* 145* 163* 120* 106*  BUN 70* 52* 42* 33* 18  CREATININE 0.80 0.67 0.80 0.65 0.48  CALCIUM 10.8* 10.4* 9.4 9.2 9.2  MG  --  2.0 1.9 1.9 1.6*  AST  --   --  19  --   --   ALT  --   --  34  --   --   ALKPHOS  --   --  68  --   --   BILITOT  --   --  1.8*  --   --    ------------------------------------------------------------------------------------------------------------------ estimated creatinine clearance is 90.5 mL/min (by C-G formula based on SCr of 0.48 mg/dL). ------------------------------------------------------------------------------------------------------------------ No results for input(s): HGBA1C in the last 72 hours. ------------------------------------------------------------------------------------------------------------------ No results for input(s): CHOL, HDL, LDLCALC,  TRIG, CHOLHDL, LDLDIRECT in the last 72 hours. ------------------------------------------------------------------------------------------------------------------ No results for input(s): TSH, T4TOTAL, T3FREE, THYROIDAB in the last 72 hours.  Invalid input(s): FREET3 ------------------------------------------------------------------------------------------------------------------ No results for input(s): VITAMINB12, FOLATE, FERRITIN, TIBC, IRON, RETICCTPCT in the last 72 hours.  Coagulation profile Recent Labs  Lab 08/14/17 0445  INR 1.21     No results for input(s): DDIMER in the last 72 hours.  Cardiac Enzymes No results for input(s): CKMB, TROPONINI, MYOGLOBIN in the last 168 hours.  Invalid input(s): CK ------------------------------------------------------------------------------------------------------------------ Invalid input(s): POCBNP    Assessment & Plan  52 year old female patient currently in ICU for acute on chronic respiratory failure, pericardial effusion and heart failure exacerbation.  1. Acute on chronic respiratory failure with hypoxia Status post extubation by ENT in the OR yesterday,  Transferred out of ICU today.  Patient is on Unasyn, Diflucan.,  Continue bronchodilators 2. Acute on chronic diastolic heart failure Lasix as needed for diuresis, appreciate cardiology following regarding pericardial effusion also.  Patient has no tampon by echocardiogram.    3.  Left lung pneumonia versus pericarditis  Opacification of left hemithorax secondary to atelectasis and effusion  4.Pericardial effusion with no evidence of pericardial tamponade Cardiology follow-up appreciated, patient is on small dose Lasix  5.. Started on dysphagia 3 diet with thin liquids, speech therapy is following to make further recommendations. 6.  DVT prophylaxis with subcu heparin 5000 units every 8 hourly   hypernatremia, improved.  Stopped IV fluids with D5 DC Foley, physical therapy evaluation in preparation for discharge .    Code Status Orders  (From admission, onward)        Start     Ordered   07/29/17 2224  Full code  Continuous     07/29/17 2223    Code Status History    Date Active Date Inactive Code Status Order ID Comments User Context   06/07/2017 1347 06/12/2017 2013 Full Code [id number]  Adrian Saran, MD Inpatient      Time Spent in minutes   35 minutes  Greater than 50% of time spent in care coordination and counseling patient regarding the condition and plan of care.   Katha Hamming M.D  on 08/15/2017 at 12:52 PM  Between 7am to 6pm - Pager - [phone]  After 6pm go to [email or web address] - Social research officer, government  [employer]   Office  [phone]

## 2017-08-15 NOTE — Progress Notes (Signed)
SUBJECTIVE: Pt is hoarse but tolerating nasal cannula well. No chest pain.   Vitals:   08/15/17 0300 08/15/17 0400 08/15/17 0421 08/15/17 0500  BP: 114/76 101/72  121/78  Pulse: 71 74    Resp: 15 15  17   Temp:      TempSrc:      SpO2: 93% 93%    Weight:   238 lb 1.6 oz (108 kg)   Height:        Intake/Output Summary (Last 24 hours) at 08/15/2017 0849 Last data filed at 08/15/2017 0536 Gross per 24 hour  Intake 1170.34 ml  Output 905 ml  Net 265.34 ml    LABS: Basic Metabolic Panel: Recent Labs    08/14/17 0445 08/15/17 0425  NA 147* 145  K 3.6 3.4*  CL 106 106  CO2 32 32  GLUCOSE 120* 106*  BUN 33* 18  CREATININE 0.65 0.48  CALCIUM 9.2 9.2  MG 1.9 1.6*  PHOS 3.2 3.9   Liver Function Tests: Recent Labs    08/13/17 0449  AST 19  ALT 34  ALKPHOS 68  BILITOT 1.8*  PROT 7.7  ALBUMIN 3.5   No results for input(s): LIPASE, AMYLASE in the last 72 hours. CBC: Recent Labs    08/14/17 0445 08/15/17 0425  WBC 10.7 7.2  NEUTROABS 8.1* 5.1  HGB 13.9 13.0  HCT 46.3 43.1  MCV 81.0 79.3*  PLT 261 247   Cardiac Enzymes: No results for input(s): CKTOTAL, CKMB, CKMBINDEX, TROPONINI in the last 72 hours. BNP: Invalid input(s): POCBNP D-Dimer: No results for input(s): DDIMER in the last 72 hours. Hemoglobin A1C: No results for input(s): HGBA1C in the last 72 hours. Fasting Lipid Panel: No results for input(s): CHOL, HDL, LDLCALC, TRIG, CHOLHDL, LDLDIRECT in the last 72 hours. Thyroid Function Tests: No results for input(s): TSH, T4TOTAL, T3FREE, THYROIDAB in the last 72 hours.  Invalid input(s): FREET3 Anemia Panel: No results for input(s): VITAMINB12, FOLATE, FERRITIN, TIBC, IRON, RETICCTPCT in the last 72 hours.   PHYSICAL EXAM General: Well developed, well nourished, in no acute distress, Hoarse HEENT:  Normocephalic and atramatic Neck:  No JVD.  Lungs: Clear bilaterally to auscultation and percussion. Heart: HRRR . Normal S1 and S2 without gallops or  murmurs.  Abdomen: Bowel sounds are positive, abdomen soft and non-tender  Msk:  Back normal, normal gait. Normal strength and tone for age. Extremities: No clubbing, cyanosis or edema.   Neuro: Alert and oriented X 3. Psych:  Good affect, responds appropriately  TELEMETRY: NSR 70bpm  ASSESSMENT AND PLAN:  Acute respiratory failure secondary to COPD/CHF exacerbation: Successfully extubated and tolerating nasal cannula. History of small pericardial effusion without tamponade. Cardiac status stable. Advise adding loop diuretics to improve cardiac function and lung compliance.    Principal Problem:   Acute on chronic respiratory failure with hypoxia (HCC) Active Problems:   Acute respiratory failure (HCC)   Pericardial effusion    Caroleen HammanKristin Tymeshia Awan, NP-C 08/15/2017 8:49 AM Cell: [phone]

## 2017-08-16 ENCOUNTER — Inpatient Hospital Stay: Payer: Medicaid Other

## 2017-08-16 LAB — CBC WITH DIFFERENTIAL/PLATELET
BASOS PCT: 0 %
Basophils Absolute: 0 10*3/uL (ref 0–0.1)
EOS ABS: 0.2 10*3/uL (ref 0–0.7)
EOS PCT: 3 %
HCT: 42.4 % (ref 35.0–47.0)
HEMOGLOBIN: 13 g/dL (ref 12.0–16.0)
LYMPHS ABS: 1.1 10*3/uL (ref 1.0–3.6)
Lymphocytes Relative: 17 %
MCH: 24.2 pg — AB (ref 26.0–34.0)
MCHC: 30.6 g/dL — AB (ref 32.0–36.0)
MCV: 79.3 fL — ABNORMAL LOW (ref 80.0–100.0)
Monocytes Absolute: 0.4 10*3/uL (ref 0.2–0.9)
Monocytes Relative: 6 %
NEUTROS PCT: 74 %
Neutro Abs: 5 10*3/uL (ref 1.4–6.5)
PLATELETS: 231 10*3/uL (ref 150–440)
RBC: 5.35 MIL/uL — ABNORMAL HIGH (ref 3.80–5.20)
RDW: 21.5 % — ABNORMAL HIGH (ref 11.5–14.5)
WBC: 6.7 10*3/uL (ref 3.6–11.0)

## 2017-08-16 LAB — BASIC METABOLIC PANEL
Anion gap: 6 (ref 5–15)
BUN: 10 mg/dL (ref 6–20)
CALCIUM: 9 mg/dL (ref 8.9–10.3)
CO2: 32 mmol/L (ref 22–32)
CREATININE: 0.48 mg/dL (ref 0.44–1.00)
Chloride: 101 mmol/L (ref 101–111)
Glucose, Bld: 157 mg/dL — ABNORMAL HIGH (ref 65–99)
Potassium: 3.1 mmol/L — ABNORMAL LOW (ref 3.5–5.1)
SODIUM: 139 mmol/L (ref 135–145)

## 2017-08-16 LAB — CULTURE, RESPIRATORY W GRAM STAIN: Culture: NORMAL

## 2017-08-16 LAB — GLUCOSE, CAPILLARY
Glucose-Capillary: 104 mg/dL — ABNORMAL HIGH (ref 65–99)
Glucose-Capillary: 105 mg/dL — ABNORMAL HIGH (ref 65–99)
Glucose-Capillary: 109 mg/dL — ABNORMAL HIGH (ref 65–99)
Glucose-Capillary: 116 mg/dL — ABNORMAL HIGH (ref 65–99)
Glucose-Capillary: 117 mg/dL — ABNORMAL HIGH (ref 65–99)
Glucose-Capillary: 94 mg/dL (ref 65–99)

## 2017-08-16 LAB — CULTURE, RESPIRATORY

## 2017-08-16 LAB — CALCIUM, IONIZED: Calcium, Ionized, Serum: 5.2 mg/dL (ref 4.5–5.6)

## 2017-08-16 LAB — PHOSPHORUS: Phosphorus: 3.5 mg/dL (ref 2.5–4.6)

## 2017-08-16 LAB — MAGNESIUM: MAGNESIUM: 1.6 mg/dL — AB (ref 1.7–2.4)

## 2017-08-16 MED ORDER — IPRATROPIUM-ALBUTEROL 0.5-2.5 (3) MG/3ML IN SOLN
3.0000 mL | Freq: Four times a day (QID) | RESPIRATORY_TRACT | Status: DC
  Administered 2017-08-17 (×2): 3 mL via RESPIRATORY_TRACT
  Filled 2017-08-16 (×2): qty 3

## 2017-08-16 MED ORDER — POTASSIUM CHLORIDE CRYS ER 20 MEQ PO TBCR
40.0000 meq | EXTENDED_RELEASE_TABLET | ORAL | Status: AC
  Administered 2017-08-16 (×2): 40 meq via ORAL
  Filled 2017-08-16 (×2): qty 2

## 2017-08-16 MED ORDER — ENOXAPARIN SODIUM 40 MG/0.4ML ~~LOC~~ SOLN
40.0000 mg | SUBCUTANEOUS | Status: DC
  Administered 2017-08-16: 40 mg via SUBCUTANEOUS
  Filled 2017-08-16: qty 0.4

## 2017-08-16 MED ORDER — POTASSIUM CHLORIDE CRYS ER 20 MEQ PO TBCR: 40.0000 meq | EXTENDED_RELEASE_TABLET | ORAL | Status: DC

## 2017-08-16 MED ORDER — MAGNESIUM SULFATE 2 GM/50ML IV SOLN
2.0000 g | Freq: Once | INTRAVENOUS | Status: AC
  Administered 2017-08-16: 2 g via INTRAVENOUS
  Filled 2017-08-16: qty 50

## 2017-08-16 NOTE — Progress Notes (Addendum)
[employer] - [place] at [facility]                                                                                                                                                                                  Patient Demographics   [name], is a 52 y.o. female, DOB - 06-29-1965, ZOX:[id number]  Admit date - 07/29/2017   Admitting Physician Altamese Dilling, MD  Outpatient Primary MD for the patient is [name], Nadyne Coombes, MD   LOS - 18  Subjective: Patient seen in IC today, she says  she had diarrhea 3 times last night.  No abdominal pain.  Discontinue stool softeners.  Physical therapy recommended CIR.  We will discontinue Foley catheter, patient refused to removing the Foley yesterday.  Review of Systems:     Vitals:   Vitals:   08/15/17 1946 08/16/17 0411 08/16/17 0500 08/16/17 0718  BP:  107/68    Pulse:  83    Resp:  20    Temp:  98.6 F (37 C)    TempSrc:  Oral    SpO2: 93% 95%  94%  Weight:   105.2 kg (232 lb)   Height:        Wt Readings from Last 3 Encounters:  08/16/17 105.2 kg (232 lb)  06/12/17 119 kg (262 lb 6.4 oz)  09/29/16 117.9 kg (260 lb)     Intake/Output Summary (Last 24 hours) at 08/16/2017 0949 Last data filed at 08/16/2017 0836 Gross per 24 hour  Intake 420 ml  Output 525 ml  Net -105 ml    Physical Exam:   GENERAL: 51 year old female in no distress. FiO2 40% PEEP 5pressure support 10 HEAD, EYES, EARS, NOSE AND THROAT: Atraumatic, normocephalic. Extraocular muscles are intact. Pupils equal and reactive to light. Sclerae anicteric. No conjunctival injection.  NECK: Supple. There is no jugular venous distention. No bruits, no lymphadenopathy, no thyromegaly.  HEART: Regular rate and rhythm,. No murmurs, no rubs, no clicks.  LUNGS: Clear to auscultation, no wheezing.  ABDOMEN: Soft, flat, nontender, nondistended. Has good bowel sounds. No hepatosplenomegaly appreciated.  EXTREMITIES: No evidence of any cyanosis,  clubbing, or peripheral edema.  +2 pedal and radial pulses bilaterally.  NEUROLOGIC: The patient is alert, awake  On ventilator SKIN: Moist and warm with no rashes appreciated.  Psych: could not be assessed LN: No inguinal LN enlargement    Antibiotics   Anti-infectives (From admission, onward)   Start     Dose/Rate Route Frequency Ordered Stop   08/15/17 1000  fluconazole (DIFLUCAN) tablet 100 mg     100 mg Oral Daily 08/14/17 2106     08/13/17 2300  vancomycin (VANCOCIN) IVPB  1000 mg/200 mL premix  Status:  Discontinued     1,000 mg 200 mL/hr over 60 Minutes Intravenous Every 12 hours 08/13/17 1920 08/15/17 1135   08/13/17 1845  vancomycin (VANCOCIN) IVPB 1000 mg/200 mL premix     1,000 mg 200 mL/hr over 60 Minutes Intravenous  Once 08/13/17 1831 08/13/17 1950   08/13/17 1400  Ampicillin-Sulbactam (UNASYN) 3 g in sodium chloride 0.9 % 100 mL IVPB  Status:  Discontinued     3 g 200 mL/hr over 30 Minutes Intravenous Every 6 hours 08/13/17 1253 08/15/17 1135   08/13/17 1000  fluconazole (DIFLUCAN) tablet 100 mg  Status:  Discontinued     100 mg Per Tube Daily 08/12/17 1134 08/14/17 2106   08/12/17 1145  fluconazole (DIFLUCAN) IVPB 200 mg     200 mg 100 mL/hr over 60 Minutes Intravenous  Once 08/12/17 1134 08/12/17 1342   08/10/17 1600  piperacillin-tazobactam (ZOSYN) IVPB 3.375 g  Status:  Discontinued     3.375 g 12.5 mL/hr over 240 Minutes Intravenous Every 8 hours 08/10/17 1550 08/13/17 1050   07/31/17 0800  ceFEPIme (MAXIPIME) 2 g in sodium chloride 0.9 % 100 mL IVPB     2 g 200 mL/hr over 30 Minutes Intravenous Every 8 hours 07/31/17 0627 08/07/17 0030   07/29/17 1915  ceFEPIme (MAXIPIME) 2 g in sodium chloride 0.9 % 100 mL IVPB     2 g 200 mL/hr over 30 Minutes Intravenous  Once 07/29/17 1911 07/29/17 2050   07/29/17 1915  vancomycin (VANCOCIN) IVPB 1000 mg/200 mL premix     1,000 mg 200 mL/hr over 60 Minutes Intravenous  Once 07/29/17 1911 07/29/17 2342       Medications   Scheduled Meds: . budesonide (PULMICORT) nebulizer solution  0.5 mg Nebulization BID  . famotidine  20 mg Oral BID  . fluconazole  100 mg Oral Daily  . furosemide  20 mg Oral Daily  . insulin aspart  0-15 Units Subcutaneous Q4H  . ipratropium-albuterol  3 mL Nebulization Q4H  . mouth rinse  15 mL Mouth Rinse BID  . metoCLOPramide (REGLAN) injection  5 mg Intravenous Q12H  . protein supplement shake  11 oz Oral BID BM  . sertraline  50 mg Oral QHS  . sodium chloride flush  10-40 mL Intracatheter Q12H   Continuous Infusions:  PRN Meds:.acetaminophen (TYLENOL) oral liquid 160 mg/5 mL, albuterol, fentaNYL, LORazepam, ondansetron (ZOFRAN) IV, sodium chloride flush   Data Review:   Micro Results Recent Results (from the past 240 hour(s))  Culture, respiratory (NON-Expectorated)     Status: None   Collection Time: 08/10/17  2:32 PM  Result Value Ref Range Status   Specimen Description TRACHEAL ASPIRATE  Final   Special Requests   Final    NONE Performed at [facility], [address] [place], [place] [address]    Gram Stain   Final    ABUNDANT WBC PRESENT, PREDOMINANTLY PMN RARE SQUAMOUS EPITHELIAL CELLS PRESENT MODERATE GRAM POSITIVE COCCI IN CHAINS    Culture   Final    FEW Consistent with normal respiratory flora. Performed at [facility], [address], [place], [place] [address]    Report Status 08/12/2017 FINAL  Final  Culture, respiratory (NON-Expectorated)     Status: None   Collection Time: 08/13/17  6:30 PM  Result Value Ref Range Status   Specimen Description   Final    TRACHEAL ASPIRATE Performed at [facility], [address] [place],  [place] [address]    Special Requests   Final    NONE Performed at [facility], [address] [place], [place] [address]    Gram Stain   Final    RARE WBC PRESENT, PREDOMINANTLY PMN NO ORGANISMS SEEN    Culture   Final    Consistent with normal respiratory  flora. Performed at [facility], [address], [place], [place] [address]    Report Status 08/16/2017 FINAL  Final  CULTURE, BLOOD (ROUTINE X 2) w Reflex to ID Panel     Status: None (Preliminary result)   Collection Time: 08/13/17  7:06 PM  Result Value Ref Range Status   Specimen Description BLOOD BLOOD RIGHT HAND  Final   Special Requests   Final    BOTTLES DRAWN AEROBIC AND ANAEROBIC Blood Culture results may not be optimal due to an inadequate volume of blood received in culture bottles   Culture   Final    NO GROWTH 3 DAYS Performed at [facility], [address] [place], [place] [address]    Report Status PENDING  Incomplete  CULTURE, BLOOD (ROUTINE X 2) w Reflex to ID Panel     Status: None (Preliminary result)   Collection Time: 08/13/17  7:13 PM  Result Value Ref Range Status   Specimen Description BLOOD BLOOD LEFT HAND  Final   Special Requests   Final    BOTTLES DRAWN AEROBIC AND ANAEROBIC Blood Culture adequate volume   Culture   Final    NO GROWTH 3 DAYS Performed at [facility], [address] [place], [place] [address]    Report Status PENDING  Incomplete    Radiology Reports Dg Chest 2 View  Result Date: 07/30/2017 CLINICAL DATA:  Shortness of breath. Acute on chronic respiratory failure. Hypoxia. Morbid obesity. EXAM: CHEST - 2 VIEW COMPARISON:  Chest x-ray and CT chest 07/29/2017. FINDINGS: Cardiac silhouette is partially silhouetted by large pleural effusion on the LEFT. There is a whiteout of the LEFT hemithorax, which could represent mucous plugging and or increasing effusion. The RIGHT lung demonstrates interstitial edema with small effusion. IMPRESSION: Marked worsening aeration. Complete opacity of the LEFT hemithorax represents a significant change from yesterday's radiograph. Electronically Signed   By: Elsie Stain M.D.   On: 07/30/2017 08:45   Dg Chest 2 View  Result Date: 07/29/2017 CLINICAL DATA:  52 year old female  with history of chest pain since last week. Pain most severe during deep breathing. EXAM: CHEST - 2 VIEW COMPARISON:  Chest x-ray 06/10/2017. FINDINGS: Left lower lobe airspace consolidation concerning for pneumonia. Small bilateral pleural effusions. Mild diffuse peribronchial cuffing. Mild cardiomegaly. Upper mediastinal contours appear widened, but are stable compared to prior examinations. IMPRESSION: 1. Left lower lobe pneumonia with small bilateral pleural effusions. Followup PA and lateral chest X-ray is recommended in 3-4 weeks following trial of antibiotic therapy to ensure resolution and exclude underlying malignancy. 2. Mild cardiomegaly. Electronically Signed   By: Trudie Reed M.D.   On: 07/29/2017 17:37   Dg Abd 1 View  Result Date: 08/12/2017 CLINICAL DATA:  Dyspepsia.  Difficulty feeding. EXAM: ABDOMEN - 1 VIEW COMPARISON:  08/07/2017 FINDINGS: The enteric tube tip projects over the expected location of the distal stomach. Side port is well below GE junction. Cholecystectomy clips noted. No abnormal bowel dilatation identified. IMPRESSION: 1. NG tube tip within the body of stomach. 2. No abnormal bowel dilatation noted. Electronically Signed   By: Veronda Prude.D.  On: 08/12/2017 10:40   Dg Abd 1 View  Result Date: 08/07/2017 CLINICAL DATA:  Fecal impaction. EXAM: ABDOMEN - 1 VIEW COMPARISON:  07/31/2017 FINDINGS: Nasogastric tube has its tip in the midportion of the stomach. Clips in the right upper quadrant consistent with previous cholecystectomy. Single clip in the right lower quadrant, possibly appendectomy. Moderate amount of fecal matter within the right colon. No sign of obstruction. IMPRESSION: Nasogastric tube in the mid stomach. Moderate amount of fecal matter in the right colon. No sign of obstruction. Electronically Signed   By: Paulina Fusi M.D.   On: 08/07/2017 11:32   Ct Chest Wo Contrast  Result Date: 07/31/2017 CLINICAL DATA:  She presented to [facility] ER on 03/17  with c/o intermittent chest pain worse during inspiration and nonproductive cough onset 1 week prior to presentation. On 03/18 pt developed hypotension and worsening acute hypoxic respiratory failure. EXAM: CT CHEST WITHOUT CONTRAST TECHNIQUE: Multidetector CT imaging of the chest was performed following the standard protocol without IV contrast. COMPARISON:  07/29/2017, 06/10/2017 FINDINGS: Cardiovascular: Stable cardiomegaly. Small pericardial effusion. Dilatation of main pulmonary artery as well as the right and left main pulmonary arteries as can be seen with pulmonary arterial hypertension. Thoracic aorta is normal in caliber. Mediastinum/Nodes: Enlarged prevascular lymph node measuring 17 mm in short axis. Multiple other smaller mediastinal lymph nodes are noted. Thyroid gland, trachea, and esophagus demonstrate no significant findings. Lungs/Pleura: Trace right pleural effusion. Small left pleural effusion. Left lower lobe airspace disease with air bronchograms concerning for pneumonia. Improved aeration of left upper lobe. Mild persistent right upper lobe airspace disease likely reflecting atelectasis. Stable 6 mm right middle lobe pulmonary nodule unchanged compared with 09/01/2013. Upper Abdomen: Aortic atherosclerosis. Musculoskeletal: No acute osseous abnormality. No aggressive osseous lesion. IMPRESSION: 1. Left lower lobe airspace disease with air bronchograms concerning for combination of pneumonia and atelectasis. Mild mediastinal lymphadenopathy likely reactive. 2. Small left pleural effusion.  Mild right upper lobe atelectasis. 3.  Aortic Atherosclerosis (ICD10-I70.0). Electronically Signed   By: Elige Ko   On: 07/31/2017 12:47   Ct Angio Chest Pe W And/or Wo Contrast  Result Date: 07/29/2017 CLINICAL DATA:  52 year old female with history of chest pain since last week. Difficulty taking deep breaths. EXAM: CT ANGIOGRAPHY CHEST WITH CONTRAST TECHNIQUE: Multidetector CT imaging of the chest  was performed using the standard protocol during bolus administration of intravenous contrast. Multiplanar CT image reconstructions and MIPs were obtained to evaluate the vascular anatomy. CONTRAST:  75mL ISOVUE-370 IOPAMIDOL (ISOVUE-370) INJECTION 76% COMPARISON:  Chest CT 06/10/2017. FINDINGS: Cardiovascular: No filling defects within the pulmonary arterial tree to suggest underlying pulmonary embolism. Heart size is normal. Moderate volume of pericardial fluid with pericardial enhancement concerning for pericarditis. No pericardial calcification. There is aortic atherosclerosis, as well as atherosclerosis of the great vessels of the mediastinum and the coronary arteries, including calcified atherosclerotic plaque in the left anterior descending coronary artery. Mediastinum/Nodes: Multiple prominent borderline enlarged and enlarged mediastinal lymph nodes, largest of which is in the prevascular nodal station measuring up to 22 mm in short axis, increased compared to the prior study. Esophagus is unremarkable in appearance. No axillary lymphadenopathy. Lungs/Pleura: Moderate left pleural effusion with near complete passive atelectasis of the left lower lobe. Some dependent subsegmental atelectasis is also noted in the left upper lobe. Trace right pleural effusion. Throughout the remaining portions of the lungs there is a background of ground-glass attenuation and mild interlobular septal thickening, indicative of a background of mild interstitial pulmonary edema.  5 mm right middle lobe pulmonary nodule (axial image 47 of series 7), stable dating back to at least 09/01/2013, considered definitively benign. Upper Abdomen: Aortic atherosclerosis. Musculoskeletal: There are no aggressive appearing lytic or blastic lesions noted in the visualized portions of the skeleton. Review of the MIP images confirms the above findings. IMPRESSION: 1. No evidence of pulmonary embolism. 2. Extensive pericardial enhancement with  moderate volume of pericardial fluid, indicative of an acute pericarditis. 3. This is associated with increasing mediastinal lymphadenopathy compared to the prior study. 4. In addition, there is evidence of mild pulmonary edema and bilateral pleural effusions (left greater than right) with extensive areas of passive atelectasis in the left lung. 5. Aortic atherosclerosis, in addition to left anterior descending coronary artery disease. Please note that although the presence of coronary artery calcium documents the presence of coronary artery disease, the severity of this disease and any potential stenosis cannot be assessed on this non-gated CT examination. Assessment for potential risk factor modification, dietary therapy or pharmacologic therapy may be warranted, if clinically indicated. Aortic Atherosclerosis (ICD10-I70.0). Electronically Signed   By: Trudie Reed M.D.   On: 07/29/2017 19:45   Dg Chest Port 1 View  Result Date: 08/16/2017 CLINICAL DATA:  Atelectasis EXAM: PORTABLE CHEST 1 VIEW COMPARISON:  08/15/2017 FINDINGS: Left subclavian central venous catheter tip in the SVC unchanged. No pneumothorax. Bibasilar airspace disease with mild progression.  No effusion. IMPRESSION: Progression of bibasilar atelectasis/infiltrate. Electronically Signed   By: Marlan Palau M.D.   On: 08/16/2017 07:34   Dg Chest Port 1 View  Result Date: 08/15/2017 CLINICAL DATA:  Status postextubation EXAM: PORTABLE CHEST 1 VIEW COMPARISON:  August 14, 2017 FINDINGS: Endotracheal tube and nasogastric tube have been removed. Central catheter tip is in superior vena cava. No pneumothorax. There is persistent cardiomegaly with pulmonary vascularity within normal limits. There is trace interstitial edema. There is no appreciable consolidation. No new opacity. No adenopathy. No bone lesions. IMPRESSION: Central catheter tip in superior vena cava without pneumothorax. Cardiomegaly with trace interstitial edema. No  consolidation. No new opacity. Electronically Signed   By: Bretta Bang III M.D.   On: 08/15/2017 07:38   Dg Chest Port 1 View  Result Date: 08/14/2017 CLINICAL DATA:  Pneumonia EXAM: PORTABLE CHEST 1 VIEW COMPARISON:  08/13/2017, 08/11/2017, 08/10/2017 FINDINGS: Endotracheal tube tip is about 2.6 cm superior to the carina. Left-sided central venous catheter tip overlies the SVC. Esophageal tube tip is below the diaphragm but non included. Cardiomegaly with vascular congestion and mild diffuse pulmonary edema. Superimposed airspace disease at the bases, no significant change. Negative for pneumothorax. IMPRESSION: 1. Cardiomegaly with vascular congestion and mild edema, no significant change 2. Superimposed airspace disease at the bilateral lung bases, atelectasis versus pneumonia, also without significant interval change compared to most recent prior. Electronically Signed   By: Jasmine Pang M.D.   On: 08/14/2017 03:04   Dg Chest Port 1 View  Result Date: 08/13/2017 CLINICAL DATA:  Pneumonia EXAM: PORTABLE CHEST 1 VIEW COMPARISON:  08/11/2017 FINDINGS: Endotracheal tube in good position. Central venous catheter tip SVC. NG tube in the stomach. Bilateral airspace disease unchanged, most prominent in the lung bases. Small effusions bilaterally. IMPRESSION: Endotracheal tube and central line unchanged in position Diffuse bilateral airspace disease basilar predominant unchanged. CHF versus pneumonia. Electronically Signed   By: Marlan Palau M.D.   On: 08/13/2017 07:30   Dg Chest Port 1 View  Result Date: 08/11/2017 CLINICAL DATA:  Pneumonia EXAM: PORTABLE CHEST 1 VIEW  COMPARISON:  Yesterday FINDINGS: Endotracheal tube tip at the clavicular heads. Left subclavian central line with tip at the distal SVC. An orogastric tube reaches the stomach. Indistinct opacities at the lung bases. Generalized interstitial coarsening that is stable. Unchanged cardiopericardial enlargement. IMPRESSION: 1. Stable  positioning of tubes and central line. 2. History of pneumonia with unchanged lung opacity. 3. Cardiopericardial enlargement. Pericardial effusion by recent chest CT. Electronically Signed   By: Marnee Spring M.D.   On: 08/11/2017 07:45   Dg Chest Port 1 View  Result Date: 08/10/2017 CLINICAL DATA:  Rhonchi, acute on chronic respiratory failure with hypoxia, heart failure, pericardial effusion, pneumonia versus pericarditis, COPD EXAM: PORTABLE CHEST 1 VIEW COMPARISON:  Portable exam 1445 hours compared to 08/08/2017 FINDINGS: Tip of endotracheal tube projects 3.0 cm above carina. Nasogastric tube extends into stomach. LEFT subclavian central venous catheter tip projects over SVC. Enlargement of cardiac silhouette with vascular congestion. Increased RIGHT lung infiltrate. Questionable LEFT lower lobe atelectasis versus consolidation. No gross pleural effusion or pneumothorax. IMPRESSION: Increased interstitial infiltrates in RIGHT lung with questionable atelectasis versus infiltrate in LEFT lower lobe. Enlargement of cardiac silhouette with pulmonary vascular congestion. Sign report Electronically Signed   By: Ulyses Southward M.D.   On: 08/10/2017 15:08   Dg Chest Port 1 View  Result Date: 08/08/2017 CLINICAL DATA:  Acute respiratory distress. History of acute on chronic respiratory failure, pericardial effusion, COPD, current smoker. EXAM: PORTABLE CHEST 1 VIEW COMPARISON:  Portable chest x-ray of August 07, 2017 FINDINGS: The lungs are well-expanded. Persistent bibasilar interstitial and airspace opacities are present. The cardiac silhouette is enlarged. The pulmonary vascularity is mildly congested. The endotracheal tube tip lies 3.3 cm above the carina. The esophagogastric tube tip projects below the inferior margin of the image. The left internal jugular venous catheter tip projects over the junction of the proximal and midportions of the SVC. IMPRESSION: Bibasilar atelectasis or pneumonia. Mild CHF. The  support tubes are in reasonable position. Electronically Signed   By: David  [place] M.D.   On: 08/08/2017 09:39   Dg Chest Port 1 View  Result Date: 08/07/2017 CLINICAL DATA:  Hypoxia EXAM: PORTABLE CHEST 1 VIEW COMPARISON:  August 05, 2017 FINDINGS: Endotracheal tube tip is 4.0 cm above the carina. Central catheter tip is in superior vena cava. Nasogastric tube tip and side port are below the diaphragm. No pneumothorax. There are small pleural effusions bilaterally. There is consolidation in the left lower lobe as well as to a lesser extent in the right base. Heart is enlarged with pulmonary venous hypertension. No adenopathy. No bone lesions. IMPRESSION: Tube and catheter positions as described without pneumothorax. Bibasilar consolidation, more on the left than on the right, likely multifocal pneumonia. A degree of alveolar edema in the bases is possible. There is underlying pulmonary vascular congestion with small pleural effusions bilaterally. Electronically Signed   By: Bretta Bang III M.D.   On: 08/07/2017 07:36   Dg Chest Port 1 View  Result Date: 08/05/2017 CLINICAL DATA:  Acute respiratory failure, COPD, hypertension, smoker EXAM: PORTABLE CHEST 1 VIEW COMPARISON:  Portable exam 0548 hours compared to 08/04/2017 FINDINGS: Tip of endotracheal tube projects 4.1 cm above carina. Nasogastric tube extends into stomach. LEFT subclavian central venous catheter tip projects over SVC. Numerous EKG leads project over chest. Enlargement of cardiac silhouette with pulmonary vascular congestion. Scattered interstitial infiltrates likely representing pulmonary edema. Decreased LEFT pleural effusion. LEFT lower lobe opacification could represent atelectasis or coexistent consolidation. No pneumothorax. IMPRESSION: Persistent pulmonary  edema with decreased LEFT pleural effusion. LEFT lower lobe atelectasis versus consolidation. Electronically Signed   By: Ulyses SouthwardMark  Boles M.D.   On: 08/05/2017 08:11   Dg Chest  Port 1 View  Result Date: 08/04/2017 CLINICAL DATA:  Respiratory failure. EXAM: PORTABLE CHEST 1 VIEW COMPARISON:  Yesterday. FINDINGS: Endotracheal tube in satisfactory position. Nasogastric tube extending the stomach. Stable left subclavian catheter. Increased patchy opacity in the perihilar regions bilaterally. No significant change in patchy density interstitial prominence elsewhere in the right lung. Increased left pleural fluid. Stable enlarged cardiac silhouette. No acute bony abnormality. IMPRESSION: 1. Mild worsening of changes of pulmonary edema. 2. Increased left pleural fluid. 3. Stable cardiomegaly. Electronically Signed   By: Beckie SaltsSteven  Reid M.D.   On: 08/04/2017 07:05   Dg Chest Port 1 View  Result Date: 08/03/2017 CLINICAL DATA:  Acute respiratory failure. EXAM: PORTABLE CHEST 1 VIEW COMPARISON:  Radiograph yesterday at 0456 hour.  Chest CT 07/31/2016 FINDINGS: Endotracheal tube tip at the thoracic inlet. Enteric tube in place, tip below the diaphragm. Left central line with tip in the proximal SVC. Unchanged cardiomegaly. Unchanged vascular congestion/pulmonary edema. Bibasilar opacities, left greater than right, unchanged. No pneumothorax. IMPRESSION: Unchanged appearance of the chest with cardiomegaly, pulmonary edema/vascular congestion, and bibasilar opacities, left greater than right. Electronically Signed   By: Rubye OaksMelanie  Ehinger M.D.   On: 08/03/2017 01:48   Dg Chest Port 1 View  Result Date: 08/02/2017 CLINICAL DATA:  52 year old female with respiratory failure. Left lung pneumonia. EXAM: PORTABLE CHEST 1 VIEW COMPARISON:  08/01/2017, chest CT 07/31/2017, and earlier. FINDINGS: Portable AP semi upright view at 0456 hours. Endotracheal tube tip at the level the clavicles. Enteric tube courses to the abdomen, tip not included. Stable left IJ or subclavian central line. Substantially improved left lung ventilation since 07/31/2017, although residual moderate confluent left lung base  opacity remains. Interval increasing opacity at the right lung base. No pneumothorax. Chronic but increased underlying bilateral pulmonary interstitial opacity compared to 2018, possibly mild vascular congestion. Small bilateral pleural effusions are suspected. Stable cardiac size and mediastinal contours. Cardiomegaly and pericardial effusion noted on chest CT and CTA this month. Paucity of bowel gas in the upper abdomen. IMPRESSION: 1.  Stable lines and tubes. 2. Improved left lung multilobar pneumonia since 07/31/2017, but moderate residual at the lung base. 3. Worsening ventilation at the right lung base over that same time suspicious for right lung involvement now. 4. Stable cardiomegaly.  Mild vascular congestion/pulmonary edema. 5. Small bilateral pleural effusions suspected. Electronically Signed   By: Odessa FlemingH  Hall M.D.   On: 08/02/2017 11:00   Dg Chest Port 1 View  Result Date: 08/01/2017 CLINICAL DATA:  Respiratory failure EXAM: PORTABLE CHEST 1 VIEW COMPARISON:  Chest radiograph and chest CT July 31, 2017 FINDINGS: Endotracheal tube tip is 3.7 cm above the carina. Central catheter tip is in the superior vena cava. No pneumothorax. There has been significant partial clearing on the left compared to 1 day prior. There has been the significant clearing of diffuse consolidation and effusion. There remains atelectatic change in the left base as well as small left effusion. There has been a clearing of consolidation from the right upper lobe. Currently the right lung is clear except for atelectasis in the left base. There is cardiomegaly with pulmonary venous hypertension. No adenopathy evident. No bone lesions. IMPRESSION: Tube and catheter positions as described without pneumothorax. Significant clearing at the throughout the left lung and right upper lobe. There remains atelectatic change in  each lower lung zone as well as underlying pulmonary vascular congestion. There is felt to be small left residual  pleural effusion. Electronically Signed   By: Bretta Bang III M.D.   On: 08/01/2017 07:56   Dg Chest Port 1 View  Result Date: 07/31/2017 CLINICAL DATA:  Intubation and central line placement. EXAM: PORTABLE CHEST 1 VIEW COMPARISON:  Chest x-ray from same day at 0529. FINDINGS: Interval placement of an endotracheal tube with the tip approximately 1 cm above the level of the carina. Enteric tube seen entering the stomach. Left subclavian central venous catheter with the tip at the cavoatrial junction. Stable cardiomegaly. Unchanged complete opacification of the left hemithorax due to large pleural effusion and left lung collapse. Increasing airspace disease within the right upper lobe. No acute osseous abnormality. IMPRESSION: 1. Interval placement of an endotracheal tube with the tip approximately 1 cm above the level of the carina. Recommend retraction 2-3 cm. 2. Appropriately positioned left subclavian central venous catheter. 3. Unchanged large left pleural effusion and left lung collapse. 4. Worsening airspace disease in the right upper lobe which could reflect edema or infection. Electronically Signed   By: Obie Dredge M.D.   On: 07/31/2017 10:51   Dg Chest Port 1 View  Result Date: 07/31/2017 CLINICAL DATA:  Acute respiratory failure EXAM: PORTABLE CHEST 1 VIEW COMPARISON:  07/30/2017 FINDINGS: Dense consolidation of the left hemithorax unchanged due to effusion and collapse. Progression of right lower lobe atelectasis/infiltrate. Diffuse airspace disease on the right may represent edema. IMPRESSION: Persistent collapse of the left lung Progressive right lower lobe atelectasis/infiltrate and small right effusion. Electronically Signed   By: Marlan Palau M.D.   On: 07/31/2017 07:26   Dg Abd Portable 1v  Result Date: 07/31/2017 CLINICAL DATA:  NG tube placement. EXAM: PORTABLE ABDOMEN - 1 VIEW COMPARISON:  None. FINDINGS: Enteric tube in place with the tip in the distal gastric body.  Nonobstructive bowel gas pattern. No acute osseous abnormality. IMPRESSION: Enteric tube appropriately positioned with the tip in the distal gastric body. Electronically Signed   By: Obie Dredge M.D.   On: 07/31/2017 10:52     CBC Recent Labs  Lab 08/11/17 0508 08/13/17 0449 08/14/17 0445 08/15/17 0425 08/16/17 0511  WBC 11.8* 11.8* 10.7 7.2 6.7  HGB 15.9 15.2 13.9 13.0 13.0  HCT 53.6* 49.5* 46.3 43.1 42.4  PLT 337 293 261 247 231  MCV 79.8* 79.8* 81.0 79.3* 79.3*  MCH 23.6* 24.4* 24.3* 24.0* 24.2*  MCHC 29.6* 30.6* 30.0* 30.2* 30.6*  RDW 21.9* 21.9* 22.1* 21.5* 21.5*  LYMPHSABS 1.5 1.6 1.6 1.3 1.1  MONOABS 1.4* 1.0* 0.9 0.5 0.4  EOSABS 0.1 0.1 0.1 0.3 0.2  BASOSABS 0.1 0.0 0.0 0.0 0.0    Chemistries  Recent Labs  Lab 08/11/17 0508 08/13/17 0449 08/14/17 0445 08/15/17 0425 08/16/17 0511  NA 148* 150* 147* 145 139  K 3.9 3.8 3.6 3.4* 3.1*  CL 102 103 106 106 101  CO2 34* 30 32 32 32  GLUCOSE 145* 163* 120* 106* 157*  BUN 52* 42* 33* 18 10  CREATININE 0.67 0.80 0.65 0.48 0.48  CALCIUM 10.4* 9.4 9.2 9.2 9.0  MG 2.0 1.9 1.9 1.6* 1.6*  AST  --  19  --   --   --   ALT  --  34  --   --   --   ALKPHOS  --  68  --   --   --   BILITOT  --  1.8*  --   --   --    ------------------------------------------------------------------------------------------------------------------ estimated creatinine clearance is 89.1 mL/min (by C-G formula based on SCr of 0.48 mg/dL). ------------------------------------------------------------------------------------------------------------------ No results for input(s): HGBA1C in the last 72 hours. ------------------------------------------------------------------------------------------------------------------ No results for input(s): CHOL, HDL, LDLCALC, TRIG, CHOLHDL, LDLDIRECT in the last 72 hours. ------------------------------------------------------------------------------------------------------------------ No results for input(s):  TSH, T4TOTAL, T3FREE, THYROIDAB in the last 72 hours.  Invalid input(s): FREET3 ------------------------------------------------------------------------------------------------------------------ No results for input(s): VITAMINB12, FOLATE, FERRITIN, TIBC, IRON, RETICCTPCT in the last 72 hours.  Coagulation profile Recent Labs  Lab 08/14/17 0445  INR 1.21    No results for input(s): DDIMER in the last 72 hours.  Cardiac Enzymes No results for input(s): CKMB, TROPONINI, MYOGLOBIN in the last 168 hours.  Invalid input(s): CK ------------------------------------------------------------------------------------------------------------------ Invalid input(s): POCBNP    Assessment & Plan  52 year old female patient currently in ICU for acute on chronic respiratory failure, pericardial effusion and heart failure exacerbation.  1. Acute on chronic respiratory failure with hypoxia Status post extubation by ENT in the OR days ago.  Transferred out of ICU, patient is breathing comfortably and denies any shortness of breath.   Patient is on Unasyn, Diflucan.,  Continue bronchodilators   2. Acute on chronic diastolic heart failure Lasix as needed for diuresis, appreciate cardiology following regarding pericardial effusion also.  No tamponade by echocardiogram, on low-dose Lasix.   3.  Left lung pneumonia versus pericarditis  Opacification of left hemithorax secondary to atelectasis and effusion, patient is on antibiotics with Unasyn, Diflucan.  4.Pericardial effusion with no evidence of pericardial tamponade Cardiology follow-up appreciated, patient is on small dose Lasix  5.. Started on dysphagia 3 diet with thin liquids, speech therapy is following to make further recommendations.,  Patient states that she does not feel like eating. speech therapy to see if we can advance the diet,   6.  DVT prophylaxis with subcu heparin 5000 units every 8 hourly  DC Foley catheter, therapy  recommended CPR, discontinue stool softeners because patient has diarrhea likely secondary to stool softener.  #7 hypokalemia, hypomagnesemia: Replace potassium, m/agnesium,  chest x-ray showed showed bibasilar infiltrates, add incentive spirometer, continue IV antibiotics.    Code Status Orders  (From admission, onward)        Start     Ordered   07/29/17 2224  Full code  Continuous     07/29/17 2223    Code Status History    Date Active Date Inactive Code Status Order ID Comments User Context   06/07/2017 1347 06/12/2017 2013 Full Code [id number]  Adrian Saran, MD Inpatient      Time Spent in minutes   35 minutes  Greater than 50% of time spent in care coordination and counseling patient regarding the condition and plan of care.   Katha Hamming M.D on 08/16/2017 at 9:49 AM  Between 7am to 6pm - Pager - [phone]  After 6pm go to [email or web address] - Social research officer, government  [employer]   Office  [phone]

## 2017-08-16 NOTE — Progress Notes (Signed)
SUBJECTIVE: patient is doing very well extubated and feeling much better   Vitals:   08/15/17 1946 08/16/17 0411 08/16/17 0500 08/16/17 0718  BP:  107/68    Pulse:  83    Resp:  20    Temp:  98.6 F (37 C)    TempSrc:  Oral    SpO2: 93% 95%  94%  Weight:   232 lb (105.2 kg)   Height:        Intake/Output Summary (Last 24 hours) at 08/16/2017 0856 Last data filed at 08/16/2017 0826 Gross per 24 hour  Intake 420 ml  Output 325 ml  Net 95 ml    LABS: Basic Metabolic Panel: Recent Labs    08/15/17 0425 08/16/17 0511  NA 145 139  K 3.4* 3.1*  CL 106 101  CO2 32 32  GLUCOSE 106* 157*  BUN 18 10  CREATININE 0.48 0.48  CALCIUM 9.2 9.0  MG 1.6* 1.6*  PHOS 3.9 3.5   Liver Function Tests: No results for input(s): AST, ALT, ALKPHOS, BILITOT, PROT, ALBUMIN in the last 72 hours. No results for input(s): LIPASE, AMYLASE in the last 72 hours. CBC: Recent Labs    08/15/17 0425 08/16/17 0511  WBC 7.2 6.7  NEUTROABS 5.1 5.0  HGB 13.0 13.0  HCT 43.1 42.4  MCV 79.3* 79.3*  PLT 247 231   Cardiac Enzymes: No results for input(s): CKTOTAL, CKMB, CKMBINDEX, TROPONINI in the last 72 hours. BNP: Invalid input(s): POCBNP D-Dimer: No results for input(s): DDIMER in the last 72 hours. Hemoglobin A1C: No results for input(s): HGBA1C in the last 72 hours. Fasting Lipid Panel: No results for input(s): CHOL, HDL, LDLCALC, TRIG, CHOLHDL, LDLDIRECT in the last 72 hours. Thyroid Function Tests: No results for input(s): TSH, T4TOTAL, T3FREE, THYROIDAB in the last 72 hours.  Invalid input(s): FREET3 Anemia Panel: No results for input(s): VITAMINB12, FOLATE, FERRITIN, TIBC, IRON, RETICCTPCT in the last 72 hours.   PHYSICAL EXAM General: Well developed, well nourished, in no acute distress HEENT:  Normocephalic and atramatic Neck:  No JVD.  Lungs: Clear bilaterally to auscultation and percussion. Heart: HRRR . Normal S1 and S2 without gallops or murmurs.  Abdomen: Bowel sounds are  positive, abdomen soft and non-tender  Msk:  Back normal, normal gait. Normal strength and tone for age. Extremities: No clubbing, cyanosis or edema.   Neuro: Alert and oriented X 3. Psych:  Good affect, responds appropriately  TELEMETRY: sinus rhythm 70 bpm  ASSESSMENT AND PLAN: status post respiratory failure and pleural effusion and thoracocentesis with pericardial effusion mild-to-moderate amount without hemodynamic compromise. Clinically patient is doing very well and and will repeat echocardiogram as an outpatient.  Principal Problem:   Acute on chronic respiratory failure with hypoxia (HCC) Active Problems:   Acute respiratory failure (HCC)   Pericardial effusion    Jaquay Morneault A, MD, [facility] CityFACC 08/16/2017 8:56 AM

## 2017-08-16 NOTE — Progress Notes (Signed)
Physical Therapy Treatment Patient Details Name: [name] MRN: [id number] DOB: 24-Apr-1966 Today's Date: 08/16/2017    History of Present Illness Pt is a 52 y.o. female presenting to hospital 07/29/17 with chest pain and increasing SOB.  Pt admitted to hospital with acute on chronic respiratory failure with hypoxia secondary acute on chronic diastolic CHF, pericardial effusion, pleural effusion, htn, and L sided PNA.  Transferred to CCU 07/30/17 and intubated 07/31/17 (very difficult airway and difficult to wean).  Pt extubated 08/14/17 and transferred to general medical floor.  PMH includes COPD (4 L home O2), htn, (+) smoking, anxiety.    PT Comments    Pt progressed bed mobility (CGA), transfers, and ambulation (5 ft) (min to mod x 2 physical assist) (see mobility section for details. Pt able to stand and take steps to chair w/ therapists min/mod assist for lift; blocking B knees (history of L knee buckling), and vc's for stepping pattern. Pt HR and O2 monitored throughout session and stayed WNL while on 4L of oxygen. PT will focus on progression of strength, transfers, and ambulation at next session.   Follow Up Recommendations  CIR     Equipment Recommendations  Rolling walker with 5" wheels;3in1 (PT)    Recommendations for Other Services       Precautions / Restrictions Precautions Precautions: Fall Restrictions Weight Bearing Restrictions: No    Mobility  Bed Mobility Overal bed mobility: Modified Independent Bed Mobility: Supine to Sit     Supine to sit: HOB elevated;Min guard    General bed mobility comments: required extra time and effort to move to EOB; CGA for safety; vc's to push  up off of bed to bring trunk forward  Transfers Overall transfer level: Needs assistance Equipment used: Rolling walker (2 wheeled) Transfers: Sit to/from Stand Sit to Stand: +2 physical assistance;Min assist;Mod assist         General transfer comment: min to mod assist to help  lift; B knees blocked by therapists; vc's to scoot to EOB, lean forward nose over toes, and push through BLE to stand up tall, bring hips forward  Ambulation/Gait Ambulation/Gait assistance: Mod assist;+2 physical assistance Ambulation Distance (Feet): 5 Feet Assistive device: Rolling walker (2 wheeled) Gait Pattern/deviations: Step-to pattern;Decreased step length - left;Decreased step length - right Gait velocity: decreased   General Gait Details: RW mod assist X2 for stability, blocking B knees, vc's for stepping pattern. Pt took steps from bed to chair.   Stairs            Wheelchair Mobility    Modified Rankin (Stroke Patients Only)       Balance Overall balance assessment: Needs assistance Sitting-balance support: No upper extremity supported;Feet supported Sitting balance-Leahy Scale: Good Sitting balance - Comments: demonstrated good stability sitting EOB     Standing balance-Leahy Scale: Poor Standing balance comment: Pt performed sit to stand x 4 min/mod assist x 2 physical assist w/ B knees blocked by therapist; Pt stood in standing position for a couple of minutes each time and practiced stepping                            Cognition Arousal/Alertness: Awake/alert Behavior During Therapy: WFL for tasks assessed/performed Overall Cognitive Status: Within Functional Limits for tasks assessed  General Comments: Pt A&O x 4      Exercises      General Comments   Pt agreeable to session     Pertinent Vitals/Pain Pain Assessment: No/denies pain Pain Score: 0-No pain Pain Intervention(s): Limited activity within patient's tolerance;Monitored during session    Home Living                      Prior Function            PT Goals (current goals can now be found in the care plan section) Acute Rehab PT Goals Patient Stated Goal: I want to get stronger PT Goal Formulation: With  patient Time For Goal Achievement: 08/24/17 Potential to Achieve Goals: Fair Progress towards PT goals: Progressing toward goals    Frequency    7X/week      PT Plan Current plan remains appropriate    Co-evaluation              AM-PAC PT "6 Clicks" Daily Activity  Outcome Measure  Difficulty turning over in bed (including adjusting bedclothes, sheets and blankets)?: A Little Difficulty moving from lying on back to sitting on the side of the bed? : A Lot Difficulty sitting down on and standing up from a chair with arms (e.g., wheelchair, bedside commode, etc,.)?: Unable Help needed moving to and from a bed to chair (including a wheelchair)?: Total Help needed walking in hospital room?: Total Help needed climbing 3-5 steps with a railing? : Total 6 Click Score: 9    End of Session Equipment Utilized During Treatment: Gait belt;Oxygen Activity Tolerance: Patient limited by fatigue;Patient limited by pain Patient left: with call bell/phone within reach;in chair;with chair alarm set Nurse Communication: Mobility status PT Visit Diagnosis: Muscle weakness (generalized) (M62.81);Other abnormalities of gait and mobility (R26.89);Difficulty in walking, not elsewhere classified (R26.2)     Time: 9604-5409 PT Time Calculation (min) (ACUTE ONLY): 32 min  Charges:                       G Codes:        [name], SPT 08/16/2017, 4:36 PM

## 2017-08-16 NOTE — Progress Notes (Signed)
Rehab Admissions Coordinator Note:  Patient was screened by Trish MageLogue, Fabrizio Filip M for appropriateness for an Inpatient Acute Rehab Consult.  Noted PT/OT are recommending inpatient rehab.  I will follow up with case manager and follow along for rehab needs.  Trish MageLogue, Zahraa Bhargava M 08/16/2017, 3:35 PM  I can be reached at [phone].

## 2017-08-16 NOTE — Care Management (Signed)
Spoke with Ms. Hofbauer at the bedside. Discussed inpatient Acute Rehab recommendation per physical and occupational therapy. States that she does want to go to Inpatient Rehab or a  local skilled nursing facility.  She would like to go home with Home Health services in the home. [facility] supplies home oxygen which she wears at 4 liters per nasal cannula continuous. States she has a bedside commode and wheelchair in the home.  Will update Dr. Luberta MutterKonidena and Elroy ChannelGenny Logue, RN representative for Inpatient Acute Rehab. Gwenette GreetBrenda S Brodie Correll RN MSN CCM Care Management [phone]

## 2017-08-16 NOTE — Evaluation (Signed)
Occupational Therapy Evaluation Patient Details Name: [name] MRN: [id number] DOB: [birth year] Today's Date: 08/16/2017    History of Present Illness Pt is a 52 y.o. female presenting to hospital 07/29/17 with chest pain and increasing SOB.  Pt admitted to hospital with acute on chronic respiratory failure with hypoxia secondary acute on chronic diastolic CHF, pericardial effusion, pleural effusion, htn, and L sided PNA.  Transferred to CCU 07/30/17 and intubated 07/31/17 (very difficult airway and difficult to wean).  Pt extubated 08/14/17 and transferred to general medical floor.  PMH includes COPD (4 L home O2), htn, (+) smoking, anxiety.   Clinical Impression   Pt. presents with weakness, limited activity tolerance, and limited functional mobility which limits the ability to complete basic ADL and IADL functioning. Pt. resides at home with her mother, boyfriend, daughter, and grandchildren. Pt. was independent with ADLs, and IADL functioning: including meal preparation, and medication management. Pt. was her mother's primary caregiver. Pt. was previously on 4L O2. Pt. education was provided about A/E use, and energy conservation. Pt. was provided with a visual handout. Pt. could benefit from OT services for ADL training, A/E training, and pt. Education about energy conservation, and work simplification techniques, home modification, and DME. Pt. Would benefit from CIR level of care upon discharge, with follow-up OT services.     Follow Up Recommendations  CIR    Equipment Recommendations       Recommendations for Other Services Rehab consult;PT consult     Precautions / Restrictions Precautions Precautions: Fall Precaution Comments: Mechanical ventilation; gastric tube; HOB >30 degrees Restrictions Weight Bearing Restrictions: No      Mobility Bed Mobility Overal bed mobility: Needs Assistance         Sit to supine: Max assist;+2 for physical assistance       Transfers Overall transfer level: Needs assistance Equipment used: Rolling walker (2 wheeled)   Sit to Stand: +2 physical assistance;Max assist         General transfer comment: Mobility: Per PT    Balance                                           ADL either performed or assessed with clinical judgement   ADL Overall ADL's : Needs assistance/impaired Eating/Feeding: Set up;Independent(fatigues)   Grooming: Set up;Independent   Upper Body Bathing: Set up;Minimal assistance   Lower Body Bathing: Set up;Maximal assistance   Upper Body Dressing : Set up;Moderate assistance   Lower Body Dressing: Set up;Maximal assistance   Toilet Transfer: Maximal assistance;+2 for physical assistance           Functional mobility during ADLs: +2 for physical assistance;Maximal assistance General ADL Comments: Pt. edcuation was provided about energy conservation. A visual handout was provided.     Vision Baseline Vision/History: No visual deficits Patient Visual Report: No change from baseline       Perception     Praxis      Pertinent Vitals/Pain Pain Assessment: No/denies pain     Hand Dominance Right   Extremity/Trunk Assessment Upper Extremity Assessment Upper Extremity Assessment: Generalized weakness           Communication     Cognition Arousal/Alertness: Awake/alert Behavior During Therapy: WFL for tasks assessed/performed Overall Cognitive Status: Within Functional Limits for tasks assessed  General Comments       Exercises     Shoulder Instructions      Home Living Family/patient expects to be discharged to:: Private residence Living Arrangements: Spouse/significant other;Children;Parent Available Help at Discharge: Family;Available 24 hours/day(Resides with her boyfriend, mother, daughter, and grandchildren ) Type of Home: House Home Access: Stairs to enter Water quality scientistntrance  Stairs-Number of Steps: 3 Entrance Stairs-Rails: Can reach both Home Layout: One level     Bathroom Shower/Tub: Chief Strategy OfficerTub/shower unit   Bathroom Toilet: Standard     Home Equipment: Grab bars - tub/shower;Wheelchair - [employer] Corporationmanual;Walker - 2 wheels;Hand held shower head   Additional Comments: Pt reports that she also has a seat that can be put in the tub.      Prior Functioning/Environment Level of Independence: Independent        Comments: Pt. was Independent with ADLs, and IADLs. Pt. was main caregiver for her mother. Pt. was on 4L O2 at home.        OT Problem List: Decreased strength;Decreased activity tolerance;Decreased range of motion;Obesity;Impaired balance (sitting and/or standing);Decreased knowledge of use of DME or AE;Cardiopulmonary status limiting activity      OT Treatment/Interventions: Self-care/ADL training;Therapeutic exercise;Patient/family education;Energy conservation;Therapeutic activities;DME and/or AE instruction    OT Goals(Current goals can be found in the care plan section)    OT Frequency: Min 3X/week   Barriers to D/C:            Co-evaluation              AM-PAC PT "6 Clicks" Daily Activity     Outcome Measure Help from another person eating meals?: A Little Help from another person taking care of personal grooming?: A Little Help from another person toileting, which includes using toliet, bedpan, or urinal?: A Lot Help from another person bathing (including washing, rinsing, drying)?: A Lot Help from another person to put on and taking off regular upper body clothing?: A Lot Help from another person to put on and taking off regular lower body clothing?: A Lot 6 Click Score: 14   End of Session Equipment Utilized During Treatment: Gait belt  Activity Tolerance: Patient tolerated treatment well Patient left: in bed;with family/visitor present(Respiratory)  OT Visit Diagnosis: Unsteadiness on feet (R26.81);Muscle weakness (generalized)  (M62.81)                Time: 1041-1110 OT Time Calculation (min): 29 min Charges:  OT General Charges $OT Visit: 1 Visit OT Evaluation $OT Eval Moderate Complexity: 1 Mod G-Codes:     Olegario MessierElaine Asuka Dusseau, MS, OTR/L  Olegario MessierElaine Sidharth Leverette, MS, OTR/L 08/16/2017, 12:49 PM

## 2017-08-17 LAB — BASIC METABOLIC PANEL
Anion gap: 7 (ref 5–15)
BUN: 8 mg/dL (ref 6–20)
CHLORIDE: 99 mmol/L — AB (ref 101–111)
CO2: 31 mmol/L (ref 22–32)
CREATININE: 0.55 mg/dL (ref 0.44–1.00)
Calcium: 9.1 mg/dL (ref 8.9–10.3)
GFR calc Af Amer: >60 mL/min (ref >60)
GFR calc non Af Amer: >60 mL/min (ref >60)
Glucose, Bld: 114 mg/dL — ABNORMAL HIGH (ref 65–99)
POTASSIUM: 3.7 mmol/L (ref 3.5–5.1)
Sodium: 137 mmol/L (ref 135–145)

## 2017-08-17 LAB — GLUCOSE, CAPILLARY
Glucose-Capillary: 116 mg/dL — ABNORMAL HIGH (ref 65–99)
Glucose-Capillary: 116 mg/dL — ABNORMAL HIGH (ref 65–99)
Glucose-Capillary: 124 mg/dL — ABNORMAL HIGH (ref 65–99)

## 2017-08-17 LAB — CALCIUM, IONIZED: Calcium, Ionized, Serum: 5.1 mg/dL (ref 4.5–5.6)

## 2017-08-17 MED ORDER — BUDESONIDE 0.5 MG/2ML IN SUSP: 0.5000 mg | Freq: Two times a day (BID) | RESPIRATORY_TRACT | 12 refills | Status: DC

## 2017-08-17 MED ORDER — FLUCONAZOLE 100 MG PO TABS: 100.0000 mg | ORAL_TABLET | Freq: Every day | ORAL | 0 refills | Status: DC

## 2017-08-17 MED ORDER — SERTRALINE HCL 50 MG PO TABS: 50.0000 mg | ORAL_TABLET | Freq: Every day | ORAL | 0 refills | Status: DC

## 2017-08-17 MED ORDER — IPRATROPIUM-ALBUTEROL 0.5-2.5 (3) MG/3ML IN SOLN: 3.0000 mL | Freq: Four times a day (QID) | RESPIRATORY_TRACT | 0 refills | Status: DC

## 2017-08-17 MED ORDER — PREMIER PROTEIN SHAKE: 11.0000 [oz_av] | Freq: Two times a day (BID) | ORAL | 0 refills | Status: DC

## 2017-08-17 MED ORDER — FUROSEMIDE 20 MG PO TABS: 20.0000 mg | ORAL_TABLET | Freq: Every day | ORAL | 0 refills | Status: DC

## 2017-08-17 NOTE — Care Management (Addendum)
Discharge to home today per Dr. Luberta MutterKonidena. Will request [facility] for home services.  Transportation will be arranged per EMS Gwenette GreetBrenda S Beda Dula RN MSN CCM Care Management [phone]

## 2017-08-17 NOTE — Clinical Social Work Note (Signed)
CSW received referral for SNF.  Case discussed with case manager and plan is to discharge home with home health.  CSW to sign off please re-consult if social work needs arise.  Geanette Buonocore R. Kareena Arrambide, MSW, LCSWA [phone]  

## 2017-08-17 NOTE — Discharge Summary (Signed)
[name], is a 52 y.o. female  DOB 1965-10-18  MRN [id number].  Admission date:  07/29/2017  Admitting Physician  Altamese Dilling, MD  Discharge Date:  08/17/2017   Primary MD  [name], Nadyne Coombes, MD  Recommendations for primary care physician for things to follow:   Follow-up with PCP in 1 week   Admission Diagnosis  Pericardial effusion [I31.3] Acute on chronic respiratory failure (HCC) [J96.20] Acute on chronic respiratory failure with hypoxia (HCC) [J96.21]   Discharge Diagnosis  Pericardial effusion [I31.3] Acute on chronic respiratory failure (HCC) [J96.20] Acute on chronic respiratory failure with hypoxia (HCC) [J96.21]   Principal Problem:   Acute on chronic respiratory failure with hypoxia (HCC) Active Problems:   Acute respiratory failure (HCC)   Pericardial effusion      Past Medical History:  Diagnosis Date  . Anxiety   . COPD (chronic obstructive pulmonary disease) (HCC)   . Essential hypertension   . History of echocardiogram    a. 08/2013 Echo: EF 55-60%, impaired LV relaxation, mild LVH, nl RV fxn, nl RVSP, mild TR; b. 05/2017 Echo: EF 65-70%, no rwma, nl RV fxn.  . Morbid obesity (HCC)   . Tobacco abuse     Past Surgical History:  Procedure Laterality Date  . ABDOMINAL HYSTERECTOMY    . CHOLECYSTECTOMY    . EXTUBATION (ENDOTRACHEAL) IN OR N/A 08/14/2017   Procedure: EXTUBATION (ENDOTRACHEAL) IN OR;  Surgeon: Geanie Logan, MD;  Location: [facility] ORS;  Service: ENT;  Laterality: N/A;  . HERNIA REPAIR         History of present illness and  Hospital Course:     Kindly see H&P for history of present illness and admission details, please review complete Labs, Consult reports and Test reports for all details in brief  HPI  from the history and physical done on the day of  admission 52 year old morbidly obese female with BMI of 47 came in because of loss of breath and found to have acute on chronic respiratory failure, intubated and admitted to ICU.   Hospital Course  #1 acute on chronic respiratory failure with hypoxia, patient is intubated, admitted to ICU, patient has COPD exacerbation, left lung pneumonia.  Patient received bronchodilators, steroids, IV antibiotics, patient has difficulty intubating so she is extubated by ENT in  OR on April 2, after extubation patient remains on nasal cannula without any difficulty breathing, patient uses 4 L of oxygen at home.  She can continue that.  Patient respiratory flora from tracheal aspirate showed normal flora.  Patient received Zosyn initially, after that he received vancomycin, Unasyn. 2.  Essential hypertension: Patient takes Norvasc, Lasix. 3.  History of COPD: Patient can continue his albuterol inhaler, will give prescription for Pulmicort nebulizer, DuoNeb's for a few more days to continue, patient can continue her floor throughout but she is using here and also incentive spirometry to help improve her secretions, lung function. #4 because of her difficult airway patient extubated in OR with ENT. 5.  Diastolic heart failure, patient received diuresis to improve lung complaints. #6 pericardial effusion without evidence of pericardial tamponade, patient is seen by cardiology.  Not have any cardiac intervention.  6.  History of oral thrush patient received Diflucan.  Patient will continue 5 more days.  Says that food does not taste good. #7 deconditioning, physical therapy recommended CAR.  Patient refused to go to inpatient rehab/SNF.  She wants to go home.  Arranging home health physical therapy, RN, home health aide.  Discharge Condition: stable   Follow UP  Follow-up Information    Schedule an appointment as soon as possible for a visit with [name], Nadyne Coombes, MD.   Specialty:  Family Medicine Why:   Office will schedule appt. with patient Contact information: [id number] [facility] [place] [address] [phone]        Laurier Nancy, MD. Nyra Capes on 08/28/2017.   Specialty:  Cardiology Why:  10 AM Contact information: [address] [place] [address] [phone]             Discharge Instructions  and  Discharge Medications      Allergies as of 08/17/2017   No Known Allergies     Medication List    STOP taking these medications   nitroGLYCERIN 0.4 MG SL tablet Commonly known as:  NITROSTAT     TAKE these medications   albuterol 108 (90 Base) MCG/ACT inhaler Commonly known as:  PROVENTIL HFA;VENTOLIN HFA Inhale 2 puffs into the lungs every 6 (six) hours as needed for wheezing or shortness of breath.   amitriptyline 10 MG tablet Commonly known as:  ELAVIL Take 10 mg by mouth at bedtime as needed.   amLODipine 5 MG tablet Commonly known as:  NORVASC Take 5 mg by mouth daily.   aspirin EC 81 MG tablet Take 81 mg by mouth daily.   budesonide 0.5 MG/2ML nebulizer solution Commonly known as:  PULMICORT Take 2 mLs (0.5 mg total) by nebulization 2 (two) times daily.   buPROPion 100 MG 12 hr tablet Commonly known as:  WELLBUTRIN SR Take 100 mg by mouth 2 (two) times daily.   clonazePAM 0.5 MG tablet Commonly known as:  KLONOPIN Take 0.5 mg by mouth 3 (three) times daily as needed.   cyclobenzaprine 10 MG tablet Commonly known as:  FLEXERIL Take 10 mg by mouth 3 (three) times daily as needed.   fluconazole 100 MG tablet Commonly known as:  DIFLUCAN Take 1 tablet (100 mg total) by mouth daily. Start taking on:  08/18/2017   furosemide 20 MG tablet Commonly known as:  LASIX Take 1 tablet (20 mg total) by mouth daily.   ipratropium-albuterol 0.5-2.5 (3) MG/3ML Soln Commonly known as:  DUONEB Take 3 mLs by nebulization every 6 (six) hours.   Mometasone Furoate 200 MCG/ACT Aero Inhale 2 puffs into the lungs.    naproxen sodium 220 MG tablet Commonly known as:  ALEVE Take 220 mg by mouth daily as needed.   protein supplement shake Liqd Commonly known as:  PREMIER PROTEIN Take 325 mLs (11 oz total) by mouth 2 (two) times daily between meals.   senna-docusate 8.6-50 MG tablet Commonly known as:  Senokot-S Take 1 tablet by mouth at bedtime as needed for mild constipation.   sertraline 50 MG tablet Commonly known as:  ZOLOFT Take 1 tablet (50 mg total) by mouth at bedtime.         Diet and Activity recommendation: See Discharge Instructions above   Consults obtained -intensivist, ENT, speech therapy, physical therapy   Major procedures and Radiology Reports - PLEASE review detailed and final reports for all details, in brief -      Dg Chest 2 View  Result Date: 07/30/2017 CLINICAL DATA:  Shortness of breath. Acute on chronic respiratory failure. Hypoxia. Morbid obesity. EXAM: CHEST - 2 VIEW COMPARISON:  Chest x-ray and CT chest 07/29/2017. FINDINGS: Cardiac silhouette is partially silhouetted by large pleural effusion on the LEFT. There is  a whiteout of the LEFT hemithorax, which could represent mucous plugging and or increasing effusion. The RIGHT lung demonstrates interstitial edema with small effusion. IMPRESSION: Marked worsening aeration. Complete opacity of the LEFT hemithorax represents a significant change from yesterday's radiograph. Electronically Signed   By: Elsie Stain M.D.   On: 07/30/2017 08:45   Dg Chest 2 View  Result Date: 07/29/2017 CLINICAL DATA:  52 year old female with history of chest pain since last week. Pain most severe during deep breathing. EXAM: CHEST - 2 VIEW COMPARISON:  Chest x-ray 06/10/2017. FINDINGS: Left lower lobe airspace consolidation concerning for pneumonia. Small bilateral pleural effusions. Mild diffuse peribronchial cuffing. Mild cardiomegaly. Upper mediastinal contours appear widened, but are stable compared to prior examinations. IMPRESSION:  1. Left lower lobe pneumonia with small bilateral pleural effusions. Followup PA and lateral chest X-ray is recommended in 3-4 weeks following trial of antibiotic therapy to ensure resolution and exclude underlying malignancy. 2. Mild cardiomegaly. Electronically Signed   By: Trudie Reed M.D.   On: 07/29/2017 17:37   Dg Abd 1 View  Result Date: 08/12/2017 CLINICAL DATA:  Dyspepsia.  Difficulty feeding. EXAM: ABDOMEN - 1 VIEW COMPARISON:  08/07/2017 FINDINGS: The enteric tube tip projects over the expected location of the distal stomach. Side port is well below GE junction. Cholecystectomy clips noted. No abnormal bowel dilatation identified. IMPRESSION: 1. NG tube tip within the body of stomach. 2. No abnormal bowel dilatation noted. Electronically Signed   By: Signa Kell M.D.   On: 08/12/2017 10:40   Dg Abd 1 View  Result Date: 08/07/2017 CLINICAL DATA:  Fecal impaction. EXAM: ABDOMEN - 1 VIEW COMPARISON:  07/31/2017 FINDINGS: Nasogastric tube has its tip in the midportion of the stomach. Clips in the right upper quadrant consistent with previous cholecystectomy. Single clip in the right lower quadrant, possibly appendectomy. Moderate amount of fecal matter within the right colon. No sign of obstruction. IMPRESSION: Nasogastric tube in the mid stomach. Moderate amount of fecal matter in the right colon. No sign of obstruction. Electronically Signed   By: Paulina Fusi M.D.   On: 08/07/2017 11:32   Ct Chest Wo Contrast  Result Date: 07/31/2017 CLINICAL DATA:  She presented to [facility] ER on 03/17 with c/o intermittent chest pain worse during inspiration and nonproductive cough onset 1 week prior to presentation. On 03/18 pt developed hypotension and worsening acute hypoxic respiratory failure. EXAM: CT CHEST WITHOUT CONTRAST TECHNIQUE: Multidetector CT imaging of the chest was performed following the standard protocol without IV contrast. COMPARISON:  07/29/2017, 06/10/2017 FINDINGS: Cardiovascular:  Stable cardiomegaly. Small pericardial effusion. Dilatation of main pulmonary artery as well as the right and left main pulmonary arteries as can be seen with pulmonary arterial hypertension. Thoracic aorta is normal in caliber. Mediastinum/Nodes: Enlarged prevascular lymph node measuring 17 mm in short axis. Multiple other smaller mediastinal lymph nodes are noted. Thyroid gland, trachea, and esophagus demonstrate no significant findings. Lungs/Pleura: Trace right pleural effusion. Small left pleural effusion. Left lower lobe airspace disease with air bronchograms concerning for pneumonia. Improved aeration of left upper lobe. Mild persistent right upper lobe airspace disease likely reflecting atelectasis. Stable 6 mm right middle lobe pulmonary nodule unchanged compared with 09/01/2013. Upper Abdomen: Aortic atherosclerosis. Musculoskeletal: No acute osseous abnormality. No aggressive osseous lesion. IMPRESSION: 1. Left lower lobe airspace disease with air bronchograms concerning for combination of pneumonia and atelectasis. Mild mediastinal lymphadenopathy likely reactive. 2. Small left pleural effusion.  Mild right upper lobe atelectasis. 3.  Aortic Atherosclerosis (ICD10-I70.0). Electronically  Signed   By: Elige KoHetal  Patel   On: 07/31/2017 12:47   Ct Angio Chest Pe W And/or Wo Contrast  Result Date: 07/29/2017 CLINICAL DATA:  52 year old female with history of chest pain since last week. Difficulty taking deep breaths. EXAM: CT ANGIOGRAPHY CHEST WITH CONTRAST TECHNIQUE: Multidetector CT imaging of the chest was performed using the standard protocol during bolus administration of intravenous contrast. Multiplanar CT image reconstructions and MIPs were obtained to evaluate the vascular anatomy. CONTRAST:  75mL ISOVUE-370 IOPAMIDOL (ISOVUE-370) INJECTION 76% COMPARISON:  Chest CT 06/10/2017. FINDINGS: Cardiovascular: No filling defects within the pulmonary arterial tree to suggest underlying pulmonary embolism.  Heart size is normal. Moderate volume of pericardial fluid with pericardial enhancement concerning for pericarditis. No pericardial calcification. There is aortic atherosclerosis, as well as atherosclerosis of the great vessels of the mediastinum and the coronary arteries, including calcified atherosclerotic plaque in the left anterior descending coronary artery. Mediastinum/Nodes: Multiple prominent borderline enlarged and enlarged mediastinal lymph nodes, largest of which is in the prevascular nodal station measuring up to 22 mm in short axis, increased compared to the prior study. Esophagus is unremarkable in appearance. No axillary lymphadenopathy. Lungs/Pleura: Moderate left pleural effusion with near complete passive atelectasis of the left lower lobe. Some dependent subsegmental atelectasis is also noted in the left upper lobe. Trace right pleural effusion. Throughout the remaining portions of the lungs there is a background of ground-glass attenuation and mild interlobular septal thickening, indicative of a background of mild interstitial pulmonary edema. 5 mm right middle lobe pulmonary nodule (axial image 47 of series 7), stable dating back to at least 09/01/2013, considered definitively benign. Upper Abdomen: Aortic atherosclerosis. Musculoskeletal: There are no aggressive appearing lytic or blastic lesions noted in the visualized portions of the skeleton. Review of the MIP images confirms the above findings. IMPRESSION: 1. No evidence of pulmonary embolism. 2. Extensive pericardial enhancement with moderate volume of pericardial fluid, indicative of an acute pericarditis. 3. This is associated with increasing mediastinal lymphadenopathy compared to the prior study. 4. In addition, there is evidence of mild pulmonary edema and bilateral pleural effusions (left greater than right) with extensive areas of passive atelectasis in the left lung. 5. Aortic atherosclerosis, in addition to left anterior  descending coronary artery disease. Please note that although the presence of coronary artery calcium documents the presence of coronary artery disease, the severity of this disease and any potential stenosis cannot be assessed on this non-gated CT examination. Assessment for potential risk factor modification, dietary therapy or pharmacologic therapy may be warranted, if clinically indicated. Aortic Atherosclerosis (ICD10-I70.0). Electronically Signed   By: Trudie Reedaniel  Entrikin M.D.   On: 07/29/2017 19:45   Dg Chest Port 1 View  Result Date: 08/16/2017 CLINICAL DATA:  Atelectasis EXAM: PORTABLE CHEST 1 VIEW COMPARISON:  08/15/2017 FINDINGS: Left subclavian central venous catheter tip in the SVC unchanged. No pneumothorax. Bibasilar airspace disease with mild progression.  No effusion. IMPRESSION: Progression of bibasilar atelectasis/infiltrate. Electronically Signed   By: Marlan Palauharles  Clark M.D.   On: 08/16/2017 07:34   Dg Chest Port 1 View  Result Date: 08/15/2017 CLINICAL DATA:  Status postextubation EXAM: PORTABLE CHEST 1 VIEW COMPARISON:  August 14, 2017 FINDINGS: Endotracheal tube and nasogastric tube have been removed. Central catheter tip is in superior vena cava. No pneumothorax. There is persistent cardiomegaly with pulmonary vascularity within normal limits. There is trace interstitial edema. There is no appreciable consolidation. No new opacity. No adenopathy. No bone lesions. IMPRESSION: Central catheter tip in superior vena  cava without pneumothorax. Cardiomegaly with trace interstitial edema. No consolidation. No new opacity. Electronically Signed   By: Bretta Bang III M.D.   On: 08/15/2017 07:38   Dg Chest Port 1 View  Result Date: 08/14/2017 CLINICAL DATA:  Pneumonia EXAM: PORTABLE CHEST 1 VIEW COMPARISON:  08/13/2017, 08/11/2017, 08/10/2017 FINDINGS: Endotracheal tube tip is about 2.6 cm superior to the carina. Left-sided central venous catheter tip overlies the SVC. Esophageal tube tip is  below the diaphragm but non included. Cardiomegaly with vascular congestion and mild diffuse pulmonary edema. Superimposed airspace disease at the bases, no significant change. Negative for pneumothorax. IMPRESSION: 1. Cardiomegaly with vascular congestion and mild edema, no significant change 2. Superimposed airspace disease at the bilateral lung bases, atelectasis versus pneumonia, also without significant interval change compared to most recent prior. Electronically Signed   By: Jasmine Pang M.D.   On: 08/14/2017 03:04   Dg Chest Port 1 View  Result Date: 08/13/2017 CLINICAL DATA:  Pneumonia EXAM: PORTABLE CHEST 1 VIEW COMPARISON:  08/11/2017 FINDINGS: Endotracheal tube in good position. Central venous catheter tip SVC. NG tube in the stomach. Bilateral airspace disease unchanged, most prominent in the lung bases. Small effusions bilaterally. IMPRESSION: Endotracheal tube and central line unchanged in position Diffuse bilateral airspace disease basilar predominant unchanged. CHF versus pneumonia. Electronically Signed   By: Marlan Palau M.D.   On: 08/13/2017 07:30   Dg Chest Port 1 View  Result Date: 08/11/2017 CLINICAL DATA:  Pneumonia EXAM: PORTABLE CHEST 1 VIEW COMPARISON:  Yesterday FINDINGS: Endotracheal tube tip at the clavicular heads. Left subclavian central line with tip at the distal SVC. An orogastric tube reaches the stomach. Indistinct opacities at the lung bases. Generalized interstitial coarsening that is stable. Unchanged cardiopericardial enlargement. IMPRESSION: 1. Stable positioning of tubes and central line. 2. History of pneumonia with unchanged lung opacity. 3. Cardiopericardial enlargement. Pericardial effusion by recent chest CT. Electronically Signed   By: Marnee Spring M.D.   On: 08/11/2017 07:45   Dg Chest Port 1 View  Result Date: 08/10/2017 CLINICAL DATA:  Rhonchi, acute on chronic respiratory failure with hypoxia, heart failure, pericardial effusion, pneumonia versus  pericarditis, COPD EXAM: PORTABLE CHEST 1 VIEW COMPARISON:  Portable exam 1445 hours compared to 08/08/2017 FINDINGS: Tip of endotracheal tube projects 3.0 cm above carina. Nasogastric tube extends into stomach. LEFT subclavian central venous catheter tip projects over SVC. Enlargement of cardiac silhouette with vascular congestion. Increased RIGHT lung infiltrate. Questionable LEFT lower lobe atelectasis versus consolidation. No gross pleural effusion or pneumothorax. IMPRESSION: Increased interstitial infiltrates in RIGHT lung with questionable atelectasis versus infiltrate in LEFT lower lobe. Enlargement of cardiac silhouette with pulmonary vascular congestion. Sign report Electronically Signed   By: Ulyses Southward M.D.   On: 08/10/2017 15:08   Dg Chest Port 1 View  Result Date: 08/08/2017 CLINICAL DATA:  Acute respiratory distress. History of acute on chronic respiratory failure, pericardial effusion, COPD, current smoker. EXAM: PORTABLE CHEST 1 VIEW COMPARISON:  Portable chest x-ray of August 07, 2017 FINDINGS: The lungs are well-expanded. Persistent bibasilar interstitial and airspace opacities are present. The cardiac silhouette is enlarged. The pulmonary vascularity is mildly congested. The endotracheal tube tip lies 3.3 cm above the carina. The esophagogastric tube tip projects below the inferior margin of the image. The left internal jugular venous catheter tip projects over the junction of the proximal and midportions of the SVC. IMPRESSION: Bibasilar atelectasis or pneumonia. Mild CHF. The support tubes are in reasonable position. Electronically Signed   By:  David  [place] M.D.   On: 08/08/2017 09:39   Dg Chest Port 1 View  Result Date: 08/07/2017 CLINICAL DATA:  Hypoxia EXAM: PORTABLE CHEST 1 VIEW COMPARISON:  August 05, 2017 FINDINGS: Endotracheal tube tip is 4.0 cm above the carina. Central catheter tip is in superior vena cava. Nasogastric tube tip and side port are below the diaphragm. No  pneumothorax. There are small pleural effusions bilaterally. There is consolidation in the left lower lobe as well as to a lesser extent in the right base. Heart is enlarged with pulmonary venous hypertension. No adenopathy. No bone lesions. IMPRESSION: Tube and catheter positions as described without pneumothorax. Bibasilar consolidation, more on the left than on the right, likely multifocal pneumonia. A degree of alveolar edema in the bases is possible. There is underlying pulmonary vascular congestion with small pleural effusions bilaterally. Electronically Signed   By: Bretta Bang III M.D.   On: 08/07/2017 07:36   Dg Chest Port 1 View  Result Date: 08/05/2017 CLINICAL DATA:  Acute respiratory failure, COPD, hypertension, smoker EXAM: PORTABLE CHEST 1 VIEW COMPARISON:  Portable exam 0548 hours compared to 08/04/2017 FINDINGS: Tip of endotracheal tube projects 4.1 cm above carina. Nasogastric tube extends into stomach. LEFT subclavian central venous catheter tip projects over SVC. Numerous EKG leads project over chest. Enlargement of cardiac silhouette with pulmonary vascular congestion. Scattered interstitial infiltrates likely representing pulmonary edema. Decreased LEFT pleural effusion. LEFT lower lobe opacification could represent atelectasis or coexistent consolidation. No pneumothorax. IMPRESSION: Persistent pulmonary edema with decreased LEFT pleural effusion. LEFT lower lobe atelectasis versus consolidation. Electronically Signed   By: Ulyses Southward M.D.   On: 08/05/2017 08:11   Dg Chest Port 1 View  Result Date: 08/04/2017 CLINICAL DATA:  Respiratory failure. EXAM: PORTABLE CHEST 1 VIEW COMPARISON:  Yesterday. FINDINGS: Endotracheal tube in satisfactory position. Nasogastric tube extending the stomach. Stable left subclavian catheter. Increased patchy opacity in the perihilar regions bilaterally. No significant change in patchy density interstitial prominence elsewhere in the right lung.  Increased left pleural fluid. Stable enlarged cardiac silhouette. No acute bony abnormality. IMPRESSION: 1. Mild worsening of changes of pulmonary edema. 2. Increased left pleural fluid. 3. Stable cardiomegaly. Electronically Signed   By: Beckie Salts M.D.   On: 08/04/2017 07:05   Dg Chest Port 1 View  Result Date: 08/03/2017 CLINICAL DATA:  Acute respiratory failure. EXAM: PORTABLE CHEST 1 VIEW COMPARISON:  Radiograph yesterday at 0456 hour.  Chest CT 07/31/2016 FINDINGS: Endotracheal tube tip at the thoracic inlet. Enteric tube in place, tip below the diaphragm. Left central line with tip in the proximal SVC. Unchanged cardiomegaly. Unchanged vascular congestion/pulmonary edema. Bibasilar opacities, left greater than right, unchanged. No pneumothorax. IMPRESSION: Unchanged appearance of the chest with cardiomegaly, pulmonary edema/vascular congestion, and bibasilar opacities, left greater than right. Electronically Signed   By: Rubye Oaks M.D.   On: 08/03/2017 01:48   Dg Chest Port 1 View  Result Date: 08/02/2017 CLINICAL DATA:  52 year old female with respiratory failure. Left lung pneumonia. EXAM: PORTABLE CHEST 1 VIEW COMPARISON:  08/01/2017, chest CT 07/31/2017, and earlier. FINDINGS: Portable AP semi upright view at 0456 hours. Endotracheal tube tip at the level the clavicles. Enteric tube courses to the abdomen, tip not included. Stable left IJ or subclavian central line. Substantially improved left lung ventilation since 07/31/2017, although residual moderate confluent left lung base opacity remains. Interval increasing opacity at the right lung base. No pneumothorax. Chronic but increased underlying bilateral pulmonary interstitial opacity compared to 2018, possibly mild  vascular congestion. Small bilateral pleural effusions are suspected. Stable cardiac size and mediastinal contours. Cardiomegaly and pericardial effusion noted on chest CT and CTA this month. Paucity of bowel gas in the upper  abdomen. IMPRESSION: 1.  Stable lines and tubes. 2. Improved left lung multilobar pneumonia since 07/31/2017, but moderate residual at the lung base. 3. Worsening ventilation at the right lung base over that same time suspicious for right lung involvement now. 4. Stable cardiomegaly.  Mild vascular congestion/pulmonary edema. 5. Small bilateral pleural effusions suspected. Electronically Signed   By: Odessa Fleming M.D.   On: 08/02/2017 11:00   Dg Chest Port 1 View  Result Date: 08/01/2017 CLINICAL DATA:  Respiratory failure EXAM: PORTABLE CHEST 1 VIEW COMPARISON:  Chest radiograph and chest CT July 31, 2017 FINDINGS: Endotracheal tube tip is 3.7 cm above the carina. Central catheter tip is in the superior vena cava. No pneumothorax. There has been significant partial clearing on the left compared to 1 day prior. There has been the significant clearing of diffuse consolidation and effusion. There remains atelectatic change in the left base as well as small left effusion. There has been a clearing of consolidation from the right upper lobe. Currently the right lung is clear except for atelectasis in the left base. There is cardiomegaly with pulmonary venous hypertension. No adenopathy evident. No bone lesions. IMPRESSION: Tube and catheter positions as described without pneumothorax. Significant clearing at the throughout the left lung and right upper lobe. There remains atelectatic change in each lower lung zone as well as underlying pulmonary vascular congestion. There is felt to be small left residual pleural effusion. Electronically Signed   By: Bretta Bang III M.D.   On: 08/01/2017 07:56   Dg Chest Port 1 View  Result Date: 07/31/2017 CLINICAL DATA:  Intubation and central line placement. EXAM: PORTABLE CHEST 1 VIEW COMPARISON:  Chest x-ray from same day at 0529. FINDINGS: Interval placement of an endotracheal tube with the tip approximately 1 cm above the level of the carina. Enteric tube seen entering  the stomach. Left subclavian central venous catheter with the tip at the cavoatrial junction. Stable cardiomegaly. Unchanged complete opacification of the left hemithorax due to large pleural effusion and left lung collapse. Increasing airspace disease within the right upper lobe. No acute osseous abnormality. IMPRESSION: 1. Interval placement of an endotracheal tube with the tip approximately 1 cm above the level of the carina. Recommend retraction 2-3 cm. 2. Appropriately positioned left subclavian central venous catheter. 3. Unchanged large left pleural effusion and left lung collapse. 4. Worsening airspace disease in the right upper lobe which could reflect edema or infection. Electronically Signed   By: Obie Dredge M.D.   On: 07/31/2017 10:51   Dg Chest Port 1 View  Result Date: 07/31/2017 CLINICAL DATA:  Acute respiratory failure EXAM: PORTABLE CHEST 1 VIEW COMPARISON:  07/30/2017 FINDINGS: Dense consolidation of the left hemithorax unchanged due to effusion and collapse. Progression of right lower lobe atelectasis/infiltrate. Diffuse airspace disease on the right may represent edema. IMPRESSION: Persistent collapse of the left lung Progressive right lower lobe atelectasis/infiltrate and small right effusion. Electronically Signed   By: Marlan Palau M.D.   On: 07/31/2017 07:26   Dg Abd Portable 1v  Result Date: 07/31/2017 CLINICAL DATA:  NG tube placement. EXAM: PORTABLE ABDOMEN - 1 VIEW COMPARISON:  None. FINDINGS: Enteric tube in place with the tip in the distal gastric body. Nonobstructive bowel gas pattern. No acute osseous abnormality. IMPRESSION: Enteric tube appropriately positioned  with the tip in the distal gastric body. Electronically Signed   By: Obie Dredge M.D.   On: 07/31/2017 10:52    Micro Results    Recent Results (from the past 240 hour(s))  Culture, respiratory (NON-Expectorated)     Status: None   Collection Time: 08/10/17  2:32 PM  Result Value Ref Range Status    Specimen Description TRACHEAL ASPIRATE  Final   Special Requests   Final    NONE Performed at [facility], [address] [place], [place] [address]    Gram Stain   Final    ABUNDANT WBC PRESENT, PREDOMINANTLY PMN RARE SQUAMOUS EPITHELIAL CELLS PRESENT MODERATE GRAM POSITIVE COCCI IN CHAINS    Culture   Final    FEW Consistent with normal respiratory flora. Performed at [facility], [address] [place], [place] [address]    Report Status 08/12/2017 FINAL  Final  Culture, respiratory (NON-Expectorated)     Status: None   Collection Time: 08/13/17  6:30 PM  Result Value Ref Range Status   Specimen Description   Final    TRACHEAL ASPIRATE Performed at [facility], [address] [place], [place] [address]    Special Requests   Final    NONE Performed at [facility], [address] [place], [place] [address]    Gram Stain   Final    RARE WBC PRESENT, PREDOMINANTLY PMN NO ORGANISMS SEEN    Culture   Final    Consistent with normal respiratory flora. Performed at [facility], [address], [place], [place] [address]    Report Status 08/16/2017 FINAL  Final  CULTURE, BLOOD (ROUTINE X 2) w Reflex to ID Panel     Status: None (Preliminary result)   Collection Time: 08/13/17  7:06 PM  Result Value Ref Range Status   Specimen Description BLOOD BLOOD RIGHT HAND  Final   Special Requests   Final    BOTTLES DRAWN AEROBIC AND ANAEROBIC Blood Culture results may not be optimal due to an inadequate volume of blood received in culture bottles   Culture   Final    NO GROWTH 4 DAYS Performed at [facility], [address] [place], [place] [address]    Report Status PENDING  Incomplete  CULTURE, BLOOD (ROUTINE X 2) w Reflex to ID Panel     Status: None (Preliminary result)   Collection Time: 08/13/17  7:13 PM  Result Value Ref Range Status   Specimen Description BLOOD BLOOD LEFT HAND  Final   Special Requests   Final     BOTTLES DRAWN AEROBIC AND ANAEROBIC Blood Culture adequate volume   Culture   Final    NO GROWTH 4 DAYS Performed at [facility], [address], [place], [place] [address]    Report Status PENDING  Incomplete       Today   Subjective:   [name] today has no headache,no chest abdominal pain,no new weakness tingling or numbness, feels much better wants to go home today.   Objective:   Blood pressure (!) 119/98, pulse 98, temperature 98.8 F (37.1 C), temperature source Oral, resp. rate 16, height 5' (1.524 m), weight 109.2 kg (240 lb 12.8 oz), SpO2 95 %.   Intake/Output Summary (Last 24 hours) at 08/17/2017 1054 Last data filed at 08/17/2017 0833 Gross per 24 hour  Intake 180 ml  Output -  Net 180 ml    Exam Awake Alert, Oriented x  3, No new F.N deficits, Normal affect [place].AT,PERRAL Supple Neck,No JVD, No cervical lymphadenopathy appriciated.  Symmetrical Chest wall movement, Good air movement bilaterally, CTAB RRR,No Gallops,Rubs or new Murmurs, No Parasternal Heave +ve B.Sounds, Abd Soft, Non tender, No organomegaly appriciated, No rebound -guarding or rigidity. No Cyanosis, Clubbing or edema, No new Rash or bruise  Data Review   CBC w Diff:  Lab Results  Component Value Date   WBC 6.7 08/16/2017   HGB 13.0 08/16/2017   HGB 15.9 03/26/2014   HCT 42.4 08/16/2017   HCT 48.5 (H) 03/26/2014   PLT 231 08/16/2017   PLT 303 03/26/2014   LYMPHOPCT 17 08/16/2017   LYMPHOPCT 21.1 09/10/2013   BANDSPCT 0 07/29/2017   MONOPCT 6 08/16/2017   MONOPCT 6.3 09/10/2013   EOSPCT 3 08/16/2017   EOSPCT 2.2 09/10/2013   BASOPCT 0 08/16/2017   BASOPCT 0.8 09/10/2013    CMP:  Lab Results  Component Value Date   NA 137 08/17/2017   NA 142 03/26/2014   K 3.7 08/17/2017   K 3.9 03/26/2014   CL 99 (L) 08/17/2017   CL 105 03/26/2014   CO2 31 08/17/2017   CO2 34 (H) 03/26/2014   BUN 8 08/17/2017   BUN 6 (L) 03/26/2014   CREATININE 0.55 08/17/2017    CREATININE 0.76 03/26/2014   PROT 7.7 08/13/2017   PROT 7.4 03/26/2014   ALBUMIN 3.5 08/13/2017   ALBUMIN 3.2 (L) 03/26/2014   BILITOT 1.8 (H) 08/13/2017   BILITOT 0.6 03/26/2014   ALKPHOS 68 08/13/2017   ALKPHOS 93 03/26/2014   AST 19 08/13/2017   AST 20 03/26/2014   ALT 34 08/13/2017   ALT 32 03/26/2014  .   Total Time in preparing paper work, data evaluation and todays exam - 35 minutes  Katha Hamming M.D on 08/17/2017 at 10:54 AM    Note: This dictation was prepared with Dragon dictation along with smaller phrase technology. Any transcriptional errors that result from this process are unintentional.

## 2017-08-17 NOTE — Progress Notes (Signed)
EMS called for transport.

## 2017-08-17 NOTE — Progress Notes (Signed)
Physical Therapy Treatment Patient Details Name: [name] MRN: [id number] DOB: February 03, 1966 Today's Date: 08/17/2017    History of Present Illness Pt is a 52 y.o. female presenting to hospital 07/29/17 with chest pain and increasing SOB.  Pt admitted to hospital with acute on chronic respiratory failure with hypoxia secondary acute on chronic diastolic CHF, pericardial effusion, pleural effusion, htn, and L sided PNA.  Transferred to CCU 07/30/17 and intubated 07/31/17 (very difficult airway and difficult to wean).  Pt extubated 08/14/17 and transferred to general medical floor.  PMH includes COPD (4 L home O2), htn, (+) smoking, anxiety.    PT Comments    Pt demonstrated bed mobility (CGA), transfers and ambulation (6 ft) (min to mod x 2 physical assist) (see mobility section for details). Pt continues to progress w/ technique for bed mobility, transfers, and ambulation; but persists with weakness and fatigue, requiring higher level of assist.  PT recommendations remain CIR at d/c, but Pt and family have refused rehab and are planning to d/c to home today. Therapist demonstrated and educated Pt and family in safe transfers, ambulation, and activity tolerance progressions; Pt and family verbalized good understanding. PT will continue to see Pt daily during current acute hospitalization to progress strength, transfers, ambulation, and activity tolerance.   Follow Up Recommendations  CIR     Equipment Recommendations  Rolling walker with 5" wheels;3in1 (PT);Wheelchair (measurements PT);Wheelchair cushion (measurements PT)    Recommendations for Other Services       Precautions / Restrictions Precautions Precautions: Fall Restrictions Weight Bearing Restrictions: No    Mobility  Bed Mobility Overal bed mobility: Needs Assistance Bed Mobility: Supine to Sit     Supine to sit: HOB elevated;Min guard     General bed mobility comments: required extra time and effort to move to EOB, CGA  for safety, w/ HOB elevated; vc's to scoot forward so that feet touch floor.  Transfers Overall transfer level: Needs assistance Equipment used: Rolling walker (2 wheeled) Transfers: Sit to/from Stand Sit to Stand: Min assist;Mod assist;Min guard;+2 physical assistance Stand pivot transfers: +2 physical assistance;Mod assist(stand step turn)       General transfer comment: min to mod assist to help lift by one therapist +2 CGA for safety, block L knee; vc's for hand placement on RW, push knees back and contract B quads for stability, stand up tall and bring hips forward, squeeze glutes; mod assist x 2 w/ stand step turn for stabilty, blocking left knee; vc's for stepping pattern  Ambulation/Gait Ambulation/Gait assistance: Mod assist;+2 physical assistance Ambulation Distance (Feet): 6 Feet Assistive device: Rolling walker (2 wheeled) Gait Pattern/deviations: Step-to pattern;Decreased step length - left;Decreased step length - right Gait velocity: decreased   General Gait Details: RW mod assist X2 for stability, blocking L knee, vc's for stepping pattern, stay focused to task, keep walker close, hold self up. Pt took 6 steps w/ chair follow; Pt stated she needed to urinated and BSC was brought up behind her for her to sit; Pt then stood and recliner was brought up behind her for her to sit.   Stairs            Wheelchair Mobility    Modified Rankin (Stroke Patients Only)       Balance Overall balance assessment: Needs assistance Sitting-balance support: No upper extremity supported;Feet supported Sitting balance-Leahy Scale: Good Sitting balance - Comments: demonstrated good stability sitting EOB when reaching within BOS to manage clothing.     Standing balance-Leahy Scale:  Poor Standing balance comment: required RW +2 min to CGA and L knee blocked for stability                            Cognition Arousal/Alertness: Awake/alert Behavior During Therapy:  WFL for tasks assessed/performed Overall Cognitive Status: Within Functional Limits for tasks assessed                                 General Comments: Pt A&O x 4      Exercises      General Comments  Pt agreeable to session      Pertinent Vitals/Pain Pain Assessment: No/denies pain Pain Score: 0-No pain Pain Intervention(s): Limited activity within patient's tolerance;Monitored during session;Repositioned    Home Living                      Prior Function            PT Goals (current goals can now be found in the care plan section) Acute Rehab PT Goals Patient Stated Goal: I want to get stronger PT Goal Formulation: With patient Time For Goal Achievement: 08/24/17 Potential to Achieve Goals: Fair Progress towards PT goals: Progressing toward goals    Frequency    7X/week      PT Plan Current plan remains appropriate    Co-evaluation              AM-PAC PT "6 Clicks" Daily Activity  Outcome Measure  Difficulty turning over in bed (including adjusting bedclothes, sheets and blankets)?: A Little Difficulty moving from lying on back to sitting on the side of the bed? : A Little Difficulty sitting down on and standing up from a chair with arms (e.g., wheelchair, bedside commode, etc,.)?: Unable Help needed moving to and from a bed to chair (including a wheelchair)?: Total Help needed walking in hospital room?: Total Help needed climbing 3-5 steps with a railing? : Total 6 Click Score: 10    End of Session Equipment Utilized During Treatment: Gait belt;Oxygen Activity Tolerance: Patient limited by fatigue Patient left: with call bell/phone within reach;in chair;with chair alarm set;with family/visitor present(B heels elevated by pillow) Nurse Communication: Mobility status PT Visit Diagnosis: Muscle weakness (generalized) (M62.81);Other abnormalities of gait and mobility (R26.89);Difficulty in walking, not elsewhere classified  (R26.2)     Time: 0915-1000 PT Time Calculation (min) (ACUTE ONLY): 45 min  Charges:                       G Codes:        Shamere Campas Mondrian-Pardue, SPT 08/17/2017, 10:26 AM

## 2017-08-17 NOTE — Progress Notes (Signed)
PICC pulled from left upper chest with no complications. Dressing in place. Discharge instructions reviewed with patient. EMS to transport home daughter aware.

## 2017-08-17 NOTE — Progress Notes (Signed)
SUBJECTIVE: Feeling well. No chest pain or shortness of breath. Preparing for discharge today.    Vitals:   08/17/17 0211 08/17/17 0300 08/17/17 0409 08/17/17 0813  BP:   131/82 (!) 119/98  Pulse: 74  86 83  Resp: 20  20 16   Temp:   98.4 F (36.9 C) 98.8 F (37.1 C)  TempSrc:   Oral Oral  SpO2: 94%  93% 96%  Weight:  240 lb 12.8 oz (109.2 kg)    Height:        Intake/Output Summary (Last 24 hours) at 08/17/2017 0858 Last data filed at 08/17/2017 [id number] Gross per 24 hour  Intake 180 ml  Output -  Net 180 ml    LABS: Basic Metabolic Panel: Recent Labs    08/15/17 0425 08/16/17 0511 08/17/17 0637  NA 145 139 137  K 3.4* 3.1* 3.7  CL 106 101 99*  CO2 32 32 31  GLUCOSE 106* 157* 114*  BUN 18 10 8   CREATININE 0.48 0.48 0.55  CALCIUM 9.2 9.0 9.1  MG 1.6* 1.6*  --   PHOS 3.9 3.5  --    Liver Function Tests: No results for input(s): AST, ALT, ALKPHOS, BILITOT, PROT, ALBUMIN in the last 72 hours. No results for input(s): LIPASE, AMYLASE in the last 72 hours. CBC: Recent Labs    08/15/17 0425 08/16/17 0511  WBC 7.2 6.7  NEUTROABS 5.1 5.0  HGB 13.0 13.0  HCT 43.1 42.4  MCV 79.3* 79.3*  PLT 247 231   Cardiac Enzymes: No results for input(s): CKTOTAL, CKMB, CKMBINDEX, TROPONINI in the last 72 hours. BNP: Invalid input(s): POCBNP D-Dimer: No results for input(s): DDIMER in the last 72 hours. Hemoglobin A1C: No results for input(s): HGBA1C in the last 72 hours. Fasting Lipid Panel: No results for input(s): CHOL, HDL, LDLCALC, TRIG, CHOLHDL, LDLDIRECT in the last 72 hours. Thyroid Function Tests: No results for input(s): TSH, T4TOTAL, T3FREE, THYROIDAB in the last 72 hours.  Invalid input(s): FREET3 Anemia Panel: No results for input(s): VITAMINB12, FOLATE, FERRITIN, TIBC, IRON, RETICCTPCT in the last 72 hours.   PHYSICAL EXAM General: Well developed, well nourished, in no acute distress, hoarse voice HEENT:  Normocephalic and atramatic Neck:  No JVD.  Lungs:  Clear bilaterally to auscultation and percussion. Heart: HRRR . Normal S1 and S2 without gallops or murmurs.  Abdomen: Bowel sounds are positive, abdomen soft and non-tender  Msk:  Back normal, normal gait. Normal strength and tone for age. Extremities: No clubbing, cyanosis or edema.   Neuro: Alert and oriented X 3. Psych:  Good affect, responds appropriately  TELEMETRY: NSR 84bpm  ASSESSMENT AND PLAN: Status post acute respiratory failure due to COPD and CHF exacerbation with prolonged intubation. Stable for discharge from cardiac perspective, may discharge with outpatient echo and cardiac follow up. Given [facility] for Tuesday 4/16 at 10am.   Principal Problem:   Acute on chronic respiratory failure with hypoxia (HCC) Active Problems:   Acute respiratory failure (HCC)   Pericardial effusion    Angel HammanKristin Lamoine Magallon, NP-C 08/17/2017 8:58 AM Cell: [phone]

## 2017-08-18 LAB — CULTURE, BLOOD (ROUTINE X 2)
CULTURE: NO GROWTH
Culture: NO GROWTH
Special Requests: ADEQUATE

## 2017-08-21 ENCOUNTER — Telehealth: Payer: Self-pay

## 2017-08-21 NOTE — Telephone Encounter (Signed)
EMMI Follow-up: It was noted on the report that patient had question of who to call if condition changes, unfilled Rx's and not able to get Rx's filled.  [facility] Mailbox was full so unable to leave a message.

## 2017-09-13 ENCOUNTER — Other Ambulatory Visit: Payer: Self-pay | Admitting: Cardiovascular Disease

## 2017-09-19 ENCOUNTER — Encounter: Payer: Self-pay | Admitting: Emergency Medicine

## 2017-09-19 ENCOUNTER — Other Ambulatory Visit: Payer: Self-pay

## 2017-09-19 ENCOUNTER — Observation Stay
Admission: EM | Admit: 2017-09-19 | Discharge: 2017-09-20 | Disposition: A | Discharge status: Home / Self Care | Payer: Medicaid Other | Attending: Internal Medicine | Admitting: Internal Medicine

## 2017-09-19 ENCOUNTER — Emergency Department: Payer: Medicaid Other

## 2017-09-19 DIAGNOSIS — E876 Hypokalemia: Secondary | ICD-10-CM | POA: Insufficient documentation

## 2017-09-19 DIAGNOSIS — R079 Chest pain, unspecified: Secondary | ICD-10-CM | POA: Diagnosis present

## 2017-09-19 DIAGNOSIS — F419 Anxiety disorder, unspecified: Secondary | ICD-10-CM | POA: Diagnosis not present

## 2017-09-19 DIAGNOSIS — R0789 Other chest pain: Secondary | ICD-10-CM | POA: Diagnosis not present

## 2017-09-19 DIAGNOSIS — I509 Heart failure, unspecified: Secondary | ICD-10-CM | POA: Insufficient documentation

## 2017-09-19 DIAGNOSIS — J449 Chronic obstructive pulmonary disease, unspecified: Secondary | ICD-10-CM | POA: Insufficient documentation

## 2017-09-19 DIAGNOSIS — Z6841 Body Mass Index (BMI) 40.0 and over, adult: Secondary | ICD-10-CM | POA: Diagnosis not present

## 2017-09-19 DIAGNOSIS — I513 Intracardiac thrombosis, not elsewhere classified: Secondary | ICD-10-CM | POA: Insufficient documentation

## 2017-09-19 DIAGNOSIS — I119 Hypertensive heart disease without heart failure: Secondary | ICD-10-CM | POA: Insufficient documentation

## 2017-09-19 DIAGNOSIS — F17210 Nicotine dependence, cigarettes, uncomplicated: Secondary | ICD-10-CM | POA: Diagnosis not present

## 2017-09-19 DIAGNOSIS — Z8249 Family history of ischemic heart disease and other diseases of the circulatory system: Secondary | ICD-10-CM | POA: Diagnosis not present

## 2017-09-19 DIAGNOSIS — Z79899 Other long term (current) drug therapy: Secondary | ICD-10-CM | POA: Insufficient documentation

## 2017-09-19 DIAGNOSIS — Z7901 Long term (current) use of anticoagulants: Secondary | ICD-10-CM | POA: Insufficient documentation

## 2017-09-19 DIAGNOSIS — Z7951 Long term (current) use of inhaled steroids: Secondary | ICD-10-CM | POA: Insufficient documentation

## 2017-09-19 MED ORDER — LIDOCAINE HCL (PF) 1 % IJ SOLN
INTRAMUSCULAR | Status: AC
  Administered 2017-09-20: 5 mL via INTRADERMAL
  Filled 2017-09-19: qty 5

## 2017-09-19 MED ORDER — CLONAZEPAM 0.5 MG PO TABS
0.5000 mg | ORAL_TABLET | Freq: Every day | ORAL | Status: DC
  Administered 2017-09-19 – 2017-09-20 (×2): 0.5 mg via ORAL
  Filled 2017-09-19 (×2): qty 1

## 2017-09-19 NOTE — ED Triage Notes (Addendum)
Pt to triage via w/c with no distress noted; O2 in place at 4l/min via [place]; pt reports mid CP, nonradiating, with no accomp symptoms; st hx of same with pneumonia; st pain increases with deep breathing; sched for TEE tomorrow

## 2017-09-19 NOTE — ED Notes (Signed)
Informed RN that patient has been roomed and is ready for evaluation.  Patient in NAD at this time and call bell placed within reach.   

## 2017-09-19 NOTE — ED Provider Notes (Signed)
[facility] Emergency Department Provider Note    First MD Initiated Contact with Patient 09/19/17 2145     (approximate)  I have reviewed the triage vital signs and the nursing notes.   HISTORY  Chief Complaint Chest Pain    HPI [name] is a 52 y.o. female history of COPD anxiety as well as LVH recent hospitalization complex and prolonged ICU stay for respiratory distress presents to the ER with chest pain shortness of breath particularly when taking deep inspiration.  Patient was previously on Xarelto and started on Coumadin this past week.  Did not receive Lovenox.  States it does not hurt when she takes a deep inspiration.  Patient is scheduled for TEE to be performed tomorrow follow-up with cardiology.  Denies any cough.  Pain is mid chest pain and pressure.  Says it feels somewhat similar to when she had pneumonia.  Past Medical History:  Diagnosis Date  . Anxiety   . COPD (chronic obstructive pulmonary disease) (HCC)   . Essential hypertension   . History of echocardiogram    a. 08/2013 Echo: EF 55-60%, impaired LV relaxation, mild LVH, nl RV fxn, nl RVSP, mild TR; b. 05/2017 Echo: EF 65-70%, no rwma, nl RV fxn.  . Morbid obesity (HCC)   . Tobacco abuse    Family History  Problem Relation Age of Onset  . Dementia Mother   . Heart attack Father    Past Surgical History:  Procedure Laterality Date  . ABDOMINAL HYSTERECTOMY    . CHOLECYSTECTOMY    . EXTUBATION (ENDOTRACHEAL) IN OR N/A 08/14/2017   Procedure: EXTUBATION (ENDOTRACHEAL) IN OR;  Surgeon: Geanie Logan, MD;  Location: [facility] ORS;  Service: ENT;  Laterality: N/A;  . HERNIA REPAIR     Patient Active Problem List   Diagnosis Date Noted  . Pericardial effusion   . Acute on chronic respiratory failure with hypoxia (HCC) 07/29/2017  . Acute respiratory failure (HCC) 07/29/2017  . PNA (pneumonia) 06/07/2017      Prior to Admission medications   Medication Sig Start Date End Date  Taking? Authorizing Provider  albuterol (PROVENTIL HFA;VENTOLIN HFA) 108 (90 Base) MCG/ACT inhaler Inhale 2 puffs into the lungs every 6 (six) hours as needed for wheezing or shortness of breath. 06/12/17   Gouru, Aruna, MD  budesonide (PULMICORT) 0.5 MG/2ML nebulizer solution Take 2 mLs (0.5 mg total) by nebulization 2 (two) times daily. 08/17/17   Katha Hamming, MD  clonazePAM (KLONOPIN) 0.5 MG tablet Take 0.5 mg by mouth 3 (three) times daily as needed for anxiety.     [provider]  cyclobenzaprine (FLEXERIL) 10 MG tablet Take 10 mg by mouth 3 (three) times daily as needed for muscle spasms.  08/16/16   [provider]  fluticasone (FLOVENT HFA) 44 MCG/ACT inhaler Inhale 2 puffs into the lungs daily.    [provider]  furosemide (LASIX) 20 MG tablet Take 1 tablet (20 mg total) by mouth daily. 08/17/17 08/17/18  Katha Hamming, MD  ipratropium-albuterol (DUONEB) 0.5-2.5 (3) MG/3ML SOLN Take 3 mLs by nebulization every 6 (six) hours. Patient taking differently: Take 3 mLs by nebulization 2 (two) times daily.  08/17/17   Katha Hamming, MD  omeprazole (PRILOSEC) 20 MG capsule Take 20 mg by mouth daily.    [provider]  protein supplement shake (PREMIER PROTEIN) LIQD Take 325 mLs (11 oz total) by mouth 2 (two) times daily between meals. Patient not taking: Reported on 09/17/2017 08/17/17  Katha Hamming, MD  senna-docusate (SENOKOT-S) 8.6-50 MG tablet Take 1 tablet by mouth at bedtime as needed for mild constipation. Patient not taking: Reported on 09/17/2017 06/12/17   Ramonita Lab, MD  sertraline (ZOLOFT) 50 MG tablet Take 1 tablet (50 mg total) by mouth at bedtime. Patient not taking: Reported on 09/17/2017 08/17/17   Katha Hamming, MD  warfarin (COUMADIN) 3 MG tablet Take 3 mg by mouth daily.    [provider]    Allergies Patient has no known allergies.    Social History Social History   Tobacco Use  . Smoking status:  Current Every Day Smoker    Packs/day: 0.75    Years: 40.00    Pack years: 30.00    Types: Cigarettes  . Smokeless tobacco: Never Used  Substance Use Topics  . Alcohol use: No  . Drug use: No    Review of Systems Patient denies headaches, rhinorrhea, blurry vision, numbness, shortness of breath, chest pain, edema, cough, abdominal pain, nausea, vomiting, diarrhea, dysuria, fevers, rashes or hallucinations unless otherwise stated above in HPI. ____________________________________________   PHYSICAL EXAM:  VITAL SIGNS: Vitals:   09/19/17 2126 09/19/17 2325  BP: (!) 92/46 128/74  Pulse: (!) 116 99  Resp: (!) 22   Temp: 98.4 F (36.9 C)   SpO2: 99% 98%    Constitutional: Alert and oriented. Well appearing and in no acute distress. Eyes: Conjunctivae are normal.  Head: Atraumatic. Nose: No congestion/rhinnorhea. Mouth/Throat: Mucous membranes are moist.   Neck: No stridor. Painless ROM.  Cardiovascular: mild tachycardia, regular rhythm. Grossly normal heart sounds.  Good peripheral circulation. Respiratory: Normal respiratory effort.  No retractions. Lungs diminished in bilateral bases Gastrointestinal: Soft and nontender. No distention. No abdominal bruits. No CVA tenderness. Genitourinary:  Musculoskeletal: No lower extremity tenderness nor edema.  No joint effusions. Neurologic:  Normal speech and language. No gross focal neurologic deficits are appreciated. No facial droop Skin:  Skin is warm, dry and intact. No rash noted. Psychiatric: Mood and affect are normal. Speech and behavior are normal.  ____________________________________________   LABS (all labs ordered are listed, but only abnormal results are displayed)  No results found for this or any previous visit (from the past 24 hour(s)). ____________________________________________  EKG My review and personal interpretation at Time: 21:32   Indication: chest pain  Rate: 110  Rhythm: sinus Axis: normal Other:  st flattening compared to previous, no stemi ____________________________________________  RADIOLOGY  I personally reviewed all radiographic images ordered to evaluate for the above acute complaints and reviewed radiology reports and findings.  These findings were personally discussed with the patient.  Please see medical record for radiology report.  ____________________________________________   PROCEDURES  Procedure(s) performed:  Procedures  Due to difficulty with obtaining IV access, a 20G peripheral IV catheter was inserted using US guidance into the right arm.  The site was prepped with chlorhexidine and allowed to dry.  The patient tolerated the procedure without any complications.   Critical Care performed: no ____________________________________________   INITIAL IMPRESSION / ASSESSMENT AND PLAN / ED COURSE  Pertinent labs & imaging results that were available during my care of the patient were reviewed by me and considered in my medical decision making (see chart for details).  DDX: Asthma, copd, CHF, pna, ptx, malignancy, Pe, anemia   [name] is a 52 y.o. who presents to the ED with symptoms as described above.  Seems slightly atypical for ACS but patient does have significant risk factors.  No  respiratory distress but does describe some pleuritic pain.  Patient is on Coumadin but was not bridged.  May have been appropriately covered with Xarelto.  Will need to check INR.  Still waiting on labs as the patient was difficult IV stick but based on her presentation and felt that she is having an outpatient TEE performed tomorrow I do believe patient will benefit from hospitalization for rule out chest pain.  Have discussed with the patient and available family all diagnostics and treatments performed thus far and all questions were answered to the best of my ability. The patient demonstrates understanding and agreement with plan.       As part of my medical decision  making, I reviewed the following data within the electronic MEDICAL RECORD NUMBER Nursing notes reviewed and incorporated, Labs reviewed, notes from prior ED visits.   ____________________________________________   FINAL CLINICAL IMPRESSION(S) / ED DIAGNOSES  Final diagnoses:  Chest pain, unspecified type      NEW MEDICATIONS STARTED DURING THIS VISIT:  New Prescriptions   No medications on file     Note:  This document was prepared using Dragon voice recognition software and may include unintentional dictation errors.    Willy Eddy, MD 09/20/17 Marlyne Beards

## 2017-09-20 ENCOUNTER — Other Ambulatory Visit: Payer: Self-pay

## 2017-09-20 ENCOUNTER — Encounter
Admission: EM | Disposition: A | Discharge status: Home / Self Care | Payer: Self-pay | Source: Home / Self Care | Attending: Student in an Organized Health Care Education/Training Program

## 2017-09-20 ENCOUNTER — Observation Stay
Admit: 2017-09-20 | Discharge: 2017-09-20 | Disposition: A | Discharge status: Home / Self Care | Payer: Medicaid Other | Attending: Registered Nurse | Admitting: Registered Nurse

## 2017-09-20 ENCOUNTER — Ambulatory Visit: Admit: 2017-09-20 | Payer: Self-pay | Admitting: Cardiovascular Disease

## 2017-09-20 ENCOUNTER — Encounter: Payer: Self-pay | Admitting: Internal Medicine

## 2017-09-20 ENCOUNTER — Ambulatory Visit: Admission: RE | Admit: 2017-09-20 | Payer: Self-pay | Source: Ambulatory Visit | Admitting: Cardiovascular Disease

## 2017-09-20 DIAGNOSIS — R079 Chest pain, unspecified: Secondary | ICD-10-CM | POA: Diagnosis present

## 2017-09-20 HISTORY — PX: TEE WITHOUT CARDIOVERSION: SHX5443

## 2017-09-20 LAB — BASIC METABOLIC PANEL
ANION GAP: 11 (ref 5–15)
CO2: 32 mmol/L (ref 22–32)
Calcium: 8 mg/dL — ABNORMAL LOW (ref 8.9–10.3)
Chloride: 95 mmol/L — ABNORMAL LOW (ref 101–111)
Creatinine, Ser: 0.62 mg/dL (ref 0.44–1.00)
GFR calc Af Amer: >60 mL/min (ref >60)
GFR calc non Af Amer: >60 mL/min (ref >60)
GLUCOSE: 95 mg/dL (ref 65–99)
Potassium: 3.2 mmol/L — ABNORMAL LOW (ref 3.5–5.1)
Sodium: 138 mmol/L (ref 135–145)

## 2017-09-20 LAB — CBC
HCT: 39 % (ref 35.0–47.0)
HEMOGLOBIN: 12.3 g/dL (ref 12.0–16.0)
MCH: 25.2 pg — ABNORMAL LOW (ref 26.0–34.0)
MCHC: 31.6 g/dL — AB (ref 32.0–36.0)
MCV: 79.8 fL — AB (ref 80.0–100.0)
Platelets: 310 10*3/uL (ref 150–440)
RBC: 4.89 MIL/uL (ref 3.80–5.20)
RDW: 20.5 % — ABNORMAL HIGH (ref 11.5–14.5)
WBC: 11.9 10*3/uL — ABNORMAL HIGH (ref 3.6–11.0)

## 2017-09-20 LAB — TROPONIN I: Troponin I: <0.03 ng/mL (ref <0.03)

## 2017-09-20 LAB — LIPID PANEL
CHOL/HDL RATIO: 4.7 ratio
CHOLESTEROL: 174 mg/dL (ref 0–200)
HDL: 37 mg/dL — AB (ref >40)
LDL Cholesterol: 112 mg/dL — ABNORMAL HIGH (ref 0–99)
Triglycerides: 126 mg/dL (ref <150)
VLDL: 25 mg/dL (ref 0–40)

## 2017-09-20 LAB — APTT: aPTT: 39 seconds — ABNORMAL HIGH (ref 24–36)

## 2017-09-20 LAB — MAGNESIUM: Magnesium: 1.4 mg/dL — ABNORMAL LOW (ref 1.7–2.4)

## 2017-09-20 LAB — BRAIN NATRIURETIC PEPTIDE: B Natriuretic Peptide: 36 pg/mL (ref 0.0–100.0)

## 2017-09-20 LAB — PROTIME-INR
INR: 1.24
Prothrombin Time: 15.5 seconds — ABNORMAL HIGH (ref 11.4–15.2)

## 2017-09-20 SURGERY — ECHOCARDIOGRAM, TRANSESOPHAGEAL
Anesthesia: Moderate Sedation

## 2017-09-20 MED ORDER — MAGNESIUM SULFATE 2 GM/50ML IV SOLN
2.0000 g | Freq: Once | INTRAVENOUS | Status: AC
  Administered 2017-09-20: 2 g via INTRAVENOUS
  Filled 2017-09-20: qty 50

## 2017-09-20 MED ORDER — ORAL CARE MOUTH RINSE: 15.0000 mL | Freq: Two times a day (BID) | OROMUCOSAL | Status: DC

## 2017-09-20 MED ORDER — WARFARIN SODIUM 4 MG PO TABS
4.0000 mg | ORAL_TABLET | Freq: Every day | ORAL | Status: DC
  Filled 2017-09-20: qty 1

## 2017-09-20 MED ORDER — SODIUM CHLORIDE 0.9 % IV SOLN
INTRAVENOUS | Status: DC
  Filled 2017-09-20: qty 1000

## 2017-09-20 MED ORDER — FENTANYL CITRATE (PF) 100 MCG/2ML IJ SOLN
INTRAMUSCULAR | Status: AC | PRN
  Administered 2017-09-20: 50 ug via INTRAVENOUS

## 2017-09-20 MED ORDER — BUTAMBEN-TETRACAINE-BENZOCAINE 2-2-14 % EX AERO
INHALATION_SPRAY | CUTANEOUS | Status: AC
  Filled 2017-09-20: qty 5

## 2017-09-20 MED ORDER — LIDOCAINE HCL (PF) 1 % IJ SOLN
5.0000 mL | Freq: Once | INTRAMUSCULAR | Status: AC
  Administered 2017-09-20: 5 mL via INTRADERMAL

## 2017-09-20 MED ORDER — FUROSEMIDE 40 MG PO TABS
20.0000 mg | ORAL_TABLET | Freq: Every day | ORAL | Status: DC
  Administered 2017-09-20: 20 mg via ORAL
  Filled 2017-09-20: qty 1

## 2017-09-20 MED ORDER — MORPHINE SULFATE (PF) 4 MG/ML IV SOLN: 4.0000 mg | INTRAVENOUS | Status: DC | PRN

## 2017-09-20 MED ORDER — LIDOCAINE VISCOUS 2 % MT SOLN
OROMUCOSAL | Status: AC
  Filled 2017-09-20: qty 15

## 2017-09-20 MED ORDER — MOMETASONE FURO-FORMOTEROL FUM 200-5 MCG/ACT IN AERO: 2.0000 | INHALATION_SPRAY | Freq: Two times a day (BID) | RESPIRATORY_TRACT | Status: DC

## 2017-09-20 MED ORDER — ASPIRIN 81 MG PO CHEW
81.0000 mg | CHEWABLE_TABLET | Freq: Every day | ORAL | Status: DC
  Administered 2017-09-20: 81 mg via ORAL
  Filled 2017-09-20: qty 1

## 2017-09-20 MED ORDER — BISACODYL 5 MG PO TBEC: 5.0000 mg | DELAYED_RELEASE_TABLET | Freq: Every day | ORAL | Status: DC | PRN

## 2017-09-20 MED ORDER — MIDAZOLAM HCL 5 MG/5ML IJ SOLN
INTRAMUSCULAR | Status: AC
  Filled 2017-09-20: qty 5

## 2017-09-20 MED ORDER — PANTOPRAZOLE SODIUM 40 MG PO TBEC
40.0000 mg | DELAYED_RELEASE_TABLET | Freq: Every day | ORAL | Status: DC
  Administered 2017-09-20: 40 mg via ORAL
  Filled 2017-09-20: qty 1

## 2017-09-20 MED ORDER — SENNOSIDES-DOCUSATE SODIUM 8.6-50 MG PO TABS: 1.0000 | ORAL_TABLET | Freq: Every evening | ORAL | Status: DC | PRN

## 2017-09-20 MED ORDER — BUDESONIDE 0.5 MG/2ML IN SUSP: 0.5000 mg | Freq: Two times a day (BID) | RESPIRATORY_TRACT | Status: DC

## 2017-09-20 MED ORDER — POTASSIUM CHLORIDE CRYS ER 20 MEQ PO TBCR
40.0000 meq | EXTENDED_RELEASE_TABLET | Freq: Two times a day (BID) | ORAL | Status: DC
  Administered 2017-09-20: 40 meq via ORAL
  Filled 2017-09-20: qty 2

## 2017-09-20 MED ORDER — IPRATROPIUM-ALBUTEROL 0.5-2.5 (3) MG/3ML IN SOLN: 3.0000 mL | Freq: Four times a day (QID) | RESPIRATORY_TRACT | Status: DC | PRN

## 2017-09-20 MED ORDER — CYCLOBENZAPRINE HCL 10 MG PO TABS: 10.0000 mg | ORAL_TABLET | Freq: Three times a day (TID) | ORAL | Status: DC | PRN

## 2017-09-20 MED ORDER — SODIUM CHLORIDE FLUSH 0.9 % IV SOLN
INTRAVENOUS | Status: AC
  Filled 2017-09-20: qty 10

## 2017-09-20 MED ORDER — ACETAMINOPHEN 325 MG PO TABS: 650.0000 mg | ORAL_TABLET | ORAL | Status: DC | PRN

## 2017-09-20 MED ORDER — WARFARIN - PHYSICIAN DOSING INPATIENT: Freq: Every day | Status: DC

## 2017-09-20 MED ORDER — FLUTICASONE FUROATE-VILANTEROL 200-25 MCG/INH IN AEPB
1.0000 | INHALATION_SPRAY | Freq: Every day | RESPIRATORY_TRACT | Status: DC
  Filled 2017-09-20: qty 28

## 2017-09-20 MED ORDER — ONDANSETRON HCL 4 MG/2ML IJ SOLN: 4.0000 mg | Freq: Four times a day (QID) | INTRAMUSCULAR | Status: DC | PRN

## 2017-09-20 MED ORDER — FENTANYL CITRATE (PF) 100 MCG/2ML IJ SOLN
INTRAMUSCULAR | Status: AC
  Filled 2017-09-20: qty 2

## 2017-09-20 MED ORDER — MORPHINE SULFATE (PF) 2 MG/ML IV SOLN: 2.0000 mg | INTRAVENOUS | Status: DC | PRN

## 2017-09-20 MED ORDER — MIDAZOLAM HCL 2 MG/2ML IJ SOLN
INTRAMUSCULAR | Status: AC | PRN
  Administered 2017-09-20: 1 mg via INTRAVENOUS

## 2017-09-20 MED ORDER — ENOXAPARIN SODIUM 120 MG/0.8ML ~~LOC~~ SOLN
1.0000 mg/kg | Freq: Two times a day (BID) | SUBCUTANEOUS | Status: DC
  Filled 2017-09-20 (×2): qty 0.8

## 2017-09-20 MED ORDER — CLONAZEPAM 0.5 MG PO TABS: 0.5000 mg | ORAL_TABLET | Freq: Three times a day (TID) | ORAL | Status: DC | PRN

## 2017-09-20 NOTE — H&P (Signed)
[facility] -  at [facility]   PATIENT NAME: [name]    MR#:  [id number]  DATE OF BIRTH:  [birth year]  DATE OF ADMISSION:  09/19/2017  PRIMARY CARE PHYSICIAN: Cathlean Marseilles, MD   REQUESTING/REFERRING PHYSICIAN:  Willy Eddy, MD   CHIEF COMPLAINT:   Chief Complaint  Patient presents with  . Chest Pain    HISTORY OF PRESENT ILLNESS:  [name]  is a 52 y.o. female with a known history of morbid obesity, HTN, COPD (2L [place] home O2), chronic diastolic CHF (mild diastolic dysfxn per 07/30/2017 Echo), mural thrombus (Coumadin), anxiety, Hx tobacco use D/O p/w 1d Hx CP. Pt has a complicated medical history, w/ recent prolonged hospitalization (from 07/29/2017 to 08/17/2017, 2/2 COPD/hypoxemic respiratory failure and pneumonia necessitating intubation). 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 She tells me she was on Xarelto up until approximately last Wednesday (09/12/2017), at which time she states she was told to stop taking Xarelto and start taking Coumadin. Based on her description, it would appear she is not being bridged. She states she has been on Coumadin for the past 8d, and has been taking it consistently (daily). INR is 1.24 on present admission. Pt states that she sees Cardiologist Dr. Welton Flakes (and her NP, Tomasa Rand). She states she is scheduled to have a TEE on Thursday 09/20/2017, but is to be observed in the hospital for chest pain.  Pt states that she was, "up and out" today, and went with her boyfriend for his preadmission testing prior to a procedure. She states they went home afterwards, and she was feeling well throughout most of the day. She states that @~1630-1700PM, she and her boyfriend were sitting outside in the back yard, "watching the grandbabies play," when she states she took a deep inspiration and experienced chest pain. She describes the chest pain as a deep midchest discomfort, which she  characterizes more as a soreness than as a pressure. It was reported to me that pt had previously described the sensation as that of "something sitting on her chest", but when I ask her this question directly, she denies it. She states she was not experiencing any discomfort when breathing normally, and only when she was taking a deep inspiration). She states the pain was non-radiating, non-positional, non-pleuritic, non-exertional and non-reproducible w/ chest wall palpation (though, again, the pain would occur consistently on deep breath). She denies SOB (worsening from baseline), cough, hemoptysis or wheezing. She denies associated palpitations, diaphoresis, N/V, LH/LOC. She states she sat and ignored the pain for 1.5-2hrs, hoping it would improve spontaneously. She states it did not. She normally wears oxygen (2L [place]), but states she was not wearing it outside. She thought that may have been the cause of her symptoms, so she went back inside and put her oxygen back on. Her symptoms did not improve. She discussed the situation with her boyfriend, and then asked him to drive her to [facility] ED for evaluation. The pt states that she has been very anxious about her TEE (scheduled for Thursday 09/20/2017). She states she was given anxiety medication in the ED, which improved her chest pain. She states she is not experiencing any pain on deep inspiration at the time of my Hx/examination, and her symptoms have resolved. She is w/o complaint, and states she feels "great" (in her words).  Pt denies F/C/N/V/D/AP, palpitations, diaphoresis, night sweats, rigors, weight loss, HA, vertigo, blurred vision, urinary symptoms. She is well-appearing  and in no acute distress. The pt states she has not had a stress test in the past. She is uninsured. She states a stress test was scheduled, but cancelled due to her not being insured. She is presently applying for emergency Medicaid. She does not have a Pulmonologist (for the same  reason). She has multiple cardiac risk factors, endorses a family history of cardiac disease (in her mother and father), and is a former smoker (up until 07/2017).  PAST MEDICAL HISTORY:   Past Medical History:  Diagnosis Date  . Anxiety   . COPD (chronic obstructive pulmonary disease) (HCC)   . Essential hypertension   . History of echocardiogram    a. 08/2013 Echo: EF 55-60%, impaired LV relaxation, mild LVH, nl RV fxn, nl RVSP, mild TR; b. 05/2017 Echo: EF 65-70%, no rwma, nl RV fxn.  . Morbid obesity (HCC)   . Tobacco abuse     PAST SURGICAL HISTORY:   Past Surgical History:  Procedure Laterality Date  . ABDOMINAL HYSTERECTOMY    . CHOLECYSTECTOMY    . EXTUBATION (ENDOTRACHEAL) IN OR N/A 08/14/2017   Procedure: EXTUBATION (ENDOTRACHEAL) IN OR;  Surgeon: Geanie Logan, MD;  Location: [facility] ORS;  Service: ENT;  Laterality: N/A;  . HERNIA REPAIR      SOCIAL HISTORY:   Social History   Tobacco Use  . Smoking status: Current Every Day Smoker    Packs/day: 0.75    Years: 40.00    Pack years: 30.00    Types: Cigarettes  . Smokeless tobacco: Never Used  Substance Use Topics  . Alcohol use: No    FAMILY HISTORY:   Family History  Problem Relation Age of Onset  . Dementia Mother   . Heart attack Father     DRUG ALLERGIES:  No Known Allergies  REVIEW OF SYSTEMS:   Review of Systems  Constitutional: Negative for chills, diaphoresis, fever, malaise/fatigue and weight loss.  HENT: Negative for congestion, ear pain, hearing loss, nosebleeds, sinus pain, sore throat and tinnitus.   Eyes: Negative for blurred vision, double vision and photophobia.  Respiratory: Negative for cough, hemoptysis, sputum production, shortness of breath and wheezing.   Cardiovascular: Positive for chest pain. Negative for palpitations, orthopnea, claudication, leg swelling and PND.  Gastrointestinal: Negative for abdominal pain, blood in stool, constipation, diarrhea, heartburn, melena, nausea and  vomiting.  Genitourinary: Negative for dysuria, frequency, hematuria and urgency.  Musculoskeletal: Negative for back pain, joint pain, myalgias and neck pain.  Skin: Negative for itching and rash.  Neurological: Negative for dizziness, tingling, tremors, sensory change, speech change, focal weakness, seizures, loss of consciousness, weakness and headaches.   MEDICATIONS AT HOME:   Prior to Admission medications   Medication Sig Start Date End Date Taking? Authorizing Provider  albuterol (PROVENTIL HFA;VENTOLIN HFA) 108 (90 Base) MCG/ACT inhaler Inhale 2 puffs into the lungs every 6 (six) hours as needed for wheezing or shortness of breath. 06/12/17  Yes Gouru, Aruna, MD  budesonide (PULMICORT) 0.5 MG/2ML nebulizer solution Take 2 mLs (0.5 mg total) by nebulization 2 (two) times daily. 08/17/17  Yes Katha Hamming, MD  clonazePAM (KLONOPIN) 0.5 MG tablet Take 0.5 mg by mouth 3 (three) times daily as needed for anxiety.    Yes [provider]  cyclobenzaprine (FLEXERIL) 10 MG tablet Take 10 mg by mouth 3 (three) times daily as needed for muscle spasms.  08/16/16  Yes [provider]  fluticasone (FLOVENT HFA) 44 MCG/ACT inhaler Inhale 2 puffs into the lungs daily.  Yes [provider]  furosemide (LASIX) 20 MG tablet Take 1 tablet (20 mg total) by mouth daily. 08/17/17 08/17/18 Yes Katha Hamming, MD  ipratropium-albuterol (DUONEB) 0.5-2.5 (3) MG/3ML SOLN Take 3 mLs by nebulization every 6 (six) hours. Patient taking differently: Take 3 mLs by nebulization 2 (two) times daily.  08/17/17  Yes Katha Hamming, MD  omeprazole (PRILOSEC) 20 MG capsule Take 20 mg by mouth daily.   Yes [provider]  warfarin (COUMADIN) 3 MG tablet Take 3 mg by mouth daily.   Yes [provider]  protein supplement shake (PREMIER PROTEIN) LIQD Take 325 mLs (11 oz total) by mouth 2 (two) times daily between meals. Patient not taking: Reported on 09/17/2017 08/17/17    Katha Hamming, MD  senna-docusate (SENOKOT-S) 8.6-50 MG tablet Take 1 tablet by mouth at bedtime as needed for mild constipation. Patient not taking: Reported on 09/17/2017 06/12/17   Ramonita Lab, MD  sertraline (ZOLOFT) 50 MG tablet Take 1 tablet (50 mg total) by mouth at bedtime. Patient not taking: Reported on 09/17/2017 08/17/17   Katha Hamming, MD      VITAL SIGNS:  Blood pressure 137/68, pulse 100, temperature 98.4 F (36.9 C), temperature source Oral, resp. rate 17, height 5' (1.524 m), weight 110.2 kg (243 lb), SpO2 97 %.  PHYSICAL EXAMINATION:  Physical Exam  Constitutional: She is oriented to person, place, and time. She appears well-developed and well-nourished. She is active and cooperative.  Non-toxic appearance. She does not have a sickly appearance. She does not appear ill. No distress.  HENT:  Head: Normocephalic and atraumatic.  Mouth/Throat: No oropharyngeal exudate.  Eyes: Pupils are equal, round, and reactive to light. Conjunctivae, EOM and lids are normal. No scleral icterus.  Neck: Neck supple. No JVD present. No thyromegaly present.  Cardiovascular: Normal rate, regular rhythm, S1 normal and S2 normal.  No extrasystoles are present. Exam reveals distant heart sounds (2/2 body habitus). Exam reveals no gallop, no S3, no S4 and no friction rub.  No murmur heard.  No systolic murmur is present.  No diastolic murmur is present. Pulmonary/Chest: Effort normal. No accessory muscle usage or stridor. No apnea, no tachypnea and no bradypnea. No respiratory distress. She has decreased breath sounds (2/2 body habitus) in the right upper field, the right middle field, the right lower field, the left upper field, the left middle field and the left lower field. She has no wheezes. She has no rhonchi. She has no rales.  Abdominal: Soft. Bowel sounds are normal. She exhibits no distension. There is no tenderness. There is no rebound and no guarding.  Musculoskeletal: Normal  range of motion. She exhibits no edema or tenderness.       Right lower leg: She exhibits no tenderness and no edema.       Left lower leg: She exhibits no tenderness and no edema.  Lymphadenopathy:    She has no cervical adenopathy.  Neurological: She is alert and oriented to person, place, and time. She is not disoriented.  Skin: Skin is warm, dry and intact. No rash noted. She is not diaphoretic. No erythema.  Psychiatric: She has a normal mood and affect. Her speech is normal and behavior is normal. Judgment and thought content normal. Her mood appears not anxious. She is not agitated. Cognition and memory are normal.   LABORATORY PANEL:   CBC Recent Labs  Lab 09/19/17 2324  WBC 11.9*  HGB 12.3  HCT 39.0  PLT 310   ------------------------------------------------------------------------------------------------------------------  Chemistries  Recent Labs  Lab 09/19/17 2324  NA 138  K 3.2*  CL 95*  CO2 32  GLUCOSE 95  BUN <5*  CREATININE 0.62  CALCIUM 8.0*   ------------------------------------------------------------------------------------------------------------------  Cardiac Enzymes Recent Labs  Lab 09/19/17 2324  TROPONINI <0.03   ------------------------------------------------------------------------------------------------------------------  RADIOLOGY:  Dg Chest 2 View  Result Date: 09/19/2017 CLINICAL DATA:  Chest pain. EXAM: CHEST - 2 VIEW COMPARISON:  08/16/2017. FINDINGS: Port-A-Cath has been removed. The heart remains enlarged. There is mild vascular congestion, without consolidation or edema. No effusion or pneumothorax. No osseous findings. Increased body habitus. Slight improvement aeration compared with priors. IMPRESSION: Cardiomegaly with mild vascular congestion. No consolidation or edema. Electronically Signed   By: Elsie Stain M.D.   On: 09/19/2017 21:58   IMPRESSION AND PLAN:   A/P: 53F CP.  1.) CP: Pt p/w 1d Hx atypical CP,  non-pleuritic but only occurring on deep inspiration, resolved in ED w/ anxiety medication. Though CP characteristics not immediately suggestive of coronary vaso-occlusive disease w/ ischemic CP, pt w/ multiple cardiac risk factors, including (+) FHx, morbid obesity, HTN, tobacco use. (-) SOB. VSS. Cardiopulmonary examination largely unimpressive. CXR (+) cardiomegaly w/ mild vascular congestion, (-) consolidation, (-) edema, w/ no mention of effusion. EKG STach @ 111bpm, (+) artifact, (+) non-specific T-wave changes, (-) acute ST elevation/depression. Trop-I (-) x1, 2x rpt pending. ASA81 qD. Not on Statin, lipid panel pending. Not ordered for stress test. Resumed Coumadin, bridging w/ Lovenox ( /kg BID) until otherwise specified. Will consult Cardiology for recommendations regarding CP, anticoagulation (w/ vs. w/o bridging), TEE. O2, Morphine, NTG. Tele, cardiac monitoring.  2.) Mural thrombus: On Coumadin, INR 1.24 after 8d. Dose increased to . Bridging w/ Lovenox ( /kg BID). Cardiology consult for anticoagulation recommendations.  3.) Hypokalemia: K+ 3.2. Magnesium level pending. Replete, monitor BMP.  4.) HTN/CHF: ASA81 as above. C/w Coumadin, Lasix. Not on beta blocker, ACE-I/ARB.  5.) COPD: DuoNebs PRN. C/w Dulera. On three inhaled steroids at home (Budesonide nebs + Flovent + Dulera), will need outpt Pulmonology f/up for COPD/medication mgmt.  6.) FEN/GI: Cardiac diet, Protonix (FS Omeprazole).  7.) DVT PPx: Full-dose therapeutic anticoagulation, Coumadin + Lovenox (as above).  8.) Code status: Full code.  9.) Disposition: Observation, pt expected to stay < 2 midnights.   All the records are reviewed and case discussed with ED provider. Management plans discussed with the patient, family and they are in agreement.  CODE STATUS: Full code.  TOTAL TIME TAKING CARE OF THIS PATIENT: 90 minutes.    Barbaraann Rondo M.D on 09/20/2017 at 3:22 AM  Between 7am to 6pm - Pager -  [phone]  After 6pm go to [email or web address] - Social research officer, government  [facility]  Office  [phone]  CC: Primary care physician; Cathlean Marseilles, MD   Note: This dictation was prepared with Dragon dictation along with smaller phrase technology. Any transcriptional errors that result from this process are unintentional.

## 2017-09-20 NOTE — Plan of Care (Signed)
  Problem: Coping: Goal: Level of anxiety will decrease Outcome: Progressing   Problem: Pain Managment: Goal: General experience of comfort will improve Outcome: Progressing   Problem: Safety: Goal: Ability to remain free from injury will improve Outcome: Progressing   Admitted from the ED with chest pain & anxiety. Chest pain free for me this shift. Tele box [id number], skin intact, verified by takera nesbitt, RN. Pt scheduled for TEE today @ 11:30.

## 2017-09-20 NOTE — Progress Notes (Signed)
Refused bed alarm. Educated on safety. Husband at bedside

## 2017-09-20 NOTE — Progress Notes (Signed)
Dr. Elpidio Anis paged regarding low magnesium.

## 2017-09-20 NOTE — Discharge Instructions (Signed)
Resume diet and activity as before ° ° °

## 2017-09-20 NOTE — Consult Note (Signed)
[name] is a 52 y.o. female  [id number]  Primary Cardiologist: Adrian Blackwater Reason for Consultation: chest pain  HPI: 3 YOF presented to [facility] with chest pain and was schedualed for TEE today. She had echo which showed normal LVEF 58%, with mild MR/TR and opacity in LA near atrial appendage ? Thrombus and will do TEE to confirm.  Review of Systems: has chest pain   Past Medical History:  Diagnosis Date  . Anxiety   . COPD (chronic obstructive pulmonary disease) (HCC)   . Essential hypertension   . History of echocardiogram    a. 08/2013 Echo: EF 55-60%, impaired LV relaxation, mild LVH, nl RV fxn, nl RVSP, mild TR; b. 05/2017 Echo: EF 65-70%, no rwma, nl RV fxn.  . Morbid obesity (HCC)   . Tobacco abuse     Medications Prior to Admission  Medication Sig Dispense Refill  . albuterol (PROVENTIL HFA;VENTOLIN HFA) 108 (90 Base) MCG/ACT inhaler Inhale 2 puffs into the lungs every 6 (six) hours as needed for wheezing or shortness of breath. 1 Inhaler 2  . budesonide (PULMICORT) 0.5 MG/2ML nebulizer solution Take 2 mLs (0.5 mg total) by nebulization 2 (two) times daily. 2 mL 12  . clonazePAM (KLONOPIN) 0.5 MG tablet Take 0.5 mg by mouth 3 (three) times daily as needed for anxiety.     . cyclobenzaprine (FLEXERIL) 10 MG tablet Take 10 mg by mouth 3 (three) times daily as needed for muscle spasms.     . fluticasone (FLOVENT HFA) 44 MCG/ACT inhaler Inhale 2 puffs into the lungs daily.    . furosemide (LASIX) 20 MG tablet Take 1 tablet (20 mg total) by mouth daily. 30 tablet 0  . ipratropium-albuterol (DUONEB) 0.5-2.5 (3) MG/3ML SOLN Take 3 mLs by nebulization every 6 (six) hours. (Patient taking differently: Take 3 mLs by nebulization 2 (two) times daily. ) 360 mL 0  . omeprazole (PRILOSEC) 20 MG capsule Take 20 mg by mouth daily.    Marland Kitchen warfarin (COUMADIN) 3 MG tablet Take 3 mg by mouth daily.    . protein supplement shake (PREMIER PROTEIN) LIQD Take 325 mLs (11 oz total) by mouth 2  (two) times daily between meals. (Patient not taking: Reported on 09/17/2017) 30 Can 0  . senna-docusate (SENOKOT-S) 8.6-50 MG tablet Take 1 tablet by mouth at bedtime as needed for mild constipation. (Patient not taking: Reported on 09/17/2017)    . sertraline (ZOLOFT) 50 MG tablet Take 1 tablet (50 mg total) by mouth at bedtime. (Patient not taking: Reported on 09/17/2017) 30 tablet 0     . butamben-tetracaine-benzocaine      . fentaNYL      . fentaNYL      . lidocaine      . midazolam      . midazolam      . sodium chloride flush      . aspirin  81 mg Oral Daily  . clonazePAM  0.5 mg Oral Daily  . enoxaparin (LOVENOX) injection  1 mg/kg Subcutaneous Q12H  . fluticasone furoate-vilanterol  1 puff Inhalation Daily  . furosemide  20 mg Oral Daily  . mouth rinse  15 mL Mouth Rinse BID  . pantoprazole  40 mg Oral Daily  . potassium chloride  40 mEq Oral BID  . warfarin  4 mg Oral Daily  . Warfarin - Physician Dosing Inpatient   Does not apply q1800    Infusions: . magnesium sulfate 1 - 4 g bolus IVPB Stopped (  09/20/17 1102)    No Known Allergies  Social History   Socioeconomic History  . Marital status: Single    Spouse name: Not on file  . Number of children: Not on file  . Years of education: Not on file  . Highest education level: Not on file  Occupational History  . Not on file  Social Needs  . Financial resource strain: Not on file  . Food insecurity:    Worry: Not on file    Inability: Not on file  . Transportation needs:    Medical: Not on file    Non-medical: Not on file  Tobacco Use  . Smoking status: Current Every Day Smoker    Packs/day: 0.75    Years: 40.00    Pack years: 30.00    Types: Cigarettes  . Smokeless tobacco: Never Used  Substance and Sexual Activity  . Alcohol use: No  . Drug use: No  . Sexual activity: Not on file  Lifestyle  . Physical activity:    Days per week: Not on file    Minutes per session: Not on file  . Stress: Not on file   Relationships  . Social connections:    Talks on phone: Not on file    Gets together: Not on file    Attends religious service: Not on file    Active member of club or organization: Not on file    Attends meetings of clubs or organizations: Not on file    Relationship status: Not on file  . Intimate partner violence:    Fear of current or ex partner: Not on file    Emotionally abused: Not on file    Physically abused: Not on file    Forced sexual activity: Not on file  Other Topics Concern  . Not on file  Social History Narrative   Lives in [place] with her mother, who has dementia.  She takes care of her mother and otw does not work.  She does not routinely exercise.    Family History  Problem Relation Age of Onset  . Dementia Mother   . Heart attack Father     PHYSICAL EXAM: Vitals:   09/20/17 0329 09/20/17 0835  BP: 122/77 (!) 113/58  Pulse: 100 90  Resp: 16   Temp: 98.4 F (36.9 C) 98.5 F (36.9 C)  SpO2: 98% 100%    No intake or output data in the 24 hours ending 09/20/17 1108  General:  Well appearing. No respiratory difficulty HEENT: normal Neck: supple. no JVD. Carotids 2+ bilat; no bruits. No lymphadenopathy or thryomegaly appreciated. Cor: PMI nondisplaced. Regular rate & rhythm. No rubs, gallops or murmurs. Lungs: clear Abdomen: soft, nontender, nondistended. No hepatosplenomegaly. No bruits or masses. Good bowel sounds. Extremities: no cyanosis, clubbing, rash, edema Neuro: alert & oriented x 3, cranial nerves grossly intact. moves all 4 extremities w/o difficulty. Affect pleasant.  ECG: MSR LAE, nonspecific st and t changes  Results for orders placed or performed during the hospital encounter of 09/19/17 (from the past 24 hour(s))  Basic metabolic panel     Status: Abnormal   Collection Time: 09/19/17 11:24 PM  Result Value Ref Range   Sodium 138 135 - 145 mmol/L   Potassium 3.2 (L) 3.5 - 5.1 mmol/L   Chloride 95 (L) 101 - 111 mmol/L   CO2 32  22 - 32 mmol/L   Glucose, Bld 95 65 - 99 mg/dL   BUN <5 (L) 6 - 20 mg/dL  Creatinine, Ser 0.62 0.44 - 1.00 mg/dL   Calcium 8.0 (L) 8.9 - 10.3 mg/dL   GFR calc non Af Amer >60 >60 mL/min   GFR calc Af Amer >60 >60 mL/min   Anion gap 11 5 - 15  CBC     Status: Abnormal   Collection Time: 09/19/17 11:24 PM  Result Value Ref Range   WBC 11.9 (H) 3.6 - 11.0 K/uL   RBC 4.89 3.80 - 5.20 MIL/uL   Hemoglobin 12.3 12.0 - 16.0 g/dL   HCT 16.1 09.6 - 04.5 %   MCV 79.8 (L) 80.0 - 100.0 fL   MCH 25.2 (L) 26.0 - 34.0 pg   MCHC 31.6 (L) 32.0 - 36.0 g/dL   RDW 40.9 (H) 81.1 - 91.4 %   Platelets 310 150 - 440 K/uL  Troponin I     Status: None   Collection Time: 09/19/17 11:24 PM  Result Value Ref Range   Troponin I <0.03 <0.03 ng/mL  Protime-INR     Status: Abnormal   Collection Time: 09/20/17 12:09 AM  Result Value Ref Range   Prothrombin Time 15.5 (H) 11.4 - 15.2 seconds   INR 1.24   Brain natriuretic peptide     Status: None   Collection Time: 09/20/17 12:09 AM  Result Value Ref Range   B Natriuretic Peptide 36.0 0.0 - 100.0 pg/mL  Troponin I     Status: None   Collection Time: 09/20/17  2:42 AM  Result Value Ref Range   Troponin I <0.03 <0.03 ng/mL  Troponin I     Status: None   Collection Time: 09/20/17  7:57 AM  Result Value Ref Range   Troponin I <0.03 <0.03 ng/mL  Lipid panel     Status: Abnormal   Collection Time: 09/20/17  7:57 AM  Result Value Ref Range   Cholesterol 174 0 - 200 mg/dL   Triglycerides 782 <956 mg/dL   HDL 37 (L) >21 mg/dL   Total CHOL/HDL Ratio 4.7 RATIO   VLDL 25 0 - 40 mg/dL   LDL Cholesterol 308 (H) 0 - 99 mg/dL  Magnesium     Status: Abnormal   Collection Time: 09/20/17  7:57 AM  Result Value Ref Range   Magnesium 1.4 (L) 1.7 - 2.4 mg/dL  APTT     Status: Abnormal   Collection Time: 09/20/17  7:57 AM  Result Value Ref Range   aPTT 39 (H) 24 - 36 seconds   Dg Chest 2 View  Result Date: 09/19/2017 CLINICAL DATA:  Chest pain. EXAM: CHEST - 2 VIEW  COMPARISON:  08/16/2017. FINDINGS: Port-A-Cath has been removed. The heart remains enlarged. There is mild vascular congestion, without consolidation or edema. No effusion or pneumothorax. No osseous findings. Increased body habitus. Slight improvement aeration compared with priors. IMPRESSION: Cardiomegaly with mild vascular congestion. No consolidation or edema. Electronically Signed   By: Elsie Stain M.D.   On: 09/19/2017 21:58     ASSESSMENT AND PLAN: Atypical chest pain with questionable opacity near left atrial appendage on 09/12/17 echo in office, advise TEE while in hospital. [facility]

## 2017-09-20 NOTE — Progress Notes (Signed)
[name] to be D/C'd Home per MD order. Patient given discharge teaching and paperwork regarding medications, diet, follow-up appointments and activity. Patient understanding verbalized. No questions or complaints at this time. Skin condition as charted. IV and telemetry removed prior to leaving.  An After Visit Summary was printed and given to the patient. Significant other at bedside for education.  Patient with follow-up appointment with Dr. Welton Flakes on Monday.   [name]

## 2017-09-20 NOTE — ED Provider Notes (Signed)
-----------------------------------------   2:28 AM on 09/20/2017 -----------------------------------------  Patient care assumed from Dr. Roxan Hockey.  Patient's labs have finally resulted, troponin is negative.  Patient continues with very mild chest pressure.  Patient is currently on Coumadin being transitioned from Xarelto for a mural thrombus per patient.  Scheduled to have a TEE performed today by Dr. Welton Flakes.  Patient states she never gets chest pain.  When it occurred it was moderate central chest pain/pressure.  Patient appears to be higher risk.  Given the patient's continued mild chest discomfort with recent mural thrombus we will admit to the hospitalist service for continued monitoring overnight, and further work-up.  Patient agreeable to this plan of care.   Minna Antis, MD 09/20/17 0230

## 2017-09-20 NOTE — Progress Notes (Signed)
Patient back from TEE. A&O, no distress. Discontinue lovenox and warfarin per Dr. Welton Flakes.

## 2017-09-20 NOTE — Progress Notes (Signed)
*  PRELIMINARY RESULTS* Echocardiogram Echocardiogram Transesophageal has been performed.  Cristela Blue 09/20/2017, 12:11 PM

## 2017-09-21 ENCOUNTER — Encounter: Payer: Self-pay | Admitting: Cardiovascular Disease

## 2017-09-21 NOTE — Discharge Summary (Signed)
[facility] [place] [facility]   PATIENT NAME: [name]    MR#:  [id number]  DATE OF BIRTH:  04/24/1966  DATE OF ADMISSION:  09/19/2017 ADMITTING PHYSICIAN: Barbaraann Rondo, MD  DATE OF DISCHARGE: 09/20/2017  5:26 PM  PRIMARY CARE PHYSICIAN: [name], Nadyne Coombes, MD   ADMISSION DIAGNOSIS:  Chest pain, unspecified type [R07.9]  DISCHARGE DIAGNOSIS:  Active Problems:   Chest pain   SECONDARY DIAGNOSIS:   Past Medical History:  Diagnosis Date  . Anxiety   . COPD (chronic obstructive pulmonary disease) (HCC)   . Essential hypertension   . History of echocardiogram    a. 08/2013 Echo: EF 55-60%, impaired LV relaxation, mild LVH, nl RV fxn, nl RVSP, mild TR; b. 05/2017 Echo: EF 65-70%, no rwma, nl RV fxn.  . Morbid obesity (HCC)   . Tobacco abuse      ADMITTING HISTORY  HISTORY OF PRESENT ILLNESS:  [name]  is a 52 y.o. female with a known history of morbid obesity, HTN, COPD (2L [place] home O2), chronic diastolic CHF (mild diastolic dysfxn per 07/30/2017 Echo), mural thrombus (Coumadin), anxiety, Hx tobacco use D/O p/w 1d Hx CP. Pt has a complicated medical history, w/ recent prolonged hospitalization (from 07/29/2017 to 08/17/2017, 2/2 COPD/hypoxemic respiratory failure and pneumonia necessitating intubation). 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 She tells me she was on Xarelto up until approximately last Wednesday (09/12/2017), at which time she states she was told to stop taking Xarelto and start taking Coumadin. Based on her description, it would appear she is not being bridged. She states she has been on Coumadin for the past 8d, and has been taking it consistently (daily). INR is 1.24 on present admission. Pt states that she sees Cardiologist Dr. Welton Flakes (and her NP, Tomasa Rand). She states she is scheduled to have a TEE on Thursday 09/20/2017, but is to be observed in the hospital for chest pain.  Pt states that  she was, "up and out" today, and went with her boyfriend for his preadmission testing prior to a procedure. She states they went home afterwards, and she was feeling well throughout most of the day. She states that @~1630-1700PM, she and her boyfriend were sitting outside in the back yard, "watching the grandbabies play," when she states she took a deep inspiration and experienced chest pain. She describes the chest pain as a deep midchest discomfort, which she characterizes more as a soreness than as a pressure. It was reported to me that pt had previously described the sensation as that of "something sitting on her chest", but when I ask her this question directly, she denies it. She states she was not experiencing any discomfort when breathing normally, and only when she was taking a deep inspiration). She states the pain was non-radiating, non-positional, non-pleuritic, non-exertional and non-reproducible w/ chest wall palpation (though, again, the pain would occur consistently on deep breath). She denies SOB (worsening from baseline), cough, hemoptysis or wheezing. She denies associated palpitations, diaphoresis, N/V, LH/LOC. She states she sat and ignored the pain for 1.5-2hrs, hoping it would improve spontaneously. She states it did not. She normally wears oxygen (2L [place]), but states she was not wearing it outside. She thought that may have been the cause of her symptoms, so she went back inside and put her oxygen back on. Her symptoms did not improve. She discussed the situation with her boyfriend, and then asked him to drive her to the [facility]  ED for evaluation. The pt states that she has been very anxious about her TEE (scheduled for Thursday 09/20/2017). She states she was given anxiety medication in the ED, which improved her chest pain. She states she is not experiencing any pain on deep inspiration at the time of my Hx/examination, and her symptoms have resolved. She is w/o complaint, and states she feels  "great" (in her words).  Pt denies F/C/N/V/D/AP, palpitations, diaphoresis, night sweats, rigors, weight loss, HA, vertigo, blurred vision, urinary symptoms. She is well-appearing and in no acute distress. The pt states she has not had a stress test in the past. She is uninsured. She states a stress test was scheduled, but cancelled due to her not being insured. She is presently applying for emergency Medicaid. She does not have a Pulmonologist (for the same reason). She has multiple cardiac risk factors, endorses a family history of cardiac disease (in her mother and father), and is a former smoker (up until 07/2017).     HOSPITAL COURSE:   *Atypical chest pain.  Patient was admitted to the hospital.  Troponins remain negative on 3 sets.  EKG showed no acute changes.  No arrhythmias on telemetry.  Patient did not have recurrence of chest pain.  She had her TEE while in the hospital which was scheduled as outpatient previously.  Showed no thrombus.  Cardiology Dr. Lennette Bihari recommended stopping warfarin.  Patient is being discharged home for atypical chest pain.  She will follow-up with Dr. Lennette Bihari tomorrow for CTA coronaries in his office.  Case was discussed with cardiology prior to discharge.  Stable for discharge home.  CONSULTS OBTAINED:  Treatment Team:  Barbaraann Rondo, MD Lamar Blinks, MD Laurier Nancy, MD  DRUG ALLERGIES:  No Known Allergies  DISCHARGE MEDICATIONS:   Allergies as of 09/20/2017   No Known Allergies     Medication List    STOP taking these medications   warfarin 3 MG tablet Commonly known as:  COUMADIN     TAKE these medications   albuterol 108 (90 Base) MCG/ACT inhaler Commonly known as:  PROVENTIL HFA;VENTOLIN HFA Inhale 2 puffs into the lungs every 6 (six) hours as needed for wheezing or shortness of breath.   budesonide 0.5 MG/2ML nebulizer solution Commonly known as:  PULMICORT Take 2 mLs (0.5 mg total) by nebulization 2 (two) times daily.    clonazePAM 0.5 MG tablet Commonly known as:  KLONOPIN Take 0.5 mg by mouth 3 (three) times daily as needed for anxiety.   cyclobenzaprine 10 MG tablet Commonly known as:  FLEXERIL Take 10 mg by mouth 3 (three) times daily as needed for muscle spasms.   fluticasone 44 MCG/ACT inhaler Commonly known as:  FLOVENT HFA Inhale 2 puffs into the lungs daily.   furosemide 20 MG tablet Commonly known as:  LASIX Take 1 tablet (20 mg total) by mouth daily.   ipratropium-albuterol 0.5-2.5 (3) MG/3ML Soln Commonly known as:  DUONEB Take 3 mLs by nebulization every 6 (six) hours. What changed:  when to take this   omeprazole 20 MG capsule Commonly known as:  PRILOSEC Take 20 mg by mouth daily.   protein supplement shake Liqd Commonly known as:  PREMIER PROTEIN Take 325 mLs (11 oz total) by mouth 2 (two) times daily between meals.   senna-docusate 8.6-50 MG tablet Commonly known as:  Senokot-S Take 1 tablet by mouth at bedtime as needed for mild constipation.   sertraline 50 MG tablet Commonly known as:  ZOLOFT Take 1  tablet (50 mg total) by mouth at bedtime.       Today   VITAL SIGNS:  Blood pressure 124/68, pulse 82, temperature 98.5 F (36.9 C), temperature source Oral, resp. rate (!) 21, height 5' (1.524 m), weight 110.1 kg (242 lb 11.2 oz), SpO2 94 %.  I/O:  No intake or output data in the 24 hours ending 09/21/17 1545  PHYSICAL EXAMINATION:  Physical Exam  GENERAL:  52 y.o.-year-old patient lying in the bed with no acute distress.  LUNGS: Normal breath sounds bilaterally, no wheezing, rales,rhonchi or crepitation. No use of accessory muscles of respiration.  CARDIOVASCULAR: S1, S2 normal. No murmurs, rubs, or gallops.  ABDOMEN: Soft, non-tender, non-distended. Bowel sounds present. No organomegaly or mass.  NEUROLOGIC: Moves all 4 extremities. PSYCHIATRIC: The patient is alert and oriented x 3.  SKIN: No obvious rash, lesion, or ulcer.   DATA REVIEW:    CBC Recent Labs  Lab 09/19/17 2324  WBC 11.9*  HGB 12.3  HCT 39.0  PLT 310    Chemistries  Recent Labs  Lab 09/19/17 2324 09/20/17 0757  NA 138  --   K 3.2*  --   CL 95*  --   CO2 32  --   GLUCOSE 95  --   BUN <5*  --   CREATININE 0.62  --   CALCIUM 8.0*  --   MG  --  1.4*    Cardiac Enzymes Recent Labs  Lab 09/20/17 0757  TROPONINI <0.03    Microbiology Results  Results for orders placed or performed during the hospital encounter of 07/29/17  Urine culture     Status: Abnormal   Collection Time: 07/29/17 11:30 PM  Result Value Ref Range Status   Specimen Description   Final    URINE, RANDOM Performed at [facility], [address], [place], [place] [address]    Special Requests   Final    NONE Performed at [facility], [address], [place], [place] [address]    Culture MULTIPLE SPECIES PRESENT, SUGGEST RECOLLECTION (A)  Final   Report Status 07/31/2017 FINAL  Final  MRSA PCR Screening     Status: None   Collection Time: 07/31/17  1:29 AM  Result Value Ref Range Status   MRSA by PCR NEGATIVE NEGATIVE Final    Comment:        The GeneXpert MRSA Assay (FDA approved for NASAL specimens only), is one component of a comprehensive MRSA colonization surveillance program. It is not intended to diagnose MRSA infection nor to guide or monitor treatment for MRSA infections. Performed at [facility], [address] [place], [place] [address]   Culture, respiratory (NON-Expectorated)     Status: None   Collection Time: 07/31/17  2:50 PM  Result Value Ref Range Status   Specimen Description   Final    TRACHEAL ASPIRATE Performed at [facility], [address] [place], [place] [address]    Special Requests   Final    NONE Performed at [facility], [address] [place], [place] [address]    Gram Stain   Final    RARE WBC PRESENT, PREDOMINANTLY PMN RARE GRAM POSITIVE COCCI RARE GRAM  POSITIVE RODS    Culture   Final    Consistent with normal respiratory flora. Performed at [facility], [address], [place], [place] [address]    Report Status 08/03/2017 FINAL  Final  Culture, respiratory (NON-Expectorated)     Status:  None   Collection Time: 08/04/17 11:53 AM  Result Value Ref Range Status   Specimen Description   Final    TRACHEAL ASPIRATE Performed at [facility], [address] [place], [place] [address]    Special Requests   Final    NONE Performed at [facility], [address] [place], [place] [address]    Gram Stain   Final    RARE WBC PRESENT,BOTH PMN AND MONONUCLEAR RARE SQUAMOUS EPITHELIAL CELLS PRESENT RARE GRAM POSITIVE COCCI    Culture   Final    Consistent with normal respiratory flora. Performed at [facility] [place] [facility], [address] [place], [place] [address]    Report Status 08/06/2017 FINAL  Final  Culture, respiratory (NON-Expectorated)     Status: None   Collection Time: 08/10/17  2:32 PM  Result Value Ref Range Status   Specimen Description TRACHEAL ASPIRATE  Final   Special Requests   Final    NONE Performed at [facility], [address] [place], [place] [address]    Gram Stain   Final    ABUNDANT WBC PRESENT, PREDOMINANTLY PMN RARE SQUAMOUS EPITHELIAL CELLS PRESENT MODERATE GRAM POSITIVE COCCI IN CHAINS    Culture   Final    FEW Consistent with normal respiratory flora. Performed at [facility], [address] [place], [place] [address]    Report Status 08/12/2017 FINAL  Final  Culture, respiratory (NON-Expectorated)     Status: None   Collection Time: 08/13/17  6:30 PM  Result Value Ref Range Status   Specimen Description   Final    TRACHEAL ASPIRATE Performed at [facility], [address], [place], [place] [address]    Special Requests   Final    NONE Performed at [facility], [address] [place], [place] [address]    Gram Stain    Final    RARE WBC PRESENT, PREDOMINANTLY PMN NO ORGANISMS SEEN    Culture   Final    Consistent with normal respiratory flora. Performed at [facility], [address] [place], [place] [address]    Report Status 08/16/2017 FINAL  Final  CULTURE, BLOOD (ROUTINE X 2) w Reflex to ID Panel     Status: None   Collection Time: 08/13/17  7:06 PM  Result Value Ref Range Status   Specimen Description BLOOD BLOOD RIGHT HAND  Final   Special Requests   Final    BOTTLES DRAWN AEROBIC AND ANAEROBIC Blood Culture results may not be optimal due to an inadequate volume of blood received in culture bottles   Culture   Final    NO GROWTH 5 DAYS Performed at [facility], [address] [place], [place] [address]    Report Status 08/18/2017 FINAL  Final  CULTURE, BLOOD (ROUTINE X 2) w Reflex to ID Panel     Status: None   Collection Time: 08/13/17  7:13 PM  Result Value Ref Range Status   Specimen Description BLOOD BLOOD LEFT HAND  Final   Special Requests   Final    BOTTLES DRAWN AEROBIC AND ANAEROBIC Blood Culture adequate volume   Culture   Final    NO GROWTH 5 DAYS Performed at [facility], [address] [place], [place] [address]    Report Status 08/18/2017 FINAL  Final    RADIOLOGY:  Dg Chest 2 View  Result Date: 09/19/2017 CLINICAL DATA:  Chest pain. EXAM: CHEST - 2 VIEW COMPARISON:  08/16/2017. FINDINGS: Port-A-Cath has been removed. The heart remains enlarged. There is mild vascular congestion, without consolidation or edema. No effusion or pneumothorax. No osseous findings. Increased body habitus. Slight improvement aeration compared with priors. IMPRESSION: Cardiomegaly with mild vascular congestion. No consolidation or edema. Electronically Signed   By: Elsie Stain M.D.   On: 09/19/2017 21:58    Follow up with PCP in 1 week.  Management plans discussed with the patient, family and they are in agreement.  CODE STATUS:  Code Status History    Date  Active Date Inactive Code Status Order ID Comments User Context   09/20/2017 0326 09/20/2017 2031 Full Code [id number]  Barbaraann Rondo, MD ED   07/29/2017 2223 08/17/2017 1509 Full Code [id number]  Altamese Dilling, MD Inpatient   06/07/2017 1347 06/12/2017 2013 Full Code [id number]  Adrian Saran, MD Inpatient      TOTAL TIME TAKING CARE OF THIS PATIENT ON DAY OF DISCHARGE: more than 30 minutes.   Molinda Bailiff Shanyah Gattuso M.D on 09/21/2017 at 3:45 PM  Between 7am to 6pm - Pager - [phone]  After 6pm go to [email or web address] - password [email or web address] [facility]  [facility]  Office  [phone]  CC: Primary care physician; Cathlean Marseilles, MD  Note: This dictation was prepared with Dragon dictation along with smaller phrase technology. Any transcriptional errors that result from this process are unintentional.

## 2017-11-12 ENCOUNTER — Other Ambulatory Visit: Payer: Self-pay

## 2017-11-12 ENCOUNTER — Encounter: Payer: Self-pay | Admitting: Physician Assistant

## 2017-11-12 ENCOUNTER — Emergency Department
Admission: EM | Admit: 2017-11-12 | Discharge: 2017-11-12 | Disposition: A | Discharge status: Home / Self Care | Payer: Medicaid Other | Attending: Emergency Medicine | Admitting: Emergency Medicine

## 2017-11-12 DIAGNOSIS — I10 Essential (primary) hypertension: Secondary | ICD-10-CM | POA: Diagnosis not present

## 2017-11-12 DIAGNOSIS — J449 Chronic obstructive pulmonary disease, unspecified: Secondary | ICD-10-CM | POA: Insufficient documentation

## 2017-11-12 DIAGNOSIS — F419 Anxiety disorder, unspecified: Secondary | ICD-10-CM | POA: Diagnosis not present

## 2017-11-12 DIAGNOSIS — J029 Acute pharyngitis, unspecified: Secondary | ICD-10-CM

## 2017-11-12 DIAGNOSIS — Z9049 Acquired absence of other specified parts of digestive tract: Secondary | ICD-10-CM | POA: Insufficient documentation

## 2017-11-12 DIAGNOSIS — Z79899 Other long term (current) drug therapy: Secondary | ICD-10-CM | POA: Insufficient documentation

## 2017-11-12 DIAGNOSIS — F17210 Nicotine dependence, cigarettes, uncomplicated: Secondary | ICD-10-CM | POA: Insufficient documentation

## 2017-11-12 LAB — GROUP A STREP BY PCR: Group A Strep by PCR: NOT DETECTED

## 2017-11-12 MED ORDER — MAGIC MOUTHWASH W/LIDOCAINE: 5.0000 mL | Freq: Four times a day (QID) | ORAL | 0 refills | Status: DC

## 2017-11-12 NOTE — ED Provider Notes (Signed)
[facility] Emergency Department Provider Note  ____________________________________________   First MD Initiated Contact with Patient 11/12/17 1640     (approximate)  I have reviewed the triage vital signs and the nursing notes.   HISTORY  Chief Complaint Sore Throat    HPI [name] is a 52 y.o. female presents emergency department complaining of a sore throat.  She states she has had increased pain over the last 3 days.  She states that she thinks she has strep throat.  She had some throat discomfort after being intubated back in March and she also had yeast at the time.  Denies any fever or chills.  Denies chest pain or shortness of breath.  Past Medical History:  Diagnosis Date  . Anxiety   . COPD (chronic obstructive pulmonary disease) (HCC)   . Essential hypertension   . History of echocardiogram    a. 08/2013 Echo: EF 55-60%, impaired LV relaxation, mild LVH, nl RV fxn, nl RVSP, mild TR; b. 05/2017 Echo: EF 65-70%, no rwma, nl RV fxn.  . Morbid obesity (HCC)   . Tobacco abuse     Patient Active Problem List   Diagnosis Date Noted  . Chest pain 09/20/2017  . Pericardial effusion   . Acute on chronic respiratory failure with hypoxia (HCC) 07/29/2017  . Acute respiratory failure (HCC) 07/29/2017  . PNA (pneumonia) 06/07/2017    Past Surgical History:  Procedure Laterality Date  . ABDOMINAL HYSTERECTOMY    . CHOLECYSTECTOMY    . EXTUBATION (ENDOTRACHEAL) IN OR N/A 08/14/2017   Procedure: EXTUBATION (ENDOTRACHEAL) IN OR;  Surgeon: Geanie Logan, MD;  Location: [facility] ORS;  Service: ENT;  Laterality: N/A;  . HERNIA REPAIR    . TEE WITHOUT CARDIOVERSION N/A 09/20/2017   Procedure: TRANSESOPHAGEAL ECHOCARDIOGRAM (TEE);  Surgeon: Laurier Nancy, MD;  Location: [facility] ORS;  Service: Cardiovascular;  Laterality: N/A;    Prior to Admission medications   Medication Sig Start Date End Date Taking? Authorizing Provider  albuterol (PROVENTIL  HFA;VENTOLIN HFA) 108 (90 Base) MCG/ACT inhaler Inhale 2 puffs into the lungs every 6 (six) hours as needed for wheezing or shortness of breath. 06/12/17   Gouru, Aruna, MD  budesonide (PULMICORT) 0.5 MG/2ML nebulizer solution Take 2 mLs (0.5 mg total) by nebulization 2 (two) times daily. 08/17/17   Katha Hamming, MD  clonazePAM (KLONOPIN) 0.5 MG tablet Take 0.5 mg by mouth 3 (three) times daily as needed for anxiety.     [provider]  cyclobenzaprine (FLEXERIL) 10 MG tablet Take 10 mg by mouth 3 (three) times daily as needed for muscle spasms.  08/16/16   [provider]  fluticasone (FLOVENT HFA) 44 MCG/ACT inhaler Inhale 2 puffs into the lungs daily.    [provider]  furosemide (LASIX) 20 MG tablet Take 1 tablet (20 mg total) by mouth daily. 08/17/17 08/17/18  Katha Hamming, MD  ipratropium-albuterol (DUONEB) 0.5-2.5 (3) MG/3ML SOLN Take 3 mLs by nebulization every 6 (six) hours. Patient taking differently: Take 3 mLs by nebulization 2 (two) times daily.  08/17/17   Katha Hamming, MD  magic mouthwash w/lidocaine SOLN Take 5 mLs by mouth 4 (four) times daily. 11/12/17   Kerrington Greenhalgh, Roselyn Bering, PA-C  omeprazole (PRILOSEC) 20 MG capsule Take 20 mg by mouth daily.    [provider]    Allergies Patient has no known allergies.  Family History  Problem Relation Age of Onset  . Dementia Mother   . Heart attack Father  Social History Social History   Tobacco Use  . Smoking status: Current Every Day Smoker    Packs/day: 0.75    Years: 40.00    Pack years: 30.00    Types: Cigarettes  . Smokeless tobacco: Never Used  Substance Use Topics  . Alcohol use: No  . Drug use: No    Review of Systems  Constitutional: No fever/chills Eyes: No visual changes. ENT: Positive sore throat. Respiratory: Denies cough Genitourinary: Negative for dysuria. Musculoskeletal: Negative for back pain. Skin: Negative for  rash.    ____________________________________________   PHYSICAL EXAM:  VITAL SIGNS: ED Triage Vitals  Enc Vitals Group     BP 11/12/17 1651 (!) 141/71     Pulse Rate 11/12/17 1651 82     Resp 11/12/17 1651 20     Temp 11/12/17 1651 98.7 F (37.1 C)     Temp Source 11/12/17 1651 Oral     SpO2 11/12/17 1651 97 %     Weight 11/12/17 1643 247 lb (112 kg)     Height 11/12/17 1643 5' (1.524 m)     Head Circumference --      Peak Flow --      Pain Score 11/12/17 1643 7     Pain Loc --      Pain Edu? --      Excl. in GC? --     Constitutional: Alert and oriented. Well appearing and in no acute distress. Eyes: Conjunctivae are normal.  Head: Atraumatic. Nose: No congestion/rhinnorhea. Mouth/Throat: Mucous membranes are moist.  Throat is swollen with a narrow airway, there is redness posteriorly, no exudate or thrush is noted Neck: Is supple, no lymphadenopathy is noted Cardiovascular: Normal rate, regular rhythm.  Heart sounds are normal Respiratory: Normal respiratory effort.  No retractions, lungs clear to auscultation GU: deferred Musculoskeletal: FROM all extremities, warm and well perfused Neurologic:  Normal speech and language.  Skin:  Skin is warm, dry and intact. No rash noted. Psychiatric: Mood and affect are normal. Speech and behavior are normal.  ____________________________________________   LABS (all labs ordered are listed, but only abnormal results are displayed)  Labs Reviewed  GROUP A STREP BY PCR   ____________________________________________   ____________________________________________  RADIOLOGY    ____________________________________________   PROCEDURES  Procedure(s) performed: No  Procedures    ____________________________________________   INITIAL IMPRESSION / ASSESSMENT AND PLAN / ED COURSE  Pertinent labs & imaging results that were available during my care of the patient were reviewed by me and considered in my medical  decision making (see chart for details).  Patient is 52 year old female presents emergency department complaining of sore throat.  States feels like she has strep throat.  She denies fever chills.    On physical exam the patient does have a narrow airway with a reddened throat.  Remainder the exam is unremarkable  Strep test ordered   Strep test is negative  Discussed the test results with the patient.  She was given a prescription for Magic mouthwash.  She is to gargle swish and spit 3 times daily.  She is to return to the emergency department if worsening.  She states she understands will comply with our instructions.  She was discharged in stable condition  As part of my medical decision making, I reviewed the following data within the electronic MEDICAL RECORD NUMBER Nursing notes reviewed and incorporated, Labs reviewed strep is negative, Old chart reviewed, Notes from prior ED visits and [place] Controlled Substance Database  ____________________________________________  FINAL CLINICAL IMPRESSION(S) / ED DIAGNOSES  Final diagnoses:  Acute pharyngitis, unspecified etiology      NEW MEDICATIONS STARTED DURING THIS VISIT:  Discharge Medication List as of 11/12/2017  6:03 PM    START taking these medications   Details  magic mouthwash w/lidocaine SOLN Take 5 mLs by mouth 4 (four) times daily., Starting Mon 11/12/2017, Print         Note:  This document was prepared using Dragon voice recognition software and may include unintentional dictation errors.    Faythe GheeFisher, Abdirahim Flavell W, PA-C 11/12/17 Almon Register1949    Quale, Mark, MD 11/13/17 [phone]

## 2017-11-12 NOTE — Discharge Instructions (Addendum)
Gargle with warm salt water, use the Magic mouthwash to see use of the throat.  Return to emergency department worsening.  Take Tylenol or ibuprofen as needed

## 2017-11-12 NOTE — ED Notes (Signed)
Pt alert and oriented X4, active, cooperative, pt in NAD. RR even and unlabored, color WNL.  Pt informed to return if any life threatening symptoms occur.  Discharge and followup instructions reviewed.  

## 2017-11-12 NOTE — ED Triage Notes (Signed)
Presents with sore throat for the past 3 days   Having increased pain with swallowing   No fever  States she ha shad some throat discomfort since being intubated but this is different

## 2017-11-16 ENCOUNTER — Encounter: Payer: Self-pay | Admitting: Emergency Medicine

## 2017-11-16 ENCOUNTER — Other Ambulatory Visit: Payer: Self-pay

## 2017-11-16 ENCOUNTER — Emergency Department
Admission: EM | Admit: 2017-11-16 | Discharge: 2017-11-16 | Disposition: A | Discharge status: Home / Self Care | Payer: Medicaid Other | Attending: Emergency Medicine | Admitting: Emergency Medicine

## 2017-11-16 DIAGNOSIS — I10 Essential (primary) hypertension: Secondary | ICD-10-CM | POA: Insufficient documentation

## 2017-11-16 DIAGNOSIS — J029 Acute pharyngitis, unspecified: Secondary | ICD-10-CM | POA: Insufficient documentation

## 2017-11-16 DIAGNOSIS — F17210 Nicotine dependence, cigarettes, uncomplicated: Secondary | ICD-10-CM | POA: Diagnosis not present

## 2017-11-16 DIAGNOSIS — Z79899 Other long term (current) drug therapy: Secondary | ICD-10-CM | POA: Diagnosis not present

## 2017-11-16 DIAGNOSIS — J449 Chronic obstructive pulmonary disease, unspecified: Secondary | ICD-10-CM | POA: Diagnosis not present

## 2017-11-16 DIAGNOSIS — R070 Pain in throat: Secondary | ICD-10-CM | POA: Diagnosis present

## 2017-11-16 MED ORDER — PREDNISONE 50 MG PO TABS: 50.0000 mg | ORAL_TABLET | Freq: Every day | ORAL | 0 refills | Status: DC

## 2017-11-16 MED ORDER — MAGIC MOUTHWASH W/LIDOCAINE: 5.0000 mL | Freq: Four times a day (QID) | ORAL | 1 refills | Status: DC

## 2017-11-16 NOTE — ED Provider Notes (Signed)
[facility] Emergency Department Provider Note  ____________________________________________  Time seen: Approximately 5:54 PM  I have reviewed the triage vital signs and the nursing notes.   HISTORY  Chief Complaint Sore Throat    HPI [name] is a 52 y.o. female who presents the emergency department requesting a refill of the medication previously prescribed 4 days ago.  Patient was seen in this department for complaint of sore throat that have been ongoing after she was extubated.  Patient reports that she had been admitted to the hospital for respiratory failure and intubated.  Patient had indwelling ET tube for just longer than 2 weeks prior to extubation.  Patient was denying at the time and currently any shortness of breath, difficulty breathing or swallowing.  Patient had a negative strep test 4 days ago.  No change in symptoms today, no fevers or chills, no difficulty breathing or swallowing.  Patient reports that she had taken her prescription to be filled but they were unable to do so.  On review of the medication prescribed, nystatin was part of the mixture.  Given the national shortage, it was likely that pharmacy was attempting to fill medication without nystatin but pharmacy did not contact the original prescriber for medication change.  Patient is requesting a new prescription of the oral solution for symptom improvement.  Patient currently denies any fevers or chills, URI symptoms, headache, neck pain or stiffness, chest pain, shortness of breath, abdominal pain, nausea or vomiting.  Patient reports ongoing sore throat, raspy voice, taste changes since extubation.   Past Medical History:  Diagnosis Date  . Anxiety   . COPD (chronic obstructive pulmonary disease) (HCC)   . Essential hypertension   . History of echocardiogram    a. 08/2013 Echo: EF 55-60%, impaired LV relaxation, mild LVH, nl RV fxn, nl RVSP, mild TR; b. 05/2017 Echo: EF 65-70%, no  rwma, nl RV fxn.  . Morbid obesity (HCC)   . Tobacco abuse     Patient Active Problem List   Diagnosis Date Noted  . Chest pain 09/20/2017  . Pericardial effusion   . Acute on chronic respiratory failure with hypoxia (HCC) 07/29/2017  . Acute respiratory failure (HCC) 07/29/2017  . PNA (pneumonia) 06/07/2017    Past Surgical History:  Procedure Laterality Date  . ABDOMINAL HYSTERECTOMY    . CHOLECYSTECTOMY    . EXTUBATION (ENDOTRACHEAL) IN OR N/A 08/14/2017   Procedure: EXTUBATION (ENDOTRACHEAL) IN OR;  Surgeon: Geanie Logan, MD;  Location: [facility] ORS;  Service: ENT;  Laterality: N/A;  . HERNIA REPAIR    . TEE WITHOUT CARDIOVERSION N/A 09/20/2017   Procedure: TRANSESOPHAGEAL ECHOCARDIOGRAM (TEE);  Surgeon: Laurier Nancy, MD;  Location: [facility] ORS;  Service: Cardiovascular;  Laterality: N/A;    Prior to Admission medications   Medication Sig Start Date End Date Taking? Authorizing Provider  albuterol (PROVENTIL HFA;VENTOLIN HFA) 108 (90 Base) MCG/ACT inhaler Inhale 2 puffs into the lungs every 6 (six) hours as needed for wheezing or shortness of breath. 06/12/17   Gouru, Aruna, MD  budesonide (PULMICORT) 0.5 MG/2ML nebulizer solution Take 2 mLs (0.5 mg total) by nebulization 2 (two) times daily. 08/17/17   Katha Hamming, MD  clonazePAM (KLONOPIN) 0.5 MG tablet Take 0.5 mg by mouth 3 (three) times daily as needed for anxiety.     [provider]  cyclobenzaprine (FLEXERIL) 10 MG tablet Take 10 mg by mouth 3 (three) times daily as needed for muscle spasms.  08/16/16   [provider]  fluticasone (FLOVENT HFA) 44 MCG/ACT inhaler Inhale 2 puffs into the lungs daily.    [provider]  furosemide (LASIX) 20 MG tablet Take 1 tablet (20 mg total) by mouth daily. 08/17/17 08/17/18  Katha Hamming, MD  ipratropium-albuterol (DUONEB) 0.5-2.5 (3) MG/3ML SOLN Take 3 mLs by nebulization every 6 (six) hours. Patient taking differently: Take 3 mLs by nebulization 2 (two)  times daily.  08/17/17   Katha Hamming, MD  magic mouthwash w/lidocaine SOLN Take 5 mLs by mouth 4 (four) times daily. 11/16/17   Cuthriell, Delorise Royals, PA-C  omeprazole (PRILOSEC) 20 MG capsule Take 20 mg by mouth daily.    [provider]  predniSONE (DELTASONE) 50 MG tablet Take 1 tablet (50 mg total) by mouth daily with breakfast. 11/16/17   Cuthriell, Delorise Royals, PA-C    Allergies Patient has no known allergies.  Family History  Problem Relation Age of Onset  . Dementia Mother   . Heart attack Father     Social History Social History   Tobacco Use  . Smoking status: Current Every Day Smoker    Packs/day: 0.75    Years: 40.00    Pack years: 30.00    Types: Cigarettes  . Smokeless tobacco: Never Used  Substance Use Topics  . Alcohol use: No  . Drug use: No     Review of Systems  Constitutional: No fever/chills Eyes: No visual changes. No discharge ENT: Ongoing sore throat, raspy voice.  No difficulty breathing or swallowing. Cardiovascular: no chest pain. Respiratory: no cough. No SOB. Gastrointestinal: No abdominal pain.  No nausea, no vomiting.   Musculoskeletal: Negative for musculoskeletal pain. Skin: Negative for rash, abrasions, lacerations, ecchymosis. Neurological: Negative for headaches, focal weakness or numbness. 10-point ROS otherwise negative.  ____________________________________________   PHYSICAL EXAM:  VITAL SIGNS: ED Triage Vitals  Enc Vitals Group     BP 11/16/17 1620 (!) 122/55     Pulse Rate 11/16/17 1620 95     Resp 11/16/17 1620 (!) 22     Temp 11/16/17 1620 98.8 F (37.1 C)     Temp Source 11/16/17 1620 Oral     SpO2 11/16/17 1620 99 %     Weight 11/16/17 1621 247 lb (112 kg)     Height 11/16/17 1621 5' (1.524 m)     Head Circumference --      Peak Flow --      Pain Score 11/16/17 1621 8     Pain Loc --      Pain Edu? --      Excl. in GC? --      Constitutional: Alert and oriented. Well appearing and in no acute  distress. Eyes: Conjunctivae are normal. PERRL. EOMI. Head: Atraumatic. ENT:      Ears:       Nose: No congestion/rhinnorhea.      Mouth/Throat: Mucous membranes are moist.  Patient with a Mallampati score 4.  Uvula is midline.  Tonsils are neither erythematous nor edematous.  No exudates.  No uvular deviation.  No indication of dental infection/abscess.  No indication of deep space infection such as Ludwick's angina. Neck: No stridor.  Neck is supple full range of motion Hematological/Lymphatic/Immunilogical: No cervical lymphadenopathy. Cardiovascular: Normal rate, regular rhythm. Normal S1 and S2.  Good peripheral circulation. Respiratory: Normal respiratory effort without tachypnea or retractions. Lungs CTAB. Good air entry to the bases with no decreased or absent breath sounds. Musculoskeletal: Full range of motion to all extremities. No gross  deformities appreciated. Neurologic:  Normal speech and language. No gross focal neurologic deficits are appreciated.  Skin:  Skin is warm, dry and intact. No rash noted. Psychiatric: Mood and affect are normal. Speech and behavior are normal. Patient exhibits appropriate insight and judgement.   ____________________________________________   LABS (all labs ordered are listed, but only abnormal results are displayed)  Labs Reviewed - No data to display ____________________________________________  EKG   ____________________________________________  RADIOLOGY   No results found.  ____________________________________________    PROCEDURES  Procedure(s) performed:    Procedures    Medications - No data to display   ____________________________________________   INITIAL IMPRESSION / ASSESSMENT AND PLAN / ED COURSE  Pertinent labs & imaging results that were available during my care of the patient were reviewed by me and considered in my medical decision making (see chart for details).  Review of the [place] CSRS was  performed in accordance of the [facility] prior to dispensing any controlled drugs.      Patient's diagnosis is consistent with pharyngitis.  Patient presents with ongoing sore throat after extubation.  Patient had been intubated after respiratory failure for greater than 2 weeks prior to extubation.  Patient has had ongoing sore throat, hoarse voice, taste changes.  Differential includes strep, viral pharyngitis, peritonsillar abscess, retropharyngeal abscess, laryngitis, laryngeal nerve palsy.  Patient had a negative strep test 4 days ago.  No indication on exam of strep with patient being afebrile, no lymphadenopathy, no exudates.  No indication of tonsillar stone on exam.  No findings consistent for retropharyngeal or peritonsillar abscess.  Findings are inconsistent with viral pharyngitis or laryngitis given the length of duration.  At this time, no indication for labs or imaging.  Patient will be given short course of oral steroid, Magic mouthwash for symptom improvement.  Patient experiences no symptomatic improvement on these medications, she is to follow-up with ENT surgery for further evaluation of ongoing sore throat, voice changes, taste changes status post extubation..  Patient is given ED precautions to return to the ED for any worsening or new symptoms.     ____________________________________________  FINAL CLINICAL IMPRESSION(S) / ED DIAGNOSES  Final diagnoses:  Pharyngitis, unspecified etiology      NEW MEDICATIONS STARTED DURING THIS VISIT:  ED Discharge Orders        Ordered    magic mouthwash w/lidocaine SOLN  4 times daily    Note to Pharmacy:  Dispense in a 1/1/1 ratio. Use lidocaine, diphenhydramine, prednisolone   11/16/17 1803    predniSONE (DELTASONE) 50 MG tablet  Daily with breakfast     11/16/17 1803          This chart was dictated using voice recognition software/Dragon. Despite best efforts to proofread, errors can occur which can change the meaning. Any  change was purely unintentional.    Racheal PatchesCuthriell, Jonathan D, PA-C 11/16/17 1803    Emily FilbertWilliams, Jonathan E, MD 11/16/17 [phone]

## 2017-11-16 NOTE — ED Triage Notes (Signed)
Pt comes into the ED via POV c/o sore throat.  Patient was seen on Monday and sent home with a script for magic mouthwash.  Patient's pharmacy was unable to fill it and stated they needed to speak with the prescribing physician but were unable to contact them.  The patient states the pain has not gotten any better and so she is back to see if they can get a new script.

## 2018-03-16 ENCOUNTER — Encounter: Payer: Self-pay | Admitting: Emergency Medicine

## 2018-03-16 ENCOUNTER — Emergency Department
Admission: EM | Admit: 2018-03-16 | Discharge: 2018-03-16 | Disposition: A | Discharge status: Home / Self Care | Payer: Medicaid Other | Attending: Emergency Medicine | Admitting: Emergency Medicine

## 2018-03-16 ENCOUNTER — Other Ambulatory Visit: Payer: Self-pay

## 2018-03-16 ENCOUNTER — Emergency Department: Payer: Medicaid Other

## 2018-03-16 DIAGNOSIS — J441 Chronic obstructive pulmonary disease with (acute) exacerbation: Secondary | ICD-10-CM | POA: Diagnosis not present

## 2018-03-16 DIAGNOSIS — R079 Chest pain, unspecified: Secondary | ICD-10-CM | POA: Insufficient documentation

## 2018-03-16 DIAGNOSIS — Z79899 Other long term (current) drug therapy: Secondary | ICD-10-CM | POA: Diagnosis not present

## 2018-03-16 DIAGNOSIS — R05 Cough: Secondary | ICD-10-CM | POA: Diagnosis present

## 2018-03-16 DIAGNOSIS — R0602 Shortness of breath: Secondary | ICD-10-CM | POA: Diagnosis not present

## 2018-03-16 DIAGNOSIS — F17210 Nicotine dependence, cigarettes, uncomplicated: Secondary | ICD-10-CM | POA: Diagnosis not present

## 2018-03-16 MED ORDER — PREDNISONE 20 MG PO TABS
60.0000 mg | ORAL_TABLET | Freq: Once | ORAL | Status: AC
  Administered 2018-03-16: 60 mg via ORAL
  Filled 2018-03-16: qty 3

## 2018-03-16 MED ORDER — ALBUTEROL SULFATE (2.5 MG/3ML) 0.083% IN NEBU
5.0000 mg | INHALATION_SOLUTION | Freq: Once | RESPIRATORY_TRACT | Status: AC
  Administered 2018-03-16: 5 mg via RESPIRATORY_TRACT
  Filled 2018-03-16: qty 6

## 2018-03-16 MED ORDER — AZITHROMYCIN 250 MG PO TABS: ORAL_TABLET | ORAL | 0 refills | Status: AC

## 2018-03-16 MED ORDER — PREDNISONE 50 MG PO TABS: 50.0000 mg | ORAL_TABLET | Freq: Every day | ORAL | 0 refills | Status: AC

## 2018-03-16 MED ORDER — NAPROXEN 500 MG PO TABS
500.0000 mg | ORAL_TABLET | Freq: Once | ORAL | Status: AC
  Administered 2018-03-16: 500 mg via ORAL
  Filled 2018-03-16: qty 1

## 2018-03-16 MED ORDER — IPRATROPIUM-ALBUTEROL 0.5-2.5 (3) MG/3ML IN SOLN
3.0000 mL | Freq: Once | RESPIRATORY_TRACT | Status: AC
  Administered 2018-03-16: 3 mL via RESPIRATORY_TRACT
  Filled 2018-03-16: qty 3

## 2018-03-16 MED ORDER — IPRATROPIUM-ALBUTEROL 0.5-2.5 (3) MG/3ML IN SOLN
3.0000 mL | Freq: Once | RESPIRATORY_TRACT | Status: AC
  Administered 2018-03-16: 3 mL via RESPIRATORY_TRACT

## 2018-03-16 NOTE — ED Provider Notes (Signed)
[facility] Emergency Department Provider Note  ____________________________________________   First MD Initiated Contact with Patient 03/16/18 0020     (approximate)  I have reviewed the triage vital signs and the nursing notes.   HISTORY  Chief Complaint Chest Pain and Cough    HPI [name] is a 52 y.o. female who self presents to the emergency department with increasing cough and shortness of breath for the past 3 or 4 days.  She has a past medical history of COPD and is normally on 3 to 4 L of oxygen at home.  Her cough is been slightly productive and she is concerned that she might have "pneumonia again."  She is had no fevers or chills.  She does have sharp upper chest pain worse from coughing and improved when not.  She is used breathing treatments at home which has improved her symptoms.  Her symptoms are slightly worse with exertion.  She sleeps on 2 pillows and this is been chronic.  No gaining weight.  No bilateral lower extremity edema.    Past Medical History:  Diagnosis Date  . Anxiety   . COPD (chronic obstructive pulmonary disease) (HCC)   . Essential hypertension   . History of echocardiogram    a. 08/2013 Echo: EF 55-60%, impaired LV relaxation, mild LVH, nl RV fxn, nl RVSP, mild TR; b. 05/2017 Echo: EF 65-70%, no rwma, nl RV fxn.  . Morbid obesity (HCC)   . Tobacco abuse     Patient Active Problem List   Diagnosis Date Noted  . Chest pain 09/20/2017  . Pericardial effusion   . Acute on chronic respiratory failure with hypoxia (HCC) 07/29/2017  . Acute respiratory failure (HCC) 07/29/2017  . PNA (pneumonia) 06/07/2017    Past Surgical History:  Procedure Laterality Date  . ABDOMINAL HYSTERECTOMY    . CHOLECYSTECTOMY    . EXTUBATION (ENDOTRACHEAL) IN OR N/A 08/14/2017   Procedure: EXTUBATION (ENDOTRACHEAL) IN OR;  Surgeon: Geanie Logan, MD;  Location: [facility] ORS;  Service: ENT;  Laterality: N/A;  . HERNIA REPAIR    . TEE  WITHOUT CARDIOVERSION N/A 09/20/2017   Procedure: TRANSESOPHAGEAL ECHOCARDIOGRAM (TEE);  Surgeon: Laurier Nancy, MD;  Location: [facility] ORS;  Service: Cardiovascular;  Laterality: N/A;    Prior to Admission medications   Medication Sig Start Date End Date Taking? Authorizing Provider  albuterol (PROVENTIL HFA;VENTOLIN HFA) 108 (90 Base) MCG/ACT inhaler Inhale 2 puffs into the lungs every 6 (six) hours as needed for wheezing or shortness of breath. 06/12/17   Gouru, Deanna Artis, MD  azithromycin (ZITHROMAX Z-PAK) 250 MG tablet Take 2 tablets (500 mg) on  Day 1,  followed by 1 tablet (250 mg) once daily on Days 2 through 5. 03/16/18 03/21/18  Merrily Brittle, MD  budesonide (PULMICORT) 0.5 MG/2ML nebulizer solution Take 2 mLs (0.5 mg total) by nebulization 2 (two) times daily. 08/17/17   Katha Hamming, MD  clonazePAM (KLONOPIN) 0.5 MG tablet Take 0.5 mg by mouth 3 (three) times daily as needed for anxiety.     [provider]  cyclobenzaprine (FLEXERIL) 10 MG tablet Take 10 mg by mouth 3 (three) times daily as needed for muscle spasms.  08/16/16   [provider]  fluticasone (FLOVENT HFA) 44 MCG/ACT inhaler Inhale 2 puffs into the lungs daily.    [provider]  furosemide (LASIX) 20 MG tablet Take 1 tablet (20 mg total) by mouth daily. 08/17/17 08/17/18  Katha Hamming, MD  ipratropium-albuterol (DUONEB) 0.5-2.5 (  3) MG/3ML SOLN Take 3 mLs by nebulization every 6 (six) hours. Patient taking differently: Take 3 mLs by nebulization 2 (two) times daily.  08/17/17   Katha Hamming, MD  magic mouthwash w/lidocaine SOLN Take 5 mLs by mouth 4 (four) times daily. 11/16/17   Cuthriell, Delorise Royals, PA-C  omeprazole (PRILOSEC) 20 MG capsule Take 20 mg by mouth daily.    [provider]  predniSONE (DELTASONE) 50 MG tablet Take 1 tablet (50 mg total) by mouth daily for 4 days. 03/16/18 03/20/18  Merrily Brittle, MD    Allergies Patient has no known allergies.  Family History    Problem Relation Age of Onset  . Dementia Mother   . Heart attack Father     Social History Social History   Tobacco Use  . Smoking status: Current Every Day Smoker    Packs/day: 0.75    Years: 40.00    Pack years: 30.00    Types: Cigarettes  . Smokeless tobacco: Never Used  Substance Use Topics  . Alcohol use: No  . Drug use: No    Review of Systems Constitutional: No fever/chills Eyes: No visual changes. ENT: No sore throat. Cardiovascular: Positive for chest pain. Respiratory: Positive for shortness of breath. Gastrointestinal: No abdominal pain.  No nausea, no vomiting.  No diarrhea.  No constipation. Genitourinary: Negative for dysuria. Musculoskeletal: Negative for back pain. Skin: Negative for rash. Neurological: Negative for headaches, focal weakness or numbness.   ____________________________________________   PHYSICAL EXAM:  VITAL SIGNS: ED Triage Vitals  Enc Vitals Group     BP 03/16/18 0014 (!) 115/59     Pulse Rate 03/16/18 0014 (!) 110     Resp 03/16/18 0014 20     Temp 03/16/18 0014 98.9 F (37.2 C)     Temp Source 03/16/18 0014 Oral     SpO2 03/16/18 0014 96 %     Weight 03/16/18 0014 250 lb (113.4 kg)     Height 03/16/18 0014 5' (1.524 m)     Head Circumference --      Peak Flow --      Pain Score 03/16/18 0007 6     Pain Loc --      Pain Edu? --      Excl. in GC? --     Constitutional: Alert and oriented x4 appears somewhat short of breath although speaks in full clear sentences Eyes: PERRL EOMI. Head: Atraumatic. Nose: No congestion/rhinnorhea. Mouth/Throat: No trismus Neck: No stridor.  Able to lie completely flat with no JVD Cardiovascular: Tachycardic rate, regular rhythm. Grossly normal heart sounds.  Good peripheral circulation. Respiratory: Very minimally increased respiratory effort with expiratory wheezes throughout and moving good air Gastrointestinal: Obese soft nontender Musculoskeletal: Mild bilateral lower extremity  edema left greater than right Neurologic:  Normal speech and language. No gross focal neurologic deficits are appreciated. Skin:  Skin is warm, dry and intact. No rash noted. Psychiatric: Mood and affect are normal. Speech and behavior are normal.    ____________________________________________   DIFFERENTIAL includes but not limited to  COPD exacerbation, pneumonia, pneumothorax, acute coronary syndrome, pulmonary embolism, DVT ____________________________________________   LABS (all labs ordered are listed, but only abnormal results are displayed)  Labs Reviewed - No data to display   __________________________________________  EKG  ED ECG REPORT I, Merrily Brittle, the attending physician, personally viewed and interpreted this ECG.  Date: 03/16/2018 EKG Time:  Rate: 110 Rhythm: Sinus tachycardia QRS Axis: Rightward axis Intervals: normal ST/T Wave abnormalities: normal  Narrative Interpretation: no evidence of acute ischemia  ____________________________________________  RADIOLOGY  Chest x-ray reviewed by me with slight cardiomegaly and very minimal congestion although no infiltrate ____________________________________________   PROCEDURES  Procedure(s) performed: no  Procedures  Critical Care performed: no  ____________________________________________   INITIAL IMPRESSION / ASSESSMENT AND PLAN / ED COURSE  Pertinent labs & imaging results that were available during my care of the patient were reviewed by me and considered in my medical decision making (see chart for details).   As part of my medical decision making, I reviewed the following data within the electronic MEDICAL RECORD NUMBER History obtained from family if available, nursing notes, old chart and ekg, as well as notes from prior ED visits.  Patient comes to the emergency department somewhat short of breath with increasingly productive cough.  Chest x-ray today shows no infiltrate although does  show chronic cardiomegaly with trace edema.  She is able to lie completely flat.  She is somewhat wheezy.  Given 3 breathing treatments with significant improvement in her symptoms wheezes resolved.  She is able to lie completely flat and clinically she is not fluid overloaded.  She does have slight edema in her left lower extremity that she says is chronic and has been evaluated with ultrasounds multiple times.  Even though she has no infiltrate given her history of COPD I think is reasonable to treat her with steroids and antibiotics.  Strict return precautions were given and the patient verbalizes understanding agreement the plan.  Her oxygen requirement on discharge is identical to her oxygen requirement at home.      ____________________________________________   FINAL CLINICAL IMPRESSION(S) / ED DIAGNOSES  Final diagnoses:  COPD with acute exacerbation (HCC)      NEW MEDICATIONS STARTED DURING THIS VISIT:  Discharge Medication List as of 03/16/2018 12:58 AM    START taking these medications   Details  azithromycin (ZITHROMAX Z-PAK) 250 MG tablet Take 2 tablets (500 mg) on  Day 1,  followed by 1 tablet (250 mg) once daily on Days 2 through 5., Print         Note:  This document was prepared using Dragon voice recognition software and may include unintentional dictation errors.     Merrily Brittle, MD 03/16/18 [phone]

## 2018-03-16 NOTE — Discharge Instructions (Signed)
It was a pleasure to take care of you today, and thank you for coming to our emergency department.  If you have any questions or concerns before leaving please ask the nurse to grab me and I'm more than happy to go through your aftercare instructions again.  If you were prescribed any opioid pain medication today such as Norco, Vicodin, Percocet, morphine, hydrocodone, or oxycodone please make sure you do not drive when you are taking this medication as it can alter your ability to drive safely.  If you have any concerns once you are home that you are not improving or are in fact getting worse before you can make it to your follow-up appointment, please do not hesitate to call 911 and come back for further evaluation.  Merrily Brittle, MD  Results for orders placed or performed during the hospital encounter of 11/12/17  Group A Strep by PCR  Result Value Ref Range   Group A Strep by PCR NOT DETECTED NOT DETECTED   Dg Chest 2 View  Result Date: 03/16/2018 CLINICAL DATA:  52 year old female with chest pain EXAM: CHEST - 2 VIEW COMPARISON:  Chest radiograph dated 09/19/2017 FINDINGS: Mild diffuse interstitial coarsening and chronic bronchitic changes. No focal consolidation, pleural effusion, or pneumothorax. There is borderline cardiomegaly with mild vascular congestion. No acute osseous pathology. IMPRESSION: Borderline cardiomegaly with minimal vascular congestion. No focal consolidation. Electronically Signed   By: Elgie Collard M.D.   On: 03/16/2018 00:49

## 2018-03-16 NOTE — ED Triage Notes (Signed)
Pt reports hx of copd and pneumonia.Pt on O2 at 3 to 4 l/min via [place] all the time. Pt reports on Tuesday she started coughing and developed pain to her chest that is worse tonight.

## 2018-04-20 ENCOUNTER — Emergency Department: Payer: Medicaid Other

## 2018-04-20 ENCOUNTER — Encounter: Payer: Self-pay | Admitting: Emergency Medicine

## 2018-04-20 ENCOUNTER — Emergency Department
Admission: EM | Admit: 2018-04-20 | Discharge: 2018-04-20 | Disposition: A | Discharge status: Home / Self Care | Payer: Medicaid Other | Attending: Emergency Medicine | Admitting: Emergency Medicine

## 2018-04-20 ENCOUNTER — Other Ambulatory Visit: Payer: Self-pay

## 2018-04-20 DIAGNOSIS — F17210 Nicotine dependence, cigarettes, uncomplicated: Secondary | ICD-10-CM | POA: Diagnosis not present

## 2018-04-20 DIAGNOSIS — F4321 Adjustment disorder with depressed mood: Secondary | ICD-10-CM

## 2018-04-20 DIAGNOSIS — I10 Essential (primary) hypertension: Secondary | ICD-10-CM | POA: Insufficient documentation

## 2018-04-20 DIAGNOSIS — R079 Chest pain, unspecified: Secondary | ICD-10-CM | POA: Insufficient documentation

## 2018-04-20 LAB — CBC
HEMATOCRIT: 49.3 % — AB (ref 36.0–46.0)
HEMOGLOBIN: 15.5 g/dL — AB (ref 12.0–15.0)
MCH: 27.9 pg (ref 26.0–34.0)
MCHC: 31.4 g/dL (ref 30.0–36.0)
MCV: 88.7 fL (ref 80.0–100.0)
NRBC: 0 % (ref 0.0–0.2)
Platelets: 302 10*3/uL (ref 150–400)
RBC: 5.56 MIL/uL — AB (ref 3.87–5.11)
RDW: 13.9 % (ref 11.5–15.5)
WBC: 8.8 10*3/uL (ref 4.0–10.5)

## 2018-04-20 LAB — BASIC METABOLIC PANEL
ANION GAP: 8 (ref 5–15)
BUN: 13 mg/dL (ref 6–20)
CHLORIDE: 95 mmol/L — AB (ref 98–111)
CO2: 33 mmol/L — ABNORMAL HIGH (ref 22–32)
Calcium: 9.2 mg/dL (ref 8.9–10.3)
Creatinine, Ser: 0.85 mg/dL (ref 0.44–1.00)
GFR calc non Af Amer: >60 mL/min (ref >60)
Glucose, Bld: 110 mg/dL — ABNORMAL HIGH (ref 70–99)
Potassium: 4.3 mmol/L (ref 3.5–5.1)
SODIUM: 136 mmol/L (ref 135–145)

## 2018-04-20 LAB — TROPONIN I: Troponin I: <0.03 ng/mL (ref <0.03)

## 2018-04-20 NOTE — ED Triage Notes (Signed)
Pt to ed with c/o chest pain that started this am after her mother died.  Pt states she was upset and then her chest started hurting.  Pt denies sob, denies weakness.

## 2018-04-20 NOTE — ED Notes (Signed)
E signature pad not working 

## 2018-04-20 NOTE — ED Provider Notes (Signed)
[facility] Emergency Department Provider Note       Time seen: ----------------------------------------- 5:38 PM on 04/20/2018 -----------------------------------------   I have reviewed the triage vital signs and the nursing notes.  HISTORY   Chief Complaint Chest Pain    HPI [name] is a 52 y.o. female with a history of anxiety, COPD, hypertension, morbid obesity, tobacco abuse, pericardial effusion, respiratory failure who presents to the ED for chest pain that started last night that was worse with movement.  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  She states she is upset and that her chest is hurting.   Past Medical History:  Diagnosis Date  . Anxiety   . COPD (chronic obstructive pulmonary disease) (HCC)   . Essential hypertension   . History of echocardiogram    a. 08/2013 Echo: EF 55-60%, impaired LV relaxation, mild LVH, nl RV fxn, nl RVSP, mild TR; b. 05/2017 Echo: EF 65-70%, no rwma, nl RV fxn.  . Morbid obesity (HCC)   . Tobacco abuse     Patient Active Problem List   Diagnosis Date Noted  . Chest pain 09/20/2017  . Pericardial effusion   . Acute on chronic respiratory failure with hypoxia (HCC) 07/29/2017  . Acute respiratory failure (HCC) 07/29/2017  . PNA (pneumonia) 06/07/2017    Past Surgical History:  Procedure Laterality Date  . ABDOMINAL HYSTERECTOMY    . CHOLECYSTECTOMY    . EXTUBATION (ENDOTRACHEAL) IN OR N/A 08/14/2017   Procedure: EXTUBATION (ENDOTRACHEAL) IN OR;  Surgeon: Geanie LoganBennett, Paul, MD;  Location: [facility] ORS;  Service: ENT;  Laterality: N/A;  . HERNIA REPAIR    . TEE WITHOUT CARDIOVERSION N/A 09/20/2017   Procedure: TRANSESOPHAGEAL ECHOCARDIOGRAM (TEE);  Surgeon: Laurier NancyKhan, Shaukat A, MD;  Location: [facility] ORS;  Service: Cardiovascular;  Laterality: N/A;    Allergies Patient has no known  allergies.  Social History Social History   Tobacco Use  . Smoking status: Current Every Day Smoker    Packs/day: 0.75    Years: 40.00    Pack years: 30.00    Types: Cigarettes  . Smokeless tobacco: Never Used  Substance Use Topics  . Alcohol use: No  . Drug use: No   Review of Systems Constitutional: Negative for fever. Cardiovascular: Positive for chest pain Respiratory: Negative for shortness of breath. Gastrointestinal: Negative for abdominal pain, vomiting and diarrhea. Musculoskeletal: Negative for back pain. Skin: Negative for rash. Neurological: Negative for headaches, focal weakness or numbness.  All systems negative/normal/unremarkable except as stated in the HPI  ____________________________________________   PHYSICAL EXAM:  VITAL SIGNS: ED Triage Vitals  Enc Vitals Group     BP 04/20/18 1722 122/78     Pulse Rate 04/20/18 1359 100     Resp 04/20/18 1359 18     Temp 04/20/18 1359 98.4 F (36.9 C)     Temp Source 04/20/18 1359 Oral     SpO2 04/20/18 1359 96 %     Weight 04/20/18 1405 265 lb (120.2 kg)     Height 04/20/18 1405 5' (1.524 m)     Head Circumference --      Peak Flow --      Pain Score 04/20/18 1405 5     Pain Loc --      Pain Edu? --      Excl. in GC? --    Constitutional: Alert and oriented. Well  appearing and in no distress. ENT   Head: Normocephalic and atraumatic.   Nose: No congestion/rhinnorhea.   Mouth/Throat: Mucous membranes are moist.   Neck: No stridor. Cardiovascular: Normal rate, regular rhythm. No murmurs, rubs, or gallops. Respiratory: Normal respiratory effort without tachypnea nor retractions. Breath sounds are clear and equal bilaterally. No wheezes/rales/rhonchi. Gastrointestinal: Soft and nontender. Normal bowel sounds Musculoskeletal: Nontender with normal range of motion in extremities. No lower extremity tenderness nor edema. Neurologic:  Normal speech and language. No gross focal neurologic  deficits are appreciated.  Skin:  Skin is warm, dry and intact. No rash noted. Psychiatric: Mood and affect are normal. Speech and behavior are normal.  ____________________________________________  EKG: Interpreted by me.  Sinus tachycardia with a rate of 102 bpm, normal PR interval, normal QRS, normal QT ____________________________________________  ED COURSE:  As part of my medical decision making, I reviewed the following data within the electronic MEDICAL RECORD NUMBER History obtained from family if available, nursing notes, old chart and ekg, as well as notes from prior ED visits. Patient presented for chest pain, we will assess with labs and imaging as indicated at this time.   Procedures ____________________________________________   LABS (pertinent positives/negatives)  Labs Reviewed  BASIC METABOLIC PANEL - Abnormal; Notable for the following components:      Result Value   Chloride 95 (*)    CO2 33 (*)    Glucose, Bld 110 (*)    All other components within normal limits  CBC - Abnormal; Notable for the following components:   RBC 5.56 (*)    Hemoglobin 15.5 (*)    HCT 49.3 (*)    All other components within normal limits  TROPONIN I    RADIOLOGY  Chest x-ray IMPRESSION: Stable cardiac enlargement, dilated main pulmonary artery segment and chronic lung disease. Atelectasis at the left lung base with potential small left pleural effusion.  ____________________________________________  DIFFERENTIAL DIAGNOSIS   Musculoskeletal pain, anxiety, acute grief reaction, unstable angina, PE unlikely  FINAL ASSESSMENT AND PLAN  Chest pain   Plan: The patient had presented for chest pain which appears to be either musculoskeletal and/or grief related. Patient's labs are reassuring. Patient's imaging did not reveal any acute process.  She is cleared for outpatient follow-up, I have advised her taking her Klonopin as needed at this time.   [name],  MD   Note: This note was generated in part or whole with voice recognition software. Voice recognition is usually quite accurate but there are transcription errors that can and very often do occur. I apologize for any typographical errors that were not detected and corrected.     Emily Filbert, MD 04/20/18 [phone]

## 2018-04-20 NOTE — ED Notes (Signed)
Pt's mom passed away this morning. 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

## 2018-05-11 ENCOUNTER — Other Ambulatory Visit: Payer: Self-pay

## 2018-05-11 ENCOUNTER — Encounter: Payer: Self-pay | Admitting: *Deleted

## 2018-05-11 ENCOUNTER — Emergency Department: Payer: Medicaid Other

## 2018-05-11 ENCOUNTER — Emergency Department
Admission: EM | Admit: 2018-05-11 | Discharge: 2018-05-11 | Disposition: A | Discharge status: Home / Self Care | Payer: Medicaid Other | Attending: Emergency Medicine | Admitting: Emergency Medicine

## 2018-05-11 DIAGNOSIS — J209 Acute bronchitis, unspecified: Secondary | ICD-10-CM

## 2018-05-11 DIAGNOSIS — Z79899 Other long term (current) drug therapy: Secondary | ICD-10-CM | POA: Diagnosis not present

## 2018-05-11 DIAGNOSIS — F17210 Nicotine dependence, cigarettes, uncomplicated: Secondary | ICD-10-CM | POA: Diagnosis not present

## 2018-05-11 DIAGNOSIS — R05 Cough: Secondary | ICD-10-CM | POA: Diagnosis present

## 2018-05-11 DIAGNOSIS — I10 Essential (primary) hypertension: Secondary | ICD-10-CM | POA: Diagnosis not present

## 2018-05-11 DIAGNOSIS — J449 Chronic obstructive pulmonary disease, unspecified: Secondary | ICD-10-CM | POA: Diagnosis not present

## 2018-05-11 DIAGNOSIS — J40 Bronchitis, not specified as acute or chronic: Secondary | ICD-10-CM | POA: Diagnosis not present

## 2018-05-11 LAB — CBC
HCT: 51.9 % — ABNORMAL HIGH (ref 36.0–46.0)
Hemoglobin: 15.8 g/dL — ABNORMAL HIGH (ref 12.0–15.0)
MCH: 27.2 pg (ref 26.0–34.0)
MCHC: 30.4 g/dL (ref 30.0–36.0)
MCV: 89.3 fL (ref 80.0–100.0)
PLATELETS: 334 10*3/uL (ref 150–400)
RBC: 5.81 MIL/uL — ABNORMAL HIGH (ref 3.87–5.11)
RDW: 14 % (ref 11.5–15.5)
WBC: 8.8 10*3/uL (ref 4.0–10.5)
nRBC: 0 % (ref 0.0–0.2)

## 2018-05-11 LAB — BASIC METABOLIC PANEL
Anion gap: 9 (ref 5–15)
BUN: 15 mg/dL (ref 6–20)
CALCIUM: 8.6 mg/dL — AB (ref 8.9–10.3)
CO2: 27 mmol/L (ref 22–32)
CREATININE: 0.9 mg/dL (ref 0.44–1.00)
Chloride: 100 mmol/L (ref 98–111)
Glucose, Bld: 110 mg/dL — ABNORMAL HIGH (ref 70–99)
Potassium: 4.4 mmol/L (ref 3.5–5.1)
SODIUM: 136 mmol/L (ref 135–145)

## 2018-05-11 LAB — TROPONIN I

## 2018-05-11 MED ORDER — ALBUTEROL SULFATE (2.5 MG/3ML) 0.083% IN NEBU
INHALATION_SOLUTION | RESPIRATORY_TRACT | Status: AC
  Filled 2018-05-11: qty 6

## 2018-05-11 MED ORDER — ALBUTEROL SULFATE (2.5 MG/3ML) 0.083% IN NEBU
5.0000 mg | INHALATION_SOLUTION | Freq: Once | RESPIRATORY_TRACT | Status: AC
  Administered 2018-05-11: 5 mg via RESPIRATORY_TRACT

## 2018-05-11 MED ORDER — AZITHROMYCIN 250 MG PO TABS: ORAL_TABLET | ORAL | 0 refills | Status: AC

## 2018-05-11 MED ORDER — IPRATROPIUM-ALBUTEROL 0.5-2.5 (3) MG/3ML IN SOLN
3.0000 mL | Freq: Once | RESPIRATORY_TRACT | Status: AC
  Administered 2018-05-11: 3 mL via RESPIRATORY_TRACT
  Filled 2018-05-11: qty 3

## 2018-05-11 MED ORDER — PREDNISONE 20 MG PO TABS: 60.0000 mg | ORAL_TABLET | Freq: Every day | ORAL | 0 refills | Status: AC

## 2018-05-11 MED ORDER — PREDNISONE 20 MG PO TABS
60.0000 mg | ORAL_TABLET | Freq: Once | ORAL | Status: AC
  Administered 2018-05-11: 60 mg via ORAL
  Filled 2018-05-11: qty 3

## 2018-05-11 MED ORDER — AZITHROMYCIN 500 MG PO TABS
500.0000 mg | ORAL_TABLET | Freq: Once | ORAL | Status: AC
  Administered 2018-05-11: 500 mg via ORAL
  Filled 2018-05-11: qty 1

## 2018-05-11 NOTE — ED Triage Notes (Signed)
Pt reports that for about 4 days she has had a dry cough, has felt "a little" more short of breath than normal and tonight started having pain in her back around her lungs on both sides. No fevers. Several family members at home have also been sick. Pt normally wears 4 liters oxygen continuous, but has her tanks turned down to 2 liters.

## 2018-05-11 NOTE — ED Notes (Signed)
Patient transported to X-ray 

## 2018-05-11 NOTE — ED Provider Notes (Addendum)
[facility] Emergency Department Provider Note _________   First MD Initiated Contact with Patient 05/11/18 0250     (approximate)  I have reviewed the triage vital signs and the nursing notes.   HISTORY  Chief Complaint Cough   HPI [name] is a 52 y.o. female with below list of chronic medical conditions including COPD presents to the emergency department a 4-day history of dry nonproductive cough and dyspnea which has worsened tonight.  Patient also admits to bilateral lateral chest discomfort worse with coughing.  Patient denies any fever afebrile on presentation temperature 98.7.  Patient does admit to multiple family members at home that have had a upper respiratory infection.  Patient does admit to receiving the flu vaccine this year as well as her pneumonia vaccination.   Past Medical History:  Diagnosis Date  . Anxiety   . COPD (chronic obstructive pulmonary disease) (HCC)   . Essential hypertension   . History of echocardiogram    a. 08/2013 Echo: EF 55-60%, impaired LV relaxation, mild LVH, nl RV fxn, nl RVSP, mild TR; b. 05/2017 Echo: EF 65-70%, no rwma, nl RV fxn.  . Morbid obesity (HCC)   . Tobacco abuse     Patient Active Problem List   Diagnosis Date Noted  . Chest pain 09/20/2017  . Pericardial effusion   . Acute on chronic respiratory failure with hypoxia (HCC) 07/29/2017  . Acute respiratory failure (HCC) 07/29/2017  . PNA (pneumonia) 06/07/2017    Past Surgical History:  Procedure Laterality Date  . ABDOMINAL HYSTERECTOMY    . CHOLECYSTECTOMY    . EXTUBATION (ENDOTRACHEAL) IN OR N/A 08/14/2017   Procedure: EXTUBATION (ENDOTRACHEAL) IN OR;  Surgeon: Geanie LoganBennett, Paul, MD;  Location: [facility] ORS;  Service: ENT;  Laterality: N/A;  . HERNIA REPAIR    . TEE WITHOUT CARDIOVERSION N/A 09/20/2017   Procedure: TRANSESOPHAGEAL ECHOCARDIOGRAM (TEE);  Surgeon: Laurier NancyKhan, Shaukat A, MD;  Location: [facility] ORS;  Service: Cardiovascular;  Laterality:  N/A;    Prior to Admission medications   Medication Sig Start Date End Date Taking? Authorizing Provider  albuterol (PROVENTIL HFA;VENTOLIN HFA) 108 (90 Base) MCG/ACT inhaler Inhale 2 puffs into the lungs every 6 (six) hours as needed for wheezing or shortness of breath. 06/12/17   Gouru, Aruna, MD  budesonide (PULMICORT) 0.5 MG/2ML nebulizer solution Take 2 mLs (0.5 mg total) by nebulization 2 (two) times daily. 08/17/17   Katha HammingKonidena, Snehalatha, MD  clonazePAM (KLONOPIN) 0.5 MG tablet Take 0.5 mg by mouth 3 (three) times daily as needed for anxiety.     [provider]  cyclobenzaprine (FLEXERIL) 10 MG tablet Take 10 mg by mouth 3 (three) times daily as needed for muscle spasms.  08/16/16   [provider]  fluticasone (FLOVENT HFA) 44 MCG/ACT inhaler Inhale 2 puffs into the lungs daily.    [provider]  furosemide (LASIX) 20 MG tablet Take 1 tablet (20 mg total) by mouth daily. 08/17/17 08/17/18  Katha HammingKonidena, Snehalatha, MD  ipratropium-albuterol (DUONEB) 0.5-2.5 (3) MG/3ML SOLN Take 3 mLs by nebulization every 6 (six) hours. Patient taking differently: Take 3 mLs by nebulization 2 (two) times daily.  08/17/17   Katha HammingKonidena, Snehalatha, MD  magic mouthwash w/lidocaine SOLN Take 5 mLs by mouth 4 (four) times daily. 11/16/17   Cuthriell, Delorise RoyalsJonathan D, PA-C  omeprazole (PRILOSEC) 20 MG capsule Take 20 mg by mouth daily.    [provider]    Allergies No known drug allergies  Family History  Problem  Relation Age of Onset  . Dementia Mother   . Heart attack Father     Social History Social History   Tobacco Use  . Smoking status: Current Every Day Smoker    Packs/day: 0.75    Years: 40.00    Pack years: 30.00    Types: Cigarettes  . Smokeless tobacco: Never Used  Substance Use Topics  . Alcohol use: No  . Drug use: No    Review of Systems Constitutional: No fever/chills Eyes: No visual changes. ENT: No sore throat. Cardiovascular: Denies chest  pain. Respiratory:  Positive for cough and dyspnea Gastrointestinal: No abdominal pain.  No nausea, no vomiting.  No diarrhea.  No constipation. Genitourinary: Negative for dysuria. Musculoskeletal: Negative for neck pain.  Negative for back pain. Integumentary: Negative for rash. Neurological: Negative for headaches, focal weakness or numbness.   ____________________________________________   PHYSICAL EXAM:  VITAL SIGNS: ED Triage Vitals  Enc Vitals Group     BP 05/11/18 0131 119/66     Pulse Rate 05/11/18 0131 97     Resp 05/11/18 0131 20     Temp 05/11/18 0131 98.7 F (37.1 C)     Temp Source 05/11/18 0131 Oral     SpO2 05/11/18 0131 93 %     Weight --      Height --      Head Circumference --      Peak Flow --      Pain Score 05/11/18 0129 4     Pain Loc --      Pain Edu? --      Excl. in GC? --     Constitutional: Alert and oriented. Well appearing and in no acute distress. Eyes: Conjunctivae are normal.  Mouth/Throat: Mucous membranes are moist. Oropharynx non-erythematous. Neck: No stridor.   Cardiovascular: Normal rate, regular rhythm. Good peripheral circulation. Grossly normal heart sounds. Respiratory: Normal respiratory effort.  No retractions.  Mild expiratory wheezing Gastrointestinal: Soft and nontender. No distention.  Musculoskeletal: No lower extremity tenderness nor edema. No gross deformities of extremities. Neurologic:  Normal speech and language. No gross focal neurologic deficits are appreciated.  Skin:  Skin is warm, dry and intact. No rash noted. Psychiatric: Mood and affect are normal. Speech and behavior are normal.  ____________________________________________   LABS (all labs ordered are listed, but only abnormal results are displayed)  Labs Reviewed  BASIC METABOLIC PANEL - Abnormal; Notable for the following components:      Result Value   Glucose, Bld 110 (*)    Calcium 8.6 (*)    All other components within normal limits  CBC -  Abnormal; Notable for the following components:   RBC 5.81 (*)    Hemoglobin 15.8 (*)    HCT 51.9 (*)    All other components within normal limits  TROPONIN I   ____________________________________________  EKG  ED ECG REPORT I, Green N BROWN, the attending physician, personally viewed and interpreted this ECG.   Date: 05/11/2018  EKG Time: 1:34 AM  Rate: 96  Rhythm: Normal sinus rhythm  Axis: Normal  Intervals: Normal  ST&T Change: None  ____________________________________________  RADIOLOGY I, Frederick N BROWN, personally viewed and evaluated these images (plain radiographs) as part of my medical decision making, as well as reviewing the written report by the radiologist.  ED MD interpretation: Mild increase pulmonary interstitium unchanged in comparison to previous study per radiologist.  Official radiology report(s): Dg Chest 2 View  Result Date: 05/11/2018 CLINICAL DATA:  Dry  cough for 4 days EXAM: CHEST - 2 VIEW COMPARISON:  April 20, 2018 FINDINGS: The heart size and mediastinal contours are stable. Mild increased pulmonary interstitium is identified in both lungs unchanged. There is no focal pneumonia or pleural effusion. The visualized skeletal structures are stable. IMPRESSION: Mild increased pulmonary interstitium is unchanged compared to prior exam probably chronic. No focal pneumonia. Electronically Signed   By: Sherian ReinWei-Chen  Lin M.D.   On: 05/11/2018 01:56     Procedures   ____________________________________________   INITIAL IMPRESSION / ASSESSMENT AND PLAN / ED COURSE  As part of my medical decision making, I reviewed the following data within the electronic MEDICAL RECORD NUMBER 52 year old female presenting with above-stated history and physical exam consistent with acute on chronic bronchitis.  Concern for possible pneumonia and as such chest x-ray was performed which revealed no evidence of pneumonia radiologist.  Patient given azithromycin and  prednisone as well as a DuoNeb treatment in the emergency department with symptomatic improvement.  Patient be prescribed azithromycin and prednisone for home.  Spoke with the patient at length adding warning signs that would warrant return to the emergency department. ____________________________________________  FINAL CLINICAL IMPRESSION(S) / ED DIAGNOSES  Final diagnoses:  Bronchitis     MEDICATIONS GIVEN DURING THIS VISIT:  Medications  azithromycin (ZITHROMAX) tablet 500 mg (has no administration in time range)  ipratropium-albuterol (DUONEB) 0.5-2.5 (3) MG/3ML nebulizer solution 3 mL (has no administration in time range)  predniSONE (DELTASONE) tablet 60 mg (has no administration in time range)  albuterol (PROVENTIL) (2.5 MG/3ML) 0.083% nebulizer solution 5 mg (5 mg Nebulization Given 05/11/18 0138)     ED Discharge Orders    None       Note:  This document was prepared using Dragon voice recognition software and may include unintentional dictation errors.    Darci CurrentBrown, [place] N, MD 05/11/18 [id number]    Darci CurrentBrown, [place] N, MD 05/11/18 [phone]

## 2018-07-06 ENCOUNTER — Other Ambulatory Visit: Payer: Self-pay

## 2018-07-06 ENCOUNTER — Emergency Department
Admission: EM | Admit: 2018-07-06 | Discharge: 2018-07-06 | Discharge status: Against Medical Advice | Payer: Medicaid Other | Attending: Emergency Medicine | Admitting: Emergency Medicine

## 2018-07-06 ENCOUNTER — Emergency Department: Payer: Medicaid Other

## 2018-07-06 DIAGNOSIS — Z5321 Procedure and treatment not carried out due to patient leaving prior to being seen by health care provider: Secondary | ICD-10-CM | POA: Diagnosis not present

## 2018-07-06 DIAGNOSIS — R079 Chest pain, unspecified: Secondary | ICD-10-CM | POA: Insufficient documentation

## 2018-07-06 LAB — BASIC METABOLIC PANEL
Anion gap: 7 (ref 5–15)
BUN: 9 mg/dL (ref 6–20)
CO2: 33 mmol/L — ABNORMAL HIGH (ref 22–32)
Calcium: 8.7 mg/dL — ABNORMAL LOW (ref 8.9–10.3)
Chloride: 99 mmol/L (ref 98–111)
Creatinine, Ser: 0.86 mg/dL (ref 0.44–1.00)
GFR calc Af Amer: >60 mL/min (ref >60)
GFR calc non Af Amer: >60 mL/min (ref >60)
Glucose, Bld: 153 mg/dL — ABNORMAL HIGH (ref 70–99)
Potassium: 3.5 mmol/L (ref 3.5–5.1)
Sodium: 139 mmol/L (ref 135–145)

## 2018-07-06 LAB — CBC
HCT: 49.9 % — ABNORMAL HIGH (ref 36.0–46.0)
HEMOGLOBIN: 15.1 g/dL — AB (ref 12.0–15.0)
MCH: 26.4 pg (ref 26.0–34.0)
MCHC: 30.3 g/dL (ref 30.0–36.0)
MCV: 87.1 fL (ref 80.0–100.0)
Platelets: 279 10*3/uL (ref 150–400)
RBC: 5.73 MIL/uL — ABNORMAL HIGH (ref 3.87–5.11)
RDW: 15.9 % — ABNORMAL HIGH (ref 11.5–15.5)
WBC: 9.5 10*3/uL (ref 4.0–10.5)
nRBC: 0 % (ref 0.0–0.2)

## 2018-07-06 LAB — TROPONIN I: Troponin I: <0.03 ng/mL (ref <0.03)

## 2018-07-06 NOTE — ED Triage Notes (Signed)
First Nurse Note:  Arrives with c/o cough, sob, and painful cough x 3 days.  Patient has history of COPD and wears home oxygen.  AAOx3.  Skin warm and dry.  NAD

## 2018-07-06 NOTE — ED Triage Notes (Signed)
Patient reports having right sided chest pain since this morning.  Reports also has cough.  Reports pains feels similar to when she had pneumonia or possibly pulled muscle.

## 2018-07-08 ENCOUNTER — Telehealth: Payer: Self-pay | Admitting: Emergency Medicine

## 2018-07-08 NOTE — Telephone Encounter (Signed)
Called patient due to lwot to inquire about condition and follow up plans.  Says she is doing okay.  Has talked to her pcp and will make appt if needed.

## 2019-08-11 IMAGING — CR DG CHEST 2V
2 series · 2 of 2 positions shown · non-contrast
Comparison: Chest x-ray 06/10/2017.

CLINICAL DATA: 51-year-old female with history of chest pain since
last week. Pain most severe during deep breathing.

EXAM:
CHEST - 2 VIEW

[chest pa]
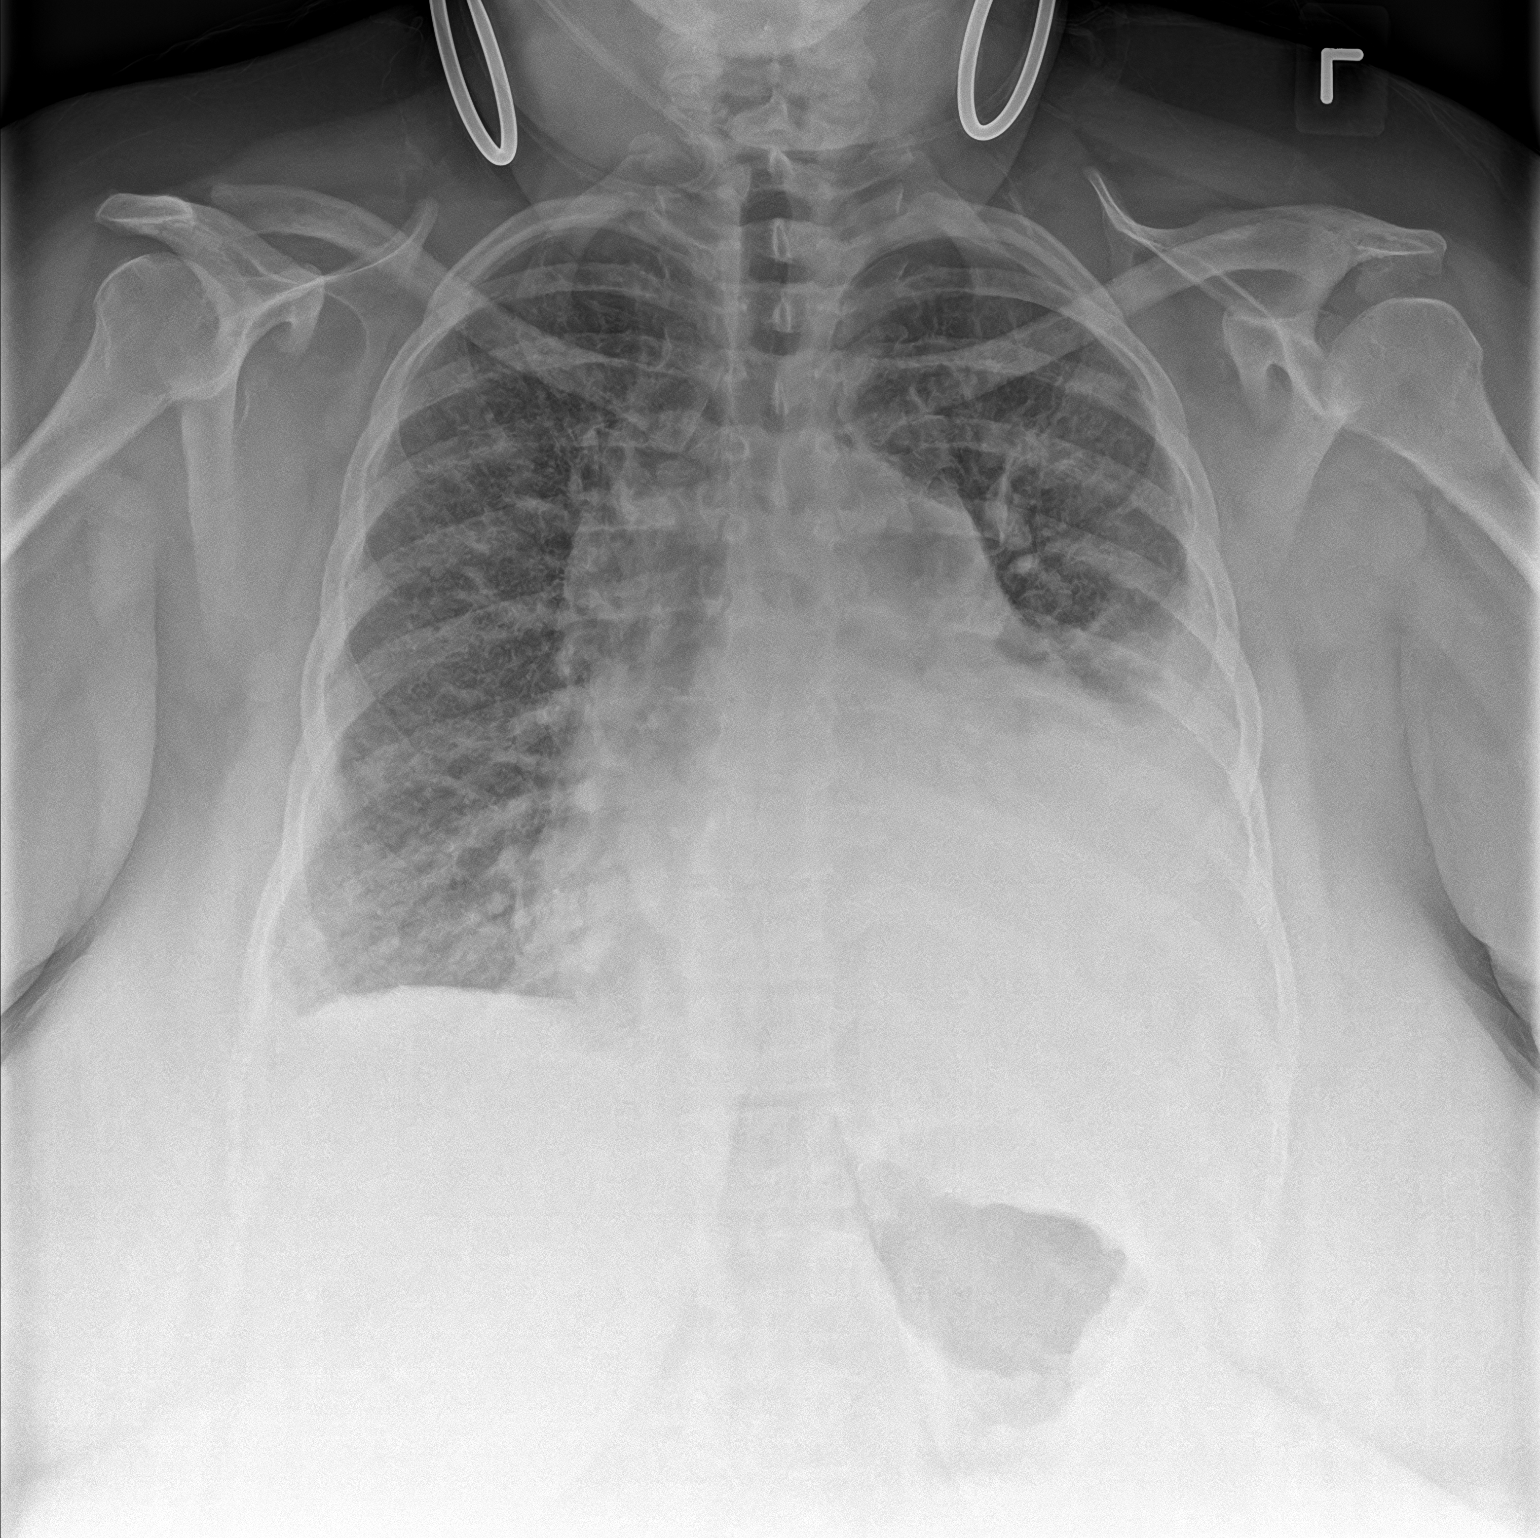

[chest lat]
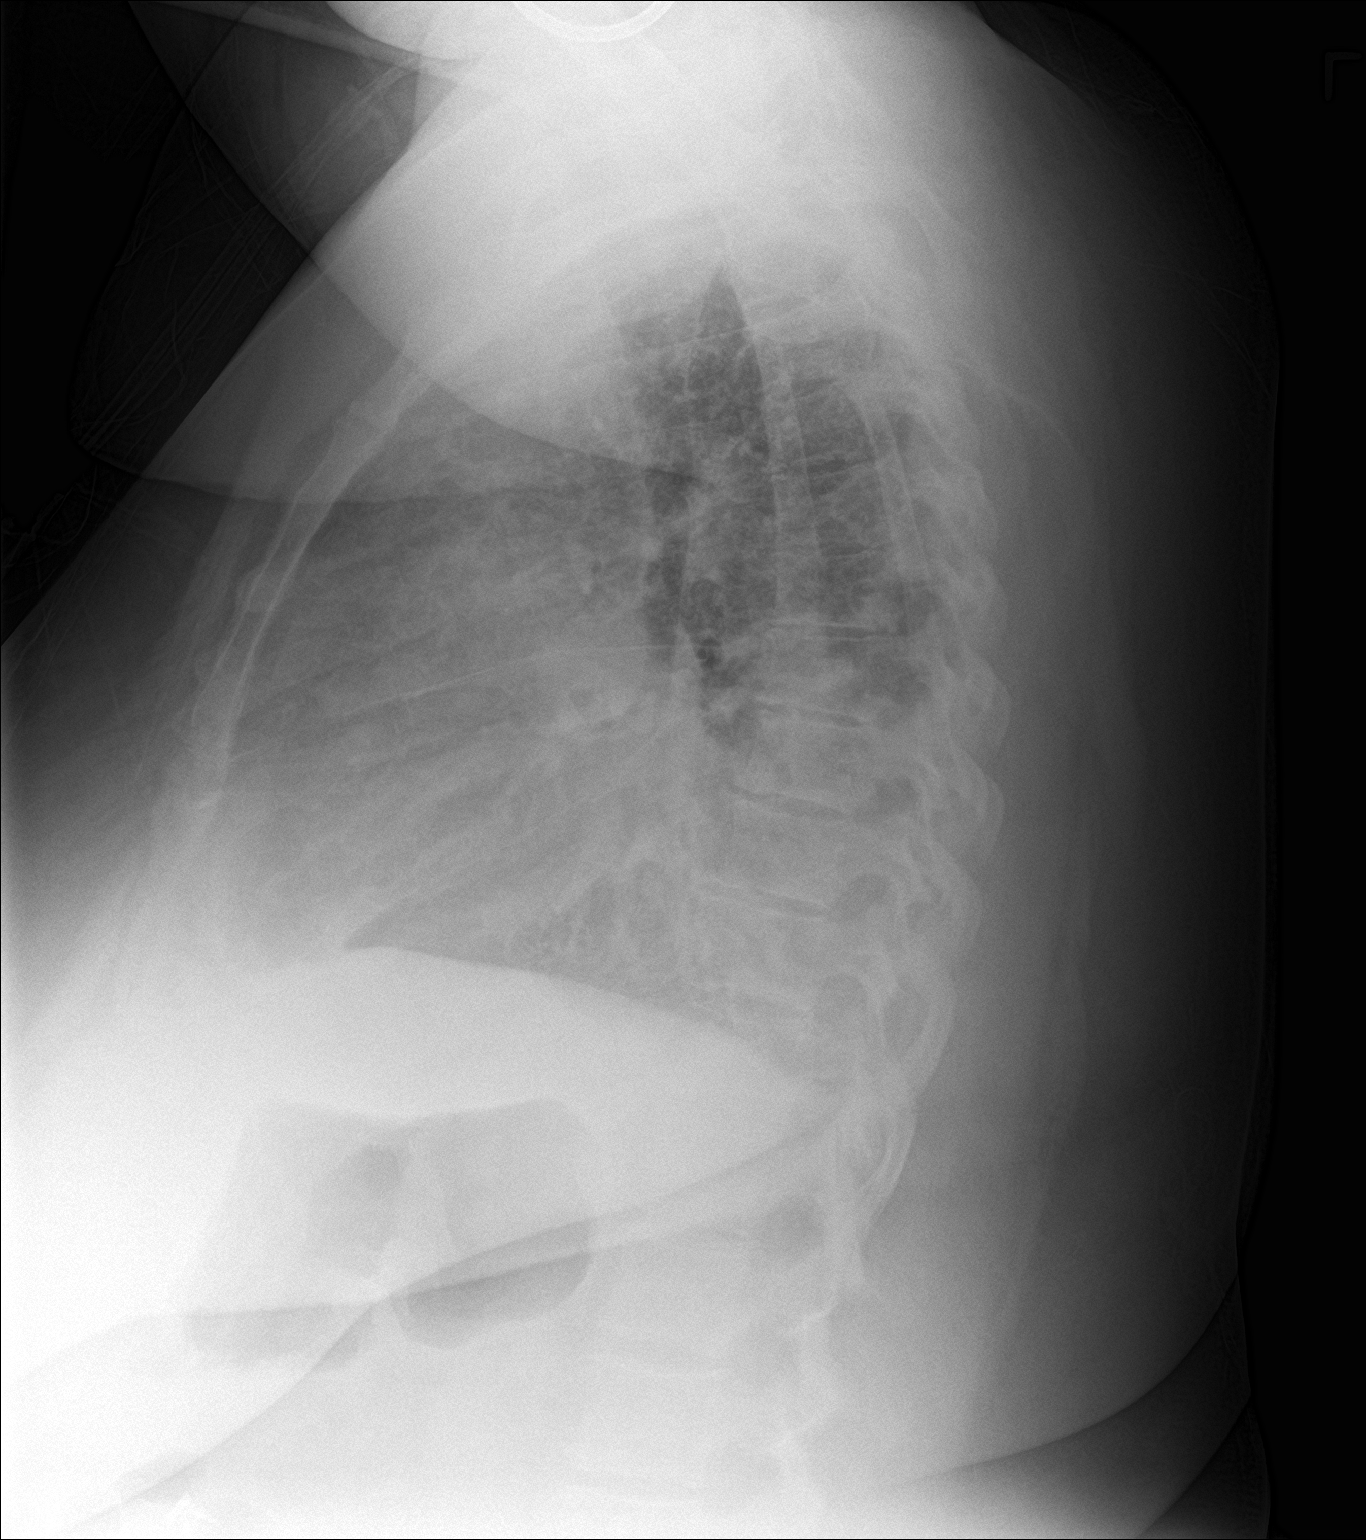

[2 of 2 positions shown; findings below may reference images not displayed]

FINDINGS: Left lower lobe airspace consolidation concerning for pneumonia.
Small bilateral pleural effusions. Mild diffuse peribronchial
cuffing. Mild cardiomegaly. Upper mediastinal contours appear
widened, but are stable compared to prior examinations.
IMPRESSION: 1. Left lower lobe pneumonia with small bilateral pleural effusions.
Followup PA and lateral chest X-ray is recommended in 3-4 weeks
following trial of antibiotic therapy to ensure resolution and
exclude underlying malignancy.
2. Mild cardiomegaly.

## 2019-08-13 IMAGING — DX DG CHEST 1V PORT
1 series · 1 of 1 positions shown · non-contrast
Comparison: 07/30/2017

CLINICAL DATA: Acute respiratory failure

EXAM:
PORTABLE CHEST 1 VIEW

[chest ap]
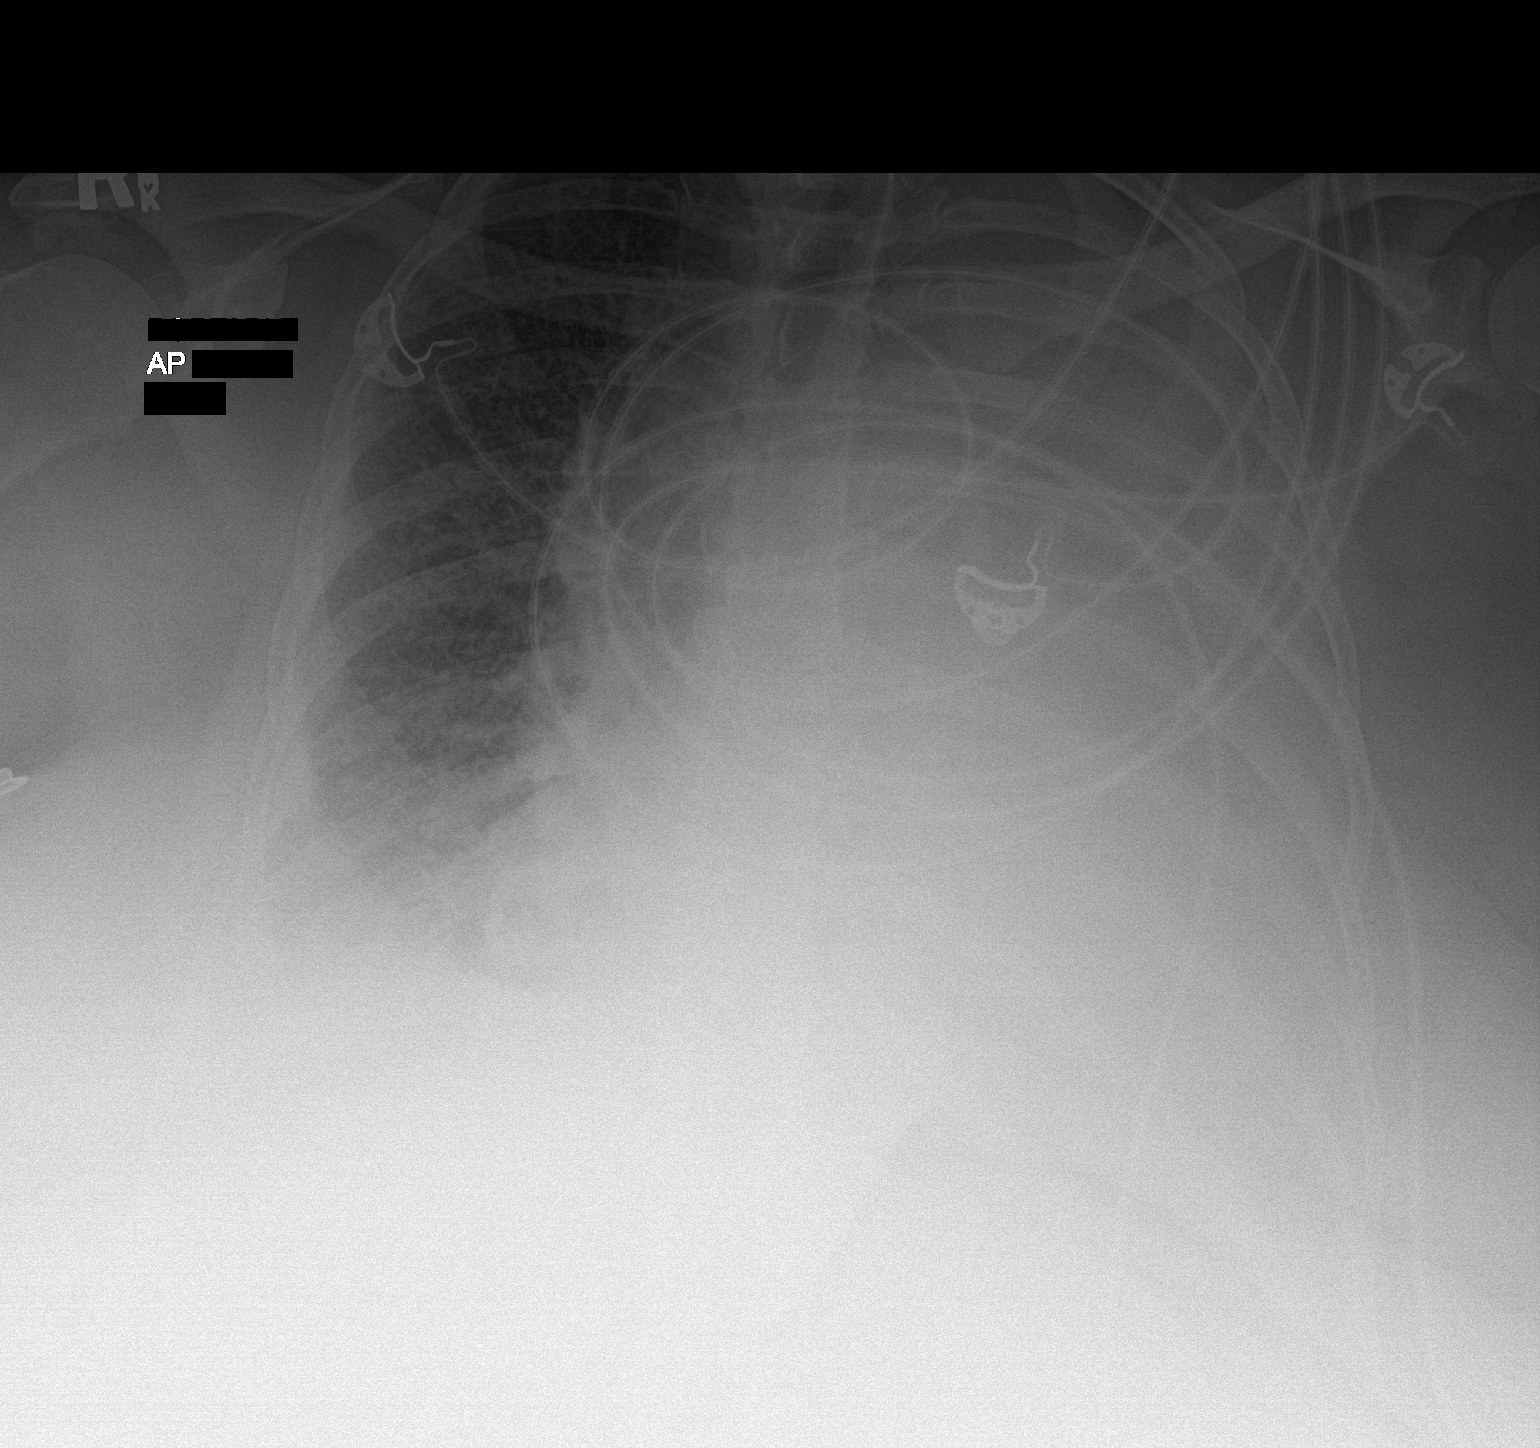

[1 of 1 positions shown; findings below may reference images not displayed]

FINDINGS: Dense consolidation of the left hemithorax unchanged due to effusion
and collapse.

Progression of right lower lobe atelectasis/infiltrate. Diffuse
airspace disease on the right may represent edema.
IMPRESSION: Persistent collapse of the left lung

Progressive right lower lobe atelectasis/infiltrate and small right
effusion.

## 2019-08-13 IMAGING — DX DG CHEST 1V PORT
1 series · 1 of 1 positions shown · non-contrast
Comparison: Chest x-ray from same day at 7059.

CLINICAL DATA: Intubation and central line placement.

EXAM:
PORTABLE CHEST 1 VIEW

[chest ap]
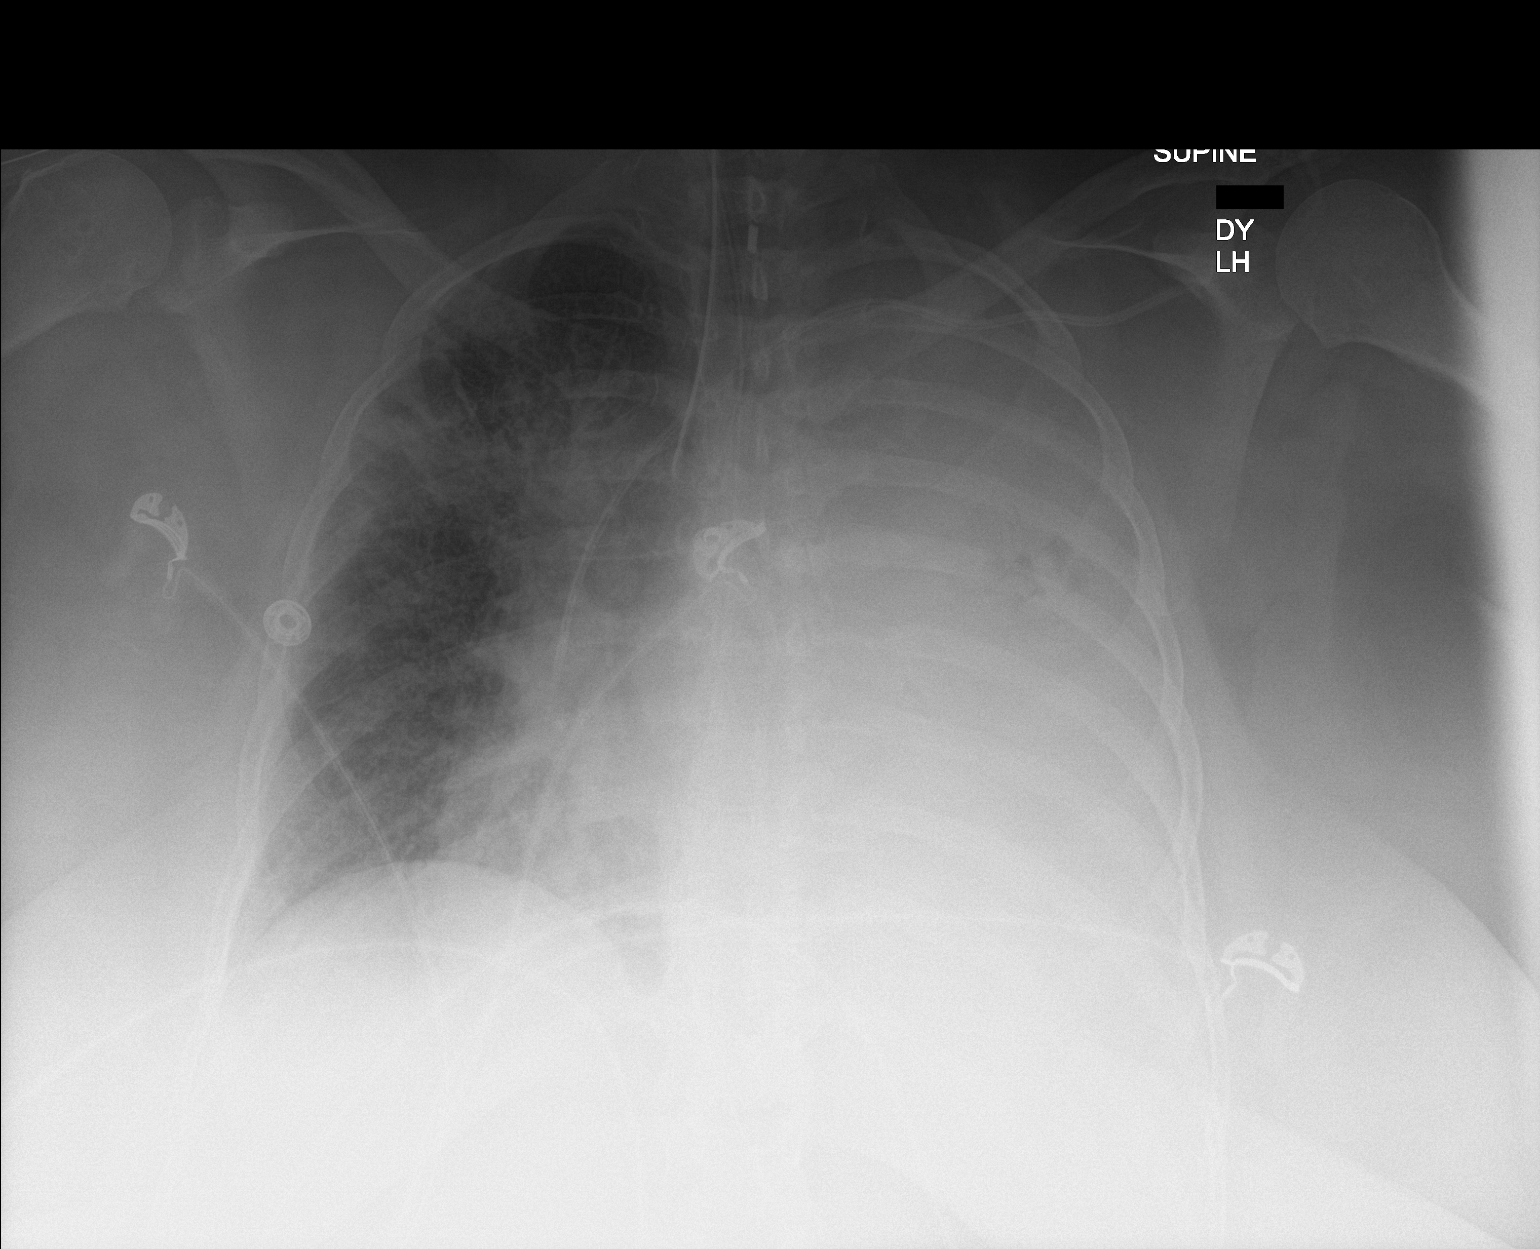

[1 of 1 positions shown; findings below may reference images not displayed]

FINDINGS: Interval placement of an endotracheal tube with the tip
approximately 1 cm above the level of the carina. Enteric tube seen
entering the stomach. Left subclavian central venous catheter with
the tip at the cavoatrial junction.

Stable cardiomegaly. Unchanged complete opacification of the left
hemithorax due to large pleural effusion and left lung collapse.
Increasing airspace disease within the right upper lobe. No acute
osseous abnormality.
IMPRESSION: 1. Interval placement of an endotracheal tube with the tip
approximately 1 cm above the level of the carina. Recommend
retraction 2-3 cm.
2. Appropriately positioned left subclavian central venous catheter.
3. Unchanged large left pleural effusion and left lung collapse.
4. Worsening airspace disease in the right upper lobe which could
reflect edema or infection.

## 2019-12-20 ENCOUNTER — Inpatient Hospital Stay
Admission: EM | Admit: 2019-12-20 | Discharge: 2020-01-02 | DRG: 207 | Disposition: A | Discharge status: Home Health | Payer: Medicaid Other | Attending: Internal Medicine | Admitting: Internal Medicine

## 2019-12-20 ENCOUNTER — Other Ambulatory Visit: Payer: Self-pay

## 2019-12-20 ENCOUNTER — Emergency Department: Payer: Medicaid Other

## 2019-12-20 DIAGNOSIS — J9600 Acute respiratory failure, unspecified whether with hypoxia or hypercapnia: Secondary | ICD-10-CM

## 2019-12-20 DIAGNOSIS — J441 Chronic obstructive pulmonary disease with (acute) exacerbation: Principal | ICD-10-CM

## 2019-12-20 DIAGNOSIS — R109 Unspecified abdominal pain: Secondary | ICD-10-CM | POA: Diagnosis not present

## 2019-12-20 DIAGNOSIS — R571 Hypovolemic shock: Secondary | ICD-10-CM | POA: Diagnosis present

## 2019-12-20 DIAGNOSIS — Z66 Do not resuscitate: Secondary | ICD-10-CM | POA: Diagnosis not present

## 2019-12-20 DIAGNOSIS — Z23 Encounter for immunization: Secondary | ICD-10-CM

## 2019-12-20 DIAGNOSIS — J9622 Acute and chronic respiratory failure with hypercapnia: Secondary | ICD-10-CM | POA: Diagnosis present

## 2019-12-20 DIAGNOSIS — I6783 Posterior reversible encephalopathy syndrome: Secondary | ICD-10-CM | POA: Diagnosis not present

## 2019-12-20 DIAGNOSIS — E876 Hypokalemia: Secondary | ICD-10-CM | POA: Diagnosis present

## 2019-12-20 DIAGNOSIS — J9621 Acute and chronic respiratory failure with hypoxia: Secondary | ICD-10-CM | POA: Diagnosis present

## 2019-12-20 DIAGNOSIS — Z91410 Personal history of adult physical and sexual abuse: Secondary | ICD-10-CM

## 2019-12-20 DIAGNOSIS — Z515 Encounter for palliative care: Secondary | ICD-10-CM | POA: Diagnosis not present

## 2019-12-20 DIAGNOSIS — J440 Chronic obstructive pulmonary disease with acute lower respiratory infection: Secondary | ICD-10-CM | POA: Diagnosis present

## 2019-12-20 DIAGNOSIS — Z6841 Body Mass Index (BMI) 40.0 and over, adult: Secondary | ICD-10-CM

## 2019-12-20 DIAGNOSIS — I313 Pericardial effusion (noninflammatory): Secondary | ICD-10-CM | POA: Diagnosis present

## 2019-12-20 DIAGNOSIS — Z79899 Other long term (current) drug therapy: Secondary | ICD-10-CM

## 2019-12-20 DIAGNOSIS — J189 Pneumonia, unspecified organism: Secondary | ICD-10-CM

## 2019-12-20 DIAGNOSIS — I110 Hypertensive heart disease with heart failure: Secondary | ICD-10-CM | POA: Diagnosis present

## 2019-12-20 DIAGNOSIS — Z818 Family history of other mental and behavioral disorders: Secondary | ICD-10-CM

## 2019-12-20 DIAGNOSIS — I509 Heart failure, unspecified: Secondary | ICD-10-CM

## 2019-12-20 DIAGNOSIS — R609 Edema, unspecified: Secondary | ICD-10-CM

## 2019-12-20 DIAGNOSIS — G92 Toxic encephalopathy: Secondary | ICD-10-CM | POA: Diagnosis present

## 2019-12-20 DIAGNOSIS — R739 Hyperglycemia, unspecified: Secondary | ICD-10-CM | POA: Diagnosis present

## 2019-12-20 DIAGNOSIS — F419 Anxiety disorder, unspecified: Secondary | ICD-10-CM | POA: Diagnosis present

## 2019-12-20 DIAGNOSIS — R197 Diarrhea, unspecified: Secondary | ICD-10-CM | POA: Diagnosis not present

## 2019-12-20 DIAGNOSIS — J80 Acute respiratory distress syndrome: Secondary | ICD-10-CM | POA: Diagnosis present

## 2019-12-20 DIAGNOSIS — Z9981 Dependence on supplemental oxygen: Secondary | ICD-10-CM

## 2019-12-20 DIAGNOSIS — Z7951 Long term (current) use of inhaled steroids: Secondary | ICD-10-CM

## 2019-12-20 DIAGNOSIS — E662 Morbid (severe) obesity with alveolar hypoventilation: Secondary | ICD-10-CM | POA: Diagnosis present

## 2019-12-20 DIAGNOSIS — E781 Pure hyperglyceridemia: Secondary | ICD-10-CM | POA: Diagnosis present

## 2019-12-20 DIAGNOSIS — Z8249 Family history of ischemic heart disease and other diseases of the circulatory system: Secondary | ICD-10-CM

## 2019-12-20 DIAGNOSIS — Z20822 Contact with and (suspected) exposure to covid-19: Secondary | ICD-10-CM | POA: Diagnosis present

## 2019-12-20 DIAGNOSIS — Z0189 Encounter for other specified special examinations: Secondary | ICD-10-CM

## 2019-12-20 DIAGNOSIS — E872 Acidosis: Secondary | ICD-10-CM | POA: Diagnosis present

## 2019-12-20 DIAGNOSIS — F17210 Nicotine dependence, cigarettes, uncomplicated: Secondary | ICD-10-CM | POA: Diagnosis present

## 2019-12-20 DIAGNOSIS — E8889 Other specified metabolic disorders: Secondary | ICD-10-CM

## 2019-12-20 DIAGNOSIS — I5043 Acute on chronic combined systolic (congestive) and diastolic (congestive) heart failure: Secondary | ICD-10-CM | POA: Diagnosis present

## 2019-12-20 DIAGNOSIS — E869 Volume depletion, unspecified: Secondary | ICD-10-CM | POA: Diagnosis present

## 2019-12-20 DIAGNOSIS — E7889 Other lipoprotein metabolism disorders: Secondary | ICD-10-CM

## 2019-12-20 LAB — BLOOD GAS, ARTERIAL
Acid-Base Excess: 8.7 mmol/L — ABNORMAL HIGH (ref 0.0–2.0)
Acid-Base Excess: 8.5 mmol/L — ABNORMAL HIGH (ref 0.0–2.0)
Bicarbonate: 42.2 mmol/L — ABNORMAL HIGH (ref 20.0–28.0)
Bicarbonate: 41.2 mmol/L — ABNORMAL HIGH (ref 20.0–28.0)
Delivery systems: POSITIVE
Delivery systems: POSITIVE
Expiratory PAP: 8
Expiratory PAP: 8
FIO2: 100
FIO2: 100
Inspiratory PAP: 18
Inspiratory PAP: 18
O2 Saturation: 92 %
O2 Saturation: 89.2 %
Patient temperature: 37
Patient temperature: 37
pCO2 arterial: 103 mmHg (ref 32.0–48.0)
pCO2 arterial: 94 mmHg (ref 32.0–48.0)
pH, Arterial: 7.22 — ABNORMAL LOW (ref 7.350–7.450)
pH, Arterial: 7.25 — ABNORMAL LOW (ref 7.350–7.450)
pO2, Arterial: 76 mmHg — ABNORMAL LOW (ref 83.0–108.0)
pO2, Arterial: 66 mmHg — ABNORMAL LOW (ref 83.0–108.0)

## 2019-12-20 LAB — CBC WITH DIFFERENTIAL/PLATELET
Abs Immature Granulocytes: 0.09 10*3/uL — ABNORMAL HIGH (ref 0.00–0.07)
Basophils Absolute: 0 10*3/uL (ref 0.0–0.1)
Basophils Relative: 1 %
Eosinophils Absolute: 0 10*3/uL (ref 0.0–0.5)
Eosinophils Relative: 0 %
HCT: 60.9 % — ABNORMAL HIGH (ref 36.0–46.0)
Hemoglobin: 16.7 g/dL — ABNORMAL HIGH (ref 12.0–15.0)
Immature Granulocytes: 1 %
Lymphocytes Relative: 8 %
Lymphs Abs: 0.7 10*3/uL (ref 0.7–4.0)
MCH: 24 pg — ABNORMAL LOW (ref 26.0–34.0)
MCHC: 27.4 g/dL — ABNORMAL LOW (ref 30.0–36.0)
MCV: 87.6 fL (ref 80.0–100.0)
Monocytes Absolute: 0.7 10*3/uL (ref 0.1–1.0)
Monocytes Relative: 8 %
Neutro Abs: 7 10*3/uL (ref 1.7–7.7)
Neutrophils Relative %: 82 %
Platelets: 185 10*3/uL (ref 150–400)
RBC: 6.95 MIL/uL — ABNORMAL HIGH (ref 3.87–5.11)
RDW: 22 % — ABNORMAL HIGH (ref 11.5–15.5)
Smear Review: NORMAL
WBC: 8.5 10*3/uL (ref 4.0–10.5)
nRBC: 0.4 % — ABNORMAL HIGH (ref 0.0–0.2)

## 2019-12-20 LAB — LACTIC ACID, PLASMA: Lactic Acid, Venous: 1.1 mmol/L (ref 0.5–1.9)

## 2019-12-20 LAB — POC SARS CORONAVIRUS 2 AG: SARS Coronavirus 2 Ag: NEGATIVE

## 2019-12-20 LAB — BRAIN NATRIURETIC PEPTIDE: B Natriuretic Peptide: 307.1 pg/mL — ABNORMAL HIGH (ref 0.0–100.0)

## 2019-12-20 MED ORDER — IPRATROPIUM-ALBUTEROL 0.5-2.5 (3) MG/3ML IN SOLN: 3.0000 mL | Freq: Once | RESPIRATORY_TRACT | Status: DC

## 2019-12-20 MED ORDER — METHYLPREDNISOLONE SODIUM SUCC 125 MG IJ SOLR
125.0000 mg | Freq: Once | INTRAMUSCULAR | Status: AC
  Administered 2019-12-20: 125 mg via INTRAVENOUS
  Filled 2019-12-20: qty 2

## 2019-12-20 MED ORDER — MAGNESIUM SULFATE 2 GM/50ML IV SOLN: 2.0000 g | Freq: Once | INTRAVENOUS | Status: DC

## 2019-12-20 MED ORDER — IPRATROPIUM-ALBUTEROL 0.5-2.5 (3) MG/3ML IN SOLN
3.0000 mL | Freq: Once | RESPIRATORY_TRACT | Status: AC
  Administered 2019-12-21: 3 mL via RESPIRATORY_TRACT
  Filled 2019-12-20: qty 3

## 2019-12-20 MED ORDER — IPRATROPIUM-ALBUTEROL 0.5-2.5 (3) MG/3ML IN SOLN
3.0000 mL | Freq: Once | RESPIRATORY_TRACT | Status: AC
  Administered 2019-12-20: 3 mL via RESPIRATORY_TRACT
  Filled 2019-12-20: qty 3

## 2019-12-20 MED ORDER — FUROSEMIDE 10 MG/ML IJ SOLN
40.0000 mg | Freq: Once | INTRAMUSCULAR | Status: AC
  Administered 2019-12-20: 40 mg via INTRAVENOUS
  Filled 2019-12-20: qty 4

## 2019-12-20 NOTE — ED Provider Notes (Signed)
[facility] Emergency Department Provider Note   ____________________________________________   First MD Initiated Contact with Patient 12/20/19 2012     (approximate)  I have reviewed the triage vital signs and the nursing notes.   HISTORY  Chief Complaint Shortness of Breath   HPI [name] is a 54 y.o. female has a history of CHF and COPD.  She is on 4 L of oxygen nasal cannula chronically.  She has been having about 3 days of gradually increasing shortness of breath and fatigue with a cough productive of some phlegm which is clear or white.  She has not been running a fever.  EMS was called and came and found her satting 50 to 58% on 4 L.  They got her up to 90 to 91% on 10 L.  On 12 L she is at 93%.  She cannot tell if this is CHF or COPD.  She also has been exposed to Covid.        Past Medical History:  Diagnosis Date  . Anxiety   . COPD (chronic obstructive pulmonary disease) (HCC)   . Essential hypertension   . History of echocardiogram    a. 08/2013 Echo: EF 55-60%, impaired LV relaxation, mild LVH, nl RV fxn, nl RVSP, mild TR; b. 05/2017 Echo: EF 65-70%, no rwma, nl RV fxn.  . Morbid obesity (HCC)   . Tobacco abuse     Patient Active Problem List   Diagnosis Date Noted  . Chest pain 09/20/2017  . Pericardial effusion   . Acute on chronic respiratory failure with hypoxia (HCC) 07/29/2017  . Acute respiratory failure (HCC) 07/29/2017  . PNA (pneumonia) 06/07/2017    Past Surgical History:  Procedure Laterality Date  . ABDOMINAL HYSTERECTOMY    . CHOLECYSTECTOMY    . EXTUBATION (ENDOTRACHEAL) IN OR N/A 08/14/2017   Procedure: EXTUBATION (ENDOTRACHEAL) IN OR;  Surgeon: Geanie Logan, MD;  Location: [facility] ORS;  Service: ENT;  Laterality: N/A;  . HERNIA REPAIR    . TEE WITHOUT CARDIOVERSION N/A 09/20/2017   Procedure: TRANSESOPHAGEAL ECHOCARDIOGRAM (TEE);  Surgeon: Laurier Nancy, MD;  Location: [facility] ORS;  Service: Cardiovascular;   Laterality: N/A;    Prior to Admission medications   Medication Sig Start Date End Date Taking? Authorizing Provider  albuterol (PROVENTIL HFA;VENTOLIN HFA) 108 (90 Base) MCG/ACT inhaler Inhale 2 puffs into the lungs every 6 (six) hours as needed for wheezing or shortness of breath. 06/12/17   Gouru, Aruna, MD  budesonide (PULMICORT) 0.5 MG/2ML nebulizer solution Take 2 mLs (0.5 mg total) by nebulization 2 (two) times daily. 08/17/17   Katha Hamming, MD  clonazePAM (KLONOPIN) 0.5 MG tablet Take 0.5 mg by mouth 3 (three) times daily as needed for anxiety.     [provider]  cyclobenzaprine (FLEXERIL) 10 MG tablet Take 10 mg by mouth 3 (three) times daily as needed for muscle spasms.  08/16/16   [provider]  fluticasone (FLOVENT HFA) 44 MCG/ACT inhaler Inhale 2 puffs into the lungs daily.    [provider]  furosemide (LASIX) 20 MG tablet Take 1 tablet (20 mg total) by mouth daily. 08/17/17 08/17/18  Katha Hamming, MD  ipratropium-albuterol (DUONEB) 0.5-2.5 (3) MG/3ML SOLN Take 3 mLs by nebulization every 6 (six) hours. Patient taking differently: Take 3 mLs by nebulization 2 (two) times daily.  08/17/17   Katha Hamming, MD  magic mouthwash w/lidocaine SOLN Take 5 mLs by mouth 4 (four) times daily. 11/16/17   Cuthriell,  Delorise Royals, PA-C  omeprazole (PRILOSEC) 20 MG capsule Take 20 mg by mouth daily.    [provider]    Allergies Patient has no known allergies.  Family History  Problem Relation Age of Onset  . Dementia Mother   . Heart attack Father     Social History Social History   Tobacco Use  . Smoking status: Current Every Day Smoker    Packs/day: 0.75    Years: 40.00    Pack years: 30.00    Types: Cigarettes  . Smokeless tobacco: Never Used  Vaping Use  . Vaping Use: Every day  Substance Use Topics  . Alcohol use: No  . Drug use: No    Review of Systems  Constitutional: No fever/chills Eyes: No visual  changes. ENT: No sore throat. Cardiovascular: Denies chest pain. Respiratory: shortness of breath. Gastrointestinal: No abdominal pain.  No nausea, no vomiting.  No diarrhea.  No constipation. Genitourinary: Negative for dysuria. Musculoskeletal: Negative for back pain. Skin: Negative for rash. Neurological: Negative for headaches, focal weakness   ____________________________________________   PHYSICAL EXAM:  VITAL SIGNS: ED Triage Vitals  Enc Vitals Group     BP 12/20/19 2016 135/75     Pulse Rate 12/20/19 2016 100     Resp 12/20/19 2016 (!) 23     Temp 12/20/19 2016 98.2 F (36.8 C)     Temp Source 12/20/19 2016 Oral     SpO2 12/20/19 2016 92 %     Weight 12/20/19 2019 260 lb (117.9 kg)     Height 12/20/19 2019 5' (1.524 m)     Head Circumference --      Peak Flow --      Pain Score 12/20/19 2019 0     Pain Loc --      Pain Edu? --      Excl. in GC? --     Constitutional: Alert and oriented.  Breathing hard but otherwise looking okay Eyes: Conjunctivae are normal. PER Head: Atraumatic. Nose: No congestion/rhinnorhea. Mouth/Throat: Mucous membranes are moist.  Oropharynx non-erythematous. Neck: No stridor. Cardiovascular: Normal rate, regular rhythm. Grossly normal heart sounds.  Good peripheral circulation. Respiratory: Normal respiratory effort.  No retractions. Lungs difficult to auscultate but appear to have some scattered crackles Gastrointestinal: Soft and nontender. No distention. No abdominal bruits. No CVA tenderness. Musculoskeletal: No lower extremity tenderness some edema.   Neurologic:  Normal speech and language. No gross focal neurologic deficits are appreciated.  Skin:  Skin is warm, dry and intact. No rash noted.   ____________________________________________   LABS (all labs ordered are listed, but only abnormal results are displayed)  Labs Reviewed  CBC WITH DIFFERENTIAL/PLATELET - Abnormal; Notable for the following components:      Result  Value   RBC 6.95 (*)    Hemoglobin 16.7 (*)    HCT 60.9 (*)    MCH 24.0 (*)    MCHC 27.4 (*)    RDW 22.0 (*)    nRBC 0.4 (*)    Abs Immature Granulocytes 0.09 (*)    All other components within normal limits  BRAIN NATRIURETIC PEPTIDE - Abnormal; Notable for the following components:   B Natriuretic Peptide 307.1 (*)    All other components within normal limits  BLOOD GAS, ARTERIAL - Abnormal; Notable for the following components:   pH, Arterial 7.22 (*)    pCO2 arterial 103 (*)    pO2, Arterial 76 (*)    Bicarbonate 42.2 (*)    Acid-Base  Excess 8.7 (*)    All other components within normal limits  LACTIC ACID, PLASMA  LACTIC ACID, PLASMA  COMPREHENSIVE METABOLIC PANEL  BLOOD GAS, ARTERIAL  POC SARS CORONAVIRUS 2 AG -  ED  POC SARS CORONAVIRUS 2 AG  TROPONIN I (HIGH SENSITIVITY)  TROPONIN I (HIGH SENSITIVITY)   ____________________________________________  EKG   ____________________________________________  RADIOLOGY  ED MD interpretation: Chest x-ray reviewed by me appears to be consistent with congestive failure.  Official radiology report(s): DG Chest Portable 1 View  Result Date: 12/20/2019 CLINICAL DATA:  Shortness of breath, COVID exposure EXAM: PORTABLE CHEST 1 VIEW COMPARISON:  July 06, 2018 FINDINGS: There is mild cardiomegaly. Fluffy hazy airspace opacities are seen throughout both lungs. Small bilateral pleural effusions are present. No acute osseous abnormality. IMPRESSION: Bilateral airspace opacities which could be due to pulmonary edema and/or infectious etiology. Small bilateral pleural effusion. Electronically Signed   By: Jonna Clark M.D.   On: 12/20/2019 21:01    ____________________________________________   PROCEDURES  Procedure(s) performed (including Critical Care): Critical care time 45 minutes that includes reevaluating the patient several times reviewing her old records speaking with her and her family.  I will also discuss her with the  hospitalist in a few minutes.  Procedures   ____________________________________________   INITIAL IMPRESSION / ASSESSMENT AND PLAN / ED COURSE  ----------------------------------------- 10:41 PM on 12/20/2019 -----------------------------------------  Patient has woken up slightly since BiPAP was applied.  Blood gas done when BiPAP was applied showed an elevated PCO2 which leads me to think that she has a large component of COPD going on.  I have continue to encourage her to take deep breaths and have encouraged her family member who is with her to do the same.  We will give her another DuoNeb and repeat the blood gas to see if she is progressing.  She does not want to be intubated.  I discussed that with her already.  He also has a component of CHF as her BNP is elevated chest x-ray looks  CHFy.  I have addressed this by giving her Lasix.  Her coronavirus test so far is negative.             ____________________________________________   FINAL CLINICAL IMPRESSION(S) / ED DIAGNOSES  Final diagnoses:  COPD exacerbation (HCC)  Congestive heart failure, unspecified HF chronicity, unspecified heart failure type [facility])     ED Discharge Orders    None       Note:  This document was prepared using Dragon voice recognition software and may include unintentional dictation errors.    Arnaldo Natal, MD 12/20/19 2303

## 2019-12-20 NOTE — ED Provider Notes (Signed)
Assumed care from Dr. Darnelle Catalan at 11 PM. Briefly, the patient is a 54 y.o. female with PMHx of  has a past medical history of Anxiety, COPD (chronic obstructive pulmonary disease) (HCC), Essential hypertension, History of echocardiogram, Morbid obesity (HCC), and Tobacco abuse. here with resp distress. Pt arrives with BIPAP, acute on chronic resp acidosis likely 2/2 combination of COPD and pulm edema. Pt very resistant to intubation given poor experience in 2019, but would be OK with it if absolutely necessary. Pt given nebs, steroids, lasix. Admitted to ICU.   Labs Reviewed  CBC WITH DIFFERENTIAL/PLATELET - Abnormal; Notable for the following components:      Result Value   RBC 6.95 (*)    Hemoglobin 16.7 (*)    HCT 60.9 (*)    MCH 24.0 (*)    MCHC 27.4 (*)    RDW 22.0 (*)    nRBC 0.4 (*)    Abs Immature Granulocytes 0.09 (*)    All other components within normal limits  BRAIN NATRIURETIC PEPTIDE - Abnormal; Notable for the following components:   B Natriuretic Peptide 307.1 (*)    All other components within normal limits  BLOOD GAS, ARTERIAL - Abnormal; Notable for the following components:   pH, Arterial 7.22 (*)    pCO2 arterial 103 (*)    pO2, Arterial 76 (*)    Bicarbonate 42.2 (*)    Acid-Base Excess 8.7 (*)    All other components within normal limits  LACTIC ACID, PLASMA  LACTIC ACID, PLASMA  BLOOD GAS, ARTERIAL  POC SARS CORONAVIRUS 2 AG -  ED  POC SARS CORONAVIRUS 2 AG  TROPONIN I (HIGH SENSITIVITY)    Course of Care: -On my reassessment, pt is in moderate resp distress, remains drowsy but will awake to answer questions. Repeat blood gas with minimal to slight improvement. Had a long discussion with pt and significant other re: intubation. Pt would prefer not to be intubated, as she had a very bad experience with this in the past (2019). She states she would want a tracheostomy only. I explained that we cannot place a trach without first intubating her, which she now  understands. -pt increasingly drowsy and altered, with deats to low 80s. Intubated as above, tolerated well. Will admit to ICU. COVID reordered given her CXR findings c/f COVID. Empiric ABX given for COPD exacerbation now requiring ICU admission.  .Critical Care Performed by: Shaune Pollack, MD Authorized by: Shaune Pollack, MD   Critical care provider statement:    Critical care time (minutes):  35   Critical care time was exclusive of:  Separately billable procedures and treating other patients and teaching time   Critical care was necessary to treat or prevent imminent or life-threatening deterioration of the following conditions:  Circulatory failure, cardiac failure and respiratory failure   Critical care was time spent personally by me on the following activities:  Development of treatment plan with patient or surrogate, discussions with consultants, evaluation of patient's response to treatment, examination of patient, obtaining history from patient or surrogate, ordering and performing treatments and interventions, ordering and review of laboratory studies, ordering and review of radiographic studies, pulse oximetry, re-evaluation of patient's condition and review of old charts   I assumed direction of critical care for this patient from another provider in my specialty: no   Procedure Name: Intubation Date/Time: 12/21/2019 4:36 AM Performed by: Shaune Pollack, MD Pre-anesthesia Checklist: Patient identified, Patient being monitored, Emergency Drugs available, Timeout performed and Suction available Oxygen Delivery  Method: Non-rebreather mask Preoxygenation: Pre-oxygenation with 100% oxygen Induction Type: Rapid sequence Ventilation: Mask ventilation without difficulty Laryngoscope Size: Glidescope and 3 Grade View: Grade I Tube size: 7.0 mm Number of attempts: 1 Airway Equipment and Method: Rigid stylet and Video-laryngoscopy Placement Confirmation: ETT inserted through vocal  cords under direct vision,  CO2 detector and Breath sounds checked- equal and bilateral Tube secured with: ETT holder Dental Injury: Teeth and Oropharynx as per pre-operative assessment  Difficulty Due To: Difficulty was anticipated, Difficult Airway- due to large tongue, Difficult Airway- due to reduced neck mobility and Difficult Airway- due to limited oral opening Future Recommendations: Recommend- induction with short-acting agent, and alternative techniques readily available    ARTERIAL BLOOD GAS  Date/Time: 12/21/2019 4:37 AM Performed by: Shaune Pollack, MD Authorized by: Shaune Pollack, MD   Consent:    Consent obtained:  Verbal   Consent given by:  Patient   Risks discussed:  AV fistula, bleeding, impaired circulation, infection, nerve damage and pain Procedure details:    Location:  R radial   Allen's test performed: yes     Allen's test abnormal: no     Number of attempts:  1 Post-procedure details:    Dressing applied: yes     Bleeding:  Hemostasis achieved   Circulation, movement, and sensation:  Normal   Patient tolerance of procedure:  Tolerated well, no immediate complications        Shaune Pollack, MD 12/21/19 [phone]

## 2019-12-20 NOTE — ED Notes (Signed)
Attempted to call RT without answer.

## 2019-12-20 NOTE — ED Notes (Signed)
Patient cleansed and sheets changed.  Purewick external cath in place for patient comfort.

## 2019-12-20 NOTE — ED Notes (Signed)
Attempted IV x2 without success  

## 2019-12-20 NOTE — ED Notes (Signed)
Lab draw requested

## 2019-12-20 NOTE — ED Notes (Signed)
RT paged.

## 2019-12-20 NOTE — ED Notes (Signed)
Lab at bedside for redraw

## 2019-12-20 NOTE — ED Triage Notes (Signed)
Pt c/o increased sob and fatigue for 3 days, pt also reports daughter tested covid positive 3 days ago. Pt has hx of CHF and COPD and is on 4 liter's [place] chronically. Per ems, pt was saturating 50-58% on 4 liters [place]. Pt reports baseline oxygen level is 86-90% pt is AOX4, nad noted. Pt on 10 liters non-rebreather upon arrival.

## 2019-12-21 ENCOUNTER — Inpatient Hospital Stay: Payer: Medicaid Other

## 2019-12-21 ENCOUNTER — Inpatient Hospital Stay (HOSPITAL_COMMUNITY)
Admit: 2019-12-21 | Discharge: 2019-12-21 | Disposition: A | Discharge status: Home / Self Care | Payer: Medicaid Other | Attending: Adult Health | Admitting: Adult Health

## 2019-12-21 ENCOUNTER — Encounter: Payer: Self-pay | Admitting: Pulmonary Disease

## 2019-12-21 DIAGNOSIS — Z66 Do not resuscitate: Secondary | ICD-10-CM | POA: Diagnosis not present

## 2019-12-21 DIAGNOSIS — J9621 Acute and chronic respiratory failure with hypoxia: Secondary | ICD-10-CM | POA: Diagnosis not present

## 2019-12-21 DIAGNOSIS — R571 Hypovolemic shock: Secondary | ICD-10-CM | POA: Diagnosis present

## 2019-12-21 DIAGNOSIS — J9622 Acute and chronic respiratory failure with hypercapnia: Secondary | ICD-10-CM | POA: Diagnosis not present

## 2019-12-21 DIAGNOSIS — J440 Chronic obstructive pulmonary disease with acute lower respiratory infection: Secondary | ICD-10-CM | POA: Diagnosis present

## 2019-12-21 DIAGNOSIS — E872 Acidosis: Secondary | ICD-10-CM | POA: Diagnosis present

## 2019-12-21 DIAGNOSIS — Z23 Encounter for immunization: Secondary | ICD-10-CM | POA: Diagnosis not present

## 2019-12-21 DIAGNOSIS — J80 Acute respiratory distress syndrome: Secondary | ICD-10-CM | POA: Diagnosis present

## 2019-12-21 DIAGNOSIS — Z20822 Contact with and (suspected) exposure to covid-19: Secondary | ICD-10-CM | POA: Diagnosis present

## 2019-12-21 DIAGNOSIS — Z7951 Long term (current) use of inhaled steroids: Secondary | ICD-10-CM | POA: Diagnosis not present

## 2019-12-21 DIAGNOSIS — Z515 Encounter for palliative care: Secondary | ICD-10-CM | POA: Diagnosis not present

## 2019-12-21 DIAGNOSIS — J189 Pneumonia, unspecified organism: Secondary | ICD-10-CM | POA: Diagnosis present

## 2019-12-21 DIAGNOSIS — Z9981 Dependence on supplemental oxygen: Secondary | ICD-10-CM | POA: Diagnosis not present

## 2019-12-21 DIAGNOSIS — J1282 Pneumonia due to coronavirus disease 2019: Secondary | ICD-10-CM | POA: Diagnosis not present

## 2019-12-21 DIAGNOSIS — I6783 Posterior reversible encephalopathy syndrome: Secondary | ICD-10-CM | POA: Diagnosis not present

## 2019-12-21 DIAGNOSIS — Z6841 Body Mass Index (BMI) 40.0 and over, adult: Secondary | ICD-10-CM | POA: Diagnosis not present

## 2019-12-21 DIAGNOSIS — J9601 Acute respiratory failure with hypoxia: Secondary | ICD-10-CM

## 2019-12-21 DIAGNOSIS — J9600 Acute respiratory failure, unspecified whether with hypoxia or hypercapnia: Secondary | ICD-10-CM | POA: Diagnosis present

## 2019-12-21 DIAGNOSIS — F17210 Nicotine dependence, cigarettes, uncomplicated: Secondary | ICD-10-CM | POA: Diagnosis present

## 2019-12-21 DIAGNOSIS — I5043 Acute on chronic combined systolic (congestive) and diastolic (congestive) heart failure: Secondary | ICD-10-CM | POA: Diagnosis present

## 2019-12-21 DIAGNOSIS — J9602 Acute respiratory failure with hypercapnia: Secondary | ICD-10-CM | POA: Diagnosis not present

## 2019-12-21 DIAGNOSIS — I313 Pericardial effusion (noninflammatory): Secondary | ICD-10-CM | POA: Diagnosis present

## 2019-12-21 DIAGNOSIS — R609 Edema, unspecified: Secondary | ICD-10-CM | POA: Insufficient documentation

## 2019-12-21 DIAGNOSIS — Z818 Family history of other mental and behavioral disorders: Secondary | ICD-10-CM | POA: Diagnosis not present

## 2019-12-21 DIAGNOSIS — J441 Chronic obstructive pulmonary disease with (acute) exacerbation: Secondary | ICD-10-CM | POA: Diagnosis present

## 2019-12-21 DIAGNOSIS — E662 Morbid (severe) obesity with alveolar hypoventilation: Secondary | ICD-10-CM | POA: Diagnosis present

## 2019-12-21 DIAGNOSIS — Z79899 Other long term (current) drug therapy: Secondary | ICD-10-CM | POA: Diagnosis not present

## 2019-12-21 DIAGNOSIS — Z8249 Family history of ischemic heart disease and other diseases of the circulatory system: Secondary | ICD-10-CM | POA: Diagnosis not present

## 2019-12-21 DIAGNOSIS — U071 COVID-19: Secondary | ICD-10-CM | POA: Diagnosis not present

## 2019-12-21 DIAGNOSIS — R569 Unspecified convulsions: Secondary | ICD-10-CM | POA: Diagnosis not present

## 2019-12-21 DIAGNOSIS — G92 Toxic encephalopathy: Secondary | ICD-10-CM | POA: Diagnosis present

## 2019-12-21 DIAGNOSIS — E876 Hypokalemia: Secondary | ICD-10-CM | POA: Diagnosis present

## 2019-12-21 DIAGNOSIS — F419 Anxiety disorder, unspecified: Secondary | ICD-10-CM | POA: Diagnosis present

## 2019-12-21 LAB — BLOOD GAS, ARTERIAL
Acid-Base Excess: 7.7 mmol/L — ABNORMAL HIGH (ref 0.0–2.0)
Acid-Base Excess: 6.3 mmol/L — ABNORMAL HIGH (ref 0.0–2.0)
Bicarbonate: 39.9 mmol/L — ABNORMAL HIGH (ref 20.0–28.0)
Bicarbonate: 34.5 mmol/L — ABNORMAL HIGH (ref 20.0–28.0)
FIO2: 100
MECHVT: 500 mL
O2 Saturation: 97.3 %
O2 Saturation: 99.4 %
PEEP: 10 cmH2O
Patient temperature: 37
Patient temperature: 37
RATE: 16 resp/min
pCO2 arterial: 89 mmHg (ref 32.0–48.0)
pCO2 arterial: 61 mmHg — ABNORMAL HIGH (ref 32.0–48.0)
pH, Arterial: 7.26 — ABNORMAL LOW (ref 7.350–7.450)
pH, Arterial: 7.36 (ref 7.350–7.450)
pO2, Arterial: 107 mmHg (ref 83.0–108.0)
pO2, Arterial: 158 mmHg — ABNORMAL HIGH (ref 83.0–108.0)

## 2019-12-21 LAB — BASIC METABOLIC PANEL
Anion gap: 17 — ABNORMAL HIGH (ref 5–15)
BUN: 21 mg/dL — ABNORMAL HIGH (ref 6–20)
CO2: 28 mmol/L (ref 22–32)
Calcium: 8.8 mg/dL — ABNORMAL LOW (ref 8.9–10.3)
Chloride: 97 mmol/L — ABNORMAL LOW (ref 98–111)
Creatinine, Ser: 0.95 mg/dL (ref 0.44–1.00)
GFR calc Af Amer: >60 mL/min (ref >60)
GFR calc non Af Amer: >60 mL/min (ref >60)
Glucose, Bld: 182 mg/dL — ABNORMAL HIGH (ref 70–99)
Potassium: 4.3 mmol/L (ref 3.5–5.1)
Sodium: 142 mmol/L (ref 135–145)

## 2019-12-21 LAB — CBC
HCT: 58.6 % — ABNORMAL HIGH (ref 36.0–46.0)
Hemoglobin: 16.7 g/dL — ABNORMAL HIGH (ref 12.0–15.0)
MCH: 24 pg — ABNORMAL LOW (ref 26.0–34.0)
MCHC: 28.5 g/dL — ABNORMAL LOW (ref 30.0–36.0)
MCV: 84.3 fL (ref 80.0–100.0)
Platelets: 218 10*3/uL (ref 150–400)
RBC: 6.95 MIL/uL — ABNORMAL HIGH (ref 3.87–5.11)
RDW: 21.8 % — ABNORMAL HIGH (ref 11.5–15.5)
WBC: 8 10*3/uL (ref 4.0–10.5)
nRBC: 0 % (ref 0.0–0.2)

## 2019-12-21 LAB — GLUCOSE, CAPILLARY
Glucose-Capillary: 162 mg/dL — ABNORMAL HIGH (ref 70–99)
Glucose-Capillary: 135 mg/dL — ABNORMAL HIGH (ref 70–99)

## 2019-12-21 LAB — COMPREHENSIVE METABOLIC PANEL
ALT: 11 U/L (ref 0–44)
AST: 14 U/L — ABNORMAL LOW (ref 15–41)
Albumin: 3.6 g/dL (ref 3.5–5.0)
Alkaline Phosphatase: 77 U/L (ref 38–126)
Anion gap: 12 (ref 5–15)
BUN: 18 mg/dL (ref 6–20)
CO2: 36 mmol/L — ABNORMAL HIGH (ref 22–32)
Calcium: 9 mg/dL (ref 8.9–10.3)
Chloride: 94 mmol/L — ABNORMAL LOW (ref 98–111)
Creatinine, Ser: 0.91 mg/dL (ref 0.44–1.00)
GFR calc Af Amer: >60 mL/min (ref >60)
GFR calc non Af Amer: >60 mL/min (ref >60)
Glucose, Bld: 114 mg/dL — ABNORMAL HIGH (ref 70–99)
Potassium: 5.2 mmol/L — ABNORMAL HIGH (ref 3.5–5.1)
Sodium: 142 mmol/L (ref 135–145)
Total Bilirubin: 2 mg/dL — ABNORMAL HIGH (ref 0.3–1.2)
Total Protein: 7.4 g/dL (ref 6.5–8.1)

## 2019-12-21 LAB — ECHOCARDIOGRAM COMPLETE
Height: 60 in
S' Lateral: 2.47 cm
Weight: 4765.46 oz

## 2019-12-21 LAB — PHOSPHORUS: Phosphorus: 2.7 mg/dL (ref 2.5–4.6)

## 2019-12-21 LAB — PROCALCITONIN: Procalcitonin: <0.1 ng/mL

## 2019-12-21 LAB — MAGNESIUM: Magnesium: 1.8 mg/dL (ref 1.7–2.4)

## 2019-12-21 LAB — TRIGLYCERIDES: Triglycerides: 125 mg/dL (ref <150)

## 2019-12-21 LAB — HEMOGLOBIN A1C
Hgb A1c MFr Bld: 6.9 % — ABNORMAL HIGH (ref 4.8–5.6)
Mean Plasma Glucose: 151.33 mg/dL

## 2019-12-21 LAB — TROPONIN I (HIGH SENSITIVITY)
Troponin I (High Sensitivity): 4 ng/L (ref <18)
Troponin I (High Sensitivity): 6 ng/L (ref <18)

## 2019-12-21 LAB — HIV ANTIBODY (ROUTINE TESTING W REFLEX): HIV Screen 4th Generation wRfx: NONREACTIVE

## 2019-12-21 LAB — MRSA PCR SCREENING: MRSA by PCR: NEGATIVE

## 2019-12-21 LAB — SARS CORONAVIRUS 2 BY RT PCR (HOSPITAL ORDER, PERFORMED IN ~~LOC~~ HOSPITAL LAB): SARS Coronavirus 2: NEGATIVE

## 2019-12-21 LAB — LACTIC ACID, PLASMA: Lactic Acid, Venous: 1.4 mmol/L (ref 0.5–1.9)

## 2019-12-21 MED ORDER — DOCUSATE SODIUM 50 MG/5ML PO LIQD
100.0000 mg | Freq: Two times a day (BID) | ORAL | Status: DC
  Administered 2019-12-22 – 2019-12-27 (×12): 100 mg
  Filled 2019-12-21 (×12): qty 10

## 2019-12-21 MED ORDER — DOCUSATE SODIUM 100 MG PO CAPS: 100.0000 mg | ORAL_CAPSULE | Freq: Two times a day (BID) | ORAL | Status: DC | PRN

## 2019-12-21 MED ORDER — DOCUSATE SODIUM 50 MG/5ML PO LIQD
100.0000 mg | Freq: Two times a day (BID) | ORAL | Status: DC
  Administered 2019-12-21: 100 mg via ORAL
  Filled 2019-12-21 (×3): qty 10

## 2019-12-21 MED ORDER — MIDAZOLAM HCL 2 MG/2ML IJ SOLN
2.0000 mg | Freq: Once | INTRAMUSCULAR | Status: AC
  Administered 2019-12-21: 2 mg via INTRAVENOUS

## 2019-12-21 MED ORDER — PROPOFOL 1000 MG/100ML IV EMUL
0.0000 ug/kg/min | INTRAVENOUS | Status: DC
  Filled 2019-12-21: qty 100

## 2019-12-21 MED ORDER — INSULIN ASPART 100 UNIT/ML ~~LOC~~ SOLN
0.0000 [IU] | Freq: Three times a day (TID) | SUBCUTANEOUS | Status: DC
  Administered 2019-12-22 – 2019-12-23 (×4): 2 [IU] via SUBCUTANEOUS
  Administered 2019-12-23: 1 [IU] via SUBCUTANEOUS
  Filled 2019-12-21 (×4): qty 1

## 2019-12-21 MED ORDER — ALBUTEROL SULFATE (2.5 MG/3ML) 0.083% IN NEBU
5.0000 mg | INHALATION_SOLUTION | Freq: Once | RESPIRATORY_TRACT | Status: AC
  Administered 2019-12-21: 5 mg via RESPIRATORY_TRACT
  Filled 2019-12-21: qty 6

## 2019-12-21 MED ORDER — FENTANYL CITRATE (PF) 100 MCG/2ML IJ SOLN
50.0000 ug | INTRAMUSCULAR | Status: DC | PRN
  Filled 2019-12-21: qty 2

## 2019-12-21 MED ORDER — PROPOFOL 1000 MG/100ML IV EMUL
0.0000 ug/kg/min | INTRAVENOUS | Status: DC
  Administered 2019-12-21: 40 ug/kg/min via INTRAVENOUS
  Administered 2019-12-21: 28.697 ug/kg/min via INTRAVENOUS
  Administered 2019-12-21: 17.246 ug/kg/min via INTRAVENOUS
  Administered 2019-12-21: 40 ug/kg/min via INTRAVENOUS
  Administered 2019-12-22 (×2): 45 ug/kg/min via INTRAVENOUS
  Administered 2019-12-22: 30 ug/kg/min via INTRAVENOUS
  Administered 2019-12-22: 50 ug/kg/min via INTRAVENOUS
  Administered 2019-12-22: 40 ug/kg/min via INTRAVENOUS
  Administered 2019-12-22: 30 ug/kg/min via INTRAVENOUS
  Administered 2019-12-23: 35 ug/kg/min via INTRAVENOUS
  Administered 2019-12-23: 50 ug/kg/min via INTRAVENOUS
  Administered 2019-12-23: 30 ug/kg/min via INTRAVENOUS
  Administered 2019-12-23: 50 ug/kg/min via INTRAVENOUS
  Administered 2019-12-23: 30 ug/kg/min via INTRAVENOUS
  Administered 2019-12-24: 35 ug/kg/min via INTRAVENOUS
  Administered 2019-12-24 (×2): 30 ug/kg/min via INTRAVENOUS
  Administered 2019-12-24: 20 ug/kg/min via INTRAVENOUS
  Administered 2019-12-24: 35 ug/kg/min via INTRAVENOUS
  Administered 2019-12-25: 15 ug/kg/min via INTRAVENOUS
  Administered 2019-12-25: 20 ug/kg/min via INTRAVENOUS
  Filled 2019-12-21 (×9): qty 100
  Filled 2019-12-21: qty 200
  Filled 2019-12-21 (×13): qty 100

## 2019-12-21 MED ORDER — SODIUM CHLORIDE 0.9 % IV SOLN
500.0000 mg | Freq: Once | INTRAVENOUS | Status: AC
  Administered 2019-12-21: 500 mg via INTRAVENOUS
  Filled 2019-12-21: qty 500

## 2019-12-21 MED ORDER — METHYLPREDNISOLONE SODIUM SUCC 40 MG IJ SOLR
40.0000 mg | Freq: Two times a day (BID) | INTRAMUSCULAR | Status: DC
  Administered 2019-12-21 – 2019-12-23 (×5): 40 mg via INTRAVENOUS
  Filled 2019-12-21 (×5): qty 1

## 2019-12-21 MED ORDER — FENTANYL CITRATE (PF) 100 MCG/2ML IJ SOLN
50.0000 ug | INTRAMUSCULAR | Status: DC | PRN
  Administered 2019-12-21 (×2): 100 ug via INTRAVENOUS
  Administered 2019-12-21: 50 ug via INTRAVENOUS
  Administered 2019-12-22: 150 ug via INTRAVENOUS
  Administered 2019-12-22 – 2019-12-27 (×6): 100 ug via INTRAVENOUS
  Filled 2019-12-21: qty 4
  Filled 2019-12-21 (×3): qty 2

## 2019-12-21 MED ORDER — HEPARIN SODIUM (PORCINE) 5000 UNIT/ML IJ SOLN
5000.0000 [IU] | Freq: Three times a day (TID) | INTRAMUSCULAR | Status: DC
  Administered 2019-12-21 – 2019-12-30 (×25): 5000 [IU] via SUBCUTANEOUS
  Filled 2019-12-21 (×26): qty 1

## 2019-12-21 MED ORDER — FENTANYL CITRATE (PF) 100 MCG/2ML IJ SOLN: 100.0000 ug | INTRAMUSCULAR | Status: DC | PRN

## 2019-12-21 MED ORDER — SODIUM CHLORIDE 0.9 % IV SOLN
500.0000 mg | INTRAVENOUS | Status: DC
  Administered 2019-12-22 – 2019-12-25 (×4): 500 mg via INTRAVENOUS
  Filled 2019-12-21 (×5): qty 500

## 2019-12-21 MED ORDER — ETOMIDATE 2 MG/ML IV SOLN
30.0000 mg | Freq: Once | INTRAVENOUS | Status: AC
  Administered 2019-12-21: 30 mg via INTRAVENOUS

## 2019-12-21 MED ORDER — LACTATED RINGERS IV SOLN: INTRAVENOUS | Status: AC

## 2019-12-21 MED ORDER — CHLORHEXIDINE GLUCONATE 0.12% ORAL RINSE (MEDLINE KIT)
15.0000 mL | Freq: Two times a day (BID) | OROMUCOSAL | Status: DC
  Administered 2019-12-21 – 2019-12-30 (×19): 15 mL via OROMUCOSAL

## 2019-12-21 MED ORDER — BUDESONIDE 0.5 MG/2ML IN SUSP
0.5000 mg | Freq: Two times a day (BID) | RESPIRATORY_TRACT | Status: DC
  Administered 2019-12-21 – 2019-12-30 (×19): 0.5 mg via RESPIRATORY_TRACT
  Filled 2019-12-21 (×20): qty 2

## 2019-12-21 MED ORDER — SODIUM CHLORIDE 0.9 % IV SOLN
2.0000 g | Freq: Once | INTRAVENOUS | Status: AC
  Administered 2019-12-21: 2 g via INTRAVENOUS
  Filled 2019-12-21: qty 20

## 2019-12-21 MED ORDER — POLYETHYLENE GLYCOL 3350 17 G PO PACK: 17.0000 g | PACK | Freq: Every day | ORAL | Status: DC | PRN

## 2019-12-21 MED ORDER — IPRATROPIUM-ALBUTEROL 0.5-2.5 (3) MG/3ML IN SOLN
3.0000 mL | Freq: Four times a day (QID) | RESPIRATORY_TRACT | Status: DC
  Administered 2019-12-21 – 2019-12-31 (×42): 3 mL via RESPIRATORY_TRACT
  Filled 2019-12-21 (×44): qty 3

## 2019-12-21 MED ORDER — ROCURONIUM BROMIDE 50 MG/5ML IV SOLN
120.0000 mg | Freq: Once | INTRAVENOUS | Status: AC
  Administered 2019-12-21: 120 mg via INTRAVENOUS
  Filled 2019-12-21: qty 12

## 2019-12-21 MED ORDER — FAMOTIDINE IN NACL 20-0.9 MG/50ML-% IV SOLN
20.0000 mg | Freq: Two times a day (BID) | INTRAVENOUS | Status: DC
  Administered 2019-12-21 – 2019-12-30 (×15): 20 mg via INTRAVENOUS
  Filled 2019-12-21 (×15): qty 50

## 2019-12-21 MED ORDER — CHLORHEXIDINE GLUCONATE CLOTH 2 % EX PADS
6.0000 | MEDICATED_PAD | Freq: Every day | CUTANEOUS | Status: DC
  Administered 2019-12-21 – 2019-12-29 (×9): 6 via TOPICAL

## 2019-12-21 MED ORDER — MAGNESIUM SULFATE 2 GM/50ML IV SOLN
2.0000 g | Freq: Once | INTRAVENOUS | Status: AC
  Administered 2019-12-21: 2 g via INTRAVENOUS
  Filled 2019-12-21: qty 50

## 2019-12-21 MED ORDER — SODIUM CHLORIDE 0.9 % IV SOLN
1.0000 g | INTRAVENOUS | Status: AC
  Administered 2019-12-22 – 2019-12-27 (×6): 1 g via INTRAVENOUS
  Filled 2019-12-21: qty 10
  Filled 2019-12-21 (×4): qty 1
  Filled 2019-12-21 (×2): qty 10

## 2019-12-21 MED ORDER — ORAL CARE MOUTH RINSE
15.0000 mL | OROMUCOSAL | Status: DC
  Administered 2019-12-21 – 2019-12-30 (×87): 15 mL via OROMUCOSAL

## 2019-12-21 MED ORDER — POLYETHYLENE GLYCOL 3350 17 G PO PACK
17.0000 g | PACK | Freq: Every day | ORAL | Status: DC
  Administered 2019-12-22 – 2019-12-27 (×7): 17 g via ORAL
  Filled 2019-12-21 (×6): qty 1

## 2019-12-21 NOTE — ED Notes (Signed)
Called lab to draw 5 am labs

## 2019-12-21 NOTE — Consult Note (Signed)
Pharmacy Antibiotic Note  [name] is a 54 y.o. female admitted on 12/20/2019 with pneumonia.  Pharmacy has been consulted for azithromycin and ceftriaxone dosing.  Plan: 1) start ceftriaxone 1 gram IV every 24 hours  2) start azithromycin 500 mg IV every 24 hours  Height: 5' (152.4 cm) Weight: 135.1 kg (297 lb 13.5 oz) IBW/kg (Calculated) : 45.5  Temp (24hrs), Avg:98.1 F (36.7 C), Min:97.1 F (36.2 C), Max:98.6 F (37 C)  Recent Labs  Lab 12/20/19 2154 12/20/19 2220 12/20/19 2350  WBC 8.5  --   --   CREATININE  --   --  0.91  LATICACIDVEN 1.1 1.4  --     Estimated Creatinine Clearance: 88.6 mL/min (by C-G formula based on SCr of 0.91 mg/dL).    No Known Allergies  Antimicrobials this admission: azithromycin 8/8 >>  ceftriaxone 8/8 >>   Microbiology results: 8/8 BCx: pending 8/8 SARS CoV-2: negative   Thank you for allowing pharmacy to be a part of this patient's care.  [name] 12/21/2019 6:55 AM

## 2019-12-21 NOTE — H&P (Signed)
PULMONARY / CRITICAL CARE MEDICINE  Name: [name] MRN: [id number] DOB: August 23, 1965    LOS: 0  Admitting Provider:  Dr. Karna Christmas Reason for Admission:  Acute respiratory failure  Brief patient description: 54 year old female with a history of COPD on home O2 at 4 L nasal cannula presented with progressive shortness of breath and fatigue x3 days, found to be in acute hypercarbic and hypoxic respiratory failure; failed BiPAP and had to be emergently intubated  HPI: This is a 54 year old female with a medical history as indicated below, on home O2, previous tracheostomy with prolonged vent support and failure to wean, who presented to the ED via EMS with complaints of shortness of breath that had progressed for over 3 days.  History is obtained from ED records and from her significant other who is at bedside.  Patient was exposed to her daughter who tested positive for Covid 3 days ago and then started having symptoms.  When EMS arrived, patient's SPO2 was between 50 and 58% on 4 L nasal cannula.  She was placed on 10 L and transferred to the ED.  Her ED work-up was significant for diffuse lung opacities on chest x-ray severe hypercarbia, acidosis and hypoxemia on ABG, and mild hypokalemia.  She was placed on BiPAP and subsequently failed requiring emergent intubation.  Of note, in 2019, patient had a prolonged hospital stay and vent support with subsequent tracheostomy.  Per her daughter who is her POA, patient would prefer to be trached sooner than later should she require ventilator support for more than 1 week this time.  SIGNIFICANT EVENTS: 12/21/2019: Admitted to the ICU with acute hypoxic and hypercarbic respiratory failure on the ventilator   Past Medical History:  Diagnosis Date  . Anxiety   . COPD (chronic obstructive pulmonary disease) (HCC)   . Essential hypertension   . History of echocardiogram    a. 08/2013 Echo: EF 55-60%, impaired LV relaxation, mild LVH, nl RV fxn, nl  RVSP, mild TR; b. 05/2017 Echo: EF 65-70%, no rwma, nl RV fxn.  . Morbid obesity (HCC)   . Tobacco abuse    Past Surgical History:  Procedure Laterality Date  . ABDOMINAL HYSTERECTOMY    . CHOLECYSTECTOMY    . EXTUBATION (ENDOTRACHEAL) IN OR N/A 08/14/2017   Procedure: EXTUBATION (ENDOTRACHEAL) IN OR;  Surgeon: Geanie Logan, MD;  Location: [facility] ORS;  Service: ENT;  Laterality: N/A;  . HERNIA REPAIR    . TEE WITHOUT CARDIOVERSION N/A 09/20/2017   Procedure: TRANSESOPHAGEAL ECHOCARDIOGRAM (TEE);  Surgeon: Laurier Nancy, MD;  Location: [facility] ORS;  Service: Cardiovascular;  Laterality: N/A;   No current facility-administered medications on file prior to encounter.   Current Outpatient Medications on File Prior to Encounter  Medication Sig  . albuterol (PROVENTIL HFA;VENTOLIN HFA) 108 (90 Base) MCG/ACT inhaler Inhale 2 puffs into the lungs every 6 (six) hours as needed for wheezing or shortness of breath.  Marland Kitchen ipratropium-albuterol (DUONEB) 0.5-2.5 (3) MG/3ML SOLN Inhale 3 mLs into the lungs every 8 (eight) hours as needed.  Marland Kitchen SPIRIVA HANDIHALER 18 MCG inhalation capsule Place 1 capsule into inhaler and inhale daily.  Marland Kitchen ADVAIR HFA 115-21 MCG/ACT inhaler Inhale 2 puffs into the lungs 2 (two) times daily.  . clonazePAM (KLONOPIN) 0.5 MG tablet Take 0.5 mg by mouth 3 (three) times daily as needed for anxiety.   . cyclobenzaprine (FLEXERIL) 10 MG tablet Take 10 mg by mouth 3 (three) times daily as needed for muscle spasms.   . fluticasone (FLOVENT  HFA) 44 MCG/ACT inhaler Inhale 2 puffs into the lungs daily.  . furosemide (LASIX) 40 MG tablet Take 40 mg by mouth 2 (two) times daily.  Marland Kitchen ipratropium-albuterol (DUONEB) 0.5-2.5 (3) MG/3ML SOLN Take 3 mLs by nebulization every 6 (six) hours. (Patient taking differently: Take 3 mLs by nebulization 2 (two) times daily. )  . magic mouthwash w/lidocaine SOLN Take 5 mLs by mouth 4 (four) times daily.  Marland Kitchen omeprazole (PRILOSEC) 20 MG capsule Take 20 mg by mouth  daily.    Allergies No Known Allergies  Family History Family History  Problem Relation Age of Onset  . Dementia Mother   . Heart attack Father    Social History  reports that she has been smoking cigarettes. She has a 30.00 pack-year smoking history. She has never used smokeless tobacco. She reports that she does not drink alcohol and does not use drugs.  Review Of Systems: Unable to obtain as patient is currently intubated and sedated  VITAL SIGNS: BP 137/83   Pulse 90   Temp 98.2 F (36.8 C) (Oral)   Resp 19   Ht 5' (1.524 m)   Wt 117.9 kg   SpO2 94%   BMI 50.78 kg/m   HEMODYNAMICS:    VENTILATOR SETTINGS:    INTAKE / OUTPUT: No intake/output data recorded.  PHYSICAL EXAMINATION: General: Obese, comfortable on the vent HEENT: Normocephalic and atraumatic, PERRLA, trachea midline, copious oral secretions Neuro: Withdraws to noxious stimulus, positive gag reflex, moves all extremities Cardiovascular: Apical pulse regular, S1-S2, no murmur regurg or gallop, +2 pulses bilaterally Extremities: Bilateral lower extremities with mild cyanosis, left lower extremity swollen with +3 pitting edema, erythema; right lower extremity with +2 edema Lungs: Bilateral breath sounds with diffuse rhonchi and crackles in all lung fields, copious tracheal secretions Abdomen: Obese, normal bowel sounds in all 4 quadrants, palpation reveals no organomegaly Musculoskeletal: Positive range of motion, no joint deformities Skin: Skin is warm and dry, left lower extremity with extensive erythema from the foot up to the thigh  LABS:  BMET Recent Labs  Lab 12/20/19 2350  NA 142  K 5.2*  CL 94*  CO2 36*  BUN 18  CREATININE 0.91  GLUCOSE 114*    Electrolytes Recent Labs  Lab 12/20/19 2350  CALCIUM 9.0    CBC Recent Labs  Lab 12/20/19 2154  WBC 8.5  HGB 16.7*  HCT 60.9*  PLT 185    Coag's No results for input(s): APTT, INR in the last 168 hours.  Sepsis  Markers Recent Labs  Lab 12/20/19 2154 12/20/19 2220  LATICACIDVEN 1.1 1.4    ABG Recent Labs  Lab 12/20/19 2118 12/20/19 2242 12/21/19 0043  PHART 7.22* 7.25* 7.26*  PCO2ART 103* 94* 89*  PO2ART 76* 66* 107    Liver Enzymes Recent Labs  Lab 12/20/19 2350  AST 14*  ALT 11  ALKPHOS 77  BILITOT 2.0*  ALBUMIN 3.6    Cardiac Enzymes No results for input(s): TROPONINI, PROBNP in the last 168 hours.  Glucose No results for input(s): GLUCAP in the last 168 hours.  Imaging DG Chest Portable 1 View  Result Date: 12/20/2019 CLINICAL DATA:  Shortness of breath, COVID exposure EXAM: PORTABLE CHEST 1 VIEW COMPARISON:  July 06, 2018 FINDINGS: There is mild cardiomegaly. Fluffy hazy airspace opacities are seen throughout both lungs. Small bilateral pleural effusions are present. No acute osseous abnormality. IMPRESSION: Bilateral airspace opacities which could be due to pulmonary edema and/or infectious etiology. Small bilateral pleural effusion. Electronically Signed  By: Jonna Clark M.D.   On: 12/20/2019 21:01   STUDIES:  2D echo  CULTURES: Blood cultures x2  ANTIBIOTICS: Rocephin 12/21/2019> Azithromycin 12/21/2019  LINES/TUBES: Peripheral IVs ET tube Foley catheter OG  DISCUSSION: 54 year old female with a history of COPD on home O2 at 4 L nasal cannula, previous intubation with prolonged vent support and tracheostomy, presenting with acute hypoxic and hypercarbic respiratory failure secondary to pneumonia, Covid exposure but test is negative, failed BiPAP necessitating emergent intubation.  ASSESSMENT / PLAN:  PULMONARY A: Acute hypoxic and hypercarbic respiratory failure requiring full vent support Obesity hypoventilation syndrome Acute COPD exacerbation Community-acquired pneumonia versus viral pneumonia Current everyday smoker P:   Full vent support with current settings Weaning trials as tolerated As needed ABG and chest x-ray Nebulized  bronchodilators and inhaled steroids Empiric antibiotics IV steroids Gentle IV diuresis if needed Repeat Covid test given exposure and risks Patient will prefer to be trach if she does not wean off the vent in the next 3 to 5 days.  Will address with POA/daughter and establish an appropriate timeframe for tracheostomy placement if patient does not wean off the vent in the next couple of days  CARDIOVASCULAR A:  Hypertension P:  Last 2D echo was Sep 20, 2017 and showed a left ventricular ejection fraction of 60%, mild mitral regurg Repeat 2D echo Trend cardiac enzymes Hemodynamic monitoring per ICU protocol  RENAL A:   Mild hypokalemia with a potassium of 5.2 P:   Trend renal indices and monitor and correct electrolyte abnormalities Avoid nephrotoxic drugs  HEMATOLOGIC A:   Extensive erythema and swelling of the left lower extremity-rule out DVT P:  Ultrasound of the left lower extremity to rule out DVT Elevate left lower extremity when in bed   INFECTIOUS A:   Community-acquired pneumonia P:   Trend procalcitonin and adjust antibiotics Follow-up with blood cultures Antibiotics as above  ENDOCRINE A:   Morbid obesity P:   Monitor blood glucose levels.  Currently not a diabetic.  NEUROLOGIC A:   Acute hypoxic and hypercarbic encephalopathy P:   RASS goal: 0 to -1 Fentanyl and propofol for vent sedation and discomfort Monitor neuro status per ICU protocol   Best Practice: Code Status: Full code Diet: N.p.o. GI prophylaxis: Famotidine VTE prophylaxis: Subcu heparin  FAMILY  - Updates: Patient's significant other updated at the bedside.  Patient's daughter/POA updated by the ED.  She will be updated by the ICU attending after interdisciplinary rounds  Loisann Roach S. [facility] ANP-BC Pulmonary and Critical Care Medicine [facility] Pager [phone] or [phone]  NB: This document was prepared using Dragon voice recognition software and may include  unintentional dictation errors.    12/21/2019, 3:09 AM

## 2019-12-21 NOTE — Consult Note (Addendum)
PHARMACY CONSULT NOTE   Pharmacy Consult for Electrolyte Monitoring and Replacement   Recent Labs: Potassium (mmol/L)  Date Value  12/20/2019 5.2 (H)  03/26/2014 3.9   Magnesium (mg/dL)  Date Value  83/66/2947 1.4 (L)  03/20/2012 1.5 (L)   Calcium (mg/dL)  Date Value  65/46/5035 9.0   Calcium, Total (mg/dL)  Date Value  46/56/8127 8.5   Albumin (g/dL)  Date Value  51/70/0174 3.6  03/26/2014 3.2 (L)   Phosphorus (mg/dL)  Date Value  94/49/6759 3.5   Sodium (mmol/L)  Date Value  12/20/2019 142  03/26/2014 142     Assessment: Ms. Eckersley Is 54 y.o female with PMH significant for has a past medical history of Anxiety, COPD, HTN, morbid obesity, and tobacco abuse. She presents hyperkalemic.  Goal of Therapy:  Electrolytes WNL.   Plan:  1. Will recheck potassium.  2. Will check Magnesium and phosphorus today.   Katha Cabal ,PharmD Clinical Pharmacist 12/21/2019 5:23 AM

## 2019-12-21 NOTE — Consult Note (Signed)
PHARMACY CONSULT NOTE   Pharmacy Consult for Electrolyte Monitoring and Replacement   Recent Labs: Potassium (mmol/L)  Date Value  12/21/2019 4.3  03/26/2014 3.9   Magnesium (mg/dL)  Date Value  02/63/7858 1.8  03/20/2012 1.5 (L)   Calcium (mg/dL)  Date Value  85/06/7739 8.8 (L)   Calcium, Total (mg/dL)  Date Value  28/78/6767 8.5   Albumin (g/dL)  Date Value  20/94/7096 3.6  03/26/2014 3.2 (L)   Phosphorus (mg/dL)  Date Value  28/36/6294 2.7   Sodium (mmol/L)  Date Value  12/21/2019 142  03/26/2014 142   Corrected Ca: 9.12 mg/dL  Assessment: Ms. Chirino Is 54 y.o female with PMH significant for has a past medical history of Anxiety, COPD, HTN, morbid obesity, and tobacco abuse presented with progressive shortness of breath and fatigue x3 days, found to be in acute hypercarbic and hypoxic respiratory failure; failed BiPAP and had to be emergently intubated  Goal of Therapy (while in ICU):  Potassium 4.0 - 5.1 mmol/L Magnesium 2.0 - 2.4 mg/dL All Other Electrolytes WNL  Plan:   2 grams IV magnesium sulfate x 1  Re-check electrolytes in am 8/9  [name] ,PharmD Clinical Pharmacist 12/21/2019 11:35 AM

## 2019-12-21 NOTE — Progress Notes (Signed)
*  PRELIMINARY RESULTS* Echocardiogram 2D Echocardiogram has been performed.  Cristela Blue 12/21/2019, 1:53 PM

## 2019-12-21 NOTE — Progress Notes (Signed)
2 unsuccessful PIV attempts by this RN. Pt is intubated and currently on Propofol. Current 2nd PIV no blood return. 2nd IV access needed for drugs not compatible.

## 2019-12-21 NOTE — ED Notes (Signed)
Lab called to send phlebotomist down to collect pt's morning labs

## 2019-12-22 DIAGNOSIS — J9601 Acute respiratory failure with hypoxia: Secondary | ICD-10-CM

## 2019-12-22 LAB — BASIC METABOLIC PANEL
Anion gap: 12 (ref 5–15)
BUN: 21 mg/dL — ABNORMAL HIGH (ref 6–20)
CO2: 31 mmol/L (ref 22–32)
Calcium: 9 mg/dL (ref 8.9–10.3)
Chloride: 97 mmol/L — ABNORMAL LOW (ref 98–111)
Creatinine, Ser: 0.86 mg/dL (ref 0.44–1.00)
GFR calc Af Amer: >60 mL/min (ref >60)
GFR calc non Af Amer: >60 mL/min (ref >60)
Glucose, Bld: 160 mg/dL — ABNORMAL HIGH (ref 70–99)
Potassium: 4.6 mmol/L (ref 3.5–5.1)
Sodium: 140 mmol/L (ref 135–145)

## 2019-12-22 LAB — BLOOD GAS, ARTERIAL
Acid-Base Excess: 12 mmol/L — ABNORMAL HIGH (ref 0.0–2.0)
Bicarbonate: 36.7 mmol/L — ABNORMAL HIGH (ref 20.0–28.0)
FIO2: 0.8
MECHVT: 500 mL
O2 Saturation: 99.3 %
PEEP: 10 cmH2O
Patient temperature: 37
RATE: 16 resp/min
pCO2 arterial: 45 mmHg (ref 32.0–48.0)
pH, Arterial: 7.52 — ABNORMAL HIGH (ref 7.350–7.450)
pO2, Arterial: 136 mmHg — ABNORMAL HIGH (ref 83.0–108.0)

## 2019-12-22 LAB — CBC
HCT: 57.7 % — ABNORMAL HIGH (ref 36.0–46.0)
Hemoglobin: 16.5 g/dL — ABNORMAL HIGH (ref 12.0–15.0)
MCH: 23.8 pg — ABNORMAL LOW (ref 26.0–34.0)
MCHC: 28.6 g/dL — ABNORMAL LOW (ref 30.0–36.0)
MCV: 83.1 fL (ref 80.0–100.0)
Platelets: 214 10*3/uL (ref 150–400)
RBC: 6.94 MIL/uL — ABNORMAL HIGH (ref 3.87–5.11)
RDW: 22.4 % — ABNORMAL HIGH (ref 11.5–15.5)
WBC: 8.2 10*3/uL (ref 4.0–10.5)
nRBC: 0 % (ref 0.0–0.2)

## 2019-12-22 LAB — TRIGLYCERIDES: Triglycerides: 154 mg/dL — ABNORMAL HIGH (ref <150)

## 2019-12-22 LAB — GLUCOSE, CAPILLARY
Glucose-Capillary: 116 mg/dL — ABNORMAL HIGH (ref 70–99)
Glucose-Capillary: 133 mg/dL — ABNORMAL HIGH (ref 70–99)
Glucose-Capillary: 158 mg/dL — ABNORMAL HIGH (ref 70–99)
Glucose-Capillary: 178 mg/dL — ABNORMAL HIGH (ref 70–99)
Glucose-Capillary: 178 mg/dL — ABNORMAL HIGH (ref 70–99)
Glucose-Capillary: 181 mg/dL — ABNORMAL HIGH (ref 70–99)

## 2019-12-22 LAB — SARS CORONAVIRUS 2 BY RT PCR (HOSPITAL ORDER, PERFORMED IN ~~LOC~~ HOSPITAL LAB): SARS Coronavirus 2: NEGATIVE

## 2019-12-22 LAB — LIPASE, BLOOD: Lipase: 30 U/L (ref 11–51)

## 2019-12-22 LAB — MAGNESIUM: Magnesium: 2.4 mg/dL (ref 1.7–2.4)

## 2019-12-22 MED ORDER — LACTATED RINGERS IV SOLN: INTRAVENOUS | Status: DC

## 2019-12-22 MED ORDER — NICOTINE 7 MG/24HR TD PT24
7.0000 mg | MEDICATED_PATCH | Freq: Every day | TRANSDERMAL | Status: DC
  Administered 2019-12-22 – 2019-12-29 (×4): 7 mg via TRANSDERMAL
  Filled 2019-12-22 (×9): qty 1

## 2019-12-22 MED ORDER — FENTANYL 2500MCG IN NS 250ML (10MCG/ML) PREMIX INFUSION
0.0000 ug/h | INTRAVENOUS | Status: DC
  Administered 2019-12-22: 25 ug/h via INTRAVENOUS
  Administered 2019-12-23 – 2019-12-25 (×3): 100 ug/h via INTRAVENOUS
  Administered 2019-12-26: 200 ug/h via INTRAVENOUS
  Administered 2019-12-26: 100 ug/h via INTRAVENOUS
  Administered 2019-12-27 (×2): 200 ug/h via INTRAVENOUS
  Administered 2019-12-28: 150 ug/h via INTRAVENOUS
  Administered 2019-12-28: 200 ug/h via INTRAVENOUS
  Administered 2019-12-29: 250 ug/h via INTRAVENOUS
  Filled 2019-12-22 (×11): qty 250

## 2019-12-22 NOTE — Consult Note (Signed)
PHARMACY CONSULT NOTE   Pharmacy Consult for Electrolyte Monitoring and Replacement   Recent Labs: Potassium (mmol/L)  Date Value  12/22/2019 4.6  03/26/2014 3.9   Magnesium (mg/dL)  Date Value  07/13/3141 2.4  03/20/2012 1.5 (L)   Calcium (mg/dL)  Date Value  88/87/5797 9.0   Calcium, Total (mg/dL)  Date Value  28/20/6015 8.5   Albumin (g/dL)  Date Value  61/53/7943 3.6  03/26/2014 3.2 (L)   Phosphorus (mg/dL)  Date Value  27/61/4709 2.7   Sodium (mmol/L)  Date Value  12/22/2019 140  03/26/2014 142   Corrected Ca: 9.12 mg/dL  Assessment: [name] Is 54 y.o female with PMH significant for has a past medical history of Anxiety, COPD, HTN, morbid obesity, and tobacco abuse presented with progressive shortness of breath and fatigue x3 days, found to be in acute hypercarbic and hypoxic respiratory failure; failed BiPAP and had to be emergently intubated.  MIVF: lactated ringers at 50 mL/hr  Goal of Therapy (while in ICU):  Potassium 4.0 - 5.1 mmol/L Magnesium 2.0 - 2.4 mg/dL All Other Electrolytes WNL  Plan:   No electrolyte replacement warranted today  Re-check electrolytes in am 8/10  [name] ,PharmD Clinical Pharmacist 12/22/2019 7:01 AM

## 2019-12-22 NOTE — Progress Notes (Signed)
Initial Nutrition Assessment  DOCUMENTATION CODES:   Morbid obesity  INTERVENTION:   If tube feeds initiated, recommend:  Vital HP @20ml /hr + Pro-Source 71ml QID via tube  Propofol: 31.8 ml/hr- provides 840kcal/day   Free water flushes 23ml q4 hours to maintain tube patency   Regimen provides 1480kcal/day, 86g/day protein and 573ml/day free water   Liquid MVI daily via tube   NUTRITION DIAGNOSIS:   Inadequate oral intake related to inability to eat (pt sedated and ventilated) as evidenced by NPO status.  GOAL:   Provide needs based on ASPEN/SCCM guidelines  MONITOR:   Vent status, Labs, Weight trends, Skin, I & O's  REASON FOR ASSESSMENT:   Consult Assessment of nutrition requirement/status  ASSESSMENT:   54 year old female with a history of CHF, COPD on home O2 at 4 L nasal cannula who presents with progressive shortness of breath and fatigue x3 days, found to be in acute hypercarbic and hypoxic respiratory failure; failed BiPAP and had to be emergently intubated  Pt sedated and ventilated. OGT in place to LIS with only 59 output. Suspect pt with good appetite and oral intake at baseline as pt is weight stable pta.   Medications reviewed and include: colace, heparin, insulin, solu-medrol, miralax, azithromycin, ceftriaxone, pepcid, LRS @50ml /hr, propofol  Labs reviewed: BUN 21(H), P 2.7 wnl, Mg 2.4 wnl Triglycerides 154(H) cbgs- 162, 135, 178 x 24 hrs AIC 6.9(H)- 8/8  Patient is currently intubated on ventilator support MV: 8.1 L/min Temp (24hrs), Avg:99.3 F (37.4 C), Min:98.4 F (36.9 C), Max:100 F (37.8 C)  Propofol: 31.8 ml/hr- provides 840kcal/day   MAP- >36mmHg  UOP-668ml  NUTRITION - FOCUSED PHYSICAL EXAM:    Most Recent Value  Orbital Region No depletion  Upper Arm Region No depletion  Thoracic and Lumbar Region No depletion  Buccal Region No depletion  Temple Region No depletion  Clavicle Bone Region No depletion  Clavicle and  Acromion Bone Region No depletion  Scapular Bone Region No depletion  Dorsal Hand No depletion  Patellar Region No depletion  Anterior Thigh Region No depletion  Posterior Calf Region No depletion  Edema (RD Assessment) Mild  Hair Reviewed  Eyes Reviewed  Mouth Reviewed  Skin Reviewed  Nails Reviewed     Diet Order:   Diet Order            Diet NPO time specified  Diet effective now                EDUCATION NEEDS:   Not appropriate for education at this time  Skin:  Skin Assessment: Reviewed RN Assessment (MASD groin)  Last BM:  pta  Height:   Ht Readings from Last 1 Encounters:  12/20/19 5' (1.524 m)    Weight:   Wt Readings from Last 1 Encounters:  12/22/19 119.2 kg    Ideal Body Weight:  45.45 kg  BMI:  Body mass index is 51.32 kg/m.  Estimated Nutritional Needs:   Kcal:  1000-1200kcal/day  Protein:  114g/day  Fluid:  1.4-1.6L/day  02/19/20 MS, RD, LDN Please refer to [facility] for RD and/or RD on-call/weekend/after hours pager

## 2019-12-22 NOTE — Plan of Care (Addendum)
Patient continues to be intubated and sedated. Currently on 90% FIO2 and oxygen sats are 98% with coarse breath sounds. Patient does follow commands and is able to grip and move all extremities as requested. OG is connnected to LWIS per order draining brownish/green stomach contents. Foley catheter draining brown cloudy urine, LR started last night to try and increase urine output with little improvement noted, patient had a total of 410 ml out this shift. Left lower leg is red and warm to the touch, negative for a DVT. SCD's are on per order. Oral care provided every 2 hours, HOB at 30 degrees, High fall risk patient who has been safe and free of falls this shift.

## 2019-12-22 NOTE — Progress Notes (Signed)
Report received from [place], [place] and pt moved to ICU RM 13 at this time. Pt resting well with no s/s of distress at this time.

## 2019-12-22 NOTE — Progress Notes (Signed)
Password changed per Daughter Alben Deeds, RN also discussed visitation policy. Pt became agitated and restless during WUA. De-sating to 70's and dyssynchronous with ventilator. WUA ended and sedation resumed. Pt followed commands this morning with assessment. Later in shift not following commands, but is dyssynchronous with ventilator when awakened. VSS otherwise. Foley intact with adequate output. OG intact to intermittent suction. Will continue to monitor.

## 2019-12-22 NOTE — Progress Notes (Signed)
Name: [name] MRN: [id number] DOB: 1966-05-11     CONSULTATION DATE: 12/20/2019 Subjective and objectives: Remains on vent, afebrile and no major events last night  PAST MEDICAL HISTORY :   has a past medical history of Anxiety, COPD (chronic obstructive pulmonary disease) ([place]), Essential hypertension, History of echocardiogram, Morbid obesity ([place]), and Tobacco abuse.  has a past surgical history that includes Cholecystectomy; Abdominal hysterectomy; Hernia repair; Extubation (endotracheal) in or (N/A, 08/14/2017); and TEE without cardioversion (N/A, 09/20/2017). Prior to Admission medications   Medication Sig Start Date End Date Taking? Authorizing Provider  ADVAIR HFA 115-21 MCG/ACT inhaler Inhale 2 puffs into the lungs 2 (two) times daily. 11/21/19  Yes [provider]  albuterol (PROVENTIL HFA;VENTOLIN HFA) 108 (90 Base) MCG/ACT inhaler Inhale 2 puffs into the lungs every 6 (six) hours as needed for wheezing or shortness of breath. 06/12/17  Yes Gouru, Aruna, MD  clonazePAM (KLONOPIN) 0.5 MG tablet Take 0.5 mg by mouth 3 (three) times daily as needed for anxiety.    Yes [provider]  furosemide (LASIX) 40 MG tablet Take 40 mg by mouth 2 (two) times daily. 11/21/19  Yes [provider]  ipratropium-albuterol (DUONEB) 0.5-2.5 (3) MG/3ML SOLN Take 3 mLs by nebulization every 6 (six) hours. Patient taking differently: Take 3 mLs by nebulization 2 (two) times daily.  08/17/17  Yes Epifanio Lesches, MD  ipratropium-albuterol (DUONEB) 0.5-2.5 (3) MG/3ML SOLN Inhale 3 mLs into the lungs every 8 (eight) hours as needed. 11/21/19  Yes [provider]  omeprazole (PRILOSEC) 20 MG capsule Take 20 mg by mouth daily.   Yes [provider]  SPIRIVA HANDIHALER 18 MCG inhalation capsule Place 1 capsule into inhaler and inhale daily. 11/21/19  Yes [provider]  cyclobenzaprine (FLEXERIL) 10 MG tablet Take 10 mg by mouth 3 (three) times daily as  needed for muscle spasms.  Patient not taking: Reported on 12/21/2019 08/16/16   [provider]  fluticasone (FLOVENT HFA) 44 MCG/ACT inhaler Inhale 2 puffs into the lungs daily. Patient not taking: Reported on 12/21/2019    [provider]  magic mouthwash w/lidocaine SOLN Take 5 mLs by mouth 4 (four) times daily. Patient not taking: Reported on 12/21/2019 11/16/17   Cuthriell, Charline Bills, PA-C   No Known Allergies  FAMILY HISTORY:  family history includes Dementia in her mother; Heart attack in her father. SOCIAL HISTORY:  reports that she has been smoking cigarettes. She has a 30.00 pack-year smoking history. She has never used smokeless tobacco. She reports that she does not drink alcohol and does not use drugs.  REVIEW OF SYSTEMS:   Unable to obtain due to critical illness      Estimated body mass index is 51.32 kg/m as calculated from the following:   Height as of this encounter: 5' (1.524 m).   Weight as of this encounter: 119.2 kg.    VITAL SIGNS: Temp:  [98.2 F (36.8 C)-100 F (37.8 C)] 98.2 F (36.8 C) (08/09 1130) Pulse Rate:  [71-101] 73 (08/09 1130) Resp:  [16-23] 16 (08/09 1130) BP: (102-133)/(62-93) 124/87 (08/09 1100) SpO2:  [73 %-98 %] 95 % (08/09 1130) FiO2 (%):  [80 %-100 %] 80 % (08/09 1053) Weight:  [119.2 kg] 119.2 kg (08/09 0500)   I/O last 3 completed shifts: In: 1463.7 [I.V.:1073.4; IV Piggyback:390.4] Out: 865 [Urine:665; Emesis/NG output:200] Total I/O In: 595.5 [I.V.:485.7; IV Piggyback:109.7] Out: 295 [Urine:295]   SpO2: 95 % O2 Flow Rate (L/min): 10 L/min FiO2 (%): (  S) 80 %   Physical Examination:  Sedated with Propofol to RASS of -3 On vent, no distress,BEAE and no rales S1 & S2 regular and no murmur Benign abdomen with normal peristalses No leg edema  MEDICATIONS: I have reviewed all medications and confirmed regimen as documented   CULTURE RESULTS   Recent Results (from the past 240 hour(s))  MRSA PCR  Screening     Status: None   Collection Time: 12/20/19  8:08 PM   Specimen: Nasal Mucosa; Nasopharyngeal  Result Value Ref Range Status   MRSA by PCR NEGATIVE NEGATIVE Final    Comment:        The GeneXpert MRSA Assay (FDA approved for NASAL specimens only), is one component of a comprehensive MRSA colonization surveillance program. It is not intended to diagnose MRSA infection nor to guide or monitor treatment for MRSA infections. Performed at [facility], [address] [place], [place] [address]   SARS Coronavirus 2 by RT PCR (hospital order, performed in [facility] hospital lab) Nasopharyngeal Nasopharyngeal Swab     Status: None   Collection Time: 12/21/19  4:12 AM   Specimen: Nasopharyngeal Swab  Result Value Ref Range Status   SARS Coronavirus 2 NEGATIVE NEGATIVE Final    Comment: (NOTE) SARS-CoV-2 target nucleic acids are NOT DETECTED.  The SARS-CoV-2 RNA is generally detectable in upper and lower respiratory specimens during the acute phase of infection. The lowest concentration of SARS-CoV-2 viral copies this assay can detect is 250 copies / mL. A negative result does not preclude SARS-CoV-2 infection and should not be used as the sole basis for treatment or other patient management decisions.  A negative result may occur with improper specimen collection / handling, submission of specimen other than nasopharyngeal swab, presence of viral mutation(s) within the areas targeted by this assay, and inadequate number of viral copies (<250 copies / mL). A negative result must be combined with clinical observations, patient history, and epidemiological information.  Fact Sheet for Patients:   StrictlyIdeas.no  Fact Sheet for Healthcare Providers: BankingDealers.co.za  This test is not yet approved or  cleared by the [place] FDA and has been authorized for detection and/or diagnosis of SARS-CoV-2 by FDA  under an Emergency Use Authorization (EUA).  This EUA will remain in effect (meaning this test can be used) for the duration of the COVID-19 declaration under Section 564(b)(1) of the Act, 21 U.S.C. section 360bbb-3(b)(1), unless the authorization is terminated or revoked sooner.  Performed at [facility], [address] [place], [place] [address]   Culture, blood (Routine X 2) w Reflex to ID Panel     Status: None (Preliminary result)   Collection Time: 12/21/19 10:33 AM   Specimen: BLOOD  Result Value Ref Range Status   Specimen Description BLOOD [facility]  Final   Special Requests   Final    BOTTLES DRAWN AEROBIC AND ANAEROBIC Blood Culture adequate volume   Culture   Final    NO GROWTH < 24 HOURS Performed at [facility], [address], [place], [place] [address]    Report Status PENDING  Incomplete  Culture, blood (Routine X 2) w Reflex to ID Panel     Status: None (Preliminary result)   Collection Time: 12/21/19 10:45 AM   Specimen: BLOOD  Result Value Ref Range Status   Specimen Description BLOOD [facility]  Final   Special Requests   Final    BOTTLES DRAWN AEROBIC ONLY Blood Culture results may not be  optimal due to an inadequate volume of blood received in culture bottles   Culture   Final    NO GROWTH < 24 HOURS Performed at [facility], [address] [place], [place] [address]    Report Status PENDING  Incomplete          IMAGING    ECHOCARDIOGRAM COMPLETE  Result Date: 12/21/2019    ECHOCARDIOGRAM REPORT   Patient Name:   IOANNA COLQUHOUN Date of Exam: 12/21/2019 Medical Rec #:  [id number]      Height:       60.0 in Accession #:    [id number]     Weight:       297.8 lb Date of Birth:  01-24-1966      BSA:          2.211 m Patient Age:    54 years       BP:           133/93 mmHg Patient Gender: F              HR:           94 bpm. Exam Location:  [facility] Procedure: 2D Echo, Cardiac Doppler and Color Doppler Indications:     Acute respiratory  insufficiency 518.82  History:         Patient has prior history of Echocardiogram examinations, most                  recent 09/20/2017. COPD; Risk Factors:Hypertension. Tobacco                  abuse.  Sonographer:     Sherrie Sport RDCS (AE) Referring Phys:  [place] Diagnosing Phys: Skeet Latch MD  Sonographer Comments: Echo performed with patient supine and on artificial respirator, Technically difficult study due to poor echo windows and no apical window. IMPRESSIONS  1. Left ventricular ejection fraction, by estimation, is 65 to 70%. The left ventricle has normal function. The left ventricle has no regional wall motion abnormalities. There is mild concentric left ventricular hypertrophy. Left ventricular diastolic function could not be evaluated.  2. Right ventricular systolic function is normal. The right ventricular size is normal. There is normal pulmonary artery systolic pressure.  3. The mitral valve is normal in structure. No evidence of mitral valve regurgitation. No evidence of mitral stenosis.  4. The aortic valve is tricuspid. Aortic valve regurgitation is not visualized. No aortic stenosis is present.  5. The inferior vena cava is normal in size with greater than 50% respiratory variability, suggesting right atrial pressure of 3 mmHg. FINDINGS  Left Ventricle: Left ventricular ejection fraction, by estimation, is 65 to 70%. The left ventricle has normal function. The left ventricle has no regional wall motion abnormalities. The left ventricular internal cavity size was normal in size. There is  mild concentric left ventricular hypertrophy. Left ventricular diastolic function could not be evaluated. Right Ventricle: The right ventricular size is normal. No increase in right ventricular wall thickness. Right ventricular systolic function is normal. There is normal pulmonary artery systolic pressure. The tricuspid regurgitant velocity is 1.95 m/s, and  with an assumed right  atrial pressure of 3 mmHg, the estimated right ventricular systolic pressure is 96.7 mmHg. Left Atrium: Left atrial size was normal in size. Right Atrium: Right atrial size was normal in size. Pericardium: Trivial pericardial effusion is present. The pericardial effusion is posterior to the left ventricle. Mitral Valve: The mitral valve is  normal in structure. Normal mobility of the mitral valve leaflets. No evidence of mitral valve regurgitation. No evidence of mitral valve stenosis. Tricuspid Valve: The tricuspid valve is normal in structure. Tricuspid valve regurgitation is not demonstrated. No evidence of tricuspid stenosis. Aortic Valve: The aortic valve is tricuspid. Aortic valve regurgitation is not visualized. No aortic stenosis is present. Pulmonic Valve: The pulmonic valve was normal in structure. Pulmonic valve regurgitation is not visualized. No evidence of pulmonic stenosis. Aorta: The aortic root is normal in size and structure. Venous: The inferior vena cava is normal in size with greater than 50% respiratory variability, suggesting right atrial pressure of 3 mmHg. IAS/Shunts: No atrial level shunt detected by color flow Doppler.  LEFT VENTRICLE PLAX 2D LVIDd:         3.95 cm LVIDs:         2.47 cm LV PW:         1.13 cm LV IVS:        1.12 cm LVOT diam:     2.00 cm LVOT Area:     3.14 cm  LEFT ATRIUM         Index LA diam:    2.10 cm 0.95 cm/m                        PULMONIC VALVE AORTA                 PV Vmax:       0.52 m/s Ao Root diam: 2.60 cm PV Peak grad:  1.1 mmHg  TRICUSPID VALVE TR Peak grad:   15.2 mmHg TR Vmax:        195.00 cm/s  SHUNTS Systemic Diam: 2.00 cm Skeet Latch MD Electronically signed by Skeet Latch MD Signature Date/Time: 12/21/2019/3:10:17 PM    Final      Nutrition Status: Nutrition Problem: Inadequate oral intake Etiology: inability to eat (pt sedated and ventilated) Signs/Symptoms: NPO status       Indwelling Urinary Catheter continued, requirement  due to   Reason to continue Indwelling Urinary Catheter strict Intake/Output monitoring for hemodynamic instability   Central Line/ continued, requirement due to  Reason to continue [place] of central venous pressure or other hemodynamic parameters and poor IV access   Ventilator continued, requirement due to severe respiratory failure   Ventilator Sedation RASS 0 to -2      ASSESSMENT AND PLAN SYNOPSIS   Severe ACUTE Hypoxic and Hypercapnic Respiratory Failure ARDS with P/F 170 on 80% PEEP of 10 Pneumonia.Bil airspace disease (improved) -Rocephin + Zithromas -continue Full MV support -continue Bronchodilator Therapy -Wean Fio2 and PEEP as tolerated -will perform SAT/SBT when respiratory parameters are met -VAP/VENT bundle implementation  ACUTE SYSTOLIC CARDIAC FAILURE- EF -oxygen as needed -Lasix as tolerated -follow up cardiac enzymes as indicated -follow up cardiology recs   SEVERE COPD EXACERBATION -continue IV steroids as prescribed -continue NEB THERAPY as prescribed -morphine as needed -wean fio2 as needed and tolerated    Morbid obesity, possible OSA.   Will certainly impact respiratory mechanics, ventilator weaning Suspect will need to consider additional PEEP, possible extubation to BiPAP when appropriate to consider   Prerenal azotemia with intravascular volume depletion -continue Foley Catheter-assess need -Avoid nephrotoxic agents -Follow urine output, BMP -Ensure adequate renal perfusion, optimize hydration -Renal dose medications  Hypertriglyceridemia On Propofol -Optimize Propofol and start on Fentanyl for sedation -Monitor TG  Hyperglycemia -Glycemic control and taper off steroids as tolerated  LT L E DVT was ruled out with venous Doppler    NEUROLOGY - intubated and sedated - minimal sedation to achieve a RASS goal: -1 Wake up assessment pending  Acute toxic metabolic encephalopathy, need for sedation Goal RASS -2  to -3   SHOCK-SEPSIS/HYPOVOLUMIC/CARDIOGENIC -use vasopressors to keep MAP>65 -follow ABG and LA -follow up cultures -emperic ABX -consider stress dose steroids -aggressive IV fluid resuscitation  CARDIAC ICU monitoring  ID -continue IV abx as prescibed -follow up cultures  GI GI PROPHYLAXIS as indicated  NUTRITIONAL STATUS DIET-->TF's as tolerated Constipation protocol as indicated   ENDO - will use ICU hypoglycemic\Hyperglycemia protocol if needed    ELECTROLYTES -follow labs as needed -replace as needed -pharmacy consultation and following  Critical care time 35 min

## 2019-12-23 ENCOUNTER — Inpatient Hospital Stay: Payer: Medicaid Other

## 2019-12-23 DIAGNOSIS — J9601 Acute respiratory failure with hypoxia: Secondary | ICD-10-CM | POA: Diagnosis not present

## 2019-12-23 DIAGNOSIS — J9602 Acute respiratory failure with hypercapnia: Secondary | ICD-10-CM

## 2019-12-23 LAB — COMPREHENSIVE METABOLIC PANEL
ALT: 8 U/L (ref 0–44)
AST: 11 U/L — ABNORMAL LOW (ref 15–41)
Albumin: 3.1 g/dL — ABNORMAL LOW (ref 3.5–5.0)
Alkaline Phosphatase: 52 U/L (ref 38–126)
Anion gap: 11 (ref 5–15)
BUN: 19 mg/dL (ref 6–20)
CO2: 31 mmol/L (ref 22–32)
Calcium: 9.1 mg/dL (ref 8.9–10.3)
Chloride: 96 mmol/L — ABNORMAL LOW (ref 98–111)
Creatinine, Ser: 0.66 mg/dL (ref 0.44–1.00)
GFR calc Af Amer: >60 mL/min (ref >60)
GFR calc non Af Amer: >60 mL/min (ref >60)
Glucose, Bld: 145 mg/dL — ABNORMAL HIGH (ref 70–99)
Potassium: 4.7 mmol/L (ref 3.5–5.1)
Sodium: 138 mmol/L (ref 135–145)
Total Bilirubin: 1.5 mg/dL — ABNORMAL HIGH (ref 0.3–1.2)
Total Protein: 6.5 g/dL (ref 6.5–8.1)

## 2019-12-23 LAB — CBC WITH DIFFERENTIAL/PLATELET
Abs Immature Granulocytes: 0.02 10*3/uL (ref 0.00–0.07)
Basophils Absolute: 0 10*3/uL (ref 0.0–0.1)
Basophils Relative: 0 %
Eosinophils Absolute: 0 10*3/uL (ref 0.0–0.5)
Eosinophils Relative: 0 %
HCT: 56.5 % — ABNORMAL HIGH (ref 36.0–46.0)
Hemoglobin: 16.7 g/dL — ABNORMAL HIGH (ref 12.0–15.0)
Immature Granulocytes: 0 %
Lymphocytes Relative: 6 %
Lymphs Abs: 0.4 10*3/uL — ABNORMAL LOW (ref 0.7–4.0)
MCH: 24.1 pg — ABNORMAL LOW (ref 26.0–34.0)
MCHC: 29.6 g/dL — ABNORMAL LOW (ref 30.0–36.0)
MCV: 81.5 fL (ref 80.0–100.0)
Monocytes Absolute: 0.3 10*3/uL (ref 0.1–1.0)
Monocytes Relative: 5 %
Neutro Abs: 6.2 10*3/uL (ref 1.7–7.7)
Neutrophils Relative %: 89 %
Platelets: 198 10*3/uL (ref 150–400)
RBC: 6.93 MIL/uL — ABNORMAL HIGH (ref 3.87–5.11)
RDW: 22.1 % — ABNORMAL HIGH (ref 11.5–15.5)
Smear Review: NORMAL
WBC: 7 10*3/uL (ref 4.0–10.5)
nRBC: 0 % (ref 0.0–0.2)

## 2019-12-23 LAB — GLUCOSE, CAPILLARY: Glucose-Capillary: 163 mg/dL — ABNORMAL HIGH (ref 70–99)

## 2019-12-23 LAB — TRIGLYCERIDES: Triglycerides: 210 mg/dL — ABNORMAL HIGH (ref <150)

## 2019-12-23 MED ORDER — VITAL HIGH PROTEIN PO LIQD
1000.0000 mL | ORAL | Status: DC
  Administered 2019-12-23 – 2019-12-24 (×2): 1000 mL

## 2019-12-23 MED ORDER — PROSOURCE TF PO LIQD
45.0000 mL | Freq: Four times a day (QID) | ORAL | Status: DC
  Administered 2019-12-23 – 2019-12-25 (×8): 45 mL
  Filled 2019-12-23: qty 45

## 2019-12-23 NOTE — Progress Notes (Signed)
Name: [name] MRN: [id number] DOB: 1966-02-11     CONSULTATION DATE: 12/20/2019    PAST MEDICAL HISTORY :   has a past medical history of Anxiety, COPD (chronic obstructive pulmonary disease) ([place]), Essential hypertension, History of echocardiogram, Morbid obesity ([place]), and Tobacco abuse.  has a past surgical history that includes Cholecystectomy; Abdominal hysterectomy; Hernia repair; Extubation (endotracheal) in or (N/A, 08/14/2017); and TEE without cardioversion (N/A, 09/20/2017). Prior to Admission medications   Medication Sig Start Date End Date Taking? Authorizing Provider  ADVAIR HFA 115-21 MCG/ACT inhaler Inhale 2 puffs into the lungs 2 (two) times daily. 11/21/19  Yes [provider]  albuterol (PROVENTIL HFA;VENTOLIN HFA) 108 (90 Base) MCG/ACT inhaler Inhale 2 puffs into the lungs every 6 (six) hours as needed for wheezing or shortness of breath. 06/12/17  Yes Gouru, Aruna, MD  clonazePAM (KLONOPIN) 0.5 MG tablet Take 0.5 mg by mouth 3 (three) times daily as needed for anxiety.    Yes [provider]  furosemide (LASIX) 40 MG tablet Take 40 mg by mouth 2 (two) times daily. 11/21/19  Yes [provider]  ipratropium-albuterol (DUONEB) 0.5-2.5 (3) MG/3ML SOLN Take 3 mLs by nebulization every 6 (six) hours. Patient taking differently: Take 3 mLs by nebulization 2 (two) times daily.  08/17/17  Yes Epifanio Lesches, MD  ipratropium-albuterol (DUONEB) 0.5-2.5 (3) MG/3ML SOLN Inhale 3 mLs into the lungs every 8 (eight) hours as needed. 11/21/19  Yes [provider]  omeprazole (PRILOSEC) 20 MG capsule Take 20 mg by mouth daily.   Yes [provider]  SPIRIVA HANDIHALER 18 MCG inhalation capsule Place 1 capsule into inhaler and inhale daily. 11/21/19  Yes [provider]  cyclobenzaprine (FLEXERIL) 10 MG tablet Take 10 mg by mouth 3 (three) times daily as needed for muscle spasms.  Patient not taking: Reported on 12/21/2019 08/16/16   [provider]  fluticasone (FLOVENT HFA) 44 MCG/ACT inhaler Inhale 2 puffs into the lungs daily. Patient not taking: Reported on 12/21/2019    [provider]  magic mouthwash w/lidocaine SOLN Take 5 mLs by mouth 4 (four) times daily. Patient not taking: Reported on 12/21/2019 11/16/17   Cuthriell, Charline Bills, PA-C   No Known Allergies  FAMILY HISTORY:  family history includes Dementia in her mother; Heart attack in her father. SOCIAL HISTORY:  reports that she has been smoking cigarettes. She has a 30.00 pack-year smoking history. She has never used smokeless tobacco. She reports that she does not drink alcohol and does not use drugs.  REVIEW OF SYSTEMS:   Unable to obtain due to critical illness      Estimated body mass index is 51.28 kg/m as calculated from the following:   Height as of this encounter: 5' (1.524 m).   Weight as of this encounter: 119.1 kg.    VITAL SIGNS: Temp:  [97.7 F (36.5 C)-99.1 F (37.3 C)] 98.2 F (36.8 C) (08/10 2200) Pulse Rate:  [53-71] 56 (08/10 2200) Resp:  [16-17] 16 (08/10 2200) BP: (93-109)/(59-78) 100/62 (08/10 2200) SpO2:  [92 %-100 %] 95 % (08/10 2200) FiO2 (%):  [30 %-70 %] 40 % (08/10 1957) Weight:  [119.1 kg] 119.1 kg (08/10 0416)   I/O last 3 completed shifts: In: 3379.3 [I.V.:3149.6; NG/GT:20; IV Piggyback:209.7] Out: 1600 [Urine:1300; Emesis/NG output:300] No intake/output data recorded.   SpO2: 95 % O2 Flow Rate (L/min): 10 L/min FiO2 (%): 40 %   Physical Examination:  Sedated with Propofol to RASS of -3 On vent,  no distress,BEAE and no rales S1 & S2 regular and no murmur Benign abdomen with normal peristalses No leg edema    CULTURE RESULTS   Recent Results (from the past 240 hour(s))  MRSA PCR Screening     Status: None   Collection Time: 12/20/19  8:08 PM   Specimen: Nasal Mucosa; Nasopharyngeal  Result Value Ref Range Status   MRSA by PCR NEGATIVE NEGATIVE Final    Comment:        The GeneXpert  MRSA Assay (FDA approved for NASAL specimens only), is one component of a comprehensive MRSA colonization surveillance program. It is not intended to diagnose MRSA infection nor to guide or monitor treatment for MRSA infections. Performed at [facility], [address] [place], [place] [address]   SARS Coronavirus 2 by RT PCR (hospital order, performed in [facility] hospital lab) Nasopharyngeal Nasopharyngeal Swab     Status: None   Collection Time: 12/21/19  4:12 AM   Specimen: Nasopharyngeal Swab  Result Value Ref Range Status   SARS Coronavirus 2 NEGATIVE NEGATIVE Final    Comment: (NOTE) SARS-CoV-2 target nucleic acids are NOT DETECTED.  The SARS-CoV-2 RNA is generally detectable in upper and lower respiratory specimens during the acute phase of infection. The lowest concentration of SARS-CoV-2 viral copies this assay can detect is 250 copies / mL. A negative result does not preclude SARS-CoV-2 infection and should not be used as the sole basis for treatment or other patient management decisions.  A negative result may occur with improper specimen collection / handling, submission of specimen other than nasopharyngeal swab, presence of viral mutation(s) within the areas targeted by this assay, and inadequate number of viral copies (<250 copies / mL). A negative result must be combined with clinical observations, patient history, and epidemiological information.  Fact Sheet for Patients:   StrictlyIdeas.no  Fact Sheet for Healthcare Providers: BankingDealers.co.za  This test is not yet approved or  cleared by the [place] FDA and has been authorized for detection and/or diagnosis of SARS-CoV-2 by FDA under an Emergency Use Authorization (EUA).  This EUA will remain in effect (meaning this test can be used) for the duration of the COVID-19 declaration under Section 564(b)(1) of the Act, 21 U.S.C. section  360bbb-3(b)(1), unless the authorization is terminated or revoked sooner.  Performed at [facility], [address] [place], [place] [address]   Culture, blood (Routine X 2) w Reflex to ID Panel     Status: None (Preliminary result)   Collection Time: 12/21/19 10:33 AM   Specimen: BLOOD  Result Value Ref Range Status   Specimen Description BLOOD [facility]  Final   Special Requests   Final    BOTTLES DRAWN AEROBIC AND ANAEROBIC Blood Culture adequate volume   Culture   Final    NO GROWTH 2 DAYS Performed at [facility], [address] [place], [place] [address]    Report Status PENDING  Incomplete  Culture, blood (Routine X 2) w Reflex to ID Panel     Status: None (Preliminary result)   Collection Time: 12/21/19 10:45 AM   Specimen: BLOOD  Result Value Ref Range Status   Specimen Description BLOOD [facility]  Final   Special Requests   Final    BOTTLES DRAWN AEROBIC ONLY Blood Culture results may not be optimal due to an inadequate volume of blood received in culture bottles   Culture   Final    NO GROWTH 2 DAYS Performed at [facility], [place], [place] [address]    Report Status PENDING  Incomplete  SARS Coronavirus 2 by RT PCR (hospital order, performed in [facility] hospital lab) Nasopharyngeal Nasopharyngeal Swab     Status: None   Collection Time: 12/22/19 11:28 AM   Specimen: Nasopharyngeal Swab  Result Value Ref Range Status   SARS Coronavirus 2 NEGATIVE NEGATIVE Final    Comment: (NOTE) SARS-CoV-2 target nucleic acids are NOT DETECTED.  The SARS-CoV-2 RNA is generally detectable in upper and lower respiratory specimens during the acute phase of infection. The lowest concentration of SARS-CoV-2 viral copies this assay can detect is 250 copies / mL. A negative result does not preclude SARS-CoV-2 infection and should not be used as the sole basis for treatment or other patient management decisions.  A negative result may occur  with improper specimen collection / handling, submission of specimen other than nasopharyngeal swab, presence of viral mutation(s) within the areas targeted by this assay, and inadequate number of viral copies (<250 copies / mL). A negative result must be combined with clinical observations, patient history, and epidemiological information.  Fact Sheet for Patients:   StrictlyIdeas.no  Fact Sheet for Healthcare Providers: BankingDealers.co.za  This test is not yet approved or  cleared by the [place] FDA and has been authorized for detection and/or diagnosis of SARS-CoV-2 by FDA under an Emergency Use Authorization (EUA).  This EUA will remain in effect (meaning this test can be used) for the duration of the COVID-19 declaration under Section 564(b)(1) of the Act, 21 U.S.C. section 360bbb-3(b)(1), unless the authorization is terminated or revoked sooner.  Performed at [facility], [address] [place], [place] [address]           IMAGING    DG Chest Port 1 View  Result Date: 12/23/2019 CLINICAL DATA:  Pneumonia. EXAM: PORTABLE CHEST 1 VIEW COMPARISON:  One-view chest x-ray 12/21/2019 FINDINGS: Endotracheal tube is stable and in satisfactory position. Enteric tube courses off the inferior border of the film. The heart is enlarged. Mild edema is present. Bibasilar airspace opacities and effusions are present. Overall aeration is stable. IMPRESSION: 1. Stable cardiomegaly and mild edema compatible with congestive heart failure. 2. Bibasilar airspace disease and effusions likely reflects atelectasis. Electronically Signed   By: San Morelle M.D.   On: 12/23/2019 05:55     Nutrition Status: Nutrition Problem: Inadequate oral intake Etiology: inability to eat (pt sedated and ventilated) Signs/Symptoms: NPO status   ASSESSMENT AND PLAN   Acute on chronic respiratory failure with baseline home O2 4 L/min  nasal cannula ARDS with P/F 170 on 80% PEEP of 10 Pneumonia.Bil airspace disease (improved) COPD exacerbation Tested negative for COVID-19 twice -Rocephin + Zithromas -continue Full MV support -continue Bronchodilator Therapy -Wean Fio2 and PEEP as tolerated -will perform SAT/SBT when respiratory parameters are met -VAP/VENT bundle implementation   Prerenal azotemia with intravascular volume depletion (improved)  Hypertriglyceridemia On Propofol -Optimize Propofol and start on Fentanyl for sedation -Monitor TG  Hyperglycemia -Glycemic control and taper off steroids as tolerated  LT L E DVT was ruled out with venous Doppler  DVT & GI prophylaxis.  Continue supportive care.  Critical care time 35 minutes

## 2019-12-23 NOTE — Consult Note (Signed)
PHARMACY CONSULT NOTE   Pharmacy Consult for Electrolyte Monitoring and Replacement   Recent Labs: Potassium (mmol/L)  Date Value  12/23/2019 4.7  03/26/2014 3.9   Magnesium (mg/dL)  Date Value  55/97/4163 2.4  03/20/2012 1.5 (L)   Calcium (mg/dL)  Date Value  84/53/6468 9.1   Calcium, Total (mg/dL)  Date Value  08/03/2246 8.5   Albumin (g/dL)  Date Value  25/00/3704 3.1 (L)  03/26/2014 3.2 (L)   Phosphorus (mg/dL)  Date Value  88/89/1694 2.7   Sodium (mmol/L)  Date Value  12/23/2019 138  03/26/2014 142   Corrected Ca: 9.12 mg/dL  Assessment: [name] Is 54 y.o female with PMH significant for has a past medical history of Anxiety, COPD, HTN, morbid obesity, and tobacco abuse presented with progressive shortness of breath and fatigue x3 days, found to be in acute hypercarbic and hypoxic respiratory failure; failed BiPAP and had to be emergently intubated.  MIVF: lactated ringers at 50 mL/hr  Goal of Therapy (while in ICU):  Potassium 4.0 - 5.1 mmol/L Magnesium 2.0 - 2.4 mg/dL All Other Electrolytes WNL  Plan:   No electrolyte replacement warranted today  Re-check electrolytes in AM 8/11  [name] 12/23/2019 8:28 AM

## 2019-12-24 ENCOUNTER — Inpatient Hospital Stay: Payer: Self-pay

## 2019-12-24 DIAGNOSIS — J9602 Acute respiratory failure with hypercapnia: Secondary | ICD-10-CM | POA: Diagnosis not present

## 2019-12-24 DIAGNOSIS — J9601 Acute respiratory failure with hypoxia: Secondary | ICD-10-CM | POA: Diagnosis not present

## 2019-12-24 LAB — GLUCOSE, CAPILLARY
Glucose-Capillary: 132 mg/dL — ABNORMAL HIGH (ref 70–99)
Glucose-Capillary: 143 mg/dL — ABNORMAL HIGH (ref 70–99)
Glucose-Capillary: 148 mg/dL — ABNORMAL HIGH (ref 70–99)
Glucose-Capillary: 151 mg/dL — ABNORMAL HIGH (ref 70–99)
Glucose-Capillary: 154 mg/dL — ABNORMAL HIGH (ref 70–99)
Glucose-Capillary: 162 mg/dL — ABNORMAL HIGH (ref 70–99)
Glucose-Capillary: 174 mg/dL — ABNORMAL HIGH (ref 70–99)

## 2019-12-24 LAB — BASIC METABOLIC PANEL
Anion gap: 10 (ref 5–15)
BUN: 19 mg/dL (ref 6–20)
CO2: 30 mmol/L (ref 22–32)
Calcium: 8.9 mg/dL (ref 8.9–10.3)
Chloride: 98 mmol/L (ref 98–111)
Creatinine, Ser: 0.67 mg/dL (ref 0.44–1.00)
GFR calc Af Amer: >60 mL/min (ref >60)
GFR calc non Af Amer: >60 mL/min (ref >60)
Glucose, Bld: 151 mg/dL — ABNORMAL HIGH (ref 70–99)
Potassium: 4.9 mmol/L (ref 3.5–5.1)
Sodium: 138 mmol/L (ref 135–145)

## 2019-12-24 LAB — CBC WITH DIFFERENTIAL/PLATELET
Abs Immature Granulocytes: 0.03 10*3/uL (ref 0.00–0.07)
Basophils Absolute: 0 10*3/uL (ref 0.0–0.1)
Basophils Relative: 0 %
Eosinophils Absolute: 0 10*3/uL (ref 0.0–0.5)
Eosinophils Relative: 0 %
HCT: 57.3 % — ABNORMAL HIGH (ref 36.0–46.0)
Hemoglobin: 16.8 g/dL — ABNORMAL HIGH (ref 12.0–15.0)
Immature Granulocytes: 1 %
Lymphocytes Relative: 7 %
Lymphs Abs: 0.5 10*3/uL — ABNORMAL LOW (ref 0.7–4.0)
MCH: 24.1 pg — ABNORMAL LOW (ref 26.0–34.0)
MCHC: 29.3 g/dL — ABNORMAL LOW (ref 30.0–36.0)
MCV: 82.2 fL (ref 80.0–100.0)
Monocytes Absolute: 0.5 10*3/uL (ref 0.1–1.0)
Monocytes Relative: 7 %
Neutro Abs: 5.6 10*3/uL (ref 1.7–7.7)
Neutrophils Relative %: 85 %
Platelets: 208 10*3/uL (ref 150–400)
RBC: 6.97 MIL/uL — ABNORMAL HIGH (ref 3.87–5.11)
RDW: 22.2 % — ABNORMAL HIGH (ref 11.5–15.5)
WBC: 6.6 10*3/uL (ref 4.0–10.5)
nRBC: 0 % (ref 0.0–0.2)

## 2019-12-24 LAB — TRIGLYCERIDES: Triglycerides: 173 mg/dL — ABNORMAL HIGH (ref <150)

## 2019-12-24 LAB — MAGNESIUM: Magnesium: 2.3 mg/dL (ref 1.7–2.4)

## 2019-12-24 LAB — PHOSPHORUS: Phosphorus: 4.3 mg/dL (ref 2.5–4.6)

## 2019-12-24 MED ORDER — SODIUM CHLORIDE 0.9% FLUSH: 10.0000 mL | INTRAVENOUS | Status: DC | PRN

## 2019-12-24 MED ORDER — PNEUMOCOCCAL VAC POLYVALENT 25 MCG/0.5ML IJ INJ: 0.5000 mL | INJECTION | INTRAMUSCULAR | Status: DC

## 2019-12-24 MED ORDER — INSULIN ASPART 100 UNIT/ML ~~LOC~~ SOLN
0.0000 [IU] | SUBCUTANEOUS | Status: DC
  Administered 2019-12-24 (×4): 4 [IU] via SUBCUTANEOUS
  Administered 2019-12-25: 3 [IU] via SUBCUTANEOUS
  Administered 2019-12-25: 4 [IU] via SUBCUTANEOUS
  Administered 2019-12-25 (×3): 3 [IU] via SUBCUTANEOUS
  Administered 2019-12-25 (×2): 4 [IU] via SUBCUTANEOUS
  Administered 2019-12-26: 7 [IU] via SUBCUTANEOUS
  Administered 2019-12-26 (×3): 4 [IU] via SUBCUTANEOUS
  Administered 2019-12-26 – 2019-12-27 (×2): 3 [IU] via SUBCUTANEOUS
  Administered 2019-12-27: 4 [IU] via SUBCUTANEOUS
  Administered 2019-12-27 (×2): 3 [IU] via SUBCUTANEOUS
  Administered 2019-12-27 – 2019-12-28 (×3): 4 [IU] via SUBCUTANEOUS
  Administered 2019-12-28: 3 [IU] via SUBCUTANEOUS
  Administered 2019-12-28: 4 [IU] via SUBCUTANEOUS
  Administered 2019-12-28: 3 [IU] via SUBCUTANEOUS
  Administered 2019-12-29: 4 [IU] via SUBCUTANEOUS
  Administered 2019-12-29: 3 [IU] via SUBCUTANEOUS
  Administered 2019-12-29: 4 [IU] via SUBCUTANEOUS
  Administered 2019-12-29: 3 [IU] via SUBCUTANEOUS
  Administered 2019-12-30 (×2): 4 [IU] via SUBCUTANEOUS
  Administered 2019-12-30: 3 [IU] via SUBCUTANEOUS
  Filled 2019-12-24 (×33): qty 1

## 2019-12-24 MED ORDER — METHYLPREDNISOLONE SODIUM SUCC 40 MG IJ SOLR
40.0000 mg | INTRAMUSCULAR | Status: AC
  Filled 2019-12-24: qty 1

## 2019-12-24 MED ORDER — METHYLPREDNISOLONE SODIUM SUCC 40 MG IJ SOLR
40.0000 mg | Freq: Four times a day (QID) | INTRAMUSCULAR | Status: AC
  Administered 2019-12-24 – 2019-12-26 (×10): 40 mg via INTRAVENOUS
  Filled 2019-12-24 (×10): qty 1

## 2019-12-24 MED ORDER — METHYLPREDNISOLONE SODIUM SUCC 125 MG IJ SOLR
INTRAMUSCULAR | Status: AC
  Administered 2019-12-24: 40 mg
  Filled 2019-12-24: qty 2

## 2019-12-24 MED ORDER — SODIUM CHLORIDE 0.9% FLUSH
10.0000 mL | Freq: Two times a day (BID) | INTRAVENOUS | Status: DC
  Administered 2019-12-24 – 2020-01-01 (×15): 10 mL

## 2019-12-24 MED ORDER — IPRATROPIUM-ALBUTEROL 0.5-2.5 (3) MG/3ML IN SOLN: 3.0000 mL | Freq: Four times a day (QID) | RESPIRATORY_TRACT | Status: DC | PRN

## 2019-12-24 NOTE — Consult Note (Signed)
PHARMACY CONSULT NOTE   Pharmacy Consult for Electrolyte Monitoring and Replacement   Recent Labs: Potassium (mmol/L)  Date Value  12/24/2019 4.9  03/26/2014 3.9   Magnesium (mg/dL)  Date Value  61/84/8592 2.3  03/20/2012 1.5 (L)   Calcium (mg/dL)  Date Value  76/39/4320 8.9   Calcium, Total (mg/dL)  Date Value  03/79/4446 8.5   Albumin (g/dL)  Date Value  19/05/2222 3.1 (L)  03/26/2014 3.2 (L)   Phosphorus (mg/dL)  Date Value  11/46/4314 4.3   Sodium (mmol/L)  Date Value  12/24/2019 138  03/26/2014 142   Corrected Ca: 9.6 mg/dL  Assessment: Ms. Huebert Is 54 y.o female with PMH significant for has a past medical history of Anxiety, COPD, HTN, morbid obesity, and tobacco abuse presented with progressive shortness of breath and fatigue x3 days, found to be in acute hypercarbic and hypoxic respiratory failure; failed BiPAP and had to be emergently intubated; failed SBT this morning   Goal of Therapy (while in ICU):  Potassium 4.0 - 5.1 mmol/L Magnesium 2.0 - 2.4 mg/dL All Other Electrolytes WNL  Plan:   No electrolyte replacement warranted today  Re-check electrolytes in AM 8/12  [name] 12/24/2019 7:37 AM

## 2019-12-24 NOTE — Progress Notes (Signed)
Patient became agitated during the PICC line placement, increased diprivan for patient comfort.

## 2019-12-24 NOTE — Progress Notes (Addendum)
Name: [name] MRN: [id number] DOB: 11/28/1965    ADMISSION DATE:  12/20/2019 CONSULTATION DATE:  12/20/2019  REFERRING MD :  Dr. Ellender Hose  CHIEF COMPLAINT:  Acute Respiratory Distress  BRIEF PATIENT DESCRIPTION:  54 year old female with a history of COPD on home O2 at 4 L nasal cannula presented with progressive shortness of breath and fatigue x3 days, found to be in acute hypercarbic and hypoxic respiratory failure due to Pneumonia & COPD Exacerbation. Failed BiPAP and had to be emergently intubated  SIGNIFICANT EVENTS  8/8: Admitted to the ICU with acute hypoxic and hypercarbic respiratory failure on the ventilator 8/11: Failed SBT due to Hypoxia (sats 82-86%) and diffuse wheezing  STUDIES:  8/7: CXR>>Bilateral airspace opacities which could be due to pulmonary edema and/or infectious etiology. Small bilateral pleural effusion. 8/8: Venous US Left Lower Extremity>>Negative for DVT 8/10: CXR>>1. Stable cardiomegaly and mild edema compatible with congestive heart failure. 2. Bibasilar airspace disease and effusions likely reflects Atelectasis.  CULTURES: SARS-CoV-2 PCR 8/7>> negative SARS-CoV-2 PCR 8/8>> negative SARS-CoV-2 PCR 8/9>> negative HIV screen 8/8>> negative MRSA PCR 8/7>> negative Blood culture x2 8/8>> No growth to date  ANTIBIOTICS: Azithromycin 8/8>> Ceftriaxone 8/8>>  HISTORY OF PRESENT ILLNESS:   This is a 54 year old female with a medical history as indicated below, on home O2, previous tracheostomy with prolonged vent support and failure to wean, who presented to the ED via EMS with complaints of shortness of breath that had progressed for over 3 days.  History is obtained from ED records and from her significant other who is at bedside.  Patient was exposed to her daughter who tested positive for Covid 3 days ago and then started having symptoms.  When EMS arrived, patient's SPO2 was between 50 and 58% on 4 L nasal cannula.  She was placed on 10 L and  transferred to the ED.  Her ED work-up was significant for diffuse lung opacities on chest x-ray severe hypercarbia, acidosis and hypoxemia on ABG, and mild hypokalemia.  She was placed on BiPAP and subsequently failed requiring emergent intubation.  Of note, in 2019, patient had a prolonged hospital stay and vent support with subsequent tracheostomy.  Per her daughter who is her POA, patient would prefer to be trached sooner than later should she require ventilator support for more than 1 week this time.  PAST MEDICAL HISTORY :   has a past medical history of Anxiety, COPD (chronic obstructive pulmonary disease) ([place]), Essential hypertension, History of echocardiogram, Morbid obesity ([place]), and Tobacco abuse.  has a past surgical history that includes Cholecystectomy; Abdominal hysterectomy; Hernia repair; Extubation (endotracheal) in or (N/A, 08/14/2017); and TEE without cardioversion (N/A, 09/20/2017). Prior to Admission medications   Medication Sig Start Date End Date Taking? Authorizing Provider  ADVAIR HFA 115-21 MCG/ACT inhaler Inhale 2 puffs into the lungs 2 (two) times daily. 11/21/19  Yes [provider]  albuterol (PROVENTIL HFA;VENTOLIN HFA) 108 (90 Base) MCG/ACT inhaler Inhale 2 puffs into the lungs every 6 (six) hours as needed for wheezing or shortness of breath. 06/12/17  Yes Gouru, Aruna, MD  clonazePAM (KLONOPIN) 0.5 MG tablet Take 0.5 mg by mouth 3 (three) times daily as needed for anxiety.    Yes [provider]  furosemide (LASIX) 40 MG tablet Take 40 mg by mouth 2 (two) times daily. 11/21/19  Yes [provider]  ipratropium-albuterol (DUONEB) 0.5-2.5 (3) MG/3ML SOLN Take 3 mLs by nebulization every 6 (six) hours. Patient taking differently: Take 3 mLs by nebulization  2 (two) times daily.  08/17/17  Yes Epifanio Lesches, MD  ipratropium-albuterol (DUONEB) 0.5-2.5 (3) MG/3ML SOLN Inhale 3 mLs into the lungs every 8 (eight) hours as needed. 11/21/19  Yes [provider]  omeprazole (PRILOSEC) 20 MG capsule Take 20 mg by mouth daily.   Yes [provider]  SPIRIVA HANDIHALER 18 MCG inhalation capsule Place 1 capsule into inhaler and inhale daily. 11/21/19  Yes [provider]  cyclobenzaprine (FLEXERIL) 10 MG tablet Take 10 mg by mouth 3 (three) times daily as needed for muscle spasms.  Patient not taking: Reported on 12/21/2019 08/16/16   [provider]  fluticasone (FLOVENT HFA) 44 MCG/ACT inhaler Inhale 2 puffs into the lungs daily. Patient not taking: Reported on 12/21/2019    [provider]  magic mouthwash w/lidocaine SOLN Take 5 mLs by mouth 4 (four) times daily. Patient not taking: Reported on 12/21/2019 11/16/17   Cuthriell, Charline Bills, PA-C   No Known Allergies  FAMILY HISTORY:  family history includes Dementia in her mother; Heart attack in her father. SOCIAL HISTORY:  reports that she has been smoking cigarettes. She has a 30.00 pack-year smoking history. She has never used smokeless tobacco. She reports that she does not drink alcohol and does not use drugs.   COVID-19 DISASTER DECLARATION:  FULL CONTACT PHYSICAL EXAMINATION WAS NOT POSSIBLE DUE TO TREATMENT OF COVID-19 AND  CONSERVATION OF PERSONAL PROTECTIVE EQUIPMENT, LIMITED EXAM FINDINGS INCLUDE-  Patient assessed or the symptoms described in the history of present illness.  In the context of the Global COVID-19 pandemic, which necessitated consideration that the patient might be at risk for infection with the SARS-CoV-2 virus that causes COVID-19, Institutional protocols and algorithms that pertain to the evaluation of patients at risk for COVID-19 are in a state of rapid change based on information released by regulatory bodies including the CDC and federal and state organizations. These policies and algorithms were followed during the patient's care while in hospital.  REVIEW OF SYSTEMS:   Unable to assess due to intubation  SUBJECTIVE:    Unable to assess due to intubation SBT underway, pt is awake and able to follow simple commands, slightly anxious  VITAL SIGNS: Temp:  [98.1 F (36.7 C)-99.1 F (37.3 C)] 98.4 F (36.9 C) (08/11 0700) Pulse Rate:  [50-108] 53 (08/11 0700) Resp:  [16-18] 16 (08/11 0700) BP: (94-121)/(59-88) 113/77 (08/11 0700) SpO2:  [64 %-100 %] 93 % (08/11 0700) FiO2 (%):  [40 %-60 %] 40 % (08/11 0322) Weight:  [120.4 kg] 120.4 kg (08/11 0500)  PHYSICAL EXAMINATION: General:  Acutely ill appearing female, sitting in bed, awake with SBT in progress, with mild anxiety Neuro:  Awake, follows commands, nods to questions appropriately, pupils PERRLA HEENT:  Atraumatic, normocephalic, neck supple, no JVD, Cardiovascular:  Tachycardia, regular rhythm, s1s2, no M/R/G, 2+ pulses Lungs:  Coarse breath sounds with expiratory wheezing throughout, tachypnea, vent assisted, even Abdomen:  Obese, soft, nontender, nondistended, no guarding or rebound tenderness, BS+ x4 Musculoskeletal:  Normal bulk and tone, no deformities, no edema Skin:  Warm and dry.  No obvious rashes, lesions, or ulcerations  Recent Labs  Lab 12/22/19 0528 12/23/19 0354 12/24/19 0410  NA 140 138 138  K 4.6 4.7 4.9  CL 97* 96* 98  CO2 '31 31 30  ' BUN 21* 19 19  CREATININE 0.86 0.66 0.67  GLUCOSE 160* 145* 151*   Recent Labs  Lab 12/22/19 0528 12/23/19 0354 12/24/19 0410  HGB 16.5* 16.7* 16.8*  HCT  57.7* 56.5* 57.3*  WBC 8.2 7.0 6.6  PLT 214 198 208   DG Chest Port 1 View  Result Date: 12/23/2019 CLINICAL DATA:  Pneumonia. EXAM: PORTABLE CHEST 1 VIEW COMPARISON:  One-view chest x-ray 12/21/2019 FINDINGS: Endotracheal tube is stable and in satisfactory position. Enteric tube courses off the inferior border of the film. The heart is enlarged. Mild edema is present. Bibasilar airspace opacities and effusions are present. Overall aeration is stable. IMPRESSION: 1. Stable cardiomegaly and mild edema compatible with congestive heart  failure. 2. Bibasilar airspace disease and effusions likely reflects atelectasis. Electronically Signed   By: San Morelle M.D.   On: 12/23/2019 05:55    ASSESSMENT / PLAN:  Acute on Chronic Hypoxic Hypercapnic Respiratory Failure in setting of Acute COPD Exacerbation and Pneumonia Hx: COPD on 4 L/min home O2 nasal cannula -Full vent support -Wean FiO2 & PEEP as tolerated to maintain O2 sats 88 to 92% -Follow intermittent CXR & ABG -VAP protocol -Daily Spontaneous breathing trials when respiratory parameters met -Failed SBT 12/24/19 due to Hypoxia & diffuse expiratory wheezing -Scheduled Bronchodilators -Inhaled Steroids -Solu-medrol (dose increased 12/24/19 to 40 mg q6h due to diffuse wheezing) -Antibiotics as above   Pneumonia -Monitor fever curve -Trend WBC's & Procalcitonin -Follow cultures as above -Continue Azithromycin & Ceftriaxone -Has tested negative for COVID-19 x3   Hyperglycemia -CBG's -SSI -Follow ICU Hypo/hyperglycemia protocol   Hypertriglyceridemia due to Propofol -Follow triglycerides while on propofol          BEST PRACTICES: DISPOSITION: ICU GOALS OF CARE: Full Code VTE PROPHYLAXIS: SQ Heparin STRESS ULCER PROPHYLAXIS:  IV Pepcid UPDATES: Updated pt's daughter Roselyn Reef via telephone 12/24/19.  All questions answered.  Darel Hong, AGACNP-BC [facility] Pager: [phone]  12/24/2019, 8:38 AM  Critical care time 35 minutes

## 2019-12-24 NOTE — Progress Notes (Signed)
Peripherally Inserted Central Catheter Placement  The IV Nurse has discussed with the patient and/or persons authorized to consent for the patient, the purpose of this procedure and the potential benefits and risks involved with this procedure.  The benefits include less needle sticks, lab draws from the catheter, and the patient may be discharged home with the catheter. Risks include, but not limited to, infection, bleeding, blood clot (thrombus formation), and puncture of an artery; nerve damage and irregular heartbeat and possibility to perform a PICC exchange if needed/ordered by physician.  Alternatives to this procedure were also discussed.  Bard Power PICC patient education guide, fact sheet on infection prevention and patient information card has been provided to patient /or left at bedside.    PICC Placement Documentation  PICC Double Lumen 12/17/19 PICC Right Brachial 41 cm 0 cm (Active)  Indication for Insertion or Continuance of Line Prolonged intravenous therapies 12/24/19 1702  Exposed Catheter (cm) 0 cm 12/24/19 1702  Site Assessment Clean;Intact;Dry 12/24/19 1702  Lumen #1 Status Flushed;Blood return noted;Saline locked 12/24/19 1702  Lumen #2 Status Flushed;Blood return noted;Saline locked 12/24/19 1702  Dressing Type Transparent 12/24/19 1702  Dressing Status Clean;Dry;Intact;Antimicrobial disc in place 12/24/19 1702  Dressing Change Due 12/31/19 12/24/19 1702   Daughter gave consent via phone    Audrie Gallus 12/24/2019, 5:05 PM

## 2019-12-25 ENCOUNTER — Inpatient Hospital Stay: Payer: Medicaid Other

## 2019-12-25 DIAGNOSIS — J9602 Acute respiratory failure with hypercapnia: Secondary | ICD-10-CM | POA: Diagnosis not present

## 2019-12-25 DIAGNOSIS — J9601 Acute respiratory failure with hypoxia: Secondary | ICD-10-CM | POA: Diagnosis not present

## 2019-12-25 LAB — BASIC METABOLIC PANEL WITH GFR
Anion gap: 11 (ref 5–15)
BUN: 23 mg/dL — ABNORMAL HIGH (ref 6–20)
CO2: 32 mmol/L (ref 22–32)
Calcium: 8.8 mg/dL — ABNORMAL LOW (ref 8.9–10.3)
Chloride: 95 mmol/L — ABNORMAL LOW (ref 98–111)
Creatinine, Ser: 0.47 mg/dL (ref 0.44–1.00)
GFR calc Af Amer: >60 mL/min (ref >60)
GFR calc non Af Amer: >60 mL/min (ref >60)
Glucose, Bld: 134 mg/dL — ABNORMAL HIGH (ref 70–99)
Potassium: 5.2 mmol/L — ABNORMAL HIGH (ref 3.5–5.1)
Sodium: 138 mmol/L (ref 135–145)

## 2019-12-25 LAB — GLUCOSE, CAPILLARY
Glucose-Capillary: 140 mg/dL — ABNORMAL HIGH (ref 70–99)
Glucose-Capillary: 113 mg/dL — ABNORMAL HIGH (ref 70–99)
Glucose-Capillary: 153 mg/dL — ABNORMAL HIGH (ref 70–99)
Glucose-Capillary: 158 mg/dL — ABNORMAL HIGH (ref 70–99)
Glucose-Capillary: 163 mg/dL — ABNORMAL HIGH (ref 70–99)
Glucose-Capillary: 168 mg/dL — ABNORMAL HIGH (ref 70–99)
Glucose-Capillary: 169 mg/dL — ABNORMAL HIGH (ref 70–99)
Glucose-Capillary: 169 mg/dL — ABNORMAL HIGH (ref 70–99)
Glucose-Capillary: 126 mg/dL — ABNORMAL HIGH (ref 70–99)
Glucose-Capillary: 129 mg/dL — ABNORMAL HIGH (ref 70–99)
Glucose-Capillary: 168 mg/dL — ABNORMAL HIGH (ref 70–99)
Glucose-Capillary: 171 mg/dL — ABNORMAL HIGH (ref 70–99)

## 2019-12-25 LAB — BLOOD GAS, ARTERIAL
Acid-Base Excess: 11 mmol/L — ABNORMAL HIGH (ref 0.0–2.0)
Bicarbonate: 41.7 mmol/L — ABNORMAL HIGH (ref 20.0–28.0)
FIO2: 0.35
O2 Saturation: 92.2 %
PEEP: 5 cmH2O
Patient temperature: 37
Pressure support: 10 cmH2O
pCO2 arterial: 79 mmHg (ref 32.0–48.0)
pH, Arterial: 7.33 — ABNORMAL LOW (ref 7.350–7.450)
pO2, Arterial: 69 mmHg — ABNORMAL LOW (ref 83.0–108.0)

## 2019-12-25 LAB — CBC WITH DIFFERENTIAL/PLATELET
Abs Immature Granulocytes: 0.02 10*3/uL (ref 0.00–0.07)
Basophils Absolute: 0 10*3/uL (ref 0.0–0.1)
Basophils Relative: 0 %
Eosinophils Absolute: 0 10*3/uL (ref 0.0–0.5)
Eosinophils Relative: 0 %
HCT: 54.5 % — ABNORMAL HIGH (ref 36.0–46.0)
Hemoglobin: 16 g/dL — ABNORMAL HIGH (ref 12.0–15.0)
Immature Granulocytes: 0 %
Lymphocytes Relative: 6 %
Lymphs Abs: 0.4 10*3/uL — ABNORMAL LOW (ref 0.7–4.0)
MCH: 24.1 pg — ABNORMAL LOW (ref 26.0–34.0)
MCHC: 29.4 g/dL — ABNORMAL LOW (ref 30.0–36.0)
MCV: 82.2 fL (ref 80.0–100.0)
Monocytes Absolute: 0.5 10*3/uL (ref 0.1–1.0)
Monocytes Relative: 8 %
Neutro Abs: 4.9 10*3/uL (ref 1.7–7.7)
Neutrophils Relative %: 86 %
Platelets: 186 10*3/uL (ref 150–400)
RBC: 6.63 MIL/uL — ABNORMAL HIGH (ref 3.87–5.11)
RDW: 21.5 % — ABNORMAL HIGH (ref 11.5–15.5)
Smear Review: NORMAL
WBC: 5.7 10*3/uL (ref 4.0–10.5)
nRBC: 0 % (ref 0.0–0.2)

## 2019-12-25 LAB — POTASSIUM: Potassium: 5 mmol/L (ref 3.5–5.1)

## 2019-12-25 LAB — TRIGLYCERIDES: Triglycerides: 882 mg/dL — ABNORMAL HIGH (ref <150)

## 2019-12-25 LAB — MAGNESIUM: Magnesium: 2.6 mg/dL — ABNORMAL HIGH (ref 1.7–2.4)

## 2019-12-25 LAB — PHOSPHORUS: Phosphorus: 3.8 mg/dL (ref 2.5–4.6)

## 2019-12-25 MED ORDER — PROSOURCE TF PO LIQD
45.0000 mL | Freq: Every day | ORAL | Status: DC
  Administered 2019-12-26 – 2019-12-29 (×4): 45 mL
  Filled 2019-12-25: qty 45

## 2019-12-25 MED ORDER — DEXMEDETOMIDINE HCL IN NACL 400 MCG/100ML IV SOLN
0.4000 ug/kg/h | INTRAVENOUS | Status: DC
  Administered 2019-12-25: 0.6 ug/kg/h via INTRAVENOUS
  Administered 2019-12-25: 0.4 ug/kg/h via INTRAVENOUS
  Administered 2019-12-25 – 2019-12-26 (×3): 1 ug/kg/h via INTRAVENOUS
  Filled 2019-12-25 (×5): qty 100

## 2019-12-25 MED ORDER — MIDAZOLAM 50MG/50ML (1MG/ML) PREMIX INFUSION
0.5000 mg/h | INTRAVENOUS | Status: DC
  Administered 2019-12-26: 1 mg/h via INTRAVENOUS
  Administered 2019-12-26: 0.5 mg/h via INTRAVENOUS
  Administered 2019-12-29: 1 mg/h via INTRAVENOUS
  Filled 2019-12-25 (×3): qty 50

## 2019-12-25 MED ORDER — PNEUMOCOCCAL VAC POLYVALENT 25 MCG/0.5ML IJ INJ: 0.5000 mL | INJECTION | INTRAMUSCULAR | Status: DC | PRN

## 2019-12-25 MED ORDER — FUROSEMIDE 10 MG/ML IJ SOLN
40.0000 mg | Freq: Once | INTRAMUSCULAR | Status: AC
  Administered 2019-12-25: 40 mg via INTRAVENOUS
  Filled 2019-12-25: qty 4

## 2019-12-25 MED ORDER — VITAL HIGH PROTEIN PO LIQD
1000.0000 mL | ORAL | Status: DC
  Administered 2019-12-25 – 2019-12-29 (×4): 1000 mL

## 2019-12-25 MED ORDER — ADULT MULTIVITAMIN W/MINERALS CH
1.0000 | ORAL_TABLET | Freq: Every day | ORAL | Status: DC
  Administered 2019-12-26 – 2019-12-29 (×4): 1
  Filled 2019-12-25 (×4): qty 1

## 2019-12-25 MED ORDER — FUROSEMIDE 10 MG/ML IJ SOLN: 20.0000 mg | Freq: Once | INTRAMUSCULAR | Status: DC

## 2019-12-25 NOTE — Consult Note (Signed)
Angel, [name] [id number] 09-16-1965 Angel Rising, MD  Reason for Consult: tracheostomy tube placement  HPI: 54 year old female admitted for acute on chronic respiratory failure.  Per chart review presented to ER with hypoxemia.  Previously was on 4L of oxygen as outpatient.  COVID test negative x 2.  History of difficult airway and difficult intubation.  Asked to evaluate patient for tracheostomy tube candidacy.  Allergies: No Known Allergies  ROS: Review of systems normal other than 12 systems except per HPI.  PMH:  Past Medical History:  Diagnosis Date  . Anxiety   . COPD (chronic obstructive pulmonary disease) (HCC)   . Essential hypertension   . History of echocardiogram    a. 08/2013 Echo: EF 55-60%, impaired LV relaxation, mild LVH, nl RV fxn, nl RVSP, mild TR; b. 05/2017 Echo: EF 65-70%, no rwma, nl RV fxn.  . Morbid obesity (HCC)   . Tobacco abuse     FH:  Family History  Problem Relation Age of Onset  . Dementia Mother   . Heart attack Father     SH:  Social History   Socioeconomic History  . Marital status: Single    Spouse name: Not on file  . Number of children: Not on file  . Years of education: Not on file  . Highest education level: Not on file  Occupational History  . Not on file  Tobacco Use  . Smoking status: Current Every Day Smoker    Packs/day: 0.75    Years: 40.00    Pack years: 30.00    Types: Cigarettes  . Smokeless tobacco: Never Used  Vaping Use  . Vaping Use: Every day  Substance and Sexual Activity  . Alcohol use: No  . Drug use: No  . Sexual activity: Not on file  Other Topics Concern  . Not on file  Social History Narrative   Lives in [place] with her mother, who has dementia.  She takes care of her mother and otw does not work.  She does not routinely exercise.   Social Determinants of Health   Financial Resource Strain:   . Difficulty of Paying Living Expenses:   Food Insecurity:   . Worried About Brewing technologist in the Last Year:   . Barista in the Last Year:   Transportation Needs:   . Freight forwarder (Medical):   Marland Kitchen Lack of Transportation (Non-Medical):   Physical Activity:   . Days of Exercise per Week:   . Minutes of Exercise per Session:   Stress:   . Feeling of Stress :   Social Connections:   . Frequency of Communication with Friends and Family:   . Frequency of Social Gatherings with Friends and Family:   . Attends Religious Services:   . Active Member of Clubs or Organizations:   . Attends Banker Meetings:   Marland Kitchen Marital Status:   Intimate Partner Violence:   . Fear of Current or Ex-Partner:   . Emotionally Abused:   Marland Kitchen Physically Abused:   . Sexually Abused:     PSH:  Past Surgical History:  Procedure Laterality Date  . ABDOMINAL HYSTERECTOMY    . CHOLECYSTECTOMY    . EXTUBATION (ENDOTRACHEAL) IN OR N/A 08/14/2017   Procedure: EXTUBATION (ENDOTRACHEAL) IN OR;  Surgeon: Geanie Logan, MD;  Location: [facility] ORS;  Service: ENT;  Laterality: N/A;  . HERNIA REPAIR    . TEE WITHOUT CARDIOVERSION N/A 09/20/2017   Procedure: TRANSESOPHAGEAL ECHOCARDIOGRAM (  TEE);  Surgeon: Laurier Nancy, MD;  Location: [facility] ORS;  Service: Cardiovascular;  Laterality: N/A;    Physical  Exam:  GEN-  Sedated and intubated CARD-  RR RESP-  ETT secure and patent NECK-  Normal landmarks  A/P: Acute on Chronic respiratory failure with need for long term ventilatory support  Plan:  Tracheostomy tube candidate.  Will plan on placement on Tuesday next week.  Please obtain consent from family.  I will be happy to discuss and answer any questions they have.  At midnight Monday, please hold tube feeds and hold anticoagulation.   Bud Face 12/25/2019 5:24 PM

## 2019-12-25 NOTE — Consult Note (Signed)
PHARMACY CONSULT NOTE   Pharmacy Consult for Electrolyte Monitoring and Replacement   Recent Labs: Potassium (mmol/L)  Date Value  12/25/2019 5.2 (H)  03/26/2014 3.9   Magnesium (mg/dL)  Date Value  71/21/9758 2.6 (H)  03/20/2012 1.5 (L)   Calcium (mg/dL)  Date Value  83/25/4982 8.8 (L)   Calcium, Total (mg/dL)  Date Value  64/15/8309 8.5   Albumin (g/dL)  Date Value  40/76/8088 3.1 (L)  03/26/2014 3.2 (L)   Phosphorus (mg/dL)  Date Value  03/17/1593 3.8   Sodium (mmol/L)  Date Value  12/25/2019 138  03/26/2014 142   Corrected Ca: 9.5 mg/dL  Assessment: [name] Is 54 y.o female with PMH significant for has a past medical history of Anxiety, COPD, HTN, morbid obesity, and tobacco abuse presented with progressive shortness of breath and fatigue x3 days, found to be in acute hypercarbic and hypoxic respiratory failure; failed BiPAP and had to be emergently intubated; failed SBT 8/11   Goal of Therapy (while in ICU):  Potassium 4.0 - 5.1 mmol/L Magnesium 2.0 - 2.4 mg/dL All Other Electrolytes WNL  Plan:   Potassium and magnesium are mildly elevated following no replacement  She received a single dose of 40 mg IV furosemide this morning for pulmonary edema which should normalize these levels  Re-check electrolytes in AM 8/12  [name] 12/25/2019 7:18 AM

## 2019-12-25 NOTE — Progress Notes (Addendum)
Name: [name] MRN: [id number] DOB: 05/15/1966    ADMISSION DATE:  12/20/2019 CONSULTATION DATE:  12/20/2019  REFERRING MD :  Dr. Ellender Hose  CHIEF COMPLAINT:  Acute Respiratory Distress  BRIEF PATIENT DESCRIPTION:  54 year old female with a history of COPD on home O2 at 4 L nasal cannula presented with progressive shortness of breath and fatigue x3 days, found to be in acute hypercarbic and hypoxic respiratory failure due to Pneumonia & COPD Exacerbation. Failed BiPAP and had to be emergently intubated  SIGNIFICANT EVENTS  8/8: Admitted to the ICU with acute hypoxic and hypercarbic respiratory failure on the ventilator 8/11: Failed SBT due to Hypoxia (sats 82-86%) and diffuse wheezing 8/12: Failed SBT. ENT consulted for Trach Placement per family request  STUDIES:  8/7: CXR>>Bilateral airspace opacities which could be due to pulmonary edema and/or infectious etiology. Small bilateral pleural effusion. 8/8: Venous US Left Lower Extremity>>Negative for DVT 8/10: CXR>>1. Stable cardiomegaly and mild edema compatible with congestive heart failure. 2. Bibasilar airspace disease and effusions likely reflects Atelectasis.  CULTURES: SARS-CoV-2 PCR 8/7>> negative SARS-CoV-2 PCR 8/8>> negative SARS-CoV-2 PCR 8/9>> negative HIV screen 8/8>> negative MRSA PCR 8/7>> negative Blood culture x2 8/8>> No growth to date  ANTIBIOTICS: Azithromycin 8/8>> Ceftriaxone 8/8>>  HISTORY OF PRESENT ILLNESS:   This is a 54 year old female with a medical history as indicated below, on home O2, previous tracheostomy with prolonged vent support and failure to wean, who presented to the ED via EMS with complaints of shortness of breath that had progressed for over 3 days.  History is obtained from ED records and from her significant other who is at bedside.  Patient was exposed to her daughter who tested positive for Covid 3 days ago and then started having symptoms.  When EMS arrived, patient's SPO2 was  between 50 and 58% on 4 L nasal cannula.  She was placed on 10 L and transferred to the ED.  Her ED work-up was significant for diffuse lung opacities on chest x-ray severe hypercarbia, acidosis and hypoxemia on ABG, and mild hypokalemia.  She was placed on BiPAP and subsequently failed requiring emergent intubation.  Of note, in 2019, patient had a prolonged hospital stay and vent support with subsequent tracheostomy.  Per her daughter who is her POA, patient would prefer to be trached sooner than later should she require ventilator support for more than 1 week this time.  PAST MEDICAL HISTORY :   has a past medical history of Anxiety, COPD (chronic obstructive pulmonary disease) ([place]), Essential hypertension, History of echocardiogram, Morbid obesity ([place]), and Tobacco abuse.  has a past surgical history that includes Cholecystectomy; Abdominal hysterectomy; Hernia repair; Extubation (endotracheal) in or (N/A, 08/14/2017); and TEE without cardioversion (N/A, 09/20/2017). Prior to Admission medications   Medication Sig Start Date End Date Taking? Authorizing Provider  ADVAIR HFA 115-21 MCG/ACT inhaler Inhale 2 puffs into the lungs 2 (two) times daily. 11/21/19  Yes [provider]  albuterol (PROVENTIL HFA;VENTOLIN HFA) 108 (90 Base) MCG/ACT inhaler Inhale 2 puffs into the lungs every 6 (six) hours as needed for wheezing or shortness of breath. 06/12/17  Yes Gouru, Aruna, MD  clonazePAM (KLONOPIN) 0.5 MG tablet Take 0.5 mg by mouth 3 (three) times daily as needed for anxiety.    Yes [provider]  furosemide (LASIX) 40 MG tablet Take 40 mg by mouth 2 (two) times daily. 11/21/19  Yes [provider]  ipratropium-albuterol (DUONEB) 0.5-2.5 (3) MG/3ML SOLN Take 3 mLs by nebulization every  6 (six) hours. Patient taking differently: Take 3 mLs by nebulization 2 (two) times daily.  08/17/17  Yes Epifanio Lesches, MD  ipratropium-albuterol (DUONEB) 0.5-2.5 (3) MG/3ML SOLN Inhale 3 mLs  into the lungs every 8 (eight) hours as needed. 11/21/19  Yes [provider]  omeprazole (PRILOSEC) 20 MG capsule Take 20 mg by mouth daily.   Yes [provider]  SPIRIVA HANDIHALER 18 MCG inhalation capsule Place 1 capsule into inhaler and inhale daily. 11/21/19  Yes [provider]  cyclobenzaprine (FLEXERIL) 10 MG tablet Take 10 mg by mouth 3 (three) times daily as needed for muscle spasms.  Patient not taking: Reported on 12/21/2019 08/16/16   [provider]  fluticasone (FLOVENT HFA) 44 MCG/ACT inhaler Inhale 2 puffs into the lungs daily. Patient not taking: Reported on 12/21/2019    [provider]  magic mouthwash w/lidocaine SOLN Take 5 mLs by mouth 4 (four) times daily. Patient not taking: Reported on 12/21/2019 11/16/17   Cuthriell, Charline Bills, PA-C   No Known Allergies  FAMILY HISTORY:  family history includes Dementia in her mother; Heart attack in her father. SOCIAL HISTORY:  reports that she has been smoking cigarettes. She has a 30.00 pack-year smoking history. She has never used smokeless tobacco. She reports that she does not drink alcohol and does not use drugs.   COVID-19 DISASTER DECLARATION:  FULL CONTACT PHYSICAL EXAMINATION WAS NOT POSSIBLE DUE TO TREATMENT OF COVID-19 AND  CONSERVATION OF PERSONAL PROTECTIVE EQUIPMENT, LIMITED EXAM FINDINGS INCLUDE-  Patient assessed or the symptoms described in the history of present illness.  In the context of the Global COVID-19 pandemic, which necessitated consideration that the patient might be at risk for infection with the SARS-CoV-2 virus that causes COVID-19, Institutional protocols and algorithms that pertain to the evaluation of patients at risk for COVID-19 are in a state of rapid change based on information released by regulatory bodies including the CDC and federal and state organizations. These policies and algorithms were followed during the patient's care while in hospital.  REVIEW  OF SYSTEMS:   Unable to assess due to intubation  SUBJECTIVE:  Unable to assess due to intubation Pt on 35% FiO2, 8 PEEP  VITAL SIGNS: Temp:  [97.7 F (36.5 C)-98.2 F (36.8 C)] 98.2 F (36.8 C) (08/12 0800) Pulse Rate:  [45-73] 55 (08/12 0800) Resp:  [15-16] 16 (08/12 0800) BP: (88-140)/(60-79) 111/72 (08/12 0800) SpO2:  [91 %-95 %] 92 % (08/12 0800) FiO2 (%):  [35 %] 35 % (08/12 0115) Weight:  [121.4 kg] 121.4 kg (08/12 0459)  PHYSICAL EXAMINATION: General: Acutely ill-appearing female, laying in bed, lightly sedated, no acute distress  Neuro: Arouses to voice, follows commands, no focal deficits, nods head to questions  HEENT: Atraumatic, normocephalic, neck supple, no JVD, ET tube in place Cardiovascular: Bradycardia, regular rhythm, S1-S2, no murmurs, rubs, gallops, 2+ pulses  Lungs: Fine crackles, no expiratory wheezing, vent assisted, even  Abdomen: Obese, soft, nontender, nondistended, no guarding or rebound tenderness, bowel sounds positive x4  Musculoskeletal: Normal bulk and tone, no deformities, no edema Skin: Warm and dry.  No obvious rashes, lesions, ulcerations   Recent Labs  Lab 12/23/19 0354 12/24/19 0410 12/25/19 0255  NA 138 138 138  K 4.7 4.9 5.2*  CL 96* 98 95*  CO2 31 30 32  BUN 19 19 23*  CREATININE 0.66 0.67 0.47  GLUCOSE 145* 151* 134*   Recent Labs  Lab 12/23/19 0354 12/24/19 0410 12/25/19 0255  HGB 16.7* 16.8*  16.0*  HCT 56.5* 57.3* 54.5*  WBC 7.0 6.6 5.7  PLT 198 208 186   DG Chest Port 1 View  Result Date: 12/25/2019 CLINICAL DATA:  Hypoxia EXAM: PORTABLE CHEST 1 VIEW COMPARISON:  December 23, 2019 FINDINGS: Endotracheal tube tip is 4.3 cm above the carina. Nasogastric tube tip and side port are below the diaphragm. There is no appreciable pneumothorax. There is cardiomegaly with pulmonary venous hypertension. There are pleural effusions bilaterally. There is mild interstitial edema. There is no appreciable airspace consolidation. No  adenopathy. No bone lesions. IMPRESSION: Tube positions as described without pneumothorax. Cardiomegaly with pulmonary vascular congestion. Pleural effusions bilaterally with interstitial edema. Overall appearance indicative of congestive heart failure. Electronically Signed   By: Lowella Grip III M.D.   On: 12/25/2019 08:02   [place] EKG SITE RITE  Result Date: 12/24/2019 If Site Rite image not attached, placement could not be confirmed due to current cardiac rhythm.   ASSESSMENT / PLAN:  Acute on Chronic Hypoxic Hypercapnic Respiratory Failure in setting of Acute COPD Exacerbation and Pneumonia Hx: COPD on 4 L/min home O2 nasal cannula -Full vent support -Wean FiO2 and PEEP as tolerated to maintain O2 saturations 88 to 92% -Follow intermittent chest x-ray and ABG -VAP protocol -Daily spontaneous breathing trials when respiratory parameters met -Failed SBT 12/25/2019 -Scheduled Bronchodilators -Inhaled steroids -Solu-Medrol (dose increased 12/24/2019 to 40 mg q6h) -Antibiotics as above -Noted to have pulmonary edema on chest x-ray 8/12/1, will give 40 mg IV Lasix x1 dose -Patient's family has requested ENT consult for tracheostomy placement following several failed weans due to previous prolonged and traumatic endotracheal intubation.  Have contacted Dr. Pryor Ochoa who will see the patient.  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

## 2019-12-25 NOTE — Plan of Care (Signed)
Pt on vent, total care , unable to assess orientation , sedated; Will continue to monitor closely.

## 2019-12-25 NOTE — Progress Notes (Signed)
Nutrition Follow-up  DOCUMENTATION CODES:   Morbid obesity  INTERVENTION:  Initiate new goal TF regimen of Vital High Protein at 50 mL/hr (1200 mL goal daily volume) + PROSource 45 mL once daily per tube. Provides 1240 kcal, 116 grams of protein, 1008 mL H2O daily.  Provide MVI daily per tube.  NUTRITION DIAGNOSIS:   Inadequate oral intake related to inability to eat (pt sedated and ventilated) as evidenced by NPO status.  Ongoing.  GOAL:   Provide needs based on ASPEN/SCCM guidelines  Met with TF regimen.  MONITOR:   Vent status, Labs, Weight trends, Skin, I & O's  REASON FOR ASSESSMENT:   Consult Assessment of nutrition requirement/status  ASSESSMENT:   54 year old female with a history of CHF, COPD on home O2 at 4 L nasal cannula who presents with progressive shortness of breath and fatigue x3 days, found to be in acute hypercarbic and hypoxic respiratory failure; failed BiPAP and had to be emergently intubated  8/8 intubated  Patient is currently intubated on ventilator support MV: 8 L/min Temp (24hrs), Avg:98.1 F (36.7 C), Min:97.7 F (36.5 C), Max:98.8 F (37.1 C)  Medications reviewed and include: Colace 100 mg BID, Novolog 0-20 units Q4hrs, Solu-medrol 40 mg Q6hrs IV, nicotine patch, Miralax, ceftriaxone, Precedex gtt, famotidine, fentanyl gtt.  Labs reviewed: CBG 126-158, Potassium 5.2, Chloride 95, BUN 23, Magnesium 2.6, Triglycerides 882.  I/O: 2000 mL UOP yesterday (0.7 mL/kg/hr)  Weight trend: 121.4 kg on 8/12; wt remaining fairly stable from 8/9  Enteral Access: 14 Fr. OGT placed 8/8; terminates in the stomach per abdominal x-ray 8/8  TF regimen: Vital High Protein at 20 mL/hr + PROSource TF 45 mL QID  Discussed on rounds. Propofol being discontinued today.  Diet Order:   Diet Order            Diet NPO time specified  Diet effective now                EDUCATION NEEDS:   Not appropriate for education at this time  Skin:  Skin  Assessment: Skin Integrity Issues: (MASD to groin)  Last BM:  Unknown  Height:   Ht Readings from Last 1 Encounters:  12/20/19 5' (1.524 m)   Weight:   Wt Readings from Last 1 Encounters:  12/25/19 121.4 kg   Ideal Body Weight:  45.45 kg  BMI:  Body mass index is 52.27 kg/m.  Estimated Nutritional Needs:   Kcal:  1000-1200kcal/day  Protein:  114g/day  Fluid:  1.4-1.6L/day  Jacklynn Barnacle, MS, RD, LDN Pager number available on Amion

## 2019-12-26 ENCOUNTER — Inpatient Hospital Stay: Payer: Medicaid Other

## 2019-12-26 DIAGNOSIS — J9602 Acute respiratory failure with hypercapnia: Secondary | ICD-10-CM | POA: Diagnosis not present

## 2019-12-26 DIAGNOSIS — J9601 Acute respiratory failure with hypoxia: Secondary | ICD-10-CM | POA: Diagnosis not present

## 2019-12-26 DIAGNOSIS — R569 Unspecified convulsions: Secondary | ICD-10-CM

## 2019-12-26 LAB — CBC WITH DIFFERENTIAL/PLATELET
Abs Immature Granulocytes: 0.03 10*3/uL (ref 0.00–0.07)
Basophils Absolute: 0 10*3/uL (ref 0.0–0.1)
Basophils Relative: 0 %
Eosinophils Absolute: 0 10*3/uL (ref 0.0–0.5)
Eosinophils Relative: 0 %
HCT: 57.8 % — ABNORMAL HIGH (ref 36.0–46.0)
Hemoglobin: 17.5 g/dL — ABNORMAL HIGH (ref 12.0–15.0)
Immature Granulocytes: 0 %
Lymphocytes Relative: 5 %
Lymphs Abs: 0.3 10*3/uL — ABNORMAL LOW (ref 0.7–4.0)
MCH: 24.2 pg — ABNORMAL LOW (ref 26.0–34.0)
MCHC: 30.3 g/dL (ref 30.0–36.0)
MCV: 79.8 fL — ABNORMAL LOW (ref 80.0–100.0)
Monocytes Absolute: 0.6 10*3/uL (ref 0.1–1.0)
Monocytes Relative: 9 %
Neutro Abs: 6.2 10*3/uL (ref 1.7–7.7)
Neutrophils Relative %: 86 %
Platelets: 165 10*3/uL (ref 150–400)
RBC: 7.24 MIL/uL — ABNORMAL HIGH (ref 3.87–5.11)
RDW: 21.3 % — ABNORMAL HIGH (ref 11.5–15.5)
Smear Review: NORMAL
WBC: 7.2 10*3/uL (ref 4.0–10.5)
nRBC: 0 % (ref 0.0–0.2)

## 2019-12-26 LAB — PHOSPHORUS: Phosphorus: 3 mg/dL (ref 2.5–4.6)

## 2019-12-26 LAB — GLUCOSE, CAPILLARY
Glucose-Capillary: 142 mg/dL — ABNORMAL HIGH (ref 70–99)
Glucose-Capillary: 171 mg/dL — ABNORMAL HIGH (ref 70–99)
Glucose-Capillary: 171 mg/dL — ABNORMAL HIGH (ref 70–99)
Glucose-Capillary: 177 mg/dL — ABNORMAL HIGH (ref 70–99)
Glucose-Capillary: 200 mg/dL — ABNORMAL HIGH (ref 70–99)
Glucose-Capillary: 216 mg/dL — ABNORMAL HIGH (ref 70–99)

## 2019-12-26 LAB — MAGNESIUM: Magnesium: 1.7 mg/dL (ref 1.7–2.4)

## 2019-12-26 LAB — CULTURE, BLOOD (ROUTINE X 2)
Culture: NO GROWTH
Culture: NO GROWTH
Special Requests: ADEQUATE

## 2019-12-26 LAB — BASIC METABOLIC PANEL WITH GFR
Anion gap: 12 (ref 5–15)
BUN: 29 mg/dL — ABNORMAL HIGH (ref 6–20)
CO2: 28 mmol/L (ref 22–32)
Calcium: 7.9 mg/dL — ABNORMAL LOW (ref 8.9–10.3)
Chloride: 99 mmol/L (ref 98–111)
Creatinine, Ser: 0.54 mg/dL (ref 0.44–1.00)
GFR calc Af Amer: >60 mL/min
GFR calc non Af Amer: >60 mL/min
Glucose, Bld: 163 mg/dL — ABNORMAL HIGH (ref 70–99)
Potassium: 3.6 mmol/L (ref 3.5–5.1)
Sodium: 139 mmol/L (ref 135–145)

## 2019-12-26 LAB — TROPONIN I (HIGH SENSITIVITY): Troponin I (High Sensitivity): 11 ng/L (ref <18)

## 2019-12-26 LAB — TRIGLYCERIDES: Triglycerides: 112 mg/dL (ref <150)

## 2019-12-26 MED ORDER — MAGNESIUM SULFATE 2 GM/50ML IV SOLN
2.0000 g | Freq: Once | INTRAVENOUS | Status: AC
  Administered 2019-12-26: 2 g via INTRAVENOUS
  Filled 2019-12-26: qty 50

## 2019-12-26 MED ORDER — METHYLPREDNISOLONE SODIUM SUCC 40 MG IJ SOLR
40.0000 mg | Freq: Two times a day (BID) | INTRAMUSCULAR | Status: DC
  Administered 2019-12-28 – 2019-12-30 (×5): 40 mg via INTRAVENOUS
  Filled 2019-12-26 (×5): qty 1

## 2019-12-26 MED ORDER — LEVETIRACETAM IN NACL 1000 MG/100ML IV SOLN
1000.0000 mg | INTRAVENOUS | Status: AC
  Administered 2019-12-26: 1000 mg via INTRAVENOUS
  Filled 2019-12-26: qty 100

## 2019-12-26 MED ORDER — METHYLPREDNISOLONE SODIUM SUCC 40 MG IJ SOLR
40.0000 mg | Freq: Three times a day (TID) | INTRAMUSCULAR | Status: AC
  Administered 2019-12-27: 40 mg via INTRAVENOUS
  Filled 2019-12-26: qty 1

## 2019-12-26 MED ORDER — LORAZEPAM 2 MG/ML IJ SOLN
INTRAMUSCULAR | Status: AC
  Filled 2019-12-26: qty 1

## 2019-12-26 MED ORDER — LORAZEPAM 2 MG/ML IJ SOLN
2.0000 mg | INTRAMUSCULAR | Status: AC
  Administered 2019-12-26: 2 mg via INTRAVENOUS

## 2019-12-26 MED ORDER — LEVETIRACETAM IN NACL 500 MG/100ML IV SOLN
500.0000 mg | Freq: Two times a day (BID) | INTRAVENOUS | Status: DC
  Administered 2019-12-26 – 2019-12-28 (×4): 500 mg via INTRAVENOUS
  Filled 2019-12-26 (×6): qty 100

## 2019-12-26 MED ORDER — POTASSIUM CHLORIDE 10 MEQ/50ML IV SOLN
10.0000 meq | INTRAVENOUS | Status: AC
  Administered 2019-12-26 (×4): 10 meq via INTRAVENOUS
  Filled 2019-12-26 (×10): qty 50

## 2019-12-26 NOTE — Progress Notes (Signed)
Name: [name] MRN: [id number] DOB: 1965-06-24     CONSULTATION DATE: 12/20/2019 Subjective and objective: Afebrile remains on ventilator and had a seizure episode of 3 minutes last night.   PAST MEDICAL HISTORY :   has a past medical history of Anxiety, COPD (chronic obstructive pulmonary disease) ([place]), Essential hypertension, History of echocardiogram, Morbid obesity ([place]), and Tobacco abuse.  has a past surgical history that includes Cholecystectomy; Abdominal hysterectomy; Hernia repair; Extubation (endotracheal) in or (N/A, 08/14/2017); and TEE without cardioversion (N/A, 09/20/2017). Prior to Admission medications   Medication Sig Start Date End Date Taking? Authorizing Provider  ADVAIR HFA 115-21 MCG/ACT inhaler Inhale 2 puffs into the lungs 2 (two) times daily. 11/21/19  Yes [provider]  albuterol (PROVENTIL HFA;VENTOLIN HFA) 108 (90 Base) MCG/ACT inhaler Inhale 2 puffs into the lungs every 6 (six) hours as needed for wheezing or shortness of breath. 06/12/17  Yes Gouru, Aruna, MD  clonazePAM (KLONOPIN) 0.5 MG tablet Take 0.5 mg by mouth 3 (three) times daily as needed for anxiety.    Yes [provider]  furosemide (LASIX) 40 MG tablet Take 40 mg by mouth 2 (two) times daily. 11/21/19  Yes [provider]  ipratropium-albuterol (DUONEB) 0.5-2.5 (3) MG/3ML SOLN Take 3 mLs by nebulization every 6 (six) hours. Patient taking differently: Take 3 mLs by nebulization 2 (two) times daily.  08/17/17  Yes Epifanio Lesches, MD  ipratropium-albuterol (DUONEB) 0.5-2.5 (3) MG/3ML SOLN Inhale 3 mLs into the lungs every 8 (eight) hours as needed. 11/21/19  Yes [provider]  omeprazole (PRILOSEC) 20 MG capsule Take 20 mg by mouth daily.   Yes [provider]  SPIRIVA HANDIHALER 18 MCG inhalation capsule Place 1 capsule into inhaler and inhale daily. 11/21/19  Yes [provider]  cyclobenzaprine (FLEXERIL) 10 MG tablet Take 10 mg by mouth 3  (three) times daily as needed for muscle spasms.  Patient not taking: Reported on 12/21/2019 08/16/16   [provider]  fluticasone (FLOVENT HFA) 44 MCG/ACT inhaler Inhale 2 puffs into the lungs daily. Patient not taking: Reported on 12/21/2019    [provider]  magic mouthwash w/lidocaine SOLN Take 5 mLs by mouth 4 (four) times daily. Patient not taking: Reported on 12/21/2019 11/16/17   Cuthriell, Charline Bills, PA-C   No Known Allergies  FAMILY HISTORY:  family history includes Dementia in her mother; Heart attack in her father. SOCIAL HISTORY:  reports that she has been smoking cigarettes. She has a 30.00 pack-year smoking history. She has never used smokeless tobacco. She reports that she does not drink alcohol and does not use drugs.  REVIEW OF SYSTEMS:   Unable to obtain due to critical illness      Estimated body mass index is 50.59 kg/m as calculated from the following:   Height as of this encounter: 5' (1.524 m).   Weight as of this encounter: 117.5 kg.    VITAL SIGNS: Temp:  [98.6 F (37 C)-100 F (37.8 C)] 99.5 F (37.5 C) (08/13 1300) Pulse Rate:  [48-57] 55 (08/13 1300) Resp:  [11-21] 16 (08/13 1300) BP: (100-159)/(67-101) 100/67 (08/13 1300) SpO2:  [90 %-97 %] 93 % (08/13 1459) FiO2 (%):  [35 %-40 %] 40 % (08/13 1459) Weight:  [117.5 kg] 117.5 kg (08/13 0500)   I/O last 3 completed shifts: In: 2585 [I.V.:728.1; NG/GT:360; IV Piggyback:449.9] Out: 3975 [Urine:3975] Total I/O In: 989.8 [I.V.:628.4; IV Piggyback:361.4] Out: 850 [Urine:850]   SpO2: 93 % O2 Flow Rate (L/min):  10 L/min FiO2 (%): 40 %   Physical Examination:  Sedated to RASS of -3 On vent, no distress, bilateral equal air entry and no adventitious sounds S1 & S2 are audible with no murmur Benign abdominal exam with normal peristalsis No leg edema   CULTURE RESULTS   Recent Results (from the past 240 hour(s))  MRSA PCR Screening     Status: None   Collection Time:  12/20/19  8:08 PM   Specimen: Nasal Mucosa; Nasopharyngeal  Result Value Ref Range Status   MRSA by PCR NEGATIVE NEGATIVE Final    Comment:        The GeneXpert MRSA Assay (FDA approved for NASAL specimens only), is one component of a comprehensive MRSA colonization surveillance program. It is not intended to diagnose MRSA infection nor to guide or monitor treatment for MRSA infections. Performed at [facility], [address] [place], [place] [address]   SARS Coronavirus 2 by RT PCR (hospital order, performed in [facility] hospital lab) Nasopharyngeal Nasopharyngeal Swab     Status: None   Collection Time: 12/21/19  4:12 AM   Specimen: Nasopharyngeal Swab  Result Value Ref Range Status   SARS Coronavirus 2 NEGATIVE NEGATIVE Final    Comment: (NOTE) SARS-CoV-2 target nucleic acids are NOT DETECTED.  The SARS-CoV-2 RNA is generally detectable in upper and lower respiratory specimens during the acute phase of infection. The lowest concentration of SARS-CoV-2 viral copies this assay can detect is 250 copies / mL. A negative result does not preclude SARS-CoV-2 infection and should not be used as the sole basis for treatment or other patient management decisions.  A negative result may occur with improper specimen collection / handling, submission of specimen other than nasopharyngeal swab, presence of viral mutation(s) within the areas targeted by this assay, and inadequate number of viral copies (<250 copies / mL). A negative result must be combined with clinical observations, patient history, and epidemiological information.  Fact Sheet for Patients:   StrictlyIdeas.no  Fact Sheet for Healthcare Providers: BankingDealers.co.za  This test is not yet approved or  cleared by the [place] FDA and has been authorized for detection and/or diagnosis of SARS-CoV-2 by FDA under an Emergency Use Authorization (EUA).  This  EUA will remain in effect (meaning this test can be used) for the duration of the COVID-19 declaration under Section 564(b)(1) of the Act, 21 U.S.C. section 360bbb-3(b)(1), unless the authorization is terminated or revoked sooner.  Performed at [facility], [address] [place], [place] [address]   Culture, blood (Routine X 2) w Reflex to ID Panel     Status: None   Collection Time: 12/21/19 10:33 AM   Specimen: BLOOD  Result Value Ref Range Status   Specimen Description BLOOD [facility]  Final   Special Requests   Final    BOTTLES DRAWN AEROBIC AND ANAEROBIC Blood Culture adequate volume   Culture   Final    NO GROWTH 5 DAYS Performed at [facility], [address], [place], [place] [address]    Report Status 12/26/2019 FINAL  Final  Culture, blood (Routine X 2) w Reflex to ID Panel     Status: None   Collection Time: 12/21/19 10:45 AM   Specimen: BLOOD  Result Value Ref Range Status   Specimen Description BLOOD [facility]  Final   Special Requests   Final    BOTTLES DRAWN AEROBIC ONLY Blood Culture results may not be optimal due to an inadequate volume of blood  received in culture bottles   Culture   Final    NO GROWTH 5 DAYS Performed at [facility], [address] [place], [place] [address]    Report Status 12/26/2019 FINAL  Final  SARS Coronavirus 2 by RT PCR (hospital order, performed in [facility] hospital lab) Nasopharyngeal Nasopharyngeal Swab     Status: None   Collection Time: 12/22/19 11:28 AM   Specimen: Nasopharyngeal Swab  Result Value Ref Range Status   SARS Coronavirus 2 NEGATIVE NEGATIVE Final    Comment: (NOTE) SARS-CoV-2 target nucleic acids are NOT DETECTED.  The SARS-CoV-2 RNA is generally detectable in upper and lower respiratory specimens during the acute phase of infection. The lowest concentration of SARS-CoV-2 viral copies this assay can detect is 250 copies / mL. A negative result does not preclude SARS-CoV-2  infection and should not be used as the sole basis for treatment or other patient management decisions.  A negative result may occur with improper specimen collection / handling, submission of specimen other than nasopharyngeal swab, presence of viral mutation(s) within the areas targeted by this assay, and inadequate number of viral copies (<250 copies / mL). A negative result must be combined with clinical observations, patient history, and epidemiological information.  Fact Sheet for Patients:   StrictlyIdeas.no  Fact Sheet for Healthcare Providers: BankingDealers.co.za  This test is not yet approved or  cleared by the [place] FDA and has been authorized for detection and/or diagnosis of SARS-CoV-2 by FDA under an Emergency Use Authorization (EUA).  This EUA will remain in effect (meaning this test can be used) for the duration of the COVID-19 declaration under Section 564(b)(1) of the Act, 21 U.S.C. section 360bbb-3(b)(1), unless the authorization is terminated or revoked sooner.  Performed at [facility], [address] [place], [place] [address]           IMAGING    EEG  Result Date: 12/26/2019 Lora Havens, MD     12/26/2019  3:13 PM Patient Name: [name] MRN: [id number] Epilepsy Attending: Lora Havens Referring Physician/Provider: Marda Stalker, NP Date: 12/26/2019 Duration: 25.24 mins Patient history: 54 year old woman with history of COPD amongst other comorbidities, admitted for acute respiratory failure, exhibited 3 minutes worth of seizure activity as noted by nursing staff overnight. EEG to evaluate for seizure. Level of alertness:  comatose AEDs during EEG study: LEV Technical aspects: This EEG study was done with scalp electrodes positioned according to the 10-20 International system of electrode placement. Electrical activity was acquired at a sampling rate of '500Hz'  and reviewed with a  high frequency filter of '70Hz'  and a low frequency filter of '1Hz' . EEG data were recorded continuously and digitally stored. Description: EEG showed continuous generalized 2-'3Hz'  delta slowing, at times sharply contoured. Hyperventilation and photic stimulation were not performed.   ABNORMALITY -Continuous slow, generalized IMPRESSION: This study is suggestive of severe diffuse encephalopathy, nonspecific etiology but could be related to sedation. No seizures or definite epileptiform discharges were seen throughout the recording. Lora Havens   CT HEAD WO CONTRAST  Result Date: 12/26/2019 CLINICAL DATA:  New onset seizure. EXAM: CT HEAD WITHOUT CONTRAST TECHNIQUE: Contiguous axial images were obtained from the base of the skull through the vertex without intravenous contrast. COMPARISON:  None. FINDINGS: Brain: There appears to be contrast in the vessels. I suspect the patient has had a recent contrast enhanced study although I do not see any in our system system. Recommend clinical correlation. The ventricles are  normal in size and configuration. No extra-axial fluid collections are identified. The gray-white differentiation is maintained. No CT findings for acute hemispheric infarction or intracranial hemorrhage. No mass lesions. Largely empty sella. The brainstem and cerebellum are normal. Vascular: No significant findings. Skull: No acute skull fracture. No bone lesion. Sinuses/Orbits: Ethmoid and sphenoid sinus disease is noted along with extensive fluid throughout the mastoid air cells bilaterally. The frontal and visualized maxillary sinuses are clear. The globes are intact. Other: No scalp lesions, laceration or hematoma. IMPRESSION: 1. No acute intracranial findings or mass lesions. 2. Ethmoid and sphenoid sinus disease and extensive fluid throughout the mastoid air cells bilaterally. Electronically Signed   By: Marijo Sanes M.D.   On: 12/26/2019 07:02   DG Chest Port 1 View  Result Date:  12/26/2019 CLINICAL DATA:  Acute respiratory distress. EXAM: PORTABLE CHEST 1 VIEW COMPARISON:  12/25/2019. FINDINGS: The endotracheal tube, NG tube and right PICC lines are stable. Stable cardiac enlargement, pulmonary edema and bibasilar atelectasis. No definite pleural effusions or pneumothorax. IMPRESSION: 1. Stable support apparatus. 2. Stable cardiac enlargement, pulmonary edema and bibasilar atelectasis. Electronically Signed   By: Marijo Sanes M.D.   On: 12/26/2019 06:23   Assessment and plan:  Acute on Chronic Hypoxic Hypercapnic Respiratory Failure in setting of Acute COPD Exacerbation and Pneumonia Hx: COPD on 4 L/min home O2 nasal cannula -Full vent support -Wean FiO2 and PEEP as tolerated to maintain O2 saturations 88 to 92% -Daily spontaneous breathing trials when respiratory parameters met -Failed SBT 12/25/2019 -Scheduled Bronchodilators -Inhaled steroids -Optimize systemic steroids -Noted to have pulmonary edema on chest x-ray 8/12/1, will give 40 mg IV Lasix x1 dose -Patient's family has requested ENT consult for tracheostomy placement following several failed weans due to previous prolonged and traumatic endotracheal intubation.  Have contacted Dr. Pryor Ochoa who will see the patient.  Tentative plan for possible trach on Tuesday 12/30/19   Pneumonia.  Bilateral airspace disease (improved) -Monitor fever curve -Trend WBCs and procalcitonin -Follow cultures as above -Continue azithromycin and ceftriaxone -Has tested negative for COVID-19 x3  New onset seizure.  EEG reported severe diffuse encephalopathy, could be related to sedation, no seizure or definite epileptiform discharges. CT head negative for acute intracranial abnormality. -Keppra -MRI of head -Follow with neurology recommendations.  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  DVT & GI prophylaxis.  Continue supportive care.  Critical care time 35 minutes

## 2019-12-26 NOTE — Consult Note (Signed)
PHARMACY CONSULT NOTE   Pharmacy Consult for Electrolyte Monitoring and Replacement   Recent Labs: Potassium (mmol/L)  Date Value  12/26/2019 3.6  03/26/2014 3.9   Magnesium (mg/dL)  Date Value  98/26/4158 1.7  03/20/2012 1.5 (L)   Calcium (mg/dL)  Date Value  30/94/0768 7.9 (L)   Calcium, Total (mg/dL)  Date Value  08/81/1031 8.5   Albumin (g/dL)  Date Value  59/45/8592 3.1 (L)  03/26/2014 3.2 (L)   Phosphorus (mg/dL)  Date Value  92/44/6286 3.0   Sodium (mmol/L)  Date Value  12/26/2019 139  03/26/2014 142   Corrected Ca: 8.6 mg/dL  Assessment: [name] Is 54 y.o female with PMH significant for has a past medical history of Anxiety, COPD, HTN, morbid obesity, and tobacco abuse presented with progressive shortness of breath and fatigue x3 days, found to be in acute hypercarbic and hypoxic respiratory failure; failed BiPAP and had to be emergently intubated; failed SBT 8/11. She experienced a seizure this morning   Goal of Therapy:  Potassium 4.0 - 5.1 mmol/L Magnesium 2.0 - 2.4 mg/dL All Other Electrolytes WNL  Plan:   10 mEq IV KCl x 4 per D Blakeney, NP  2 grams IV magnesium sulfate x 1  Re-check electrolytes in AM   [name] 12/26/2019 7:30 AM

## 2019-12-26 NOTE — Progress Notes (Signed)
Pt with new onset seizure activity for approximately 3 minutes therefore administered 2 mg ativan, stopped precedex gtt and started versed gtt, EEG ordered, neurology consult placed, and stat CT Head pending.  Will continue to monitor and assess pt.  Sonda Rumble, AGNP  Pulmonary/Critical Care Pager [phone] (please enter 7 digits) PCCM Consult Pager [phone] (please enter 7 digits)

## 2019-12-26 NOTE — Progress Notes (Signed)
PT had seizure activities that lasted almost 3 minutes with deviated pupils. NP notified. Ativan 2 mg x1,  Keppra  ordered & given; versed gtt initiated, Stat CT scan of head completed. Will continue monitor pt closely.

## 2019-12-26 NOTE — Consult Note (Signed)
Neurology Consultation  Reason for Consult: Seizure Referring Physician: Cassandria Santee, MD  CC: Seizure  History is obtained from: Chart review  HPI: [name] is a 54 y.o. female past medical history of COPD hypertension morbid obesity tobacco abuse and anxiety admitted to the hospital for treatment of acute respiratory failure with hypercapnia and hypoxemia since 12/21/2019, being managed by critical care after she had to be intubated for the management of the respiratory failure, exhibited 3 minutes worth of seizure activity overnight/early morning for which neurological consultation was obtained. Chart review reveals a note from 5:48 AM from the patient's overnight RN with says that the patient has seizure activity that lasted almost 3 minutes with deviated pupils.  They notified the primary team nurse practitioner.  Ativan 2 mg x 1 was given and a Keppra load of 1 g IV was given.  Stat CT scan head was done-reviewed by me personally-negative for acute process. Patient unable to provide any history at this time.  Review of patient chart reveals that she was seen by Dr. Doy Mince from neurology in 2019 for concerns for involuntary jerking, which is intermittent, and was deemed to be related to her respiratory disease with the hypercapnia and hypoxemia.  She was recommended to take clonazepam as needed.  Unclear if she has been having further similar episodes at home prior to presentation.  No family bedside at this time.  Attempted to call daughter-Ms. [name] at [phone] with no response  ROS: Unable to obtain due to intubated sedated patient  Past Medical History:  Diagnosis Date  . Anxiety   . COPD (chronic obstructive pulmonary disease) ([place])   . Essential hypertension   . History of echocardiogram    a. 08/2013 Echo: EF 55-60%, impaired LV relaxation, mild LVH, nl RV fxn, nl RVSP, mild TR; b. 05/2017 Echo: EF 65-70%, no rwma, nl RV fxn.  . Morbid obesity ([place])   . Tobacco abuse     Family History  Problem Relation Age of Onset  . Dementia Mother   . Heart attack Father    Social History:   reports that she has been smoking cigarettes. She has a 30.00 pack-year smoking history. She has never used smokeless tobacco. She reports that she does not drink alcohol and does not use drugs.  Medications  Current Facility-Administered Medications:  .  budesonide (PULMICORT) nebulizer solution 0.5 mg, 0.5 mg, Nebulization, Q12H, Tukov-Yual, Magdalene S, NP, 0.5 mg at 12/25/19 2036 .  cefTRIAXone (ROCEPHIN) 1 g in sodium chloride 0.9 % 100 mL IVPB, 1 g, Intravenous, Q24H, Dallie Piles, RPH, Last Rate: 200 mL/hr at 12/26/19 0405, 1 g at 12/26/19 0405 .  chlorhexidine gluconate (MEDLINE KIT) (PERIDEX) 0.12 % solution 15 mL, 15 mL, Mouth Rinse, BID, Lanney Gins, Fuad, MD, 15 mL at 12/25/19 2035 .  Chlorhexidine Gluconate Cloth 2 % PADS 6 each, 6 each, Topical, Q0600, Ottie Glazier, MD, 6 each at 12/25/19 2157 .  dexmedetomidine (PRECEDEX) 400 MCG/100ML (4 mcg/mL) infusion, 0.4-1.2 mcg/kg/hr, Intravenous, Titrated, Darel Hong D, NP, Last Rate: 30.4 mL/hr at 12/26/19 0419, 1 mcg/kg/hr at 12/26/19 0419 .  docusate (COLACE) 50 MG/5ML liquid 100 mg, 100 mg, Per Tube, BID, Benita Gutter, RPH, 100 mg at 12/26/19 0835 .  docusate sodium (COLACE) capsule 100 mg, 100 mg, Oral, BID PRN, Tukov-Yual, Magdalene S, NP .  famotidine (PEPCID) IVPB 20 mg premix, 20 mg, Intravenous, Q12H, Tukov-Yual, Magdalene S, NP, Last Rate: 100 mL/hr at 12/25/19 2155, 20 mg at 12/25/19 2155 .  feeding supplement (PROSource TF) liquid 45 mL, 45 mL, Per Tube, Daily, Samaan, Maged, MD .  feeding supplement (VITAL HIGH PROTEIN) liquid 1,000 mL, 1,000 mL, Per Tube, Continuous, Samaan, Maged, MD, Last Rate: 50 mL/hr at 12/25/19 1647, 1,000 mL at 12/25/19 1647 .  fentaNYL (SUBLIMAZE) injection 50 mcg, 50 mcg, Intravenous, Q15 min PRN, Tukov-Yual, Magdalene S, NP .  fentaNYL (SUBLIMAZE) injection 50-200 mcg,  50-200 mcg, Intravenous, Q30 min PRN, Tukov-Yual, Magdalene S, NP, 100 mcg at 12/23/19 1518 .  fentaNYL 2550mg in NS 2569m(1060mml) infusion-PREMIX, 0-400 mcg/hr, Intravenous, Continuous, GruDallie PilesPH, Last Rate: 10 mL/hr at 12/26/19 0415, 100 mcg/hr at 12/26/19 0415 .  heparin injection 5,000 Units, 5,000 Units, Subcutaneous, Q8H, Tukov-Yual, Magdalene S, NP, 5,000 Units at 12/25/19 2158 .  insulin aspart (novoLOG) injection 0-20 Units, 0-20 Units, Subcutaneous, Q4H, KeeDarel Hong NP, 4 Units at 12/26/19 0411 .  ipratropium-albuterol (DUONEB) 0.5-2.5 (3) MG/3ML nebulizer solution 3 mL, 3 mL, Nebulization, Q6H, Tukov-Yual, Magdalene S, NP, 3 mL at 12/26/19 0232 .  ipratropium-albuterol (DUONEB) 0.5-2.5 (3) MG/3ML nebulizer solution 3 mL, 3 mL, Nebulization, Q6H PRN, BlaMelene MulleranDreama SaaP .  levETIRAcetam (KEPPRA) IVPB 500 mg/100 mL premix, 500 mg, Intravenous, Q12H, Blakeney, Dana G, NP .  LORazepam (ATIVAN) 2 MG/ML injection, , , ,  .  MEDLINE mouth rinse, 15 mL, Mouth Rinse, 10 times per day, AleOttie GlazierD, 15 mL at 12/26/19 0539 .  methylPREDNISolone sodium succinate (SOLU-MEDROL) 40 mg/mL injection 40 mg, 40 mg, Intravenous, Q6H, KeeDarel Hong NP, 40 mg at 12/26/19 0156 .  midazolam (VERSED) 50 mg/50 mL (1 mg/mL) premix infusion, 0.5-10 mg/hr, Intravenous, Titrated, KeeDarel Hong NP, Last Rate: 0.5 mL/hr at 12/26/19 0556, 0.5 mg/hr at 12/26/19 0556 .  multivitamin with minerals tablet 1 tablet, 1 tablet, Per Tube, Daily, Samaan, Maged, MD, 1 tablet at 12/26/19 0835 .  nicotine (NICODERM CQ - dosed in mg/24 hr) patch 7 mg, 7 mg, Transdermal, Daily, GruDallie PilesPH, 7 mg at 12/25/19 1138 .  pneumococcal 23 valent vaccine (PNEUMOVAX-23) injection 0.5 mL, 0.5 mL, Intramuscular, Prior to discharge, GruDallie PilesPH .  polyethylene glycol (MIRALAX / GLYCOLAX) packet 17 g, 17 g, Oral, Daily PRN, Tukov-Yual, Magdalene S, NP .  polyethylene glycol (MIRALAX /  GLYCOLAX) packet 17 g, 17 g, Oral, Daily, Tukov-Yual, Magdalene S, NP, 17 g at 12/25/19 1134 .  potassium chloride 10 mEq in 50 mL *CENTRAL LINE* IVPB, 10 mEq, Intravenous, Q1 Hr x 4, Blakeney, Dana G, NP .  sodium chloride flush (NS) 0.9 % injection 10-40 mL, 10-40 mL, Intracatheter, Q12H, Samaan, Maged, MD, 10 mL at 12/25/19 2157 .  sodium chloride flush (NS) 0.9 % injection 10-40 mL, 10-40 mL, Intracatheter, PRN, SamSoyla Murphyaged, MD   Exam: Current vital signs: BP (!) 157/101   Pulse (!) 48   Temp 99.5 F (37.5 C)   Resp 16   Ht 5' (1.524 m)   Wt 117.5 kg   SpO2 97%   BMI 50.59 kg/m  Vital signs in last 24 hours: Temp:  [98.2 F (36.8 C)-100 F (37.8 C)] 99.5 F (37.5 C) (08/13 0600) Pulse Rate:  [48-62] 48 (08/13 0600) Resp:  [11-20] 16 (08/13 0600) BP: (111-159)/(73-101) 157/101 (08/13 0600) SpO2:  [91 %-97 %] 97 % (08/13 0600) FiO2 (%):  [35 %] 35 % (08/13 0234) Weight:  [117.5 kg] 117.5 kg (08/13 0500) General: Intubated, on sedation with fentanyl and Versed. HEENT: Normocephalic atraumatic Lungs: Ventilated  CVS: Regular rate and rhythm Extremities: Trace edema bilaterally Abdomen: Obese, nondistended Neurological exam Sedated, intubated.  Currently on Versed and fentanyl drips. Does not following commands Does not open eyes to noxious stimulation but does grimace to noxious stimulation on bilateral trapezius. Cranial: Pupils are pinpoint and sluggishly reactive to light, no gaze preference or deviation, does not blink to threat from either side, facial symmetry difficult to ascertain due to the endotracheal tube, bilateral corneal reflexes are present. Motor exam: No spontaneous movements.  No withdrawal on noxious immolation. Sensory exam: As above, in addition some grimacing to noxious stimulation on the trapezius bilaterally.  No grimacing or withdrawal to noxious stimulation in lower extremities  Labs I have reviewed labs in epic and the results pertinent to this  consultation are:   CBC    Component Value Date/Time   WBC 7.2 12/26/2019 0513   RBC 7.24 (H) 12/26/2019 0513   HGB 17.5 (H) 12/26/2019 0513   HGB 15.9 03/26/2014 1103   HCT 57.8 (H) 12/26/2019 0513   HCT 48.5 (H) 03/26/2014 1103   PLT 165 12/26/2019 0513   PLT 303 03/26/2014 1103   MCV 79.8 (L) 12/26/2019 0513   MCV 87 03/26/2014 1103   MCH 24.2 (L) 12/26/2019 0513   MCHC 30.3 12/26/2019 0513   RDW 21.3 (H) 12/26/2019 0513   RDW 14.1 03/26/2014 1103   LYMPHSABS 0.3 (L) 12/26/2019 0513   LYMPHSABS 1.8 09/10/2013 0435   MONOABS 0.6 12/26/2019 0513   MONOABS 0.5 09/10/2013 0435   EOSABS 0.0 12/26/2019 0513   EOSABS 0.2 09/10/2013 0435   BASOSABS 0.0 12/26/2019 0513   BASOSABS 0.1 09/10/2013 0435    CMP     Component Value Date/Time   NA 139 12/26/2019 0513   NA 142 03/26/2014 1103   K 3.6 12/26/2019 0513   K 3.9 03/26/2014 1103   CL 99 12/26/2019 0513   CL 105 03/26/2014 1103   CO2 28 12/26/2019 0513   CO2 34 (H) 03/26/2014 1103   GLUCOSE 163 (H) 12/26/2019 0513   GLUCOSE 83 03/26/2014 1103   BUN 29 (H) 12/26/2019 0513   BUN 6 (L) 03/26/2014 1103   CREATININE 0.54 12/26/2019 0513   CREATININE 0.76 03/26/2014 1103   CALCIUM 7.9 (L) 12/26/2019 0513   CALCIUM 8.5 03/26/2014 1103   PROT 6.5 12/23/2019 0354   PROT 7.4 03/26/2014 1103   ALBUMIN 3.1 (L) 12/23/2019 0354   ALBUMIN 3.2 (L) 03/26/2014 1103   AST 11 (L) 12/23/2019 0354   AST 20 03/26/2014 1103   ALT 8 12/23/2019 0354   ALT 32 03/26/2014 1103   ALKPHOS 52 12/23/2019 0354   ALKPHOS 93 03/26/2014 1103   BILITOT 1.5 (H) 12/23/2019 0354   BILITOT 0.6 03/26/2014 1103   GFRNONAA >60 12/26/2019 0513   GFRNONAA >60 03/26/2014 1103   GFRNONAA >60 09/10/2013 0435   GFRAA >60 12/26/2019 0513   GFRAA >60 03/26/2014 1103   GFRAA >60 09/10/2013 0435   ABG    Component Value Date/Time   PHART 7.33 (L) 12/25/2019 1451   PCO2ART 79 (HH) 12/25/2019 1451   PO2ART 69 (L) 12/25/2019 1451   HCO3 41.7 (H)  12/25/2019 1451   O2SAT 92.2 12/25/2019 1451   Imaging I have reviewed the images obtained: CT-scan of the brain-no acute changes.  Extensive ethmoid and sphenoid sinus disease and extensive fluid throughout bilateral mastoids.  Assessment:  54 year old woman with history of COPD amongst other comorbidities, admitted for acute respiratory failure, exhibited 3 minutes worth  of seizure activity as noted by nursing staff overnight. Activity abated with 2 mg of Ativan IV.  Loaded with Keppra 1 g IV. Labs reveal hypercarbia on the arterial blood gas from 12/25/2019. ABG from this morning pending Patient does not have a history of seizures based on chart review but does have a history of involuntary twitching in the past, for which she was seen by neurology in consultation in one of her prior hospitalizations and those movements were deemed to be secondary to her hypoxemia/hypercarbia. Yesterday's episode semiology is unclear but given her continuing deranged respiratory status, she could have had a provoked seizure or this could have been abnormal movements in the setting of hypercarbia. Currently on fentanyl and very small dose of Versed drip, with intact brainstem reflexes, but no purposeful movements on exam-likely represents the effect of sedation as well as possible CO2 narcosis.  Unclear at this time whether the seizure activity was true seizures and if it was whether was provoked. Imaging unremarkable Will need further work-up.  Impression: #1 Seizure-likely first-time and likely provoked in the setting of acute respiratory illness #2  Toxic metabolic encephalopathy due to #3 #4 #3  Hypercarbic respiratory failure #4  Hypoxic respiratory failure #5 Obesity #6 Tobacco abuse  Recommendations: -Agree with the Keppra load. -Continue Keppra 500 twice daily -Routine EEG -I would also recommend obtaining an MRI of the brain for completing seizure work-up. -Attempt to minimize sedation as  tolerated to get a better neurological exam. -Current exam extensively limited by sedation, and also possibly metabolic derangements such as hypercarbia. -Management of metabolic derangements per primary team as you are Inpatient neurology will follow with you  -- Amie Portland, MD [facility] Pager: [phone] If 7pm to 7am, please call on call as listed on AMION.  CRITICAL CARE ATTESTATION Performed by: Amie Portland, MD Total critical care time: 31  minutes Critical care time was exclusive of separately billable procedures and treating other patients and/or supervising APPs/Residents/Students Critical care was necessary to treat or prevent imminent or life-threatening deterioration due to seizure, toxic metabolic encephalopathy This patient is critically ill and at significant risk for neurological worsening and/or death and care requires constant monitoring. Critical care was time spent personally by me on the following activities: development of treatment plan with patient and/or surrogate as well as nursing, discussions with consultants, evaluation of patient's response to treatment, examination of patient, obtaining history from patient or surrogate, ordering and performing treatments and interventions, ordering and review of laboratory studies, ordering and review of radiographic studies, pulse oximetry, re-evaluation of patient's condition, participation in multidisciplinary rounds and medical decision making of high complexity in the care of this patient.

## 2019-12-26 NOTE — Procedures (Signed)
Patient Name: [name]  MRN: [id number]  Epilepsy Attending: Charlsie Quest  Referring Physician/Provider: Sonda Rumble, NP Date: 12/26/2019 Duration: 25.24 mins  Patient history: 54 year old woman with history of COPD amongst other comorbidities, admitted for acute respiratory failure, exhibited 3 minutes worth of seizure activity as noted by nursing staff overnight. EEG to evaluate for seizure.  Level of alertness:  comatose  AEDs during EEG study: LEV  Technical aspects: This EEG study was done with scalp electrodes positioned according to the 10-20 International system of electrode placement. Electrical activity was acquired at a sampling rate of 500Hz  and reviewed with a high frequency filter of 70Hz  and a low frequency filter of 1Hz . EEG data were recorded continuously and digitally stored.   Description: EEG showed continuous generalized 2-3Hz  delta slowing, at times sharply contoured. Hyperventilation and photic stimulation were not performed.     ABNORMALITY -Continuous slow, generalized  IMPRESSION: This study is suggestive of severe diffuse encephalopathy, nonspecific etiology but could be related to sedation. No seizures or definite epileptiform discharges were seen throughout the recording.  Rishav Rockefeller 

## 2019-12-26 NOTE — Progress Notes (Signed)
eeg done °

## 2019-12-27 DIAGNOSIS — J9602 Acute respiratory failure with hypercapnia: Secondary | ICD-10-CM | POA: Diagnosis not present

## 2019-12-27 DIAGNOSIS — I6783 Posterior reversible encephalopathy syndrome: Secondary | ICD-10-CM

## 2019-12-27 DIAGNOSIS — G92 Toxic encephalopathy: Secondary | ICD-10-CM

## 2019-12-27 DIAGNOSIS — J9601 Acute respiratory failure with hypoxia: Secondary | ICD-10-CM | POA: Diagnosis not present

## 2019-12-27 LAB — GLUCOSE, CAPILLARY
Glucose-Capillary: 114 mg/dL — ABNORMAL HIGH (ref 70–99)
Glucose-Capillary: 125 mg/dL — ABNORMAL HIGH (ref 70–99)
Glucose-Capillary: 140 mg/dL — ABNORMAL HIGH (ref 70–99)
Glucose-Capillary: 150 mg/dL — ABNORMAL HIGH (ref 70–99)
Glucose-Capillary: 161 mg/dL — ABNORMAL HIGH (ref 70–99)
Glucose-Capillary: 197 mg/dL — ABNORMAL HIGH (ref 70–99)

## 2019-12-27 LAB — BASIC METABOLIC PANEL
Anion gap: 7 (ref 5–15)
BUN: 34 mg/dL — ABNORMAL HIGH (ref 6–20)
CO2: 32 mmol/L (ref 22–32)
Calcium: 8.5 mg/dL — ABNORMAL LOW (ref 8.9–10.3)
Chloride: 96 mmol/L — ABNORMAL LOW (ref 98–111)
Creatinine, Ser: 0.65 mg/dL (ref 0.44–1.00)
GFR calc Af Amer: >60 mL/min (ref >60)
GFR calc non Af Amer: >60 mL/min (ref >60)
Glucose, Bld: 135 mg/dL — ABNORMAL HIGH (ref 70–99)
Potassium: 4.1 mmol/L (ref 3.5–5.1)
Sodium: 135 mmol/L (ref 135–145)

## 2019-12-27 LAB — CBC WITH DIFFERENTIAL/PLATELET
Abs Immature Granulocytes: 0.05 10*3/uL (ref 0.00–0.07)
Basophils Absolute: 0 10*3/uL (ref 0.0–0.1)
Basophils Relative: 0 %
Eosinophils Absolute: 0 10*3/uL (ref 0.0–0.5)
Eosinophils Relative: 0 %
HCT: 52.9 % — ABNORMAL HIGH (ref 36.0–46.0)
Hemoglobin: 15.4 g/dL — ABNORMAL HIGH (ref 12.0–15.0)
Immature Granulocytes: 1 %
Lymphocytes Relative: 6 %
Lymphs Abs: 0.6 10*3/uL — ABNORMAL LOW (ref 0.7–4.0)
MCH: 24 pg — ABNORMAL LOW (ref 26.0–34.0)
MCHC: 29.1 g/dL — ABNORMAL LOW (ref 30.0–36.0)
MCV: 82.5 fL (ref 80.0–100.0)
Monocytes Absolute: 0.9 10*3/uL (ref 0.1–1.0)
Monocytes Relative: 10 %
Neutro Abs: 7.3 10*3/uL (ref 1.7–7.7)
Neutrophils Relative %: 83 %
Platelets: 193 10*3/uL (ref 150–400)
RBC: 6.41 MIL/uL — ABNORMAL HIGH (ref 3.87–5.11)
RDW: 21.3 % — ABNORMAL HIGH (ref 11.5–15.5)
WBC: 8.8 10*3/uL (ref 4.0–10.5)
nRBC: 0 % (ref 0.0–0.2)

## 2019-12-27 LAB — BLOOD GAS, ARTERIAL
Acid-Base Excess: 11.3 mmol/L — ABNORMAL HIGH (ref 0.0–2.0)
Bicarbonate: 36.6 mmol/L — ABNORMAL HIGH (ref 20.0–28.0)
FIO2: 0.4
O2 Saturation: 96.6 %
PEEP: 8 cmH2O
Patient temperature: 37
RATE: 16 resp/min
pCO2 arterial: 48 mmHg (ref 32.0–48.0)
pH, Arterial: 7.49 — ABNORMAL HIGH (ref 7.350–7.450)
pO2, Arterial: 80 mmHg — ABNORMAL LOW (ref 83.0–108.0)

## 2019-12-27 LAB — TRIGLYCERIDES: Triglycerides: 86 mg/dL (ref <150)

## 2019-12-27 LAB — MAGNESIUM: Magnesium: 2.4 mg/dL (ref 1.7–2.4)

## 2019-12-27 LAB — PHOSPHORUS: Phosphorus: 2.6 mg/dL (ref 2.5–4.6)

## 2019-12-27 MED ORDER — SODIUM CHLORIDE 0.9 % IV SOLN: INTRAVENOUS | Status: AC

## 2019-12-27 MED ORDER — GADOBUTROL 1 MMOL/ML IV SOLN
10.0000 mL | Freq: Once | INTRAVENOUS | Status: AC | PRN
  Administered 2019-12-27: 10 mL via INTRAVENOUS

## 2019-12-27 NOTE — Progress Notes (Signed)
Daughter Alben Deeds calling with questions regarding her mothers care, this RN was asked why MRI was not performed earlier as ordered and what was needed for her mother to be transferred to another facility.  After password was obtained daughter Alben Deeds was updated on current status and made aware that MRI had been performed and she could receive further update from the team when the exam was read.

## 2019-12-27 NOTE — Consult Note (Signed)
PHARMACY CONSULT NOTE   Pharmacy Consult for Electrolyte Monitoring and Replacement   Recent Labs: Potassium (mmol/L)  Date Value  12/27/2019 4.1  03/26/2014 3.9   Magnesium (mg/dL)  Date Value  92/03/9416 2.4  03/20/2012 1.5 (L)   Calcium (mg/dL)  Date Value  40/81/4481 8.5 (L)   Calcium, Total (mg/dL)  Date Value  85/63/1497 8.5   Albumin (g/dL)  Date Value  02/63/7858 3.1 (L)  03/26/2014 3.2 (L)   Phosphorus (mg/dL)  Date Value  85/06/7739 2.6   Sodium (mmol/L)  Date Value  12/27/2019 135  03/26/2014 142   Corrected Ca: 8.6 mg/dL  Assessment: [name] Is 54 y.o female with PMH significant for has a past medical history of Anxiety, COPD, HTN, morbid obesity, and tobacco abuse presented with progressive shortness of breath and fatigue x3 days, found to be in acute hypercarbic and hypoxic respiratory failure; failed BiPAP and had to be emergently intubated; failed SBT 8/11. Patient with seizure like activity, now on Keppra with Neurology following.   Goal of Therapy:  Potassium 4.0 - 5.1 mmol/L Magnesium 2.0 - 2.4 mg/dL All Other Electrolytes WNL  Plan:  No electrolyte replacement indicated at this time. Follow up with morning labs.  Pricilla Riffle, PharmD 12/27/2019 7:40 AM

## 2019-12-27 NOTE — Progress Notes (Signed)
Pt noted to be missing engagement ring on left hand.  Contacted pt's son; confirmed that his girlfriend had retrieved the ring on the night of 08/08.  Notified family that the son has the ring.

## 2019-12-27 NOTE — Progress Notes (Addendum)
Neurology Progress Note   S:// Seen and examined-remains on Versed 1 mg an hour and fentanyl 200 and hour with some improvement in mentation and ability to follow commands overnight. MRI completed overnight-findings consistent with posterior reversible encephalopathy syndrome-PRES Has not been super hypertensive in the hospital but has had some elevated blood pressure findings.  Had initial blood pressure readings in the 190s but over the past 24 to 48 hours has been no higher than 160s.  O:// Current vital signs: BP 110/67   Pulse (!) 56   Temp 98.8 F (37.1 C) (Oral)   Resp 16   Ht 5' (1.524 m)   Wt 117.8 kg   SpO2 94%   BMI 50.72 kg/m  Vital signs in last 24 hours: Temp:  [98.8 F (37.1 C)-99.7 F (37.6 C)] 98.8 F (37.1 C) (08/14 0400) Pulse Rate:  [52-66] 56 (08/14 0738) Resp:  [16-21] 16 (08/14 0738) BP: (99-124)/(58-76) 110/67 (08/14 0600) SpO2:  [90 %-96 %] 94 % (08/14 0738) FiO2 (%):  [40 %] 40 % (08/14 0738) Weight:  [117.8 kg] 117.8 kg (08/14 0445) General: Sedated intubated in no distress HEENT: Normocephalic atraumatic Lungs: Vented Cardiovascular: Regular rate rhythm Extremities warm well perfused Abdomen: Obese, nondistended Neurological exam Currently on Versed and fentanyl. Opens eyes to voice and follows commands Is able to blink her eyes inappropriate yes or no responses. Is able to attempt to mouth words. Was able to show me 2 fingers on her right hand to command. Was able to wiggle fingers on command. Cranial nerves: Pupils equal round reactive light, extraocular movements appear unrestricted, visual fields appear full, facial symmetry difficult to ascertain due to the tube. Motor exam: Is able to follow commands in all fours but is generally weak. Is able to show me 2 fingers on the right hand and lift the right arm and left arm somewhat antigravity. On the legs, is able to wiggle toes to command. Unable to lift both legs antigravity. Sensory exam:  Intact to touch Gait and coordination cannot be tested.  Medications  Current Facility-Administered Medications:  .  budesonide (PULMICORT) nebulizer solution 0.5 mg, 0.5 mg, Nebulization, Q12H, Tukov-Yual, Magdalene S, NP, 0.5 mg at 12/27/19 0738 .  chlorhexidine gluconate (MEDLINE KIT) (PERIDEX) 0.12 % solution 15 mL, 15 mL, Mouth Rinse, BID, Lanney Gins, Fuad, MD, 15 mL at 12/26/19 2035 .  Chlorhexidine Gluconate Cloth 2 % PADS 6 each, 6 each, Topical, Q0600, Ottie Glazier, MD, 6 each at 12/26/19 2222 .  dexmedetomidine (PRECEDEX) 400 MCG/100ML (4 mcg/mL) infusion, 0.4-1.2 mcg/kg/hr, Intravenous, Titrated, Bradly Bienenstock, NP, Stopped at 12/26/19 0551 .  docusate (COLACE) 50 MG/5ML liquid 100 mg, 100 mg, Per Tube, BID, Benita Gutter, RPH, 100 mg at 12/26/19 2222 .  docusate sodium (COLACE) capsule 100 mg, 100 mg, Oral, BID PRN, Tukov-Yual, Magdalene S, NP .  famotidine (PEPCID) IVPB 20 mg premix, 20 mg, Intravenous, Q12H, Tukov-Yual, Magdalene S, NP, Last Rate: 100 mL/hr at 12/26/19 2222, 20 mg at 12/26/19 2222 .  feeding supplement (PROSource TF) liquid 45 mL, 45 mL, Per Tube, Daily, Samaan, Maged, MD, 45 mL at 12/26/19 0839 .  feeding supplement (VITAL HIGH PROTEIN) liquid 1,000 mL, 1,000 mL, Per Tube, Continuous, Samaan, Maged, MD, Last Rate: 50 mL/hr at 12/26/19 1800, Rate Verify at 12/26/19 1800 .  fentaNYL (SUBLIMAZE) injection 50 mcg, 50 mcg, Intravenous, Q15 min PRN, Tukov-Yual, Magdalene S, NP .  fentaNYL (SUBLIMAZE) injection 50-200 mcg, 50-200 mcg, Intravenous, Q30 min PRN, Tukov-Yual, Arlyss Gandy, NP,  100 mcg at 12/27/19 0100 .  fentaNYL 2564mg in NS 2515m(1087mml) infusion-PREMIX, 0-400 mcg/hr, Intravenous, Continuous, GruDallie PilesPH, Last Rate: 20 mL/hr at 12/27/19 0334, 200 mcg/hr at 12/27/19 0334 .  heparin injection 5,000 Units, 5,000 Units, Subcutaneous, Q8H, Tukov-Yual, Magdalene S, NP, 5,000 Units at 12/27/19 0543 .  insulin aspart (novoLOG) injection 0-20  Units, 0-20 Units, Subcutaneous, Q4H, KeeDarel Hong NP, 4 Units at 12/27/19 0002 .  ipratropium-albuterol (DUONEB) 0.5-2.5 (3) MG/3ML nebulizer solution 3 mL, 3 mL, Nebulization, Q6H, Tukov-Yual, Magdalene S, NP, 3 mL at 12/27/19 0738 .  ipratropium-albuterol (DUONEB) 0.5-2.5 (3) MG/3ML nebulizer solution 3 mL, 3 mL, Nebulization, Q6H PRN, BlaMelene MulleranDreama SaaP .  levETIRAcetam (KEPPRA) IVPB 500 mg/100 mL premix, 500 mg, Intravenous, Q12H, Blakeney, DanDreama SaaP, Last Rate: 400 mL/hr at 12/27/19 0541, 500 mg at 12/27/19 0541 .  MEDLINE mouth rinse, 15 mL, Mouth Rinse, 10 times per day, AleOttie GlazierD, 15 mL at 12/27/19 0541 .  [COMPLETED] methylPREDNISolone sodium succinate (SOLU-MEDROL) 40 mg/mL injection 40 mg, 40 mg, Intravenous, Q8H, 40 mg at 12/27/19 0546 **FOLLOWED BY** [START ON 12/28/2019] methylPREDNISolone sodium succinate (SOLU-MEDROL) 40 mg/mL injection 40 mg, 40 mg, Intravenous, Q12H, Samaan, Maged, MD .  midazolam (VERSED) 50 mg/50 mL (1 mg/mL) premix infusion, 0.5-10 mg/hr, Intravenous, Titrated, KeeDarel Hong NP, Last Rate: 1 mL/hr at 12/26/19 2347, 1 mg/hr at 12/26/19 2347 .  multivitamin with minerals tablet 1 tablet, 1 tablet, Per Tube, Daily, Samaan, Maged, MD, 1 tablet at 12/26/19 0835 .  nicotine (NICODERM CQ - dosed in mg/24 hr) patch 7 mg, 7 mg, Transdermal, Daily, GruDallie PilesPH, 7 mg at 12/25/19 1138 .  pneumococcal 23 valent vaccine (PNEUMOVAX-23) injection 0.5 mL, 0.5 mL, Intramuscular, Prior to discharge, GruDallie PilesPH .  polyethylene glycol (MIRALAX / GLYCOLAX) packet 17 g, 17 g, Oral, Daily PRN, Tukov-Yual, Magdalene S, NP .  polyethylene glycol (MIRALAX / GLYCOLAX) packet 17 g, 17 g, Oral, Daily, Tukov-Yual, Magdalene S, NP, 17 g at 12/26/19 1014 .  sodium chloride flush (NS) 0.9 % injection 10-40 mL, 10-40 mL, Intracatheter, Q12H, Samaan, Maged, MD, 10 mL at 12/26/19 2222 .  sodium chloride flush (NS) 0.9 % injection 10-40 mL, 10-40 mL,  Intracatheter, PRN, SamCassandria SanteeD  Labs CBC    Component Value Date/Time   WBC 8.8 12/27/2019 0526   RBC 6.41 (H) 12/27/2019 0526   HGB 15.4 (H) 12/27/2019 0526   HGB 15.9 03/26/2014 1103   HCT 52.9 (H) 12/27/2019 0526   HCT 48.5 (H) 03/26/2014 1103   PLT 193 12/27/2019 0526   PLT 303 03/26/2014 1103   MCV 82.5 12/27/2019 0526   MCV 87 03/26/2014 1103   MCH 24.0 (L) 12/27/2019 0526   MCHC 29.1 (L) 12/27/2019 0526   RDW 21.3 (H) 12/27/2019 0526   RDW 14.1 03/26/2014 1103   LYMPHSABS 0.6 (L) 12/27/2019 0526   LYMPHSABS 1.8 09/10/2013 0435   MONOABS 0.9 12/27/2019 0526   MONOABS 0.5 09/10/2013 0435   EOSABS 0.0 12/27/2019 0526   EOSABS 0.2 09/10/2013 0435   BASOSABS 0.0 12/27/2019 0526   BASOSABS 0.1 09/10/2013 0435    CMP     Component Value Date/Time   NA 135 12/27/2019 0526   NA 142 03/26/2014 1103   K 4.1 12/27/2019 0526   K 3.9 03/26/2014 1103   CL 96 (L) 12/27/2019 0526   CL 105 03/26/2014 1103   CO2 32 12/27/2019 0526   CO2 34 (  H) 03/26/2014 1103   GLUCOSE 135 (H) 12/27/2019 0526   GLUCOSE 83 03/26/2014 1103   BUN 34 (H) 12/27/2019 0526   BUN 6 (L) 03/26/2014 1103   CREATININE 0.65 12/27/2019 0526   CREATININE 0.76 03/26/2014 1103   CALCIUM 8.5 (L) 12/27/2019 0526   CALCIUM 8.5 03/26/2014 1103   PROT 6.5 12/23/2019 0354   PROT 7.4 03/26/2014 1103   ALBUMIN 3.1 (L) 12/23/2019 0354   ALBUMIN 3.2 (L) 03/26/2014 1103   AST 11 (L) 12/23/2019 0354   AST 20 03/26/2014 1103   ALT 8 12/23/2019 0354   ALT 32 03/26/2014 1103   ALKPHOS 52 12/23/2019 0354   ALKPHOS 93 03/26/2014 1103   BILITOT 1.5 (H) 12/23/2019 0354   BILITOT 0.6 03/26/2014 1103   GFRNONAA >60 12/27/2019 0526   GFRNONAA >60 03/26/2014 1103   GFRNONAA >60 09/10/2013 0435   GFRAA >60 12/27/2019 0526   GFRAA >60 03/26/2014 1103   GFRAA >60 09/10/2013 0435    glycosylated hemoglobin  Lipid Panel     Component Value Date/Time   CHOL 174 09/20/2017 0757   TRIG 86 12/27/2019 0530   HDL  37 (L) 09/20/2017 0757   CHOLHDL 4.7 09/20/2017 0757   VLDL 25 09/20/2017 0757   LDLCALC 112 (H) 09/20/2017 0757   EEG: Suggestive of severe diffuse encephalopathy, nonspecific in etiology but could be related to sedation.  No definite epileptiform discharges or seizures seen throughout the recording.  Last ABG from 12/25/2019 with PCO2 of 79.  Imaging I have reviewed images in epic and the results pertinent to this consultation are: MRI of the brain consistent with findings of posterior reversible encephalopathy syndrome-patchy T2 flair signal abnormality involving cortical and subcortical aspects of both frontal and parietal lobes of the left cerebellum.  No other acute abnormality.  Underlying chronic microvascular disease.  Assessment: 54 year old woman with history of COPD amongst other comorbidities admitted for acute respiratory failure, exhibited 3 minutes worth of seizure activity noted on the morning of 12/26/2019, which abated after Ativan and was initiated on antiepileptic-Keppra. Imaging obtained overnight suggestive of posterior reversible encephalopathy syndrome. Seizure activity could have been secondary to the posterior reversible encephalopathy syndrome. Caveat: Has had some involuntary movements in the past which had been deemed to be secondary to the hypercarbia-unclear of there could have been seizures. I would continue her antiepileptics at least for the short-term-next months to a few months till her PR ES resolves.  She can be down titrated and antiepileptics discontinued if deemed appropriate on outpatient follow-up. Last ABG showed hypercarbia-being managed by PCCM  Impression: Seizure/seizure-like activity for the first time-likely provoked in the setting of acute respiratory illness and based on imaging, probably provoked by posterior reversible encephalopathy syndrome. Toxic metabolic encephalopathy Hypercarbic respiratory failure Hypoxic respiratory  failure Obesity History of tobacco abuse  Recommendations: -From a neurological standpoint, I would continue Keppra 500 twice daily-at least in the short-term for another few months.  Follow-up with outpatient neurology for long-term plan on antiepileptics. -I would maintain seizure precautions -I would continue to correct her respiratory and metabolic derangements per primary team as you are -In terms of the posterior reversible encephalopathy syndrome-mainstay of treatment will be blood pressure management.  At this point I would recommend keeping her blood pressures definitively under 160 at all times and gradually normalizing them to a goal at discharge of less than 140/90. Last 24 hours she has been in goal for her blood pressures. -PR ES will improve with time, exam is  already showing improvement with her be able to follow commands even while on sedation. -I would expect this to improve as the respiratory and metabolic derangements are improving.  Discussed my plan in detail with the intensivist on the unit.  Also called patient significant other Mr. Teresa Coombs over the phone and updated him with my assessment and recommendations.  He verbalized understanding and was appreciative of the call and update.  Neurology will be available as needed. Please call with questions. -- Amie Portland, MD [facility] Pager: [phone] If 7pm to 7am, please call on call as listed on AMION.  CRITICAL CARE ATTESTATION Performed by: Amie Portland, MD Total critical care time: 33 minutes Critical care time was exclusive of separately billable procedures and treating other patients and/or supervising APPs/Residents/Students Critical care was necessary to treat or prevent imminent or life-threatening deterioration due to toxic metabolic encephalopathy, seizure activity, posterior reversible encephalopathy syndrome This patient is critically ill and at significant risk for neurological  worsening and/or death and care requires constant monitoring. Critical care was time spent personally by me on the following activities: development of treatment plan with patient and/or surrogate as well as nursing, discussions with consultants, evaluation of patient's response to treatment, examination of patient, obtaining history from patient or surrogate, ordering and performing treatments and interventions, ordering and review of laboratory studies, ordering and review of radiographic studies, pulse oximetry, re-evaluation of patient's condition, participation in multidisciplinary rounds and medical decision making of high complexity in the care of this patient.

## 2019-12-27 NOTE — Progress Notes (Signed)
Name: [name] MRN: [id number] DOB: [birth year]     CONSULTATION DATE: 12/20/2019 Subjective and objective: Afebrile, remains on ventilator and no major issues last night.  PAST MEDICAL HISTORY :   has a past medical history of Anxiety, COPD (chronic obstructive pulmonary disease) ([place]), Essential hypertension, History of echocardiogram, Morbid obesity ([place]), and Tobacco abuse.  has a past surgical history that includes Cholecystectomy; Abdominal hysterectomy; Hernia repair; Extubation (endotracheal) in or (N/A, 08/14/2017); and TEE without cardioversion (N/A, 09/20/2017). Prior to Admission medications   Medication Sig Start Date End Date Taking? Authorizing Provider  ADVAIR HFA 115-21 MCG/ACT inhaler Inhale 2 puffs into the lungs 2 (two) times daily. 11/21/19  Yes [provider]  albuterol (PROVENTIL HFA;VENTOLIN HFA) 108 (90 Base) MCG/ACT inhaler Inhale 2 puffs into the lungs every 6 (six) hours as needed for wheezing or shortness of breath. 06/12/17  Yes Gouru, Aruna, MD  clonazePAM (KLONOPIN) 0.5 MG tablet Take 0.5 mg by mouth 3 (three) times daily as needed for anxiety.    Yes [provider]  furosemide (LASIX) 40 MG tablet Take 40 mg by mouth 2 (two) times daily. 11/21/19  Yes [provider]  ipratropium-albuterol (DUONEB) 0.5-2.5 (3) MG/3ML SOLN Take 3 mLs by nebulization every 6 (six) hours. Patient taking differently: Take 3 mLs by nebulization 2 (two) times daily.  08/17/17  Yes Epifanio Lesches, MD  ipratropium-albuterol (DUONEB) 0.5-2.5 (3) MG/3ML SOLN Inhale 3 mLs into the lungs every 8 (eight) hours as needed. 11/21/19  Yes [provider]  omeprazole (PRILOSEC) 20 MG capsule Take 20 mg by mouth daily.   Yes [provider]  SPIRIVA HANDIHALER 18 MCG inhalation capsule Place 1 capsule into inhaler and inhale daily. 11/21/19  Yes [provider]  cyclobenzaprine (FLEXERIL) 10 MG tablet Take 10 mg by mouth 3 (three) times daily as  needed for muscle spasms.  Patient not taking: Reported on 12/21/2019 08/16/16   [provider]  fluticasone (FLOVENT HFA) 44 MCG/ACT inhaler Inhale 2 puffs into the lungs daily. Patient not taking: Reported on 12/21/2019    [provider]  magic mouthwash w/lidocaine SOLN Take 5 mLs by mouth 4 (four) times daily. Patient not taking: Reported on 12/21/2019 11/16/17   Cuthriell, Charline Bills, PA-C   No Known Allergies  FAMILY HISTORY:  family history includes Dementia in her mother; Heart attack in her father. SOCIAL HISTORY:  reports that she has been smoking cigarettes. She has a 30.00 pack-year smoking history. She has never used smokeless tobacco. She reports that she does not drink alcohol and does not use drugs.  REVIEW OF SYSTEMS:   Unable to obtain due to critical illness      Estimated body mass index is 50.72 kg/m as calculated from the following:   Height as of this encounter: 5' (1.524 m).   Weight as of this encounter: 117.8 kg.    VITAL SIGNS: Temp:  [98.8 F (37.1 C)-99.7 F (37.6 C)] 98.8 F (37.1 C) (08/14 0400) Pulse Rate:  [55-67] 67 (08/14 1200) Resp:  [16-17] 16 (08/14 1200) BP: (97-127)/(57-72) 115/62 (08/14 1200) SpO2:  [90 %-96 %] 92 % (08/14 1200) FiO2 (%):  [40 %] 40 % (08/14 0738) Weight:  [117.8 kg] 117.8 kg (08/14 0445)   I/O last 3 completed shifts: In: 1623.5 [I.V.:712.1; NG/GT:500; IV Piggyback:411.4] Out: 1875 [Urine:1875] No intake/output data recorded.   SpO2: 92 % O2 Flow Rate (L/min): 10 L/min FiO2 (%): 40 %   Physical Examination:  Sedated  to RASS of -3 On vent, no distress, bilateral equal air entry and no adventitious sounds S1 & S2 are audible with no murmur Benign abdominal exam with normal peristalsis No leg edema   CULTURE RESULTS   Recent Results (from the past 240 hour(s))  MRSA PCR Screening     Status: None   Collection Time: 12/20/19  8:08 PM   Specimen: Nasal Mucosa; Nasopharyngeal  Result Value  Ref Range Status   MRSA by PCR NEGATIVE NEGATIVE Final    Comment:        The GeneXpert MRSA Assay (FDA approved for NASAL specimens only), is one component of a comprehensive MRSA colonization surveillance program. It is not intended to diagnose MRSA infection nor to guide or monitor treatment for MRSA infections. Performed at [facility], [address] [place], [place] [address]   SARS Coronavirus 2 by RT PCR (hospital order, performed in [facility] hospital lab) Nasopharyngeal Nasopharyngeal Swab     Status: None   Collection Time: 12/21/19  4:12 AM   Specimen: Nasopharyngeal Swab  Result Value Ref Range Status   SARS Coronavirus 2 NEGATIVE NEGATIVE Final    Comment: (NOTE) SARS-CoV-2 target nucleic acids are NOT DETECTED.  The SARS-CoV-2 RNA is generally detectable in upper and lower respiratory specimens during the acute phase of infection. The lowest concentration of SARS-CoV-2 viral copies this assay can detect is 250 copies / mL. A negative result does not preclude SARS-CoV-2 infection and should not be used as the sole basis for treatment or other patient management decisions.  A negative result may occur with improper specimen collection / handling, submission of specimen other than nasopharyngeal swab, presence of viral mutation(s) within the areas targeted by this assay, and inadequate number of viral copies (<250 copies / mL). A negative result must be combined with clinical observations, patient history, and epidemiological information.  Fact Sheet for Patients:   StrictlyIdeas.no  Fact Sheet for Healthcare Providers: BankingDealers.co.za  This test is not yet approved or  cleared by the [place] FDA and has been authorized for detection and/or diagnosis of SARS-CoV-2 by FDA under an Emergency Use Authorization (EUA).  This EUA will remain in effect (meaning this test can be used) for the  duration of the COVID-19 declaration under Section 564(b)(1) of the Act, 21 U.S.C. section 360bbb-3(b)(1), unless the authorization is terminated or revoked sooner.  Performed at [facility], [address] [place], [place] [address]   Culture, blood (Routine X 2) w Reflex to ID Panel     Status: None   Collection Time: 12/21/19 10:33 AM   Specimen: BLOOD  Result Value Ref Range Status   Specimen Description BLOOD [facility]  Final   Special Requests   Final    BOTTLES DRAWN AEROBIC AND ANAEROBIC Blood Culture adequate volume   Culture   Final    NO GROWTH 5 DAYS Performed at [facility], [address] [place], [place] [address]    Report Status 12/26/2019 FINAL  Final  Culture, blood (Routine X 2) w Reflex to ID Panel     Status: None   Collection Time: 12/21/19 10:45 AM   Specimen: BLOOD  Result Value Ref Range Status   Specimen Description BLOOD [facility]  Final   Special Requests   Final    BOTTLES DRAWN AEROBIC ONLY Blood Culture results may not be optimal due to an inadequate volume of blood received in culture bottles   Culture   Final  NO GROWTH 5 DAYS Performed at [facility], [address] [place], [place] [address]    Report Status 12/26/2019 FINAL  Final  SARS Coronavirus 2 by RT PCR (hospital order, performed in [facility] hospital lab) Nasopharyngeal Nasopharyngeal Swab     Status: None   Collection Time: 12/22/19 11:28 AM   Specimen: Nasopharyngeal Swab  Result Value Ref Range Status   SARS Coronavirus 2 NEGATIVE NEGATIVE Final    Comment: (NOTE) SARS-CoV-2 target nucleic acids are NOT DETECTED.  The SARS-CoV-2 RNA is generally detectable in upper and lower respiratory specimens during the acute phase of infection. The lowest concentration of SARS-CoV-2 viral copies this assay can detect is 250 copies / mL. A negative result does not preclude SARS-CoV-2 infection and should not be used as the sole basis for treatment or  other patient management decisions.  A negative result may occur with improper specimen collection / handling, submission of specimen other than nasopharyngeal swab, presence of viral mutation(s) within the areas targeted by this assay, and inadequate number of viral copies (<250 copies / mL). A negative result must be combined with clinical observations, patient history, and epidemiological information.  Fact Sheet for Patients:   StrictlyIdeas.no  Fact Sheet for Healthcare Providers: BankingDealers.co.za  This test is not yet approved or  cleared by the [place] FDA and has been authorized for detection and/or diagnosis of SARS-CoV-2 by FDA under an Emergency Use Authorization (EUA).  This EUA will remain in effect (meaning this test can be used) for the duration of the COVID-19 declaration under Section 564(b)(1) of the Act, 21 U.S.C. section 360bbb-3(b)(1), unless the authorization is terminated or revoked sooner.  Performed at [facility], [address] [place], [place] [address]           IMAGING    MR BRAIN W WO CONTRAST  Result Date: 12/27/2019 CLINICAL DATA:  Initial evaluation for acute seizure, history of COPD with current acute respiratory failure and hypercapnia. EXAM: MRI HEAD WITHOUT AND WITH CONTRAST TECHNIQUE: Multiplanar, multiecho pulse sequences of the brain and surrounding structures were obtained without and with intravenous contrast. CONTRAST:  64m GADAVIST GADOBUTROL 1 MMOL/ML IV SOLN COMPARISON:  Prior head CT from 12/26/2019. FINDINGS: Brain: Cerebral volume within normal limits for age. Patchy T2/FLAIR hyperintensity seen throughout the periventricular and deep white matter of both cerebral hemispheres, most consistent with chronic microvascular ischemic disease, overall moderate in nature. There are superimposed scattered areas of patchy T2/FLAIR signal abnormality involving the cortical  and subcortical aspects of both frontal and parietal lobes (series 15, image 40, 34), both occipital lobes (series 15, image 20), as well as the cerebellum (series 15, image 12). Scattered areas of associated mild diffusion abnormality (series 6, images 36, 19, 17, 10). Appearance is most consistent with PRES, consistent with history of seizure. No significant associated susceptibility artifact to suggest hemorrhage. No associated enhancement. No evidence for acute or subacute infarct elsewhere within the brain. No encephalomalacia to suggest chronic cortical infarction. No definite acute or chronic intracranial hemorrhage. Diffuse prominence of the intracranial vasculature on SWI sequence suspected to be related to prior iron replacement therapy. No mass lesion, midline shift or mass effect. No hydrocephalus or extra-axial fluid collection. No intrinsic temporal lobe abnormality. No other abnormal enhancement. 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 Midline structures intact. Vascular: Major intracranial vascular flow voids are maintained. Skull and upper cervical spine: Craniocervical junction within normal limits. Visualized upper cervical spine normal. Diffusely decreased T1 signal  intensity seen throughout the visualized bone marrow, likely related to patient's history of anemia. No focal marrow replacing lesion. No scalp soft tissue abnormality. Sinuses/Orbits: Globes and orbital soft tissues within normal limits. Scattered mucosal thickening noted throughout the paranasal sinuses, most pronounced within the sphenoid sinuses. Prominent bilateral mastoid effusions. Fluid seen layering within the nasopharynx. Patient appears to be intubated. Other: None. IMPRESSION: 1. Patchy T2/FLAIR signal abnormality involving the cortical and subcortical aspects of both frontal and parietal lobes as well as the cerebellum, most consistent with PRES. 2. No other acute intracranial abnormality. 3. Underlying moderate chronic  microvascular ischemic disease. Electronically Signed   By: Jeannine Boga M.D.   On: 12/27/2019 02:49     Nutrition Status: Nutrition Problem: Inadequate oral intake Etiology: inability to eat (pt sedated and ventilated) Signs/Symptoms: NPO status   Assessment and plan:  Acute on Chronic Hypoxic Hypercapnic Respiratory Failure in setting of Acute COPD Exacerbation and Pneumonia Hx: COPD on 4 L/min home O2 nasal cannula -Full vent support -Wean FiO2 and PEEP as tolerated to maintain O2 saturations 88 to 92% -Daily spontaneous breathing trials when respiratory parameters met -Failed SBT 12/25/2019 -Scheduled Bronchodilators -Inhaled steroids -Optimize systemic steroids -Noted to have pulmonary edema on chest x-ray 8/12/1, will give 40 mg IV Lasix x1 dose -Patient's family has requested ENT consult for tracheostomy placement following several failed weans due to previous prolonged and traumatic endotracheal intubation. Have contacted Dr. Pryor Ochoa. Tentative plan for possible trach on Tuesday 12/30/19   Pneumonia.  Bilateral airspace disease (improved) -Monitor fever curve -Trend WBCs and procalcitonin -Follow cultures as above -Continue azithromycin and ceftriaxone -Has tested negative for COVID-19 x3  New onset seizure.  EEG reported severe diffuse encephalopathy, could be related to sedation, no seizure or definite epileptiform discharges. CT head negative for acute intracranial abnormality. MRI brain with and without contrast 12/27/2019: Signal abnormality consistent with PRES. no acute intracranial abnormality, moderate chronic microvascular ischemic disease and no acute intracranial abnormality. -Keppra -MRI of head -Follow with neurology recommendations.  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  DVT&GI prophylaxis. Continue supportive care.  Mrs. Kilgore daughter was updated at the bedside with the patient progress, all questions were asked and she agreed with the plan of management. Critical care time 35 minutes

## 2019-12-28 ENCOUNTER — Inpatient Hospital Stay: Payer: Medicaid Other

## 2019-12-28 DIAGNOSIS — J9601 Acute respiratory failure with hypoxia: Secondary | ICD-10-CM | POA: Diagnosis not present

## 2019-12-28 DIAGNOSIS — J9602 Acute respiratory failure with hypercapnia: Secondary | ICD-10-CM | POA: Diagnosis not present

## 2019-12-28 LAB — CBC WITH DIFFERENTIAL/PLATELET
Abs Immature Granulocytes: 0.03 10*3/uL (ref 0.00–0.07)
Basophils Absolute: 0 10*3/uL (ref 0.0–0.1)
Basophils Relative: 0 %
Eosinophils Absolute: 0.1 10*3/uL (ref 0.0–0.5)
Eosinophils Relative: 1 %
HCT: 51 % — ABNORMAL HIGH (ref 36.0–46.0)
Hemoglobin: 15.1 g/dL — ABNORMAL HIGH (ref 12.0–15.0)
Immature Granulocytes: 0 %
Lymphocytes Relative: 17 %
Lymphs Abs: 1.2 10*3/uL (ref 0.7–4.0)
MCH: 24 pg — ABNORMAL LOW (ref 26.0–34.0)
MCHC: 29.6 g/dL — ABNORMAL LOW (ref 30.0–36.0)
MCV: 81.1 fL (ref 80.0–100.0)
Monocytes Absolute: 1 10*3/uL (ref 0.1–1.0)
Monocytes Relative: 14 %
Neutro Abs: 5 10*3/uL (ref 1.7–7.7)
Neutrophils Relative %: 68 %
Platelets: 228 10*3/uL (ref 150–400)
RBC: 6.29 MIL/uL — ABNORMAL HIGH (ref 3.87–5.11)
RDW: 21.4 % — ABNORMAL HIGH (ref 11.5–15.5)
WBC: 7.3 10*3/uL (ref 4.0–10.5)
nRBC: 0 % (ref 0.0–0.2)

## 2019-12-28 LAB — BASIC METABOLIC PANEL
Anion gap: 10 (ref 5–15)
BUN: 33 mg/dL — ABNORMAL HIGH (ref 6–20)
CO2: 32 mmol/L (ref 22–32)
Calcium: 8.4 mg/dL — ABNORMAL LOW (ref 8.9–10.3)
Chloride: 97 mmol/L — ABNORMAL LOW (ref 98–111)
Creatinine, Ser: 0.44 mg/dL (ref 0.44–1.00)
GFR calc Af Amer: >60 mL/min (ref >60)
GFR calc non Af Amer: >60 mL/min (ref >60)
Glucose, Bld: 119 mg/dL — ABNORMAL HIGH (ref 70–99)
Potassium: 3.8 mmol/L (ref 3.5–5.1)
Sodium: 139 mmol/L (ref 135–145)

## 2019-12-28 LAB — HEPATIC FUNCTION PANEL
ALT: 203 U/L — ABNORMAL HIGH (ref 0–44)
AST: 256 U/L — ABNORMAL HIGH (ref 15–41)
Albumin: 2.9 g/dL — ABNORMAL LOW (ref 3.5–5.0)
Alkaline Phosphatase: 69 U/L (ref 38–126)
Bilirubin, Direct: 0.7 mg/dL — ABNORMAL HIGH (ref 0.0–0.2)
Indirect Bilirubin: 1.4 mg/dL — ABNORMAL HIGH (ref 0.3–0.9)
Total Bilirubin: 2.1 mg/dL — ABNORMAL HIGH (ref 0.3–1.2)
Total Protein: 5.8 g/dL — ABNORMAL LOW (ref 6.5–8.1)

## 2019-12-28 LAB — GLUCOSE, CAPILLARY
Glucose-Capillary: 102 mg/dL — ABNORMAL HIGH (ref 70–99)
Glucose-Capillary: 130 mg/dL — ABNORMAL HIGH (ref 70–99)
Glucose-Capillary: 135 mg/dL — ABNORMAL HIGH (ref 70–99)
Glucose-Capillary: 140 mg/dL — ABNORMAL HIGH (ref 70–99)
Glucose-Capillary: 156 mg/dL — ABNORMAL HIGH (ref 70–99)
Glucose-Capillary: 187 mg/dL — ABNORMAL HIGH (ref 70–99)

## 2019-12-28 LAB — MAGNESIUM: Magnesium: 2 mg/dL (ref 1.7–2.4)

## 2019-12-28 LAB — PHOSPHORUS: Phosphorus: 3.2 mg/dL (ref 2.5–4.6)

## 2019-12-28 LAB — LIPASE, BLOOD: Lipase: 27 U/L (ref 11–51)

## 2019-12-28 MED ORDER — CHLORHEXIDINE GLUCONATE 0.12 % MT SOLN
OROMUCOSAL | Status: AC
  Administered 2019-12-28: 15 mL via OROMUCOSAL
  Filled 2019-12-28: qty 15

## 2019-12-28 NOTE — Progress Notes (Signed)
Name: [name] MRN: [id number] DOB: November 16, 1965     CONSULTATION DATE: 12/20/2019 Subjective and objective: Afebrile, on ventilator no major events last night.  PAST MEDICAL HISTORY :   has a past medical history of Anxiety, COPD (chronic obstructive pulmonary disease) ([place]), Essential hypertension, History of echocardiogram, Morbid obesity ([place]), and Tobacco abuse.  has a past surgical history that includes Cholecystectomy; Abdominal hysterectomy; Hernia repair; Extubation (endotracheal) in or (N/A, 08/14/2017); and TEE without cardioversion (N/A, 09/20/2017). Prior to Admission medications   Medication Sig Start Date End Date Taking? Authorizing Provider  ADVAIR HFA 115-21 MCG/ACT inhaler Inhale 2 puffs into the lungs 2 (two) times daily. 11/21/19  Yes [provider]  albuterol (PROVENTIL HFA;VENTOLIN HFA) 108 (90 Base) MCG/ACT inhaler Inhale 2 puffs into the lungs every 6 (six) hours as needed for wheezing or shortness of breath. 06/12/17  Yes Gouru, Aruna, MD  clonazePAM (KLONOPIN) 0.5 MG tablet Take 0.5 mg by mouth 3 (three) times daily as needed for anxiety.    Yes [provider]  furosemide (LASIX) 40 MG tablet Take 40 mg by mouth 2 (two) times daily. 11/21/19  Yes [provider]  ipratropium-albuterol (DUONEB) 0.5-2.5 (3) MG/3ML SOLN Take 3 mLs by nebulization every 6 (six) hours. Patient taking differently: Take 3 mLs by nebulization 2 (two) times daily.  08/17/17  Yes Epifanio Lesches, MD  ipratropium-albuterol (DUONEB) 0.5-2.5 (3) MG/3ML SOLN Inhale 3 mLs into the lungs every 8 (eight) hours as needed. 11/21/19  Yes [provider]  omeprazole (PRILOSEC) 20 MG capsule Take 20 mg by mouth daily.   Yes [provider]  SPIRIVA HANDIHALER 18 MCG inhalation capsule Place 1 capsule into inhaler and inhale daily. 11/21/19  Yes [provider]  cyclobenzaprine (FLEXERIL) 10 MG tablet Take 10 mg by mouth 3 (three) times daily as needed for  muscle spasms.  Patient not taking: Reported on 12/21/2019 08/16/16   [provider]  fluticasone (FLOVENT HFA) 44 MCG/ACT inhaler Inhale 2 puffs into the lungs daily. Patient not taking: Reported on 12/21/2019    [provider]  magic mouthwash w/lidocaine SOLN Take 5 mLs by mouth 4 (four) times daily. Patient not taking: Reported on 12/21/2019 11/16/17   Cuthriell, Charline Bills, PA-C   No Known Allergies  FAMILY HISTORY:  family history includes Dementia in her mother; Heart attack in her father. SOCIAL HISTORY:  reports that she has been smoking cigarettes. She has a 30.00 pack-year smoking history. She has never used smokeless tobacco. She reports that she does not drink alcohol and does not use drugs.  REVIEW OF SYSTEMS:   Unable to obtain due to critical illness      Estimated body mass index is 51.41 kg/m as calculated from the following:   Height as of this encounter: 5' (1.524 m).   Weight as of this encounter: 119.4 kg.    VITAL SIGNS: Temp:  [98 F (36.7 C)-99.1 F (37.3 C)] 99 F (37.2 C) (08/15 0800) Pulse Rate:  [60-127] 80 (08/15 1100) Resp:  [12-22] 13 (08/15 1100) BP: (99-159)/(57-92) 122/67 (08/15 1100) SpO2:  [94 %-100 %] 95 % (08/15 1100) FiO2 (%):  [40 %] 40 % (08/15 0810) Weight:  [119.4 kg] 119.4 kg (08/15 0435)   I/O last 3 completed shifts: In: 907.3 [I.V.:507.3; IV Piggyback:400] Out: 7425 [Urine:1650] Total I/O In: 457.9 [I.V.:307.9; IV Piggyback:150] Out: 400 [Urine:400]   SpO2: 95 % O2 Flow Rate (L/min): 10 L/min FiO2 (%): 40 %   Physical  Examination:  Sedated to RASS of -3.  Follows simple command on light sedation On vent, no distress, bilateral equal air entry and no adventitious sounds S1 & S2 are audible with no murmur Benign abdominal exam with normal peristalsis No leg edema  CULTURE RESULTS   Recent Results (from the past 240 hour(s))  MRSA PCR Screening     Status: None   Collection Time: 12/20/19  8:08 PM     Specimen: Nasal Mucosa; Nasopharyngeal  Result Value Ref Range Status   MRSA by PCR NEGATIVE NEGATIVE Final    Comment:        The GeneXpert MRSA Assay (FDA approved for NASAL specimens only), is one component of a comprehensive MRSA colonization surveillance program. It is not intended to diagnose MRSA infection nor to guide or monitor treatment for MRSA infections. Performed at [facility], [address] [place], [place] [address]   SARS Coronavirus 2 by RT PCR (hospital order, performed in [facility] hospital lab) Nasopharyngeal Nasopharyngeal Swab     Status: None   Collection Time: 12/21/19  4:12 AM   Specimen: Nasopharyngeal Swab  Result Value Ref Range Status   SARS Coronavirus 2 NEGATIVE NEGATIVE Final    Comment: (NOTE) SARS-CoV-2 target nucleic acids are NOT DETECTED.  The SARS-CoV-2 RNA is generally detectable in upper and lower respiratory specimens during the acute phase of infection. The lowest concentration of SARS-CoV-2 viral copies this assay can detect is 250 copies / mL. A negative result does not preclude SARS-CoV-2 infection and should not be used as the sole basis for treatment or other patient management decisions.  A negative result may occur with improper specimen collection / handling, submission of specimen other than nasopharyngeal swab, presence of viral mutation(s) within the areas targeted by this assay, and inadequate number of viral copies (<250 copies / mL). A negative result must be combined with clinical observations, patient history, and epidemiological information.  Fact Sheet for Patients:   StrictlyIdeas.no  Fact Sheet for Healthcare Providers: BankingDealers.co.za  This test is not yet approved or  cleared by the [place] FDA and has been authorized for detection and/or diagnosis of SARS-CoV-2 by FDA under an Emergency Use Authorization (EUA).  This EUA will  remain in effect (meaning this test can be used) for the duration of the COVID-19 declaration under Section 564(b)(1) of the Act, 21 U.S.C. section 360bbb-3(b)(1), unless the authorization is terminated or revoked sooner.  Performed at [facility], [address] [place], [place] [address]   Culture, blood (Routine X 2) w Reflex to ID Panel     Status: None   Collection Time: 12/21/19 10:33 AM   Specimen: BLOOD  Result Value Ref Range Status   Specimen Description BLOOD [facility]  Final   Special Requests   Final    BOTTLES DRAWN AEROBIC AND ANAEROBIC Blood Culture adequate volume   Culture   Final    NO GROWTH 5 DAYS Performed at [facility], [address] [place], [place] [address]    Report Status 12/26/2019 FINAL  Final  Culture, blood (Routine X 2) w Reflex to ID Panel     Status: None   Collection Time: 12/21/19 10:45 AM   Specimen: BLOOD  Result Value Ref Range Status   Specimen Description BLOOD [facility]  Final   Special Requests   Final    BOTTLES DRAWN AEROBIC ONLY Blood Culture results may not be optimal due to an inadequate volume of blood received in  culture bottles   Culture   Final    NO GROWTH 5 DAYS Performed at [facility], [address] [place], [place] [address]    Report Status 12/26/2019 FINAL  Final  SARS Coronavirus 2 by RT PCR (hospital order, performed in [facility] hospital lab) Nasopharyngeal Nasopharyngeal Swab     Status: None   Collection Time: 12/22/19 11:28 AM   Specimen: Nasopharyngeal Swab  Result Value Ref Range Status   SARS Coronavirus 2 NEGATIVE NEGATIVE Final    Comment: (NOTE) SARS-CoV-2 target nucleic acids are NOT DETECTED.  The SARS-CoV-2 RNA is generally detectable in upper and lower respiratory specimens during the acute phase of infection. The lowest concentration of SARS-CoV-2 viral copies this assay can detect is 250 copies / mL. A negative result does not preclude SARS-CoV-2 infection and should  not be used as the sole basis for treatment or other patient management decisions.  A negative result may occur with improper specimen collection / handling, submission of specimen other than nasopharyngeal swab, presence of viral mutation(s) within the areas targeted by this assay, and inadequate number of viral copies (<250 copies / mL). A negative result must be combined with clinical observations, patient history, and epidemiological information.  Fact Sheet for Patients:   StrictlyIdeas.no  Fact Sheet for Healthcare Providers: BankingDealers.co.za  This test is not yet approved or  cleared by the [place] FDA and has been authorized for detection and/or diagnosis of SARS-CoV-2 by FDA under an Emergency Use Authorization (EUA).  This EUA will remain in effect (meaning this test can be used) for the duration of the COVID-19 declaration under Section 564(b)(1) of the Act, 21 U.S.C. section 360bbb-3(b)(1), unless the authorization is terminated or revoked sooner.  Performed at [facility], [address] [place], [place] [address]           Assessment and plan:  Acute on Chronic Hypoxic Hypercapnic Respiratory Failure.  Continues to have poor weaning parameter with insufficient respiratory effort, panicking with breathing trial. Acute COPD Exacerbation  ARDS with P/F 200 on 40% PEEP of 2 Pneumonia Hx: COPD on 4 L/min home O2 nasal cannula -Full vent support -Wean FiO2 and PEEP as tolerated to maintain O2 saturations 88 to 92% -Daily spontaneous breathing trials when respiratory parameters met -Failed SBT 12/25/2019 -Scheduled Bronchodilators -Inhaled steroids -Optimize systemic steroids -Noted to have pulmonary edema on chest x-ray 8/12/1, will give 40 mg IV Lasix x1 dose -Patient's family has requested ENT consult for tracheostomy placement following several failed weans due to previous prolonged and  traumatic endotracheal intubation. Have contacted Dr. Pryor Ochoa. Tentative plan for possible trach on Tuesday 12/30/19   Pneumonia.Bilateral airspace disease (improved) -Monitor fever curve -Trend WBCs and procalcitonin -Follow cultures as above -s/p azithromycin and ceftriaxone -Has tested negative for COVID-19 x3  New onset seizure. EEG reported severe diffuse encephalopathy, could be related to sedation, no seizure or definite epileptiform discharges. CT head negative for acute intracranial abnormality. MRI brain with and without contrast 12/27/2019: Signal abnormality consistent with PRES. no acute intracranial abnormality, moderate chronic microvascular ischemic disease and no acute intracranial abnormality. -On Keppra -Management as per neurology.  Prerenal azotemia and hemoconcentration with intravascular volume depletionand secondary catabolic effect of the steroids. (improved). -Optimize hydration,taper off steroids as tolerated, avoid nephrotoxins and monitor renal panel  Hyperglycemia -CBG -SSI -Follow ICU hypor/hyperglycemic protocol  Hypertriglyceridemia due to Propofol. Resolved last TG 86 on 12/27/2019  Elevated LFT.  Elevated transaminases with hepatocellular pattern concerning for acute  hepatitis with coincides with starting Keppra as the patient has normal transaminases on 12/23/2019. -DC Keppra -Monitor LFTs and liver ultrasound.  LT L E DVT was ruled out with venous Doppler  Echo 12/21/2019: LVEF 65 to 70%.  DVT&GI prophylaxis. Continue supportive care.  Critical care time 35 minutes

## 2019-12-28 NOTE — Consult Note (Signed)
PHARMACY CONSULT NOTE   Pharmacy Consult for Electrolyte Monitoring and Replacement   Recent Labs: Potassium (mmol/L)  Date Value  12/28/2019 3.8  03/26/2014 3.9   Magnesium (mg/dL)  Date Value  44/07/4740 2.0  03/20/2012 1.5 (L)   Calcium (mg/dL)  Date Value  59/56/3875 8.4 (L)   Calcium, Total (mg/dL)  Date Value  64/33/2951 8.5   Albumin (g/dL)  Date Value  88/41/6606 2.9 (L)  03/26/2014 3.2 (L)   Phosphorus (mg/dL)  Date Value  30/16/0109 3.2   Sodium (mmol/L)  Date Value  12/28/2019 139  03/26/2014 142   Corrected Ca: 8.6 mg/dL  Assessment: [name] Is 54 y.o female with PMH significant for has a past medical history of Anxiety, COPD, HTN, morbid obesity, and tobacco abuse presented with progressive shortness of breath and fatigue x3 days, found to be in acute hypercarbic and hypoxic respiratory failure; failed BiPAP and had to be emergently intubated; failed SBT 8/11. Patient with seizure like activity, now on Keppra with Neurology following.   Goal of Therapy:  Potassium 4.0 - 5.1 mmol/L Magnesium 2.0 - 2.4 mg/dL All Other Electrolytes WNL  Plan:  No electrolyte replacement indicated at this time. Continue to follow along.  Pricilla Riffle, PharmD 12/28/2019 7:45 AM

## 2019-12-29 ENCOUNTER — Inpatient Hospital Stay: Payer: Medicaid Other

## 2019-12-29 DIAGNOSIS — J1282 Pneumonia due to coronavirus disease 2019: Secondary | ICD-10-CM

## 2019-12-29 DIAGNOSIS — U071 COVID-19: Secondary | ICD-10-CM

## 2019-12-29 DIAGNOSIS — J9601 Acute respiratory failure with hypoxia: Secondary | ICD-10-CM | POA: Diagnosis not present

## 2019-12-29 LAB — COMPREHENSIVE METABOLIC PANEL
ALT: 153 U/L — ABNORMAL HIGH (ref 0–44)
AST: 33 U/L (ref 15–41)
Albumin: 3.4 g/dL — ABNORMAL LOW (ref 3.5–5.0)
Alkaline Phosphatase: 73 U/L (ref 38–126)
Anion gap: 9 (ref 5–15)
BUN: 29 mg/dL — ABNORMAL HIGH (ref 6–20)
CO2: 31 mmol/L (ref 22–32)
Calcium: 8.9 mg/dL (ref 8.9–10.3)
Chloride: 95 mmol/L — ABNORMAL LOW (ref 98–111)
Creatinine, Ser: 0.55 mg/dL (ref 0.44–1.00)
GFR calc Af Amer: >60 mL/min (ref >60)
GFR calc non Af Amer: >60 mL/min (ref >60)
Glucose, Bld: 164 mg/dL — ABNORMAL HIGH (ref 70–99)
Potassium: 4.5 mmol/L (ref 3.5–5.1)
Sodium: 135 mmol/L (ref 135–145)
Total Bilirubin: 2 mg/dL — ABNORMAL HIGH (ref 0.3–1.2)
Total Protein: 6.4 g/dL — ABNORMAL LOW (ref 6.5–8.1)

## 2019-12-29 LAB — CBC WITH DIFFERENTIAL/PLATELET
Abs Immature Granulocytes: 0.03 10*3/uL (ref 0.00–0.07)
Basophils Absolute: 0 10*3/uL (ref 0.0–0.1)
Basophils Relative: 0 %
Eosinophils Absolute: 0 10*3/uL (ref 0.0–0.5)
Eosinophils Relative: 0 %
HCT: 51.7 % — ABNORMAL HIGH (ref 36.0–46.0)
Hemoglobin: 15.4 g/dL — ABNORMAL HIGH (ref 12.0–15.0)
Immature Granulocytes: 0 %
Lymphocytes Relative: 5 %
Lymphs Abs: 0.4 10*3/uL — ABNORMAL LOW (ref 0.7–4.0)
MCH: 24.3 pg — ABNORMAL LOW (ref 26.0–34.0)
MCHC: 29.8 g/dL — ABNORMAL LOW (ref 30.0–36.0)
MCV: 81.4 fL (ref 80.0–100.0)
Monocytes Absolute: 0.3 10*3/uL (ref 0.1–1.0)
Monocytes Relative: 4 %
Neutro Abs: 6.7 10*3/uL (ref 1.7–7.7)
Neutrophils Relative %: 91 %
Platelets: 233 10*3/uL (ref 150–400)
RBC: 6.35 MIL/uL — ABNORMAL HIGH (ref 3.87–5.11)
RDW: 21.2 % — ABNORMAL HIGH (ref 11.5–15.5)
WBC: 7.4 10*3/uL (ref 4.0–10.5)
nRBC: 0 % (ref 0.0–0.2)

## 2019-12-29 LAB — GLUCOSE, CAPILLARY
Glucose-Capillary: 179 mg/dL — ABNORMAL HIGH (ref 70–99)
Glucose-Capillary: 130 mg/dL — ABNORMAL HIGH (ref 70–99)
Glucose-Capillary: 103 mg/dL — ABNORMAL HIGH (ref 70–99)
Glucose-Capillary: 152 mg/dL — ABNORMAL HIGH (ref 70–99)
Glucose-Capillary: 110 mg/dL — ABNORMAL HIGH (ref 70–99)

## 2019-12-29 LAB — PHOSPHORUS: Phosphorus: 3.4 mg/dL (ref 2.5–4.6)

## 2019-12-29 LAB — AMMONIA: Ammonia: 15 umol/L (ref 9–35)

## 2019-12-29 LAB — MAGNESIUM: Magnesium: 2.1 mg/dL (ref 1.7–2.4)

## 2019-12-29 NOTE — Progress Notes (Signed)
Patient successfully extubated to Bipap 16/5 and 50% with no complications, per Md request.

## 2019-12-29 NOTE — Plan of Care (Addendum)
Assumed this patient's care at 1500 hrs. Successfully extubated to BiPAP at 1715 hrs. Report given to Edilia Bo., RN.    Problem: Education: Goal: Knowledge of General Education information will improve Description: Including pain rating scale, medication(s)/side effects and non-pharmacologic comfort measures Outcome: Progressing   Problem: Health Behavior/Discharge Planning: Goal: Ability to manage health-related needs will improve Outcome: Progressing   Problem: Clinical Measurements: Goal: Ability to maintain clinical measurements within normal limits will improve Outcome: Progressing Goal: Will remain free from infection Outcome: Progressing Goal: Diagnostic test results will improve Outcome: Progressing Goal: Respiratory complications will improve Outcome: Progressing Goal: Cardiovascular complication will be avoided Outcome: Progressing   Problem: Activity: Goal: Risk for activity intolerance will decrease Outcome: Progressing   Problem: Nutrition: Goal: Adequate nutrition will be maintained Outcome: Progressing   Problem: Coping: Goal: Level of anxiety will decrease Outcome: Progressing   Problem: Elimination: Goal: Will not experience complications related to bowel motility Outcome: Progressing Goal: Will not experience complications related to urinary retention Outcome: Progressing   Problem: Pain Managment: Goal: General experience of comfort will improve Outcome: Progressing   Problem: Safety: Goal: Ability to remain free from injury will improve Outcome: Progressing   Problem: Skin Integrity: Goal: Risk for impaired skin integrity will decrease Outcome: Progressing

## 2019-12-29 NOTE — Progress Notes (Signed)
Call placed to [facility] Donor reference # [id number]  via representative Erasmo Score. Patient well over GCS at this time though referral placed for call back.

## 2019-12-29 NOTE — Plan of Care (Signed)
PMT note:  Per CCM, no need to see, goals set.

## 2019-12-29 NOTE — Progress Notes (Signed)
The Clinical status was relayed to family in detail. Son and daughter at bedside  Updated and notified of patients medical condition.  patient with increased WOB and using accessory muscles to breathe while on vent, They have conveyed to me that patient DOES NOT want he ventilator   Explained to family course of therapy and the modalities     Patient with Progressive COPD with very low chance of meaningful recovery despite all aggressive and optimal medical therapy.   Process associated with suffering.  Family understands the situation.  They have consented and agreed to DNR/DNI  Plan for One way extubation with biPAP and assess resp status and will implement End of Life Policy with visitation  Family are satisfied with Plan of action and management. All questions answered     Lucie Leather, M.D.  [facility]  Medical Director [facility] Medical Director [facility] Cardio-Pulmonary Department

## 2019-12-29 NOTE — Consult Note (Signed)
PHARMACY CONSULT NOTE   Pharmacy Consult for Electrolyte Monitoring and Replacement   Recent Labs: Potassium (mmol/L)  Date Value  12/29/2019 4.5  03/26/2014 3.9   Magnesium (mg/dL)  Date Value  56/38/9373 2.1  03/20/2012 1.5 (L)   Calcium (mg/dL)  Date Value  42/87/6811 8.9   Calcium, Total (mg/dL)  Date Value  57/26/2035 8.5   Albumin (g/dL)  Date Value  59/74/1638 3.4 (L)  03/26/2014 3.2 (L)   Phosphorus (mg/dL)  Date Value  45/36/4680 3.4   Sodium (mmol/L)  Date Value  12/29/2019 135  03/26/2014 142   Corrected Ca: 8.6 mg/dL  Assessment: [name] Is 54 y.o female with PMH significant for has a past medical history of Anxiety, COPD, HTN, morbid obesity, and tobacco abuse presented with progressive shortness of breath and fatigue x3 days, found to be in acute hypercarbic and hypoxic respiratory failure; failed BiPAP and had to be emergently intubated; failed SBT 8/11. Patient with seizure like activity, now on Keppra with Neurology following.   Goal of Therapy:  Potassium 4.0 - 5.1 mmol/L Magnesium 2.0 - 2.4 mg/dL All Other Electrolytes WNL  Plan:  No electrolyte replacement indicated at this time. Continue to follow along.  Pricilla Riffle, PharmD 12/29/2019 2:28 PM

## 2019-12-29 NOTE — Progress Notes (Signed)
CRITICAL CARE NOTE  CC  follow up respiratory failure  SUBJECTIVE Patient remains critically ill Prognosis is guarded   BP 136/86   Pulse 74   Temp 98.4 F (36.9 C) (Axillary)   Resp 16   Ht 5' (1.524 m)   Wt 117.8 kg   SpO2 94%   BMI 50.72 kg/m    I/O last 3 completed shifts: In: 1037.2 [I.V.:437.2; NG/GT:450; IV Piggyback:150] Out: 3195 [Urine:3195] No intake/output data recorded.  SpO2: 94 % O2 Flow Rate (L/min): 10 L/min FiO2 (%): 35 %  Estimated body mass index is 50.72 kg/m as calculated from the following:   Height as of this encounter: 5' (1.524 m).   Weight as of this encounter: 117.8 kg.  SIGNIFICANT EVENTS   REVIEW OF SYSTEMS  PATIENT IS UNABLE TO PROVIDE COMPLETE REVIEW OF SYSTEMS DUE TO SEVERE CRITICAL ILLNESS        PHYSICAL EXAMINATION:  GENERAL:critically ill appearing, +resp distress HEAD: Normocephalic, atraumatic.  EYES: Pupils equal, round, reactive to light.  No scleral icterus.  MOUTH: Moist mucosal membrane. NECK: Supple.  PULMONARY: +rhonchi, +wheezing CARDIOVASCULAR: S1 and S2. Regular rate and rhythm. No murmurs, rubs, or gallops.  GASTROINTESTINAL: Soft, nontender, -distended.  Positive bowel sounds.   MUSCULOSKELETAL: No swelling, clubbing, or edema.  NEUROLOGIC: obtunded, GCS<8 SKIN:intact,warm,dry  MEDICATIONS: I have reviewed all medications and confirmed regimen as documented   CULTURE RESULTS   Recent Results (from the past 240 hour(s))  MRSA PCR Screening     Status: None   Collection Time: 12/20/19  8:08 PM   Specimen: Nasal Mucosa; Nasopharyngeal  Result Value Ref Range Status   MRSA by PCR NEGATIVE NEGATIVE Final    Comment:        The GeneXpert MRSA Assay (FDA approved for NASAL specimens only), is one component of a comprehensive MRSA colonization surveillance program. It is not intended to diagnose MRSA infection nor to guide or monitor treatment for MRSA infections. Performed at [facility], [address] [place], [place] [address]   SARS Coronavirus 2 by RT PCR (hospital order, performed in [facility] hospital lab) Nasopharyngeal Nasopharyngeal Swab     Status: None   Collection Time: 12/21/19  4:12 AM   Specimen: Nasopharyngeal Swab  Result Value Ref Range Status   SARS Coronavirus 2 NEGATIVE NEGATIVE Final    Comment: (NOTE) SARS-CoV-2 target nucleic acids are NOT DETECTED.  The SARS-CoV-2 RNA is generally detectable in upper and lower respiratory specimens during the acute phase of infection. The lowest concentration of SARS-CoV-2 viral copies this assay can detect is 250 copies / mL. A negative result does not preclude SARS-CoV-2 infection and should not be used as the sole basis for treatment or other patient management decisions.  A negative result may occur with improper specimen collection / handling, submission of specimen other than nasopharyngeal swab, presence of viral mutation(s) within the areas targeted by this assay, and inadequate number of viral copies (<250 copies / mL). A negative result must be combined with clinical observations, patient history, and epidemiological information.  Fact Sheet for Patients:   StrictlyIdeas.no  Fact Sheet for Healthcare Providers: BankingDealers.co.za  This test is not yet approved or  cleared by the [place] FDA and has been authorized for detection and/or diagnosis of SARS-CoV-2 by FDA under an Emergency Use Authorization (EUA).  This EUA will remain in effect (meaning this test can be used) for the duration of the COVID-19 declaration under Section 564(b)(1) of the Act,  21 U.S.C. section 360bbb-3(b)(1), unless the authorization is terminated or revoked sooner.  Performed at [facility], [address] [place], [place] [address]   Culture, blood (Routine X 2) w Reflex to ID Panel     Status: None   Collection Time: 12/21/19 10:33  AM   Specimen: BLOOD  Result Value Ref Range Status   Specimen Description BLOOD [facility]  Final   Special Requests   Final    BOTTLES DRAWN AEROBIC AND ANAEROBIC Blood Culture adequate volume   Culture   Final    NO GROWTH 5 DAYS Performed at [facility], [address] [place], [place] [address]    Report Status 12/26/2019 FINAL  Final  Culture, blood (Routine X 2) w Reflex to ID Panel     Status: None   Collection Time: 12/21/19 10:45 AM   Specimen: BLOOD  Result Value Ref Range Status   Specimen Description BLOOD [facility]  Final   Special Requests   Final    BOTTLES DRAWN AEROBIC ONLY Blood Culture results may not be optimal due to an inadequate volume of blood received in culture bottles   Culture   Final    NO GROWTH 5 DAYS Performed at [facility], [address], [place], [place] [address]    Report Status 12/26/2019 FINAL  Final  SARS Coronavirus 2 by RT PCR (hospital order, performed in [facility] hospital lab) Nasopharyngeal Nasopharyngeal Swab     Status: None   Collection Time: 12/22/19 11:28 AM   Specimen: Nasopharyngeal Swab  Result Value Ref Range Status   SARS Coronavirus 2 NEGATIVE NEGATIVE Final    Comment: (NOTE) SARS-CoV-2 target nucleic acids are NOT DETECTED.  The SARS-CoV-2 RNA is generally detectable in upper and lower respiratory specimens during the acute phase of infection. The lowest concentration of SARS-CoV-2 viral copies this assay can detect is 250 copies / mL. A negative result does not preclude SARS-CoV-2 infection and should not be used as the sole basis for treatment or other patient management decisions.  A negative result may occur with improper specimen collection / handling, submission of specimen other than nasopharyngeal swab, presence of viral mutation(s) within the areas targeted by this assay, and inadequate number of viral copies (<250 copies / mL). A negative result must be combined with clinical observations,  patient history, and epidemiological information.  Fact Sheet for Patients:   StrictlyIdeas.no  Fact Sheet for Healthcare Providers: BankingDealers.co.za  This test is not yet approved or  cleared by the [place] FDA and has been authorized for detection and/or diagnosis of SARS-CoV-2 by FDA under an Emergency Use Authorization (EUA).  This EUA will remain in effect (meaning this test can be used) for the duration of the COVID-19 declaration under Section 564(b)(1) of the Act, 21 U.S.C. section 360bbb-3(b)(1), unless the authorization is terminated or revoked sooner.  Performed at [facility], [address] [place],  [address]           IMAGING    DG Chest Port 1 View  Result Date: 12/29/2019 CLINICAL DATA:  Pneumonia EXAM: PORTABLE CHEST 1 VIEW COMPARISON:  12/26/2018 FINDINGS: Endotracheal tube NG tube unchanged. Stable cardiac silhouette. There is bibasilar airspace disease similar comparison exam. Mild central venous congestion. IMPRESSION: 1. No interval change. 2. Stable support apparatus. 3. Dense bibasilar airspace disease. Electronically Signed   By: Suzy Bouchard M.D.   On: 12/29/2019 07:52   US Abdomen Limited RUQ  Result Date: 12/28/2019 CLINICAL  DATA:  Steatosis with history of prior cholecystectomy. EXAM: ULTRASOUND ABDOMEN LIMITED RIGHT UPPER QUADRANT COMPARISON:  None. FINDINGS: Gallbladder: The gallbladder is surgically absent. Common bile duct: Diameter: 12.4 mm Liver: No focal lesion identified. There is diffusely increased echogenicity of the liver parenchyma. Portal vein is patent on color Doppler imaging with normal direction of blood flow towards the liver. Other: A small amount of free fluid is noted within the right lower quadrant. IMPRESSION: 1. Findings consistent with prior cholecystectomy. 2. Fatty liver. 3. Small amount of right lower quadrant free fluid. Electronically Signed    By: Virgina Norfolk M.D.   On: 12/28/2019 16:33     Nutrition Status: Nutrition Problem: Inadequate oral intake Etiology: inability to eat (pt sedated and ventilated) Signs/Symptoms: NPO status    BMP Latest Ref Rng & Units 12/29/2019 12/28/2019 12/27/2019  Glucose 70 - 99 mg/dL 164(H) 119(H) 135(H)  BUN 6 - 20 mg/dL 29(H) 33(H) 34(H)  Creatinine 0.44 - 1.00 mg/dL 0.55 0.44 0.65  Sodium 135 - 145 mmol/L 135 139 135  Potassium 3.5 - 5.1 mmol/L 4.5 3.8 4.1  Chloride 98 - 111 mmol/L 95(L) 97(L) 96(L)  CO2 22 - 32 mmol/L 31 32 32  Calcium 8.9 - 10.3 mg/dL 8.9 8.4(L) 8.5(L)      Indwelling Urinary Catheter continued, requirement due to   Reason to continue Indwelling Urinary Catheter strict Intake/Output monitoring for hemodynamic instability         Ventilator continued, requirement due to severe respiratory failure   Ventilator Sedation RASS 0 to -2      ASSESSMENT AND PLAN SYNOPSIS   Severe ACUTE Hypoxic and Hypercapnic Respiratory Failure due to severe COPD exacerbation -continue Full MV support -continue Bronchodilator Therapy -Wean Fio2 and PEEP as tolerated -will perform SAT/SBT when respiratory parameters are met -VAP/VENT bundle implementation   obesity, possible OSA.   Will certainly impact respiratory mechanics, ventilator weaning Suspect will need to consider additional PEEP    NEUROLOGY - intubated and sedated - minimal sedation to achieve a RASS goal: -1 Wake up assessment pending  CARDIAC ICU monitoring  ID -continue IV abx as prescibed -follow up cultures  GI GI PROPHYLAXIS as indicated  NUTRITIONAL STATUS Nutrition Status: Nutrition Problem: Inadequate oral intake Etiology: inability to eat (pt sedated and ventilated) Signs/Symptoms: NPO status     DIET-->TF's as tolerated Constipation protocol as indicated  ENDO - will use ICU hypoglycemic\Hyperglycemia protocol if indicated     ELECTROLYTES -follow labs as  needed -replace as needed -pharmacy consultation and following   DVT/GI PRX ordered and assessed TRANSFUSIONS AS NEEDED MONITOR FSBS I Assessed the need for Labs I Assessed the need for Foley I Assessed the need for Central Venous Line Family Discussion when available I Assessed the need for Mobilization I made an Assessment of medications to be adjusted accordingly Safety Risk assessment completed   CASE DISCUSSED IN MULTIDISCIPLINARY ROUNDS WITH ICU TEAM  Critical Care Time devoted to patient care services described in this note is 32 minutes.   Overall, patient is critically ill, prognosis is guarded.  Patient with Multiorgan failure and at high risk for cardiac arrest and death.   Family to come by to visit and assess for trial of one way extubation   Corrin Parker, M.D.  [facility]  Medical Director [facility] Director [facility] Cardio-Pulmonary Department

## 2019-12-29 NOTE — Progress Notes (Addendum)
   12/29/19 1640  Clinical Encounter Type  Visited With Patient and family together  Visit Type Initial  Referral From Chaplain  Consult/Referral To Chaplain  While entering ICU, chaplain noticed a lot of people standing in the hall and asked if this was one family and they said yes. Chaplain introduced herself and told them she would be around. Chaplain entered the room and found son at the bedside along with a young lady. A cousin asked chaplain if she would pray for patient before she was extubated and chaplain agreed. Chaplain prayed for peace for son, who was crying and very upset. After chaplain prayed, the cousin left and others rotated in and out of the room. Ard 5:15 respiratory therapist took patient off the ventilator. She told chaplain that patient was breathing on her own and her vitals looked good. When leaving ICU chaplain told the family in the waiting area that patient was breathing on her own. Chaplain has been in again ard 5:50 checking on patient and family. Chaplain will follow up later this evening.

## 2019-12-30 ENCOUNTER — Encounter
Admission: EM | Disposition: A | Discharge status: Home Health | Payer: Self-pay | Source: Home / Self Care | Attending: Internal Medicine

## 2019-12-30 ENCOUNTER — Encounter: Payer: Self-pay | Admitting: Anesthesiology

## 2019-12-30 DIAGNOSIS — J441 Chronic obstructive pulmonary disease with (acute) exacerbation: Principal | ICD-10-CM

## 2019-12-30 DIAGNOSIS — J9601 Acute respiratory failure with hypoxia: Secondary | ICD-10-CM | POA: Diagnosis not present

## 2019-12-30 LAB — COMPREHENSIVE METABOLIC PANEL
ALT: 131 U/L — ABNORMAL HIGH (ref 0–44)
AST: 30 U/L (ref 15–41)
Albumin: 3.9 g/dL (ref 3.5–5.0)
Alkaline Phosphatase: 82 U/L (ref 38–126)
Anion gap: 15 (ref 5–15)
BUN: 25 mg/dL — ABNORMAL HIGH (ref 6–20)
CO2: 30 mmol/L (ref 22–32)
Calcium: 10.1 mg/dL (ref 8.9–10.3)
Chloride: 93 mmol/L — ABNORMAL LOW (ref 98–111)
Creatinine, Ser: 0.43 mg/dL — ABNORMAL LOW (ref 0.44–1.00)
GFR calc Af Amer: >60 mL/min (ref >60)
GFR calc non Af Amer: >60 mL/min (ref >60)
Glucose, Bld: 173 mg/dL — ABNORMAL HIGH (ref 70–99)
Potassium: 5.3 mmol/L — ABNORMAL HIGH (ref 3.5–5.1)
Sodium: 138 mmol/L (ref 135–145)
Total Bilirubin: 2.3 mg/dL — ABNORMAL HIGH (ref 0.3–1.2)
Total Protein: 7.7 g/dL (ref 6.5–8.1)

## 2019-12-30 LAB — CBC WITH DIFFERENTIAL/PLATELET
Abs Immature Granulocytes: 0.05 10*3/uL (ref 0.00–0.07)
Basophils Absolute: 0 10*3/uL (ref 0.0–0.1)
Basophils Relative: 0 %
Eosinophils Absolute: 0 10*3/uL (ref 0.0–0.5)
Eosinophils Relative: 0 %
HCT: 57.6 % — ABNORMAL HIGH (ref 36.0–46.0)
Hemoglobin: 16.4 g/dL — ABNORMAL HIGH (ref 12.0–15.0)
Immature Granulocytes: 1 %
Lymphocytes Relative: 4 %
Lymphs Abs: 0.4 10*3/uL — ABNORMAL LOW (ref 0.7–4.0)
MCH: 24.1 pg — ABNORMAL LOW (ref 26.0–34.0)
MCHC: 28.5 g/dL — ABNORMAL LOW (ref 30.0–36.0)
MCV: 84.6 fL (ref 80.0–100.0)
Monocytes Absolute: 0.4 10*3/uL (ref 0.1–1.0)
Monocytes Relative: 5 %
Neutro Abs: 8.5 10*3/uL — ABNORMAL HIGH (ref 1.7–7.7)
Neutrophils Relative %: 90 %
Platelets: 315 10*3/uL (ref 150–400)
RBC: 6.81 MIL/uL — ABNORMAL HIGH (ref 3.87–5.11)
RDW: 21.1 % — ABNORMAL HIGH (ref 11.5–15.5)
Smear Review: NORMAL
WBC: 9.4 10*3/uL (ref 4.0–10.5)
nRBC: 0 % (ref 0.0–0.2)

## 2019-12-30 LAB — PHOSPHORUS: Phosphorus: 4.2 mg/dL (ref 2.5–4.6)

## 2019-12-30 LAB — GLUCOSE, CAPILLARY
Glucose-Capillary: 129 mg/dL — ABNORMAL HIGH (ref 70–99)
Glucose-Capillary: 150 mg/dL — ABNORMAL HIGH (ref 70–99)
Glucose-Capillary: 191 mg/dL — ABNORMAL HIGH (ref 70–99)
Glucose-Capillary: 197 mg/dL — ABNORMAL HIGH (ref 70–99)

## 2019-12-30 LAB — POTASSIUM: Potassium: 4.4 mmol/L (ref 3.5–5.1)

## 2019-12-30 LAB — MAGNESIUM: Magnesium: 1.9 mg/dL (ref 1.7–2.4)

## 2019-12-30 SURGERY — CREATION, TRACHEOSTOMY
Anesthesia: General

## 2019-12-30 MED ORDER — DEXTROSE 50 % IV SOLN
1.0000 | Freq: Once | INTRAVENOUS | Status: AC
  Administered 2019-12-30: 50 mL via INTRAVENOUS
  Filled 2019-12-30: qty 50

## 2019-12-30 MED ORDER — MORPHINE SULFATE (PF) 2 MG/ML IV SOLN
2.0000 mg | INTRAVENOUS | Status: DC | PRN
  Administered 2019-12-31: 2 mg via INTRAVENOUS
  Filled 2019-12-30: qty 1

## 2019-12-30 MED ORDER — GLYCOPYRROLATE 1 MG PO TABS
1.0000 mg | ORAL_TABLET | ORAL | Status: DC | PRN
  Filled 2019-12-30: qty 1

## 2019-12-30 MED ORDER — POLYVINYL ALCOHOL 1.4 % OP SOLN
1.0000 [drp] | Freq: Four times a day (QID) | OPHTHALMIC | Status: DC | PRN
  Filled 2019-12-30: qty 15

## 2019-12-30 MED ORDER — DEXTROSE 5 % IV SOLN: INTRAVENOUS | Status: DC

## 2019-12-30 MED ORDER — DIPHENHYDRAMINE HCL 50 MG/ML IJ SOLN
25.0000 mg | INTRAMUSCULAR | Status: DC | PRN
  Administered 2020-01-01: 25 mg via INTRAVENOUS
  Filled 2019-12-30: qty 1

## 2019-12-30 MED ORDER — ACETAMINOPHEN 650 MG RE SUPP: 650.0000 mg | Freq: Four times a day (QID) | RECTAL | Status: DC | PRN

## 2019-12-30 MED ORDER — ACETAMINOPHEN 325 MG PO TABS: 650.0000 mg | ORAL_TABLET | Freq: Four times a day (QID) | ORAL | Status: DC | PRN

## 2019-12-30 MED ORDER — GLYCOPYRROLATE 0.2 MG/ML IJ SOLN
0.2000 mg | INTRAMUSCULAR | Status: DC | PRN
  Filled 2019-12-30: qty 1

## 2019-12-30 MED ORDER — MORPHINE SULFATE (PF) 2 MG/ML IV SOLN
2.0000 mg | Freq: Once | INTRAVENOUS | Status: AC
  Administered 2019-12-30: 2 mg via INTRAVENOUS
  Filled 2019-12-30: qty 1

## 2019-12-30 MED ORDER — INSULIN REGULAR HUMAN 100 UNIT/ML IJ SOLN
10.0000 [IU] | Freq: Once | INTRAMUSCULAR | Status: AC
  Administered 2019-12-30: 10 [IU] via INTRAVENOUS
  Filled 2019-12-30: qty 10

## 2019-12-30 SURGICAL SUPPLY — 28 items
BLADE SURG 15 STRL LF DISP TIS (BLADE) ×1 IMPLANT
BLADE SURG 15 STRL SS (BLADE) ×2
BLADE SURG SZ11 CARB STEEL (BLADE) ×3 IMPLANT
CANISTER SUCT 1200ML W/VALVE (MISCELLANEOUS) ×3 IMPLANT
COVER WAND RF STERILE (DRAPES) ×3 IMPLANT
ELECT REM PT RETURN 9FT ADLT (ELECTROSURGICAL) ×3
ELECTRODE REM PT RTRN 9FT ADLT (ELECTROSURGICAL) ×1 IMPLANT
GLOVE BIO SURGEON STRL SZ7.5 (GLOVE) ×3 IMPLANT
GOWN STRL REUS W/ TWL LRG LVL3 (GOWN DISPOSABLE) ×2 IMPLANT
GOWN STRL REUS W/TWL LRG LVL3 (GOWN DISPOSABLE) ×4
HEMOSTAT SURGICEL 2X3 (HEMOSTASIS) IMPLANT
HLDR TRACH TUBE NECKBAND 18 (MISCELLANEOUS) ×1 IMPLANT
HOLDER TRACH TUBE NECKBAND 18 (MISCELLANEOUS) ×2
KIT TURNOVER KIT A (KITS) ×3 IMPLANT
LABEL OR SOLS (LABEL) ×3 IMPLANT
NS IRRIG 500ML POUR BTL (IV SOLUTION) ×3 IMPLANT
PACK HEAD/NECK (MISCELLANEOUS) ×3 IMPLANT
SHEARS HARMONIC 9CM CVD (BLADE) ×3 IMPLANT
SPONGE DRAIN TRACH 4X4 STRL 2S (GAUZE/BANDAGES/DRESSINGS) ×3 IMPLANT
SPONGE KITTNER 5P (MISCELLANEOUS) ×3 IMPLANT
SUCTION FRAZIER HANDLE 10FR (MISCELLANEOUS) ×2
SUCTION TUBE FRAZIER 10FR DISP (MISCELLANEOUS) ×1 IMPLANT
SUT ETHILON 2 0 FS 18 (SUTURE) ×3 IMPLANT
SUT SILK 2 0 (SUTURE)
SUT SILK 2-0 18XBRD TIE 12 (SUTURE) IMPLANT
SUT VIC AB 3-0 PS2 18 (SUTURE) ×3 IMPLANT
TUBE TRACH SHILEY  6 DIST  CUF (TUBING) IMPLANT
TUBE TRACH SHILEY 8 DIST CUF (TUBING) IMPLANT

## 2019-12-30 NOTE — Progress Notes (Signed)
CRITICAL CARE NOTE  CC  follow up respiratory failure  SUBJECTIVE critically ill Prognosis is guarded On biPAP End stage COPD     BP (!) 168/78   Pulse (!) 114   Temp 98.6 F (37 C) (Axillary)   Resp (!) 21   Ht 5' (1.524 m)   Wt 114.5 kg   SpO2 96%   BMI 49.30 kg/m    I/O last 3 completed shifts: In: 576.2 [I.V.:576.2] Out: 4330 [Urine:4330] No intake/output data recorded.  SpO2: 96 % O2 Flow Rate (L/min): 10 L/min FiO2 (%): 50 %  Estimated body mass index is 49.3 kg/m as calculated from the following:   Height as of this encounter: 5' (1.524 m).   Weight as of this encounter: 114.5 kg.  SIGNIFICANT EVENTS   REVIEW OF SYSTEMS  PATIENT IS UNABLE TO PROVIDE COMPLETE REVIEW OF SYSTEMS DUE TO SEVERE CRITICAL ILLNESS        PHYSICAL EXAMINATION:  GENERAL:critically ill appearing, +resp distress HEAD: Normocephalic, atraumatic.  EYES: Pupils equal, round, reactive to light.  No scleral icterus.  MOUTH: Moist mucosal membrane. NECK: Supple.  PULMONARY: +rhonchi, +wheezing CARDIOVASCULAR: S1 and S2. Regular rate and rhythm. No murmurs, rubs, or gallops.  GASTROINTESTINAL: Soft, nontender, -distended.  Positive bowel sounds.   MUSCULOSKELETAL: No swelling, clubbing, or edema.  NEUROLOGIC: obtunded SKIN:intact,warm,dry  MEDICATIONS: I have reviewed all medications and confirmed regimen as documented   CULTURE RESULTS   Recent Results (from the past 240 hour(s))  MRSA PCR Screening     Status: None   Collection Time: 12/20/19  8:08 PM   Specimen: Nasal Mucosa; Nasopharyngeal  Result Value Ref Range Status   MRSA by PCR NEGATIVE NEGATIVE Final    Comment:        The GeneXpert MRSA Assay (FDA approved for NASAL specimens only), is one component of a comprehensive MRSA colonization surveillance program. It is not intended to diagnose MRSA infection nor to guide or monitor treatment for MRSA infections. Performed at [facility], [address] [place], [place] [address]   SARS Coronavirus 2 by RT PCR (hospital order, performed in [facility] hospital lab) Nasopharyngeal Nasopharyngeal Swab     Status: None   Collection Time: 12/21/19  4:12 AM   Specimen: Nasopharyngeal Swab  Result Value Ref Range Status   SARS Coronavirus 2 NEGATIVE NEGATIVE Final    Comment: (NOTE) SARS-CoV-2 target nucleic acids are NOT DETECTED.  The SARS-CoV-2 RNA is generally detectable in upper and lower respiratory specimens during the acute phase of infection. The lowest concentration of SARS-CoV-2 viral copies this assay can detect is 250 copies / mL. A negative result does not preclude SARS-CoV-2 infection and should not be used as the sole basis for treatment or other patient management decisions.  A negative result may occur with improper specimen collection / handling, submission of specimen other than nasopharyngeal swab, presence of viral mutation(s) within the areas targeted by this assay, and inadequate number of viral copies (<250 copies / mL). A negative result must be combined with clinical observations, patient history, and epidemiological information.  Fact Sheet for Patients:   BoilerBrush.com.cy  Fact Sheet for Healthcare Providers: https://pope.com/  This test is not yet approved or  cleared by the [place] FDA and has been authorized for detection and/or diagnosis of SARS-CoV-2 by FDA under an Emergency Use Authorization (EUA).  This EUA will remain in effect (meaning this test can be used) for the duration of the COVID-19 declaration under Section  564(b)(1) of the Act, 21 U.S.C. section 360bbb-3(b)(1), unless the authorization is terminated or revoked sooner.  Performed at [facility], [address] [place], [place] [address]   Culture, blood (Routine X 2) w Reflex to ID Panel     Status: None   Collection Time: 12/21/19 10:33 AM   Specimen:  BLOOD  Result Value Ref Range Status   Specimen Description BLOOD [facility]  Final   Special Requests   Final    BOTTLES DRAWN AEROBIC AND ANAEROBIC Blood Culture adequate volume   Culture   Final    NO GROWTH 5 DAYS Performed at [facility], [address] [place], [place] [address]    Report Status 12/26/2019 FINAL  Final  Culture, blood (Routine X 2) w Reflex to ID Panel     Status: None   Collection Time: 12/21/19 10:45 AM   Specimen: BLOOD  Result Value Ref Range Status   Specimen Description BLOOD [facility]  Final   Special Requests   Final    BOTTLES DRAWN AEROBIC ONLY Blood Culture results may not be optimal due to an inadequate volume of blood received in culture bottles   Culture   Final    NO GROWTH 5 DAYS Performed at [facility], [address] [place], [place] [address]    Report Status 12/26/2019 FINAL  Final  SARS Coronavirus 2 by RT PCR (hospital order, performed in [facility] hospital lab) Nasopharyngeal Nasopharyngeal Swab     Status: None   Collection Time: 12/22/19 11:28 AM   Specimen: Nasopharyngeal Swab  Result Value Ref Range Status   SARS Coronavirus 2 NEGATIVE NEGATIVE Final    Comment: (NOTE) SARS-CoV-2 target nucleic acids are NOT DETECTED.  The SARS-CoV-2 RNA is generally detectable in upper and lower respiratory specimens during the acute phase of infection. The lowest concentration of SARS-CoV-2 viral copies this assay can detect is 250 copies / mL. A negative result does not preclude SARS-CoV-2 infection and should not be used as the sole basis for treatment or other patient management decisions.  A negative result may occur with improper specimen collection / handling, submission of specimen other than nasopharyngeal swab, presence of viral mutation(s) within the areas targeted by this assay, and inadequate number of viral copies (<250 copies / mL). A negative result must be combined with clinical observations, patient history, and  epidemiological information.  Fact Sheet for Patients:   BoilerBrush.com.cy  Fact Sheet for Healthcare Providers: https://pope.com/  This test is not yet approved or  cleared by the [place] FDA and has been authorized for detection and/or diagnosis of SARS-CoV-2 by FDA under an Emergency Use Authorization (EUA).  This EUA will remain in effect (meaning this test can be used) for the duration of the COVID-19 declaration under Section 564(b)(1) of the Act, 21 U.S.C. section 360bbb-3(b)(1), unless the authorization is terminated or revoked sooner.  Performed at [facility], [address] [place], [place] [address]             ASSESSMENT AND PLAN SYNOPSIS   Severe ACUTE Hypoxic and Hypercapnic Respiratory Failure due to COPD exacerbation She is DNR/DNI On BIPAP Patient is suffering on biPAP Recommend COMFORT CARE MEASURES  Morbid obesity, possible OSA.   Will certainly impact respiratory mechanics    NEUROLOGY confused   CARDIAC ICU monitoring  ID -continue IV abx as prescibed -follow up cultures  GI GI PROPHYLAXIS as indicated  NUTRITIONAL STATUS Nutrition Status: Nutrition Problem: Inadequate oral intake  Etiology: inability to eat (pt sedated and ventilated) Signs/Symptoms: NPO status     DIET-->NPO Constipation protocol as indicated  ENDO - will use ICU hypoglycemic\Hyperglycemia protocol if indicated     ELECTROLYTES -follow labs as needed -replace as needed -pharmacy consultation and following   DVT/GI PRX ordered and assessed TRANSFUSIONS AS NEEDED MONITOR FSBS I Assessed the need for Labs I Assessed the need for Foley I Assessed the need for Central Venous Line Family Discussion when available I Assessed the need for Mobilization I made an Assessment of medications to be adjusted accordingly Safety Risk assessment completed   CASE DISCUSSED IN MULTIDISCIPLINARY  ROUNDS WITH ICU TEAM  Critical Care Time devoted to patient care services described in this note is 34 minutes.   Overall, patient is critically ill, prognosis is guarded.  Patient with Multiorgan failure and at high risk for cardiac arrest and death.  Recommend comfort care measures  Lucie Leather, M.D.  [facility]  Medical Director [facility] Medical Director [facility] Cardio-Pulmonary Department

## 2019-12-30 NOTE — Progress Notes (Signed)
Report was given, daughter updated. Pt is stable, although confused, hallucinating. Does not seems in distress. Will transfer shortly

## 2019-12-30 NOTE — Progress Notes (Signed)
Transfer pt rec'd from ICU.  A&Ox4 with NAD noted.  O2 upon arrival on room air 76% O2  via [place] stated at 4L and o2 recovered to 90% all other VSS.  Pt resting soundly in bed oriented to room and call bell system.  Will monitor.

## 2019-12-30 NOTE — Consult Note (Signed)
PHARMACY CONSULT NOTE   Pharmacy Consult for Electrolyte Monitoring and Replacement   Recent Labs: Potassium (mmol/L)  Date Value  12/30/2019 4.4  03/26/2014 3.9   Magnesium (mg/dL)  Date Value  11/12/1599 1.9  03/20/2012 1.5 (L)   Calcium (mg/dL)  Date Value  09/32/3557 10.1   Calcium, Total (mg/dL)  Date Value  32/20/2542 8.5   Albumin (g/dL)  Date Value  70/62/3762 3.9  03/26/2014 3.2 (L)   Phosphorus (mg/dL)  Date Value  83/15/1761 4.2   Sodium (mmol/L)  Date Value  12/30/2019 138  03/26/2014 142   Corrected Ca: 8.6 mg/dL  Assessment: [name] Is 54 y.o female with PMH significant for has a past medical history of Anxiety, COPD, HTN, morbid obesity, and tobacco abuse presented with progressive shortness of breath and fatigue x3 days, found to be in acute hypercarbic and hypoxic respiratory failure; failed BiPAP and had to be emergently intubated; failed SBT 8/11. Patient with seizure like activity, now on Keppra with Neurology following.   Goal of Therapy:  Potassium 4.0 - 5.1 mmol/L Magnesium 2.0 - 2.4 mg/dL All Other Electrolytes WNL  Plan:  Potassium elevated this morning. Treated with repeat back to normal. Otherwise, electrolytes stable. Continue to follow along.  Pricilla Riffle, PharmD 12/30/2019 12:18 PM

## 2019-12-30 NOTE — Progress Notes (Signed)
Tried to call and update daughters, son. No one answers at this time

## 2019-12-30 NOTE — Progress Notes (Signed)
The Clinical status was relayed to family in detail. Daughter at bedside, patient is suffering and want to remove mask, the daughter h as expressed along with patient to be made comfortable.    Updated and notified of patients medical condition. Patient with increased WOB and using accessory muscles to breathe Explained to family course of therapy and the modalities     Patient with Progressive resp failure from COPD with very low chance of meaningful recovery despite all aggressive and optimal medical therapy.   Process associated with suffering.  Family understands the situation.  They have consented and agreed to DNR/DNI and would like to proceed with Comfort care measures.  Family are satisfied with Plan of action and management. All questions answered     Lucie Leather, M.D.  [facility]  Medical Director [facility] Medical Director [facility] Cardio-Pulmonary Department

## 2019-12-31 ENCOUNTER — Encounter: Payer: Self-pay | Admitting: Pulmonary Disease

## 2019-12-31 MED ORDER — MELATONIN 5 MG PO TABS
2.5000 mg | ORAL_TABLET | Freq: Once | ORAL | Status: AC
  Administered 2019-12-31: 2.5 mg via ORAL
  Filled 2019-12-31: qty 1

## 2019-12-31 MED ORDER — IPRATROPIUM-ALBUTEROL 0.5-2.5 (3) MG/3ML IN SOLN
3.0000 mL | Freq: Three times a day (TID) | RESPIRATORY_TRACT | Status: DC
  Administered 2020-01-01: 3 mL via RESPIRATORY_TRACT
  Filled 2019-12-31: qty 3

## 2019-12-31 MED ORDER — ENOXAPARIN SODIUM 40 MG/0.4ML ~~LOC~~ SOLN
40.0000 mg | Freq: Two times a day (BID) | SUBCUTANEOUS | Status: DC
  Administered 2019-12-31 – 2020-01-01 (×3): 40 mg via SUBCUTANEOUS
  Filled 2019-12-31 (×3): qty 0.4

## 2019-12-31 NOTE — Progress Notes (Signed)
Patient's daughter called and shared concern that mother has not slept in 24 hours and would like for her to get some rest. Patient has had some hallucinations stating that she has seen people standing in the corners of the room while she was in the ICU and has not been able to sleep since then.

## 2019-12-31 NOTE — Progress Notes (Signed)
Nutrition Brief Follow-Up Note  Chart reviewed. Patient has transitioned to comfort care.   No further nutrition interventions warranted at this time. Please re-consult RD as needed.   Kelechi Orgeron King, MS, RD, LDN Pager number available on Amion  

## 2019-12-31 NOTE — Progress Notes (Addendum)
Progress Note    [name]  HAL:[id number] DOB: 11-04-1965  DOA: 12/20/2019 PCP: Cathlean Marseilles, MD      Brief Narrative:    Medical records reviewed and are as summarized below:  [name] is a 54 y.o. female       Assessment/Plan:   Principal Problem:   Acute on chronic respiratory failure with hypoxia and hypercapnia (HCC) Active Problems:   COPD exacerbation (HCC)   Nutrition Problem: Inadequate oral intake Etiology: inability to eat (pt sedated and ventilated)  Signs/Symptoms: NPO status   Body mass index is 48.12 kg/m.  (Morbid obesity)   PLAN  Acute on chronic hypoxemic and hypercapnic respiratory failure Obesity hypoventilation syndrome Severe COPD Tobacco use disorder Morbid obesity   PLAN  Continue oxygen via nasal cannula.  She uses 4 L/min oxygen at baseline Continue bronchodilators Initially, patient and her daughter had opted for comfort measures per note from intensivist, Dr. Belia Heman, on 12/30/2019.  However, earlier this morning on rounds, patient said she had decided against comfort measures and requested full scope of care.  However, she wanted to remain DNR.  This was confirmed by her fianc at the bedside.  Later in the evening, I was called by her fianc who said that patient had had a change of mind after they spoke to her daughter.  The patient said she wanted what her mom had at home, whick was comfort measures at home.  It seems she was under the impression that she will be going to hospice house.  She then said she was not sure about her position. It appears patient is not clear about what she really wants.  I recommended hospice consult for further discussion about comfort measures/hospice.  In the meantime, comfort measures have been canceled (per patient's request) pending further discussion with hospice.   Total time spent on encounter was 38 minutes    Diet Order            Diet clear liquid Room service  appropriate? Yes; Fluid consistency: Thin  Diet effective now                     Medications:   . enoxaparin (LOVENOX) injection  40 mg Subcutaneous Q12H  . ipratropium-albuterol  3 mL Nebulization Q6H  . sodium chloride flush  10-40 mL Intracatheter Q12H   Continuous Infusions: . dextrose 20 mL/hr at 12/30/19 1437     Anti-infectives (From admission, onward)   Start     Dose/Rate Route Frequency Ordered Stop   12/22/19 0600  azithromycin (ZITHROMAX) 500 mg in sodium chloride 0.9 % 250 mL IVPB  Status:  Discontinued        500 mg 250 mL/hr over 60 Minutes Intravenous Every 24 hours 12/21/19 0658 12/25/19 1241   12/22/19 0400  cefTRIAXone (ROCEPHIN) 1 g in sodium chloride 0.9 % 100 mL IVPB        1 g 200 mL/hr over 30 Minutes Intravenous Every 24 hours 12/21/19 0658 12/27/19 0402   12/21/19 0215  cefTRIAXone (ROCEPHIN) 2 g in sodium chloride 0.9 % 100 mL IVPB        2 g 200 mL/hr over 30 Minutes Intravenous  Once 12/21/19 0210 12/21/19 0439   12/21/19 0215  azithromycin (ZITHROMAX) 500 mg in sodium chloride 0.9 % 250 mL IVPB        500 mg 250 mL/hr over 60 Minutes Intravenous  Once 12/21/19 0210 12/21/19 7353  Family Communication/Anticipated D/C date and plan/Code Status   DVT prophylaxis: enoxaparin (LOVENOX) injection 40 mg Start: 12/31/19 2200     Code Status: DNR  Family Communication: Plan discussed with her fiance at the bedside Disposition Plan:    Status is: Inpatient  Remains inpatient appropriate because:Inpatient level of care appropriate due to severity of illness   Dispo: The patient is from: Home              Anticipated d/c is to: Home              Anticipated d/c date is: 1 day              Patient currently is not medically stable to d/c.           Subjective:   C/o diarrhea and abdominal pain.  No shortness of breath or chest pain.  Objective:    Vitals:   12/31/19 1424 12/31/19 1810 12/31/19 2042  12/31/19 2109  BP:  98/62    Pulse:  88    Resp:  18    Temp:  98.4 F (36.9 C)    TempSrc:  Oral    SpO2: 91% 100% 94%   Weight:    111.8 kg  Height:       No data found.   Intake/Output Summary (Last 24 hours) at 12/31/2019 2133 Last data filed at 12/31/2019 1741 Gross per 24 hour  Intake 240 ml  Output 1225 ml  Net -985 ml   Filed Weights   12/29/19 0449 12/30/19 0500 12/31/19 2109  Weight: 117.8 kg 114.5 kg 111.8 kg    Exam:  GEN: NAD SKIN: Warm and dry EYES: EOMI ENT: MMM CV: RRR PULM: CTA B ABD: soft, obese, NT, +BS CNS: AAO x 3, non focal EXT: No edema or tenderness   Data Reviewed:   I have personally reviewed following labs and imaging studies:  Labs: Labs show the following:   Basic Metabolic Panel: Recent Labs  Lab 12/26/19 0513 12/26/19 0513 12/27/19 0526 12/27/19 0526 12/28/19 0426 12/28/19 0426 12/29/19 0447 12/29/19 0447 12/30/19 0412 12/30/19 1046  NA 139  --  135  --  139  --  135  --  138  --   K 3.6   < > 4.1   < > 3.8   < > 4.5   < > 5.3* 4.4  CL 99  --  96*  --  97*  --  95*  --  93*  --   CO2 28  --  32  --  32  --  31  --  30  --   GLUCOSE 163*  --  135*  --  119*  --  164*  --  173*  --   BUN 29*  --  34*  --  33*  --  29*  --  25*  --   CREATININE 0.54  --  0.65  --  0.44  --  0.55  --  0.43*  --   CALCIUM 7.9*  --  8.5*  --  8.4*  --  8.9  --  10.1  --   MG 1.7  --  2.4  --  2.0  --  2.1  --  1.9  --   PHOS 3.0  --  2.6  --  3.2  --  3.4  --  4.2  --    < > = values in this interval not displayed.   GFR Estimated  Creatinine Clearance: 89.3 mL/min (A) (by C-G formula based on SCr of 0.43 mg/dL (L)). Liver Function Tests: Recent Labs  Lab 12/28/19 0426 12/29/19 0447 12/30/19 0412  AST 256* 33 30  ALT 203* 153* 131*  ALKPHOS 69 73 82  BILITOT 2.1* 2.0* 2.3*  PROT 5.8* 6.4* 7.7  ALBUMIN 2.9* 3.4* 3.9   Recent Labs  Lab 12/28/19 0426  LIPASE 27   Recent Labs  Lab 12/29/19 0447  AMMONIA 15   Coagulation  profile No results for input(s): INR, PROTIME in the last 168 hours.  CBC: Recent Labs  Lab 12/26/19 0513 12/27/19 0526 12/28/19 0426 12/29/19 0447 12/30/19 0412  WBC 7.2 8.8 7.3 7.4 9.4  NEUTROABS 6.2 7.3 5.0 6.7 8.5*  HGB 17.5* 15.4* 15.1* 15.4* 16.4*  HCT 57.8* 52.9* 51.0* 51.7* 57.6*  MCV 79.8* 82.5 81.1 81.4 84.6  PLT 165 193 228 233 315   Cardiac Enzymes: No results for input(s): CKTOTAL, CKMB, CKMBINDEX, TROPONINI in the last 168 hours. BNP (last 3 results) No results for input(s): PROBNP in the last 8760 hours. CBG: Recent Labs  Lab 12/29/19 2035 12/30/19 0019 12/30/19 0443 12/30/19 0728 12/30/19 1200  GLUCAP 110* 129* 191* 197* 150*   D-Dimer: No results for input(s): DDIMER in the last 72 hours. Hgb A1c: No results for input(s): HGBA1C in the last 72 hours. Lipid Profile: No results for input(s): CHOL, HDL, LDLCALC, TRIG, CHOLHDL, LDLDIRECT in the last 72 hours. Thyroid function studies: No results for input(s): TSH, T4TOTAL, T3FREE, THYROIDAB in the last 72 hours.  Invalid input(s): FREET3 Anemia work up: No results for input(s): VITAMINB12, FOLATE, FERRITIN, TIBC, IRON, RETICCTPCT in the last 72 hours. Sepsis Labs: Recent Labs  Lab 12/27/19 0526 12/28/19 0426 12/29/19 0447 12/30/19 0412  WBC 8.8 7.3 7.4 9.4    Microbiology Recent Results (from the past 240 hour(s))  SARS Coronavirus 2 by RT PCR (hospital order, performed in [facility] hospital lab) Nasopharyngeal Nasopharyngeal Swab     Status: None   Collection Time: 12/22/19 11:28 AM   Specimen: Nasopharyngeal Swab  Result Value Ref Range Status   SARS Coronavirus 2 NEGATIVE NEGATIVE Final    Comment: (NOTE) SARS-CoV-2 target nucleic acids are NOT DETECTED.  The SARS-CoV-2 RNA is generally detectable in upper and lower respiratory specimens during the acute phase of infection. The lowest concentration of SARS-CoV-2 viral copies this assay can detect is 250 copies / mL. A negative  result does not preclude SARS-CoV-2 infection and should not be used as the sole basis for treatment or other patient management decisions.  A negative result may occur with improper specimen collection / handling, submission of specimen other than nasopharyngeal swab, presence of viral mutation(s) within the areas targeted by this assay, and inadequate number of viral copies (<250 copies / mL). A negative result must be combined with clinical observations, patient history, and epidemiological information.  Fact Sheet for Patients:   BoilerBrush.com.cy  Fact Sheet for Healthcare Providers: https://pope.com/  This test is not yet approved or  cleared by the [place] FDA and has been authorized for detection and/or diagnosis of SARS-CoV-2 by FDA under an Emergency Use Authorization (EUA).  This EUA will remain in effect (meaning this test can be used) for the duration of the COVID-19 declaration under Section 564(b)(1) of the Act, 21 U.S.C. section 360bbb-3(b)(1), unless the authorization is terminated or revoked sooner.  Performed at [facility], [address] [place], [place] [address]     Procedures and diagnostic  studies:  No results found.             LOS: 10 days   Ruven Corradi  [facility]   Pager on [email or web address]. If 7PM-7AM, please contact night-coverage at [email or web address]     12/31/2019, 9:33 PM

## 2019-12-31 NOTE — Progress Notes (Signed)
eLink Physician-Brief Progress Note Patient Name: [name] DOB: 1965-12-26 MRN: [id number]   Date of Service  12/31/2019  HPI/Events of Note  Patient requests sleep aid. Patient requests Ambien. It is now 4:10 AM which is too late for Ambien tonight.   eICU Interventions  Plan: 1. Melatonin 3 mg PO X 1 now.  2. Primary service can address Ambien request on rounds tomorrow.      Intervention Category Major Interventions: Other:  Lenell Antu 12/31/2019, 4:10 AM

## 2019-12-31 NOTE — Progress Notes (Signed)
   12/31/19 1300  Clinical Encounter Type  Visited With Patient and family together  Visit Type Follow-up  Referral From Chaplain  Consult/Referral To Chaplain  Chaplain stopped in to check on patient, but she is sleeping. Chaplain briefly spoke to her fiance, he said she just went to sleep. Chaplain wished them well and said she will follow up later.

## 2019-12-31 NOTE — Progress Notes (Signed)
PHARMACIST - PHYSICIAN COMMUNICATION  CONCERNING:  Enoxaparin (Lovenox) for DVT Prophylaxis    RECOMMENDATION: Patient was prescribed enoxaprin 40mg  q24 hours for VTE prophylaxis.   Filed Weights   12/28/19 0435 12/29/19 0449 12/30/19 0500  Weight: 119.4 kg (263 lb 3.7 oz) 117.8 kg (259 lb 11.2 oz) 114.5 kg (252 lb 6.8 oz)    Body mass index is 49.3 kg/m.  Estimated Creatinine Clearance: 90.6 mL/min (A) (by C-G formula based on SCr of 0.43 mg/dL (L)).   Based on [facility] policy patient is candidate for enoxaparin 40mg  every 12 hour dosing due to BMI being >40.   DESCRIPTION: Pharmacy has adjusted enoxaparin dose per [facility] policy.  Patient is now receiving enoxaparin 40mg  every 12 hours.    [facility], PharmD Clinical Pharmacist  12/31/2019 6:29 PM

## 2020-01-01 DIAGNOSIS — J9622 Acute and chronic respiratory failure with hypercapnia: Secondary | ICD-10-CM

## 2020-01-01 DIAGNOSIS — J9621 Acute and chronic respiratory failure with hypoxia: Secondary | ICD-10-CM

## 2020-01-01 LAB — GLUCOSE, CAPILLARY: Glucose-Capillary: 119 mg/dL — ABNORMAL HIGH (ref 70–99)

## 2020-01-01 MED ORDER — IPRATROPIUM-ALBUTEROL 0.5-2.5 (3) MG/3ML IN SOLN: 3.0000 mL | RESPIRATORY_TRACT | Status: DC | PRN

## 2020-01-01 NOTE — Plan of Care (Signed)
  Problem: Education: Goal: Knowledge of General Education information will improve Description: Including pain rating scale, medication(s)/side effects and non-pharmacologic comfort measures Outcome: Progressing   Problem: Health Behavior/Discharge Planning: Goal: Ability to manage health-related needs will improve Outcome: Progressing   Problem: Clinical Measurements: Goal: Ability to maintain clinical measurements within normal limits will improve Outcome: Progressing Goal: Will remain free from infection Outcome: Progressing Goal: Diagnostic test results will improve Outcome: Progressing Goal: Respiratory complications will improve Outcome: Progressing Goal: Cardiovascular complication will be avoided Outcome: Progressing   Problem: Activity: Goal: Risk for activity intolerance will decrease Outcome: Progressing   Problem: Nutrition: Goal: Adequate nutrition will be maintained Outcome: Progressing   Problem: Coping: Goal: Level of anxiety will decrease Outcome: Progressing   Problem: Elimination: Goal: Will not experience complications related to bowel motility Outcome: Progressing Goal: Will not experience complications related to urinary retention Outcome: Progressing   Problem: Elimination: Goal: Will not experience complications related to urinary retention Outcome: Progressing   Problem: Pain Managment: Goal: General experience of comfort will improve Outcome: Progressing   Problem: Safety: Goal: Ability to remain free from injury will improve Outcome: Progressing   Problem: Skin Integrity: Goal: Risk for impaired skin integrity will decrease Outcome: Progressing  Pt is unclear on how she wants to proceed - she does not seem clear on hospice - end of life and home health - more aggressive treatment - spoke with MD re this and also pt thinks she cannot rescind DNR - MD is aware .

## 2020-01-01 NOTE — Progress Notes (Signed)
PT Cancellation Note  Patient Details Name: [name] MRN: [id number] DOB: 01-Apr-1966   Cancelled Treatment:    Reason Eval/Treat Not Completed: Other (comment). Order received and chart reviewed. Pt noted to currently have purple MEWS and on comfort measures. Per MD note, plan is for a hospice consult. Will hold PT evaluation until hospice consult complete and GOC established. Thank you   Ezekiel Ina, PT DPT 01/01/2020, 12:00 PM

## 2020-01-01 NOTE — Progress Notes (Signed)
OT Cancellation Note  Patient Details Name: [name] MRN: [id number] DOB: Jan 07, 1966   Cancelled Treatment:    Reason Eval/Treat Not Completed: Other (comment). Order received and chart reviewed. Pt noted to currently have purple MEWS and on comfort measures, however, per MD note on 12/31/19 pt/family appear to have had some indecision regarding goals of care. Per MD note, plan is for a hospice consult. Will hold OT evaluation until hospice consult complete and GOC established. Thank you.   Rockney Ghee, M.S., OTR/L Ascom: [phone] 01/01/20, 10:54 AM

## 2020-01-01 NOTE — Progress Notes (Signed)
Progress Note    [name]  [id number]:[id number] DOB: 1965-08-08  DOA: 12/20/2019 PCP: Cathlean Marseilles, MD      Brief Narrative:    Medical records reviewed and are as summarized below:  [name] is a 54 y.o. female       Assessment/Plan:   Principal Problem:   Acute on chronic respiratory failure with hypoxia and hypercapnia (HCC) Active Problems:   COPD exacerbation (HCC)   Nutrition Problem: Inadequate oral intake Etiology: inability to eat (pt sedated and ventilated)  Signs/Symptoms: NPO status   Body mass index is 48.12 kg/m.  (Morbid obesity)   PLAN  Acute on chronic hypoxemic and hypercapnic respiratory failure Chronic diastolic CHF Probable obesity hypoventilation syndrome Severe COPD Tobacco use disorder Morbid obesity   PLAN  Continue oxygen via nasal cannula.  She uses 4 L/min oxygen at baseline Continue bronchodilators Today, patient said she wanted to go home with hospice. Plan of care was discussed with her daughter, [name].  She also reiterated the fact that patient be discharged home with hospice.  She requested a hospital bed and "chair" to help take care of the patient at home. Official hospice consult is still pending Case manager has been notified for discharge planning. Plan to discharge home with hospice tomorrow. Remove Foley catheter per patient and daughter's request    Diet Order            Diet Heart Room service appropriate? Yes; Fluid consistency: Thin  Diet effective now                     Medications:   . enoxaparin (LOVENOX) injection  40 mg Subcutaneous Q12H  . sodium chloride flush  10-40 mL Intracatheter Q12H   Continuous Infusions: . dextrose 20 mL/hr at 12/30/19 1437     Anti-infectives (From admission, onward)   Start     Dose/Rate Route Frequency Ordered Stop   12/22/19 0600  azithromycin (ZITHROMAX) 500 mg in sodium chloride 0.9 % 250 mL IVPB  Status:  Discontinued        500  mg 250 mL/hr over 60 Minutes Intravenous Every 24 hours 12/21/19 0658 12/25/19 1241   12/22/19 0400  cefTRIAXone (ROCEPHIN) 1 g in sodium chloride 0.9 % 100 mL IVPB        1 g 200 mL/hr over 30 Minutes Intravenous Every 24 hours 12/21/19 0658 12/27/19 0402   12/21/19 0215  cefTRIAXone (ROCEPHIN) 2 g in sodium chloride 0.9 % 100 mL IVPB        2 g 200 mL/hr over 30 Minutes Intravenous  Once 12/21/19 0210 12/21/19 0439   12/21/19 0215  azithromycin (ZITHROMAX) 500 mg in sodium chloride 0.9 % 250 mL IVPB        500 mg 250 mL/hr over 60 Minutes Intravenous  Once 12/21/19 0210 12/21/19 0644             Family Communication/Anticipated D/C date and plan/Code Status   DVT prophylaxis: enoxaparin (LOVENOX) injection 40 mg Start: 12/31/19 2200     Code Status: DNR  Family Communication: Plan discussed with her daughter, [name]. Disposition Plan:    Status is: Inpatient  Remains inpatient appropriate because:Inpatient level of care appropriate due to severity of illness   Dispo: The patient is from: Home              Anticipated d/c is to: Home  Anticipated d/c date is: 1 day              Patient currently is not medically stable to d/c.           Subjective:   No shortness of breath or chest pain.  She complained of discomfort from Foley catheter.  Objective:    Vitals:   12/31/19 2109 12/31/19 2322 01/01/20 0745 01/01/20 0839  BP:  107/66 (!) 120/59   Pulse:  90 80 100  Resp:  16 17 16   Temp:  98.7 F (37.1 C) 98.3 F (36.8 C)   TempSrc:  Oral Oral   SpO2:  92% 94% 91%  Weight: 111.8 kg     Height:       No data found.   Intake/Output Summary (Last 24 hours) at 01/01/2020 1524 Last data filed at 01/01/2020 0647 Gross per 24 hour  Intake --  Output 625 ml  Net -625 ml   Filed Weights   12/29/19 0449 12/30/19 0500 12/31/19 2109  Weight: 117.8 kg 114.5 kg 111.8 kg    Exam:  GEN: No acute distress SKIN: Warm and dry EYES: No  pallor or icterus ENT: MMM CV: RRR PULM: No wheezing or rales heard ABD: soft, obese, NT, +BS CNS: AAO x 3, non focal EXT: No edema or tenderness   Data Reviewed:   I have personally reviewed following labs and imaging studies:  Labs: Labs show the following:   Basic Metabolic Panel: Recent Labs  Lab 12/26/19 0513 12/26/19 0513 12/27/19 0526 12/27/19 0526 12/28/19 0426 12/28/19 0426 12/29/19 0447 12/29/19 0447 12/30/19 0412 12/30/19 1046  NA 139  --  135  --  139  --  135  --  138  --   K 3.6   < > 4.1   < > 3.8   < > 4.5   < > 5.3* 4.4  CL 99  --  96*  --  97*  --  95*  --  93*  --   CO2 28  --  32  --  32  --  31  --  30  --   GLUCOSE 163*  --  135*  --  119*  --  164*  --  173*  --   BUN 29*  --  34*  --  33*  --  29*  --  25*  --   CREATININE 0.54  --  0.65  --  0.44  --  0.55  --  0.43*  --   CALCIUM 7.9*  --  8.5*  --  8.4*  --  8.9  --  10.1  --   MG 1.7  --  2.4  --  2.0  --  2.1  --  1.9  --   PHOS 3.0  --  2.6  --  3.2  --  3.4  --  4.2  --    < > = values in this interval not displayed.   GFR Estimated Creatinine Clearance: 89.3 mL/min (A) (by C-G formula based on SCr of 0.43 mg/dL (L)). Liver Function Tests: Recent Labs  Lab 12/28/19 0426 12/29/19 0447 12/30/19 0412  AST 256* 33 30  ALT 203* 153* 131*  ALKPHOS 69 73 82  BILITOT 2.1* 2.0* 2.3*  PROT 5.8* 6.4* 7.7  ALBUMIN 2.9* 3.4* 3.9   Recent Labs  Lab 12/28/19 0426  LIPASE 27   Recent Labs  Lab 12/29/19 0447  AMMONIA 15   Coagulation profile No  results for input(s): INR, PROTIME in the last 168 hours.  CBC: Recent Labs  Lab 12/26/19 0513 12/27/19 0526 12/28/19 0426 12/29/19 0447 12/30/19 0412  WBC 7.2 8.8 7.3 7.4 9.4  NEUTROABS 6.2 7.3 5.0 6.7 8.5*  HGB 17.5* 15.4* 15.1* 15.4* 16.4*  HCT 57.8* 52.9* 51.0* 51.7* 57.6*  MCV 79.8* 82.5 81.1 81.4 84.6  PLT 165 193 228 233 315   Cardiac Enzymes: No results for input(s): CKTOTAL, CKMB, CKMBINDEX, TROPONINI in the last 168  hours. BNP (last 3 results) No results for input(s): PROBNP in the last 8760 hours. CBG: Recent Labs  Lab 12/29/19 2035 12/30/19 0019 12/30/19 0443 12/30/19 0728 12/30/19 1200  GLUCAP 110* 129* 191* 197* 150*   D-Dimer: No results for input(s): DDIMER in the last 72 hours. Hgb A1c: No results for input(s): HGBA1C in the last 72 hours. Lipid Profile: No results for input(s): CHOL, HDL, LDLCALC, TRIG, CHOLHDL, LDLDIRECT in the last 72 hours. Thyroid function studies: No results for input(s): TSH, T4TOTAL, T3FREE, THYROIDAB in the last 72 hours.  Invalid input(s): FREET3 Anemia work up: No results for input(s): VITAMINB12, FOLATE, FERRITIN, TIBC, IRON, RETICCTPCT in the last 72 hours. Sepsis Labs: Recent Labs  Lab 12/27/19 0526 12/28/19 0426 12/29/19 0447 12/30/19 0412  WBC 8.8 7.3 7.4 9.4    Microbiology No results found for this or any previous visit (from the past 240 hour(s)).  Procedures and diagnostic studies:  No results found.             LOS: 11 days   Karisa Nesser  [facility] [employer] loss adjuster on [email or web address]. If 7PM-7AM, please contact night-coverage at [email or web address]     01/01/2020, 3:24 PM

## 2020-01-02 MED ORDER — IPRATROPIUM-ALBUTEROL 0.5-2.5 (3) MG/3ML IN SOLN: 3.0000 mL | Freq: Four times a day (QID) | RESPIRATORY_TRACT | Status: AC | PRN

## 2020-01-02 NOTE — Discharge Summary (Signed)
Physician Discharge Summary  [name] XIP:[id number] DOB: [birth year] DOA: 12/20/2019  PCP: Cathlean Marseilles, MD  Admit date: 12/20/2019 Discharge date: 01/02/2020  Discharge disposition: Home with hospice   Recommendations for Outpatient Follow-Up:   Outpatient follow-up with hospice   Discharge Diagnosis:   Principal Problem:   Acute on chronic respiratory failure with hypoxia and hypercapnia (HCC) Active Problems:   COPD exacerbation (HCC)    Discharge Condition: Stable.  Diet recommendation:  Diet Order            Diet - low sodium heart healthy           Diet Heart Room service appropriate? Yes; Fluid consistency: Thin  Diet effective now                   Code Status: Full Code     Hospital Course:   [name] is a 54 year old with medical history significant for morbid obesity, chronic diastolic CHF, tobacco use disorder, COPD, chronic hypoxemic and hypercapnic respiratory failure on home O2 at 4 L nasal cannula.  She presented with progressive shortness of breath and fatigue x3 days, found to be in acute hypercarbic and hypoxic respiratory failure; failed BiPAP and had to be emergently intubated.    She was placed on mechanical ventilation for acute hypoxic and hypercapnic respiratory failure and was managed in the ICU by the intensivist.  She was treated with steroids, bronchodilators and empiric IV antibiotics.  Her condition slowly improved and she was successfully extubated.  Patient and her daughter requested comfort measures at home with hospice.  Hospice team was consulted and patient and her daughter were educated about hospice services.  Overall, patient is feeling better and she wants to be discharged home.  Discharge plan was discussed with the patient and her daughter and they verbalized understanding.       Discharge Exam:    Vitals:   01/01/20 1706 01/01/20 2312 01/02/20 0600 01/02/20 0939  BP: (!) 94/52 109/67  99/66    Pulse: 84 82  89  Resp: 18 20  16   Temp: 98.2 F (36.8 C) 98.3 F (36.8 C)  98.8 F (37.1 C)  TempSrc: Oral Oral  Oral  SpO2: 94% 90%  93%  Weight:   109.8 kg   Height:         GEN: NAD SKIN: Warm and dry EYES: No pallor or icterus ENT: MMM CV: RRR PULM: CTA B ABD: soft, obese, NT, +BS CNS: AAO x 3, non focal EXT: No edema or tenderness   The results of significant diagnostics from this hospitalization (including imaging, microbiology, ancillary and laboratory) are listed below for reference.     Procedures and Diagnostic Studies:   DG Abd 1 View  Result Date: 12/21/2019 CLINICAL DATA:  Orogastric tube placement EXAM: ABDOMEN - 1 VIEW COMPARISON:  None. FINDINGS: Tip and side port of the orogastric tube project in the stomach. No dilated bowel visible. IMPRESSION: Orogastric tube tip in the stomach. Electronically Signed   By: 02/20/2020 M.D.   On: 12/21/2019 04:54   02/20/2020 Venous Img Lower Unilateral Left (DVT)  Result Date: 12/21/2019 CLINICAL DATA:  Pain/swelling, evaluate for DVT EXAM: LEFT LOWER EXTREMITY VENOUS DOPPLER ULTRASOUND TECHNIQUE: Gray-scale sonography with compression, as well as color and duplex ultrasound, were performed to evaluate the deep venous system(s) from the level of the common femoral vein through the popliteal and proximal calf veins. COMPARISON:  None. FINDINGS: VENOUS Normal compressibility of  the common femoral, superficial femoral, and popliteal veins, as well as the visualized calf veins. Visualized portions of profunda femoral vein and great saphenous vein unremarkable. No filling defects to suggest DVT on grayscale or color Doppler imaging. Doppler waveforms show normal direction of venous flow, normal respiratory plasticity and response to augmentation. Limited views of the contralateral common femoral vein are unremarkable. OTHER None. Limitations: none IMPRESSION: Negative. Electronically Signed   By: Charline Bills M.D.   On: 12/21/2019  08:50   DG Chest Port 1 View  Result Date: 12/21/2019 CLINICAL DATA:  Endotracheal tube placement EXAM: PORTABLE CHEST 1 VIEW COMPARISON:  12/20/2019 FINDINGS: Endotracheal tube tip is at the level of the clavicular heads. Esophageal 2 side port projects within the stomach. Mild cardiomegaly and pulmonary vascular congestion. IMPRESSION: Endotracheal tube tip at the level of the clavicular heads. Electronically Signed   By: Deatra Robinson M.D.   On: 12/21/2019 04:53   ECHOCARDIOGRAM COMPLETE  Result Date: 12/21/2019    ECHOCARDIOGRAM REPORT   Patient Name:   [name] Date of Exam: 12/21/2019 Medical Rec #:  [id number]      Height:       60.0 in Accession #:    [id number]     Weight:       297.8 lb Date of Birth:  Aug 09, 1965      BSA:          2.211 m Patient Age:    54 years       BP:           133/93 mmHg Patient Gender: F              HR:           94 bpm. Exam Location:  [facility] Procedure: 2D Echo, Cardiac Doppler and Color Doppler Indications:     Acute respiratory insufficiency 518.82  History:         Patient has prior history of Echocardiogram examinations, most                  recent 09/20/2017. COPD; Risk Factors:Hypertension. Tobacco                  abuse.  Sonographer:     Cristela Blue RDCS (AE) Referring Phys:  [id number] Alroy Bailiff TUKOV-YUAL Diagnosing Phys: Chilton Si MD  Sonographer Comments: Echo performed with patient supine and on artificial respirator, Technically difficult study due to poor echo windows and no apical window. IMPRESSIONS  1. Left ventricular ejection fraction, by estimation, is 65 to 70%. The left ventricle has normal function. The left ventricle has no regional wall motion abnormalities. There is mild concentric left ventricular hypertrophy. Left ventricular diastolic function could not be evaluated.  2. Right ventricular systolic function is normal. The right ventricular size is normal. There is normal pulmonary artery systolic pressure.  3. The mitral valve is normal in  structure. No evidence of mitral valve regurgitation. No evidence of mitral stenosis.  4. The aortic valve is tricuspid. Aortic valve regurgitation is not visualized. No aortic stenosis is present.  5. The inferior vena cava is normal in size with greater than 50% respiratory variability, suggesting right atrial pressure of 3 mmHg. FINDINGS  Left Ventricle: Left ventricular ejection fraction, by estimation, is 65 to 70%. The left ventricle has normal function. The left ventricle has no regional wall motion abnormalities. The left ventricular internal cavity size was normal in size. There is  mild concentric left ventricular hypertrophy.  Left ventricular diastolic function could not be evaluated. Right Ventricle: The right ventricular size is normal. No increase in right ventricular wall thickness. Right ventricular systolic function is normal. There is normal pulmonary artery systolic pressure. The tricuspid regurgitant velocity is 1.95 m/s, and  with an assumed right atrial pressure of 3 mmHg, the estimated right ventricular systolic pressure is 18.2 mmHg. Left Atrium: Left atrial size was normal in size. Right Atrium: Right atrial size was normal in size. Pericardium: Trivial pericardial effusion is present. The pericardial effusion is posterior to the left ventricle. Mitral Valve: The mitral valve is normal in structure. Normal mobility of the mitral valve leaflets. No evidence of mitral valve regurgitation. No evidence of mitral valve stenosis. Tricuspid Valve: The tricuspid valve is normal in structure. Tricuspid valve regurgitation is not demonstrated. No evidence of tricuspid stenosis. Aortic Valve: The aortic valve is tricuspid. Aortic valve regurgitation is not visualized. No aortic stenosis is present. Pulmonic Valve: The pulmonic valve was normal in structure. Pulmonic valve regurgitation is not visualized. No evidence of pulmonic stenosis. Aorta: The aortic root is normal in size and structure. Venous:  The inferior vena cava is normal in size with greater than 50% respiratory variability, suggesting right atrial pressure of 3 mmHg. IAS/Shunts: No atrial level shunt detected by color flow Doppler.  LEFT VENTRICLE PLAX 2D LVIDd:         3.95 cm LVIDs:         2.47 cm LV PW:         1.13 cm LV IVS:        1.12 cm LVOT diam:     2.00 cm LVOT Area:     3.14 cm  LEFT ATRIUM         Index LA diam:    2.10 cm 0.95 cm/m                        PULMONIC VALVE AORTA                 PV Vmax:       0.52 m/s Ao Root diam: 2.60 cm PV Peak grad:  1.1 mmHg  TRICUSPID VALVE TR Peak grad:   15.2 mmHg TR Vmax:        195.00 cm/s  SHUNTS Systemic Diam: 2.00 cm Chilton Si MD Electronically signed by Chilton Si MD Signature Date/Time: 12/21/2019/3:10:17 PM    Final      Labs:   Basic Metabolic Panel: Recent Labs  Lab 12/27/19 0526 12/27/19 0526 12/28/19 0426 12/28/19 0426 12/29/19 0447 12/29/19 0447 12/30/19 0412 12/30/19 1046  NA 135  --  139  --  135  --  138  --   K 4.1   < > 3.8   < > 4.5   < > 5.3* 4.4  CL 96*  --  97*  --  95*  --  93*  --   CO2 32  --  32  --  31  --  30  --   GLUCOSE 135*  --  119*  --  164*  --  173*  --   BUN 34*  --  33*  --  29*  --  25*  --   CREATININE 0.65  --  0.44  --  0.55  --  0.43*  --   CALCIUM 8.5*  --  8.4*  --  8.9  --  10.1  --   MG 2.4  --  2.0  --  2.1  --  1.9  --   PHOS 2.6  --  3.2  --  3.4  --  4.2  --    < > = values in this interval not displayed.   GFR Estimated Creatinine Clearance: 88.3 mL/min (A) (by C-G formula based on SCr of 0.43 mg/dL (L)). Liver Function Tests: Recent Labs  Lab 12/28/19 0426 12/29/19 0447 12/30/19 0412  AST 256* 33 30  ALT 203* 153* 131*  ALKPHOS 69 73 82  BILITOT 2.1* 2.0* 2.3*  PROT 5.8* 6.4* 7.7  ALBUMIN 2.9* 3.4* 3.9   Recent Labs  Lab 12/28/19 0426  LIPASE 27   Recent Labs  Lab 12/29/19 0447  AMMONIA 15   Coagulation profile No results for input(s): INR, PROTIME in the last 168  hours.  CBC: Recent Labs  Lab 12/27/19 0526 12/28/19 0426 12/29/19 0447 12/30/19 0412  WBC 8.8 7.3 7.4 9.4  NEUTROABS 7.3 5.0 6.7 8.5*  HGB 15.4* 15.1* 15.4* 16.4*  HCT 52.9* 51.0* 51.7* 57.6*  MCV 82.5 81.1 81.4 84.6  PLT 193 228 233 315   Cardiac Enzymes: No results for input(s): CKTOTAL, CKMB, CKMBINDEX, TROPONINI in the last 168 hours. BNP: Invalid input(s): POCBNP CBG: Recent Labs  Lab 12/30/19 0019 12/30/19 0443 12/30/19 0728 12/30/19 1200 01/01/20 1708  GLUCAP 129* 191* 197* 150* 119*   D-Dimer No results for input(s): DDIMER in the last 72 hours. Hgb A1c No results for input(s): HGBA1C in the last 72 hours. Lipid Profile No results for input(s): CHOL, HDL, LDLCALC, TRIG, CHOLHDL, LDLDIRECT in the last 72 hours. Thyroid function studies No results for input(s): TSH, T4TOTAL, T3FREE, THYROIDAB in the last 72 hours.  Invalid input(s): FREET3 Anemia work up No results for input(s): VITAMINB12, FOLATE, FERRITIN, TIBC, IRON, RETICCTPCT in the last 72 hours. Microbiology No results found for this or any previous visit (from the past 240 hour(s)).   Discharge Instructions:   Discharge Instructions    Diet - low sodium heart healthy   Complete by: As directed    Discharge instructions   Complete by: As directed    Follow-up with hospice team at home within the next 1 to 2 days   Discharge wound care:   Complete by: As directed    Keep groin area clean and dry   Increase activity slowly   Complete by: As directed      Allergies as of 01/02/2020   No Known Allergies     Medication List    STOP taking these medications   cyclobenzaprine 10 MG tablet Commonly known as: FLEXERIL   fluticasone 44 MCG/ACT inhaler Commonly known as: FLOVENT HFA   magic mouthwash w/lidocaine Soln     TAKE these medications   Advair HFA 115-21 MCG/ACT inhaler Generic drug: fluticasone-salmeterol Inhale 2 puffs into the lungs 2 (two) times daily.   albuterol 108  (90 Base) MCG/ACT inhaler Commonly known as: VENTOLIN HFA Inhale 2 puffs into the lungs every 6 (six) hours as needed for wheezing or shortness of breath.   clonazePAM 0.5 MG tablet Commonly known as: KLONOPIN Take 0.5 mg by mouth 3 (three) times daily as needed for anxiety.   furosemide 40 MG tablet Commonly known as: LASIX Take 40 mg by mouth 2 (two) times daily.   ipratropium-albuterol 0.5-2.5 (3) MG/3ML Soln Commonly known as: DUONEB Inhale 3 mLs into the lungs every 6 (six) hours as needed. What changed:   when to take this  Another medication with  the same name was removed. Continue taking this medication, and follow the directions you see here.   omeprazole 20 MG capsule Commonly known as: PRILOSEC Take 20 mg by mouth daily.   Spiriva HandiHaler 18 MCG inhalation capsule Generic drug: tiotropium Place 1 capsule into inhaler and inhale daily.            Discharge Care Instructions  (From admission, onward)         Start     Ordered   01/02/20 0000  Discharge wound care:       Comments: Keep groin area clean and dry   01/02/20 1413            Time coordinating discharge: 34 minutes  Signed:  Shirrell Solinger  [facility] 01/02/2020, 2:13 PM   Pager on [email or web address]. If 7PM-7AM, please contact night-coverage at [email or web address]

## 2020-01-02 NOTE — TOC Initial Note (Signed)
Transition of Care [facility]) - Initial/Assessment Note    Patient Details  Name: [name] MRN: [id number] Date of Birth: [birth year]  [facility] CM/SW Contact:    Shelbie Ammons, RN Phone Number: 01/02/2020, 9:50 AM  Clinical Narrative:   RNCM met with patient at bedside, patient resting quietly in bed without any complaints at this time. Discussed current situation and discharge planning. Patient reports that she is still unsure of how she wants to proceed. She reports that she would like to get more information about Hospice services but at the same time she is unsure if she wants to stay at DNR status. Discussed with patient possibility of getting Hospice nurses with [facility] stop by and talk with she and her daughter and she was agreeable to this plan. Daughter plans to be here at around lunch time today.  RNCM reached out to Santiago Glad with [facility] and she or her co-worker will stop by and speak with patient.                 Expected Discharge Plan: [place] Barriers to Discharge: No Barriers Identified   Patient Goals and CMS Choice        Expected Discharge Plan and Services Expected Discharge Plan: [place]       Living arrangements for the past 2 months: Single Family Home                                      Prior Living Arrangements/Services Living arrangements for the past 2 months: Single Family Home Lives with:: Adult Children Patient language and need for interpreter reviewed:: Yes        Need for Family Participation in Patient Care: Yes (Comment) Care giver support system in place?: Yes (comment)   Criminal Activity/Legal Involvement Pertinent to Current Situation/Hospitalization: No - Comment as needed  Activities of Daily Living Home Assistive Devices/Equipment: None ADL Screening (condition at time of admission) Patient's cognitive ability adequate to safely complete daily activities?:  Yes Is the patient deaf or have difficulty hearing?: No Does the patient have difficulty seeing, even when wearing glasses/contacts?: No Does the patient have difficulty concentrating, remembering, or making decisions?: No Patient able to express need for assistance with ADLs?: Yes Does the patient have difficulty dressing or bathing?: No Independently performs ADLs?: Yes (appropriate for developmental age) Does the patient have difficulty walking or climbing stairs?: No Weakness of Legs: None Weakness of Arms/Hands: None  Permission Sought/Granted                  Emotional Assessment Appearance:: Appears stated age Attitude/Demeanor/Rapport: Engaged Affect (typically observed): Appropriate, Calm Orientation: : Oriented to Self, Oriented to Place, Oriented to  Time, Oriented to Situation Alcohol / Substance Use: Not Applicable Psych Involvement: No (comment)  Admission diagnosis:  Acute respiratory failure (HCC) [J96.00] Swelling [R60.9] COPD exacerbation ([place]) [J44.1] Encounter for imaging study to confirm orogastric (OG) tube placement [Z01.89] Congestive heart failure, unspecified HF chronicity, unspecified heart failure type ([place]) [I50.9] Patient Active Problem List   Diagnosis Date Noted   COPD exacerbation ([place])    Swelling    Chest pain 09/20/2017   Pericardial effusion    Acute on chronic respiratory failure with hypoxia and hypercapnia ([place]) 07/29/2017   PNA (pneumonia) 06/07/2017   PCP:  Gennaro Africa, MD Pharmacy:   [facility] [phone] -  [address] (N), [place] - [place] ([place])  [id number] Phone: [phone] Fax: [phone]     Social Determinants of Health (SDOH) Interventions    Readmission Risk Interventions No flowsheet data found.

## 2020-01-02 NOTE — Progress Notes (Signed)
Patient said she wanted to change her code status from DNR to full code after talking to "a couple of people".  She still wants to go home with hospice.  CODE STATUS will be changed in the computer and comfort care order will be discontinued

## 2020-01-02 NOTE — Progress Notes (Signed)
picc line removed and site cleaned with chlorhexidine - air dried and sterile gauze and tegaderm applied site is without redness drainage or warmth and pt is instructed to leave dressing in place for 24 hours and to keep clean and dry

## 2020-01-02 NOTE — Progress Notes (Signed)
[facility] liaison report:  New referral for [facility] hospice services received from [facility]. Patient information sent to referral. Hospice eligibility confirmed. Writer met with patient and her daughter Victorino Dike to initiate education regarding hospice services, philosophy and team approach to care with understanding voiced. DME needs discussed and ordered to be delivered as soon as possible. Family would like patient to discharge home today.  Hospital care team updated. Thank you for the opportunity to be involved in the care of this patient and her family Flo Shanks BSN, RN, [facility] [phone].

## 2020-02-22 ENCOUNTER — Inpatient Hospital Stay
Admission: EM | Admit: 2020-02-22 | Discharge: 2020-02-24 | DRG: 291 | Disposition: A | Discharge status: Hospice | Payer: Medicaid Other | Attending: Internal Medicine | Admitting: Internal Medicine

## 2020-02-22 ENCOUNTER — Emergency Department: Payer: Medicaid Other

## 2020-02-22 DIAGNOSIS — R000 Tachycardia, unspecified: Secondary | ICD-10-CM | POA: Diagnosis present

## 2020-02-22 DIAGNOSIS — I5033 Acute on chronic diastolic (congestive) heart failure: Secondary | ICD-10-CM | POA: Diagnosis present

## 2020-02-22 DIAGNOSIS — I5031 Acute diastolic (congestive) heart failure: Secondary | ICD-10-CM

## 2020-02-22 DIAGNOSIS — Z23 Encounter for immunization: Secondary | ICD-10-CM

## 2020-02-22 DIAGNOSIS — J9811 Atelectasis: Secondary | ICD-10-CM | POA: Diagnosis present

## 2020-02-22 DIAGNOSIS — Z20822 Contact with and (suspected) exposure to covid-19: Secondary | ICD-10-CM | POA: Diagnosis present

## 2020-02-22 DIAGNOSIS — R071 Chest pain on breathing: Secondary | ICD-10-CM | POA: Diagnosis not present

## 2020-02-22 DIAGNOSIS — F17210 Nicotine dependence, cigarettes, uncomplicated: Secondary | ICD-10-CM | POA: Diagnosis present

## 2020-02-22 DIAGNOSIS — D72829 Elevated white blood cell count, unspecified: Secondary | ICD-10-CM | POA: Diagnosis present

## 2020-02-22 DIAGNOSIS — Z9981 Dependence on supplemental oxygen: Secondary | ICD-10-CM | POA: Diagnosis not present

## 2020-02-22 DIAGNOSIS — J441 Chronic obstructive pulmonary disease with (acute) exacerbation: Secondary | ICD-10-CM | POA: Diagnosis present

## 2020-02-22 DIAGNOSIS — R0602 Shortness of breath: Secondary | ICD-10-CM | POA: Diagnosis present

## 2020-02-22 DIAGNOSIS — E785 Hyperlipidemia, unspecified: Secondary | ICD-10-CM | POA: Diagnosis not present

## 2020-02-22 DIAGNOSIS — R0789 Other chest pain: Secondary | ICD-10-CM | POA: Diagnosis present

## 2020-02-22 DIAGNOSIS — Z6841 Body Mass Index (BMI) 40.0 and over, adult: Secondary | ICD-10-CM

## 2020-02-22 DIAGNOSIS — Z8249 Family history of ischemic heart disease and other diseases of the circulatory system: Secondary | ICD-10-CM | POA: Diagnosis not present

## 2020-02-22 DIAGNOSIS — I110 Hypertensive heart disease with heart failure: Principal | ICD-10-CM | POA: Diagnosis present

## 2020-02-22 DIAGNOSIS — Z79899 Other long term (current) drug therapy: Secondary | ICD-10-CM

## 2020-02-22 DIAGNOSIS — R079 Chest pain, unspecified: Secondary | ICD-10-CM | POA: Diagnosis present

## 2020-02-22 DIAGNOSIS — J810 Acute pulmonary edema: Secondary | ICD-10-CM

## 2020-02-22 DIAGNOSIS — E876 Hypokalemia: Secondary | ICD-10-CM | POA: Diagnosis present

## 2020-02-22 DIAGNOSIS — J9611 Chronic respiratory failure with hypoxia: Secondary | ICD-10-CM | POA: Diagnosis present

## 2020-02-22 DIAGNOSIS — E871 Hypo-osmolality and hyponatremia: Secondary | ICD-10-CM | POA: Diagnosis present

## 2020-02-22 DIAGNOSIS — I509 Heart failure, unspecified: Secondary | ICD-10-CM

## 2020-02-22 HISTORY — DX: Heart failure, unspecified: I50.9

## 2020-02-22 LAB — LACTIC ACID, PLASMA
Lactic Acid, Venous: 1.1 mmol/L (ref 0.5–1.9)
Lactic Acid, Venous: 1.2 mmol/L (ref 0.5–1.9)

## 2020-02-22 LAB — CBC
HCT: 52.4 % — ABNORMAL HIGH (ref 36.0–46.0)
Hemoglobin: 16.5 g/dL — ABNORMAL HIGH (ref 12.0–15.0)
MCH: 26.3 pg (ref 26.0–34.0)
MCHC: 31.5 g/dL (ref 30.0–36.0)
MCV: 83.6 fL (ref 80.0–100.0)
Platelets: 228 10*3/uL (ref 150–400)
RBC: 6.27 MIL/uL — ABNORMAL HIGH (ref 3.87–5.11)
RDW: 18.8 % — ABNORMAL HIGH (ref 11.5–15.5)
WBC: 10.6 10*3/uL — ABNORMAL HIGH (ref 4.0–10.5)
nRBC: 0 % (ref 0.0–0.2)

## 2020-02-22 LAB — TROPONIN I (HIGH SENSITIVITY)
Troponin I (High Sensitivity): 3 ng/L (ref <18)
Troponin I (High Sensitivity): 3 ng/L (ref <18)

## 2020-02-22 LAB — BASIC METABOLIC PANEL
Anion gap: 14 (ref 5–15)
BUN: 10 mg/dL (ref 6–20)
CO2: 27 mmol/L (ref 22–32)
Calcium: 8.9 mg/dL (ref 8.9–10.3)
Chloride: 91 mmol/L — ABNORMAL LOW (ref 98–111)
Creatinine, Ser: 0.79 mg/dL (ref 0.44–1.00)
GFR, Estimated: >60 mL/min (ref >60)
Glucose, Bld: 125 mg/dL — ABNORMAL HIGH (ref 70–99)
Potassium: 3.7 mmol/L (ref 3.5–5.1)
Sodium: 132 mmol/L — ABNORMAL LOW (ref 135–145)

## 2020-02-22 LAB — RESPIRATORY PANEL BY RT PCR (FLU A&B, COVID)
Influenza A by PCR: NEGATIVE
Influenza B by PCR: NEGATIVE
SARS Coronavirus 2 by RT PCR: NEGATIVE

## 2020-02-22 MED ORDER — IPRATROPIUM-ALBUTEROL 0.5-2.5 (3) MG/3ML IN SOLN
3.0000 mL | Freq: Four times a day (QID) | RESPIRATORY_TRACT | Status: DC
  Administered 2020-02-22 – 2020-02-24 (×6): 3 mL via RESPIRATORY_TRACT
  Filled 2020-02-22 (×6): qty 3

## 2020-02-22 MED ORDER — ACETAMINOPHEN 650 MG RE SUPP: 650.0000 mg | Freq: Four times a day (QID) | RECTAL | Status: DC | PRN

## 2020-02-22 MED ORDER — ACETAMINOPHEN 325 MG PO TABS
650.0000 mg | ORAL_TABLET | Freq: Four times a day (QID) | ORAL | Status: DC | PRN
  Administered 2020-02-23: 650 mg via ORAL
  Filled 2020-02-22: qty 2

## 2020-02-22 MED ORDER — FUROSEMIDE 10 MG/ML IJ SOLN: 60.0000 mg | Freq: Every day | INTRAMUSCULAR | Status: DC

## 2020-02-22 MED ORDER — SODIUM CHLORIDE 0.9 % IV SOLN
500.0000 mg | INTRAVENOUS | Status: DC
  Administered 2020-02-23 (×2): 500 mg via INTRAVENOUS
  Filled 2020-02-22 (×3): qty 500

## 2020-02-22 MED ORDER — IOHEXOL 350 MG/ML SOLN
75.0000 mL | Freq: Once | INTRAVENOUS | Status: AC | PRN
  Administered 2020-02-22: 75 mL via INTRAVENOUS

## 2020-02-22 MED ORDER — SODIUM CHLORIDE 0.9 % IV SOLN
1.0000 g | INTRAVENOUS | Status: DC
  Administered 2020-02-22: 1 g via INTRAVENOUS
  Filled 2020-02-22: qty 10

## 2020-02-22 MED ORDER — ENOXAPARIN SODIUM 40 MG/0.4ML ~~LOC~~ SOLN
40.0000 mg | SUBCUTANEOUS | Status: DC
  Administered 2020-02-22: 40 mg via SUBCUTANEOUS
  Filled 2020-02-22: qty 0.4

## 2020-02-22 MED ORDER — FUROSEMIDE 10 MG/ML IJ SOLN
40.0000 mg | Freq: Once | INTRAMUSCULAR | Status: AC
  Administered 2020-02-22: 40 mg via INTRAVENOUS
  Filled 2020-02-22: qty 4

## 2020-02-22 NOTE — ED Triage Notes (Signed)
Pt arrives via pov with c/o chest wall pain that is tender to palpate. Pt reports being admitted in aug for COPD exacerbation. Pt reports CP started this past Wednesday. Denies an increase in baseline SOB. On 4L  chronically.

## 2020-02-22 NOTE — ED Provider Notes (Signed)
[facility] Emergency Department Provider Note   ____________________________________________    I have reviewed the triage vital signs and the nursing notes.   HISTORY  Chief Complaint Chest Pain     HPI [name] is a 54 y.o. female with a history of COPD, on 4 L nasal cannula who presents with complaints of cough, right-sided chest pain especially with deep inspiration which started 4 to 5 days ago.  She has not take anything for this.  She reports it continues today.  She has not had fevers or chills has had a Covid vaccines.  She does not take anything for this.  No sick contacts reported.  Past Medical History:  Diagnosis Date  . Anxiety   . COPD (chronic obstructive pulmonary disease) (HCC)   . Essential hypertension   . History of echocardiogram    a. 08/2013 Echo: EF 55-60%, impaired LV relaxation, mild LVH, nl RV fxn, nl RVSP, mild TR; b. 05/2017 Echo: EF 65-70%, no rwma, nl RV fxn.  . Morbid obesity (HCC)   . Tobacco abuse     Patient Active Problem List   Diagnosis Date Noted  . COPD exacerbation (HCC)   . Swelling   . Chest pain 09/20/2017  . Pericardial effusion   . Acute on chronic respiratory failure with hypoxia and hypercapnia (HCC) 07/29/2017  . PNA (pneumonia) 06/07/2017    Past Surgical History:  Procedure Laterality Date  . ABDOMINAL HYSTERECTOMY    . CHOLECYSTECTOMY    . EXTUBATION (ENDOTRACHEAL) IN OR N/A 08/14/2017   Procedure: EXTUBATION (ENDOTRACHEAL) IN OR;  Surgeon: Geanie Logan, MD;  Location: [facility] ORS;  Service: ENT;  Laterality: N/A;  . HERNIA REPAIR    . TEE WITHOUT CARDIOVERSION N/A 09/20/2017   Procedure: TRANSESOPHAGEAL ECHOCARDIOGRAM (TEE);  Surgeon: Laurier Nancy, MD;  Location: [facility] ORS;  Service: Cardiovascular;  Laterality: N/A;    Prior to Admission medications   Medication Sig Start Date End Date Taking? Authorizing Provider  ADVAIR HFA 115-21 MCG/ACT inhaler Inhale 2 puffs into the lungs 2  (two) times daily. 11/21/19   [provider]  albuterol (PROVENTIL HFA;VENTOLIN HFA) 108 (90 Base) MCG/ACT inhaler Inhale 2 puffs into the lungs every 6 (six) hours as needed for wheezing or shortness of breath. 06/12/17   Gouru, Deanna Artis, MD  clonazePAM (KLONOPIN) 0.5 MG tablet Take 0.5 mg by mouth 3 (three) times daily as needed for anxiety.     [provider]  furosemide (LASIX) 40 MG tablet Take 40 mg by mouth 2 (two) times daily. 11/21/19   [provider]  ipratropium-albuterol (DUONEB) 0.5-2.5 (3) MG/3ML SOLN Inhale 3 mLs into the lungs every 6 (six) hours as needed. 01/02/20   Lurene Shadow, MD  omeprazole (PRILOSEC) 20 MG capsule Take 20 mg by mouth daily.    [provider]  SPIRIVA HANDIHALER 18 MCG inhalation capsule Place 1 capsule into inhaler and inhale daily. 11/21/19   [provider]     Allergies Patient has no known allergies.  Family History  Problem Relation Age of Onset  . Dementia Mother   . Heart attack Father     Social History Social History   Tobacco Use  . Smoking status: Current Every Day Smoker    Packs/day: 0.75    Years: 40.00    Pack years: 30.00    Types: Cigarettes  . Smokeless tobacco: Never Used  Vaping Use  . Vaping Use: Every day  Substance Use Topics  .  Alcohol use: No  . Drug use: No    Review of Systems  Constitutional: No fever/chills Eyes: No visual changes.  ENT: No sore throat. Cardiovascular: As above Respiratory: Pleurisy as above Gastrointestinal: No abdominal pain.  No nausea, no vomiting.   Genitourinary: Negative for dysuria. Musculoskeletal: Negative for calf pain or swelling Skin: Negative for rash. Neurological: Negative for headaches   ____________________________________________   PHYSICAL EXAM:  VITAL SIGNS: ED Triage Vitals  Enc Vitals Group     BP 02/22/20 1159 95/62     Pulse Rate 02/22/20 1159 (!) 122     Resp 02/22/20 1159 18     Temp 02/22/20 1159 100.2 F  (37.9 C)     Temp Source 02/22/20 1159 Oral     SpO2 02/22/20 1159 97 %     Weight 02/22/20 1201 117.9 kg (260 lb)     Height 02/22/20 1201 1.524 m (5')     Head Circumference --      Peak Flow --      Pain Score 02/22/20 1201 6     Pain Loc --      Pain Edu? --      Excl. in GC? --     Constitutional: Alert and oriented.   Nose: No congestion/rhinnorhea. Mouth/Throat: Mucous membranes are moist.    Cardiovascular: Normal rate, regular rhythm. Grossly normal heart sounds.  Good peripheral circulation. Respiratory: Normal respiratory effort.  No retractions.  Scattered wheezes Gastrointestinal: Soft and nontender. No distention.  No CVA tenderness.  Musculoskeletal: No lower extremity tenderness nor edema.  Warm and well perfused Neurologic:  Normal speech and language. No gross focal neurologic deficits are appreciated.  Skin:  Skin is warm, dry and intact. No rash noted. Psychiatric: Mood and affect are normal. Speech and behavior are normal.  ____________________________________________   LABS (all labs ordered are listed, but only abnormal results are displayed)  Labs Reviewed  BASIC METABOLIC PANEL - Abnormal; Notable for the following components:      Result Value   Sodium 132 (*)    Chloride 91 (*)    Glucose, Bld 125 (*)    All other components within normal limits  CBC - Abnormal; Notable for the following components:   WBC 10.6 (*)    RBC 6.27 (*)    Hemoglobin 16.5 (*)    HCT 52.4 (*)    RDW 18.8 (*)    All other components within normal limits  CULTURE, BLOOD (SINGLE)  RESPIRATORY PANEL BY RT PCR (FLU A&B, COVID)  LACTIC ACID, PLASMA  LACTIC ACID, PLASMA  POC URINE PREG, ED  TROPONIN I (HIGH SENSITIVITY)  TROPONIN I (HIGH SENSITIVITY)   ____________________________________________  EKG  ED ECG REPORT I, Jene Every, the attending physician, personally viewed and interpreted this ECG.  Date: 02/22/2020  Rhythm: Sinus tachycardia QRS Axis:  normal Intervals: normal ST/T Wave abnormalities: normal Narrative Interpretation: no evidence of acute ischemia  ____________________________________________  RADIOLOGY  Chest x-ray reviewed by me, cardiomegaly noted, pending radiology review CT angiography demonstrates effusions, no evidence of infiltrate suspicious for pulmonary edema ____________________________________________   PROCEDURES  Procedure(s) performed: No  Procedures   Critical Care performed: No ____________________________________________   INITIAL IMPRESSION / ASSESSMENT AND PLAN / ED COURSE  Pertinent labs & imaging results that were available during my care of the patient were reviewed by me and considered in my medical decision making (see chart for details).  Patient presents with increased cough, right-sided pleurisy and chest discomfort as described  above.  Noted temperature of 100.2 and pulse rate of 122 upon arrival.  Presentation is suspicious for possible pneumonia versus PE.  No infiltrate noted on chest x-ray reviewed by me, will send for CT angiography to rule out PE and to evaluate for pneumonia.  Cell count is normal.  No increased oxygen requirement reported.  Mild elevation white blood cell count but doubt sepsis given normal lactic CT angiography more consistent with pulmonary edema which is likely the cause of her tachycardia and shortness of breath.  We will give IV Lasix, consult the hospital service for admission.    ____________________________________________   FINAL CLINICAL IMPRESSION(S) / ED DIAGNOSES  Final diagnoses:  Acute pulmonary edema (HCC)  Atypical chest pain  Shortness of breath        Note:  This document was prepared using Dragon voice recognition software and may include unintentional dictation errors.   Jene Every, MD 02/22/20 Kristopher Oppenheim

## 2020-02-22 NOTE — ED Notes (Signed)
2 RN's unsuccessful with IV start attempts. Will order IV team

## 2020-02-22 NOTE — ED Notes (Signed)
Pt with mild congested productive cough. Reports small amount of clear sputum

## 2020-02-22 NOTE — ED Notes (Signed)
Pt reports SpO2 of 88-92 is baseline for her and has been for 10 years

## 2020-02-22 NOTE — ED Notes (Signed)
Pt to CT

## 2020-02-22 NOTE — ED Notes (Signed)
Pt chronically on 4L [place]

## 2020-02-22 NOTE — ED Notes (Signed)
Unable to start IV, called another RN to assist

## 2020-02-22 NOTE — ED Notes (Signed)
Pt reports last time she was admitted, she was on a significant amount of pain medication that was causing her to hallucinate. Pt wishes to avoid that this admission

## 2020-02-22 NOTE — ED Notes (Signed)
IV team at bedside 

## 2020-02-22 NOTE — ED Notes (Signed)
Awaiting med verification 

## 2020-02-23 ENCOUNTER — Encounter: Payer: Self-pay | Admitting: Family Medicine

## 2020-02-23 ENCOUNTER — Other Ambulatory Visit: Payer: Self-pay

## 2020-02-23 DIAGNOSIS — E785 Hyperlipidemia, unspecified: Secondary | ICD-10-CM

## 2020-02-23 LAB — BASIC METABOLIC PANEL
Anion gap: 11 (ref 5–15)
BUN: 11 mg/dL (ref 6–20)
CO2: 29 mmol/L (ref 22–32)
Calcium: 8.5 mg/dL — ABNORMAL LOW (ref 8.9–10.3)
Chloride: 95 mmol/L — ABNORMAL LOW (ref 98–111)
Creatinine, Ser: 0.92 mg/dL (ref 0.44–1.00)
GFR, Estimated: >60 mL/min (ref >60)
Glucose, Bld: 120 mg/dL — ABNORMAL HIGH (ref 70–99)
Potassium: 3.4 mmol/L — ABNORMAL LOW (ref 3.5–5.1)
Sodium: 135 mmol/L (ref 135–145)

## 2020-02-23 LAB — CBC
HCT: 47 % — ABNORMAL HIGH (ref 36.0–46.0)
Hemoglobin: 15 g/dL (ref 12.0–15.0)
MCH: 27 pg (ref 26.0–34.0)
MCHC: 31.9 g/dL (ref 30.0–36.0)
MCV: 84.5 fL (ref 80.0–100.0)
Platelets: 230 10*3/uL (ref 150–400)
RBC: 5.56 MIL/uL — ABNORMAL HIGH (ref 3.87–5.11)
RDW: 18.8 % — ABNORMAL HIGH (ref 11.5–15.5)
WBC: 9.9 10*3/uL (ref 4.0–10.5)
nRBC: 0 % (ref 0.0–0.2)

## 2020-02-23 LAB — TSH: TSH: 2.03 u[IU]/mL (ref 0.350–4.500)

## 2020-02-23 LAB — BRAIN NATRIURETIC PEPTIDE: B Natriuretic Peptide: 80.4 pg/mL (ref 0.0–100.0)

## 2020-02-23 MED ORDER — POTASSIUM CHLORIDE CRYS ER 20 MEQ PO TBCR
40.0000 meq | EXTENDED_RELEASE_TABLET | ORAL | Status: AC
  Administered 2020-02-23 (×2): 40 meq via ORAL
  Filled 2020-02-23 (×2): qty 2

## 2020-02-23 MED ORDER — INFLUENZA VAC SPLIT QUAD 0.5 ML IM SUSY
0.5000 mL | PREFILLED_SYRINGE | INTRAMUSCULAR | Status: AC
  Administered 2020-02-24: 0.5 mL via INTRAMUSCULAR
  Filled 2020-02-23: qty 0.5

## 2020-02-23 MED ORDER — ALBUTEROL SULFATE HFA 108 (90 BASE) MCG/ACT IN AERS
2.0000 | INHALATION_SPRAY | Freq: Four times a day (QID) | RESPIRATORY_TRACT | Status: DC | PRN
  Filled 2020-02-23: qty 6.7

## 2020-02-23 MED ORDER — FUROSEMIDE 40 MG PO TABS
80.0000 mg | ORAL_TABLET | Freq: Every day | ORAL | Status: DC
  Administered 2020-02-24: 80 mg via ORAL
  Filled 2020-02-23: qty 2

## 2020-02-23 MED ORDER — ENOXAPARIN SODIUM 60 MG/0.6ML ~~LOC~~ SOLN
0.5000 mg/kg | SUBCUTANEOUS | Status: DC
  Filled 2020-02-23: qty 0.6

## 2020-02-23 MED ORDER — MELATONIN 5 MG PO TABS
5.0000 mg | ORAL_TABLET | Freq: Every day | ORAL | Status: DC
  Administered 2020-02-23: 5 mg via ORAL
  Filled 2020-02-23 (×3): qty 1

## 2020-02-23 MED ORDER — FUROSEMIDE 10 MG/ML IJ SOLN: 60.0000 mg | Freq: Every day | INTRAMUSCULAR | Status: DC

## 2020-02-23 MED ORDER — FLUTICASONE FUROATE-VILANTEROL 200-25 MCG/INH IN AEPB
1.0000 | INHALATION_SPRAY | Freq: Every day | RESPIRATORY_TRACT | Status: DC
  Filled 2020-02-23: qty 28

## 2020-02-23 MED ORDER — METHOCARBAMOL 500 MG PO TABS
500.0000 mg | ORAL_TABLET | Freq: Four times a day (QID) | ORAL | Status: DC | PRN
  Administered 2020-02-23: 500 mg via ORAL
  Filled 2020-02-23 (×3): qty 1

## 2020-02-23 MED ORDER — ALBUTEROL SULFATE (2.5 MG/3ML) 0.083% IN NEBU: 2.5000 mg | INHALATION_SOLUTION | Freq: Four times a day (QID) | RESPIRATORY_TRACT | Status: DC | PRN

## 2020-02-23 MED ORDER — ENOXAPARIN SODIUM 60 MG/0.6ML ~~LOC~~ SOLN
60.0000 mg | SUBCUTANEOUS | Status: DC
  Administered 2020-02-23: 60 mg via SUBCUTANEOUS
  Filled 2020-02-23 (×2): qty 0.6

## 2020-02-23 MED ORDER — PANTOPRAZOLE SODIUM 40 MG PO TBEC
40.0000 mg | DELAYED_RELEASE_TABLET | Freq: Every day | ORAL | Status: DC
  Administered 2020-02-23 – 2020-02-24 (×2): 40 mg via ORAL
  Filled 2020-02-23 (×2): qty 1

## 2020-02-23 MED ORDER — FUROSEMIDE 10 MG/ML IJ SOLN: 40.0000 mg | Freq: Two times a day (BID) | INTRAMUSCULAR | Status: DC

## 2020-02-23 MED ORDER — ROSUVASTATIN CALCIUM 10 MG PO TABS
20.0000 mg | ORAL_TABLET | Freq: Every day | ORAL | Status: DC
  Administered 2020-02-23 – 2020-02-24 (×2): 20 mg via ORAL
  Filled 2020-02-23: qty 2
  Filled 2020-02-23: qty 1

## 2020-02-23 MED ORDER — FUROSEMIDE 10 MG/ML IJ SOLN
60.0000 mg | Freq: Every day | INTRAMUSCULAR | Status: DC
  Administered 2020-02-23: 60 mg via INTRAVENOUS
  Filled 2020-02-23: qty 8

## 2020-02-23 MED ORDER — ORAL CARE MOUTH RINSE
15.0000 mL | Freq: Two times a day (BID) | OROMUCOSAL | Status: DC
  Administered 2020-02-23: 15 mL via OROMUCOSAL

## 2020-02-23 MED ORDER — ASPIRIN EC 81 MG PO TBEC
81.0000 mg | DELAYED_RELEASE_TABLET | Freq: Every day | ORAL | Status: DC
  Administered 2020-02-23 – 2020-02-24 (×2): 81 mg via ORAL
  Filled 2020-02-23 (×2): qty 1

## 2020-02-23 NOTE — Progress Notes (Deleted)
   02/23/20 1608  Assess: MEWS Score  Temp 99.5 F (37.5 C)  BP 93/64  Pulse Rate (!) 111  Resp 20  Level of Consciousness Alert  SpO2 92 %  O2 Device Nasal Cannula  O2 Flow Rate (L/min) 4 L/min  Assess: MEWS Score  MEWS Temp 0  MEWS Systolic 1  MEWS Pulse 2  MEWS RR 0  MEWS LOC 0  MEWS Score 3  MEWS Score Color Yellow  Assess: if the MEWS score is Yellow or Red  Were vital signs taken at a resting state? Yes  Focused Assessment Change from prior assessment (see assessment flowsheet) (new admit, initial assessment.)  Early Detection of Sepsis Score *See Row Information* Low  MEWS guidelines implemented *See Row Information* Yes  Treat  MEWS Interventions Administered scheduled meds/treatments  Take Vital Signs  Increase Vital Sign Frequency  Yellow: Q 2hr X 2 then Q 4hr X 2, if remains yellow, continue Q 4hrs  Escalate  MEWS: Escalate Yellow: discuss with charge nurse/RN and consider discussing with provider and RRT  Notify: Charge Nurse/RN  Name of Charge Nurse/RN Notified Tammy  Date Charge Nurse/RN Notified 02/23/20  Time Charge Nurse/RN Notified 1608

## 2020-02-23 NOTE — Plan of Care (Signed)

## 2020-02-23 NOTE — ED Notes (Signed)
Lab has been called to draw morning labs; pt has been added to their list 

## 2020-02-23 NOTE — Progress Notes (Signed)
PHARMACIST - PHYSICIAN COMMUNICATION  CONCERNING:  Enoxaparin (Lovenox) for DVT Prophylaxis    RECOMMENDATION: Patient was prescribed enoxaprin 40mg  q24 hours for VTE prophylaxis.   Filed Weights   02/22/20 1201  Weight: 117.9 kg (260 lb)    Body mass index is 50.78 kg/m.  Estimated Creatinine Clearance: 80.3 mL/min (by C-G formula based on SCr of 0.92 mg/dL).   Based on [facility] policy patient is candidate for enoxaparin 0.5mg /kg TBW SQ every 24 hours based on BMI being >30.  DESCRIPTION: Pharmacy has adjusted enoxaparin dose per [facility] policy.  Patient is now receiving enoxaparin 60 mg every 24 hours    [facility], PharmD, BCPS Clinical Pharmacist 02/23/2020 9:55 AM

## 2020-02-23 NOTE — Progress Notes (Signed)
PROGRESS NOTE    [name]  GGY:[id number] DOB: [birth year] DOA: 02/22/2020 PCP: Cathlean Marseilles, MD   Chief Complain: Chest pain  Brief Narrative: Patient is a 54 year old female with history of COPD on home oxygen at 4 L/min, chronic diastolic congestive heart failure, seizure disorder, GERD, hyperlipidemia, anxiety who presents with complaints of chest pain.  She was also complaining of worsening cough.  Chest pain was worse with deep respiration cough and it were reproducible with palpation of anterior chest.  Patient continues to smoke.  Currently she is on hospice care at home.  On presentations she had low-grade fever of 101 2, tachycardic, tachypneic but not requiring more oxygen than her baseline.  CT chest did not show any evidence of PE but showed small bilateral pleural effusion and groundglass opacities suggestive of pulmonary edema.  Patient was admitted for the management of exacerbation of CHF/COPD.  Assessment & Plan:   Principal Problem:   Acute CHF (congestive heart failure) (HCC) Active Problems:   Chest pain   COPD exacerbation (HCC)   HLD (hyperlipidemia)   Morbid obesity with BMI of 50.0-59.9, adult (HCC)   Chest pain: Atypical chest pain, associated with cough and deep breathing, reproducible on palpation of anterior chest.  No significant elevation in troponin.  EKG showed nonspecific changes.  Most likely this is musculoskeletal in etiology.  Continue muscle relaxants.  Acute on chronic diastolic congestive heart failure: Presented with increased cough, dyspnea.  Chest imaging showed stable pulmonary edema/congestion.  Echo 8/21 showed ejection fraction of 65 to 70%.  On Lasix 80 mg daily at home.  She was started on  Lasix IV 60 daily.  Her blood pressure is soft so will hold diuretics for now and resume her oral diuretics from tomorrow.  Continue to monitor weight, input and output. She is in sinus tachycardia, monitor on telemetry.Normal BNP, but it  might be an accurate due to her obesity.  She looks slightly  volume overloaded during my evaluation  Acute COPD exacerbation: Presented with increased cough, shortness of breath.  Started on Rocephin and azithromycin on admission.  No evidence of pneumonia as per CT imagings.  We will continue azithromycin only.  Continue bronchodilators as needed.  She is on chronic home oxygen at 4 L/min and she is at her baseline requirement at present.  Sinus tachycardia: Persistently sinus tachycardic.  Will check TSH.  Hyperlipidemia: Continue statin  Hypokalemia: Supplemented with potassium.  Continue to monitor potassium while on Lasix.  Goals of care: Patient is currently enrolled with hospice at home.  But she is full code.  Goal is to discharge her back to home with hospice care. Morbid obesity: BMI of 50.           DVT prophylaxis: Lovenox Code Status: Full Family Communication: None at bed side Status is: Inpatient  Remains inpatient appropriate because:Inpatient level of care appropriate due to severity of illness   Dispo: The patient is from: Home              Anticipated d/c is to: Home              Anticipated d/c date is:1- 2 days              Patient currently is not medically stable to d/c.     Consultants: None  Procedures:None  Antimicrobials:  Anti-infectives (From admission, onward)   Start     Dose/Rate Route Frequency Ordered Stop   02/22/20 2215  azithromycin (  ZITHROMAX) 500 mg in sodium chloride 0.9 % 250 mL IVPB        500 mg 250 mL/hr over 60 Minutes Intravenous Every 24 hours 02/22/20 2206     02/22/20 2130  cefTRIAXone (ROCEPHIN) 1 g in sodium chloride 0.9 % 100 mL IVPB        1 g 200 mL/hr over 30 Minutes Intravenous Every 24 hours 02/22/20 2122 02/27/20 2129      Subjective:  Patient seen and examined at the bedside this morning the emergency department.  During my evaluation she was hemodynamically stable, but in sinus tachycardia.  She says  she feels great this morning.  She is on 4 L of oxygen per minute which is her baseline.  She denies any cough or increased shortness of breath.  Eager to go home.  Objective: Vitals:   02/23/20 0230 02/23/20 0300 02/23/20 0530 02/23/20 0630  BP: 109/74 101/65 96/64 103/73  Pulse: (!) 115 (!) 115 (!) 115 (!) 116  Resp: (!) 27 (!) 26 (!) 26 (!) 27  Temp:      TempSrc:      SpO2: 92% 93% 91% 91%  Weight:      Height:        Intake/Output Summary (Last 24 hours) at 02/23/2020 1696 Last data filed at 02/22/2020 2329 Gross per 24 hour  Intake 100 ml  Output --  Net 100 ml   Filed Weights   02/22/20 1201  Weight: 117.9 kg    Examination:  General exam: Appears calm and comfortable ,Not in distress, morbidly obese HEENT:PERRL,Oral mucosa moist, Ear/Nose normal on gross exam Respiratory system: Bilateral fine basal crackles  cardiovascular system: Sinus tachycardia, no JVD, murmurs, rubs, gallops or clicks.  Gastrointestinal system: Abdomen is nondistended, soft and nontender. No organomegaly or masses felt. Normal bowel sounds heard. Central nervous system: Alert and oriented. No focal neurological deficits. Extremities: Trace bilateral lower extremity edema, no clubbing ,no cyanosis Skin: No rashes, lesions or ulcers,no icterus ,no pallor   Data Reviewed: I have personally reviewed following labs and imaging studies  CBC: Recent Labs  Lab 02/22/20 1235 02/23/20 0707  WBC 10.6* 9.9  HGB 16.5* 15.0  HCT 52.4* 47.0*  MCV 83.6 84.5  PLT 228 230   Basic Metabolic Panel: Recent Labs  Lab 02/22/20 1235 02/23/20 0707  NA 132* 135  K 3.7 3.4*  CL 91* 95*  CO2 27 29  GLUCOSE 125* 120*  BUN 10 11  CREATININE 0.79 0.92  CALCIUM 8.9 8.5*   GFR: Estimated Creatinine Clearance: 80.3 mL/min (by C-G formula based on SCr of 0.92 mg/dL). Liver Function Tests: No results for input(s): AST, ALT, ALKPHOS, BILITOT, PROT, ALBUMIN in the last 168 hours. No results for input(s):  LIPASE, AMYLASE in the last 168 hours. No results for input(s): AMMONIA in the last 168 hours. Coagulation Profile: No results for input(s): INR, PROTIME in the last 168 hours. Cardiac Enzymes: No results for input(s): CKTOTAL, CKMB, CKMBINDEX, TROPONINI in the last 168 hours. BNP (last 3 results) No results for input(s): PROBNP in the last 8760 hours. HbA1C: No results for input(s): HGBA1C in the last 72 hours. CBG: No results for input(s): GLUCAP in the last 168 hours. Lipid Profile: No results for input(s): CHOL, HDL, LDLCALC, TRIG, CHOLHDL, LDLDIRECT in the last 72 hours. Thyroid Function Tests: No results for input(s): TSH, T4TOTAL, FREET4, T3FREE, THYROIDAB in the last 72 hours. Anemia Panel: No results for input(s): VITAMINB12, FOLATE, FERRITIN, TIBC, IRON, RETICCTPCT in the  last 72 hours. Sepsis Labs: Recent Labs  Lab 02/22/20 1235 02/22/20 1720  LATICACIDVEN 1.1 1.2    Recent Results (from the past 240 hour(s))  Blood culture (single)     Status: None (Preliminary result)   Collection Time: 02/22/20 12:35 PM   Specimen: BLOOD  Result Value Ref Range Status   Specimen Description BLOOD RIGHT HAND  Final   Special Requests   Final    BOTTLES DRAWN AEROBIC AND ANAEROBIC Blood Culture results may not be optimal due to an excessive volume of blood received in culture bottles   Culture   Final    NO GROWTH < 24 HOURS Performed at [facility], [address] [place], [place] [address]    Report Status PENDING  Incomplete  Respiratory Panel by RT PCR (Flu A&B, Covid) - Nasopharyngeal Swab     Status: None   Collection Time: 02/22/20  8:05 PM   Specimen: Nasopharyngeal Swab  Result Value Ref Range Status   SARS Coronavirus 2 by RT PCR NEGATIVE NEGATIVE Final    Comment: (NOTE) SARS-CoV-2 target nucleic acids are NOT DETECTED.  The SARS-CoV-2 RNA is generally detectable in upper respiratoy specimens during the acute phase of infection. The  lowest concentration of SARS-CoV-2 viral copies this assay can detect is 131 copies/mL. A negative result does not preclude SARS-Cov-2 infection and should not be used as the sole basis for treatment or other patient management decisions. A negative result may occur with  improper specimen collection/handling, submission of specimen other than nasopharyngeal swab, presence of viral mutation(s) within the areas targeted by this assay, and inadequate number of viral copies (<131 copies/mL). A negative result must be combined with clinical observations, patient history, and epidemiological information. The expected result is Negative.  Fact Sheet for Patients:  https://www.moore.com/  Fact Sheet for Healthcare Providers:  https://www.young.biz/  This test is no t yet approved or cleared by the [place] FDA and  has been authorized for detection and/or diagnosis of SARS-CoV-2 by FDA under an Emergency Use Authorization (EUA). This EUA will remain  in effect (meaning this test can be used) for the duration of the COVID-19 declaration under Section 564(b)(1) of the Act, 21 U.S.C. section 360bbb-3(b)(1), unless the authorization is terminated or revoked sooner.     Influenza A by PCR NEGATIVE NEGATIVE Final   Influenza B by PCR NEGATIVE NEGATIVE Final    Comment: (NOTE) The Xpert Xpress SARS-CoV-2/FLU/RSV assay is intended as an aid in  the diagnosis of influenza from Nasopharyngeal swab specimens and  should not be used as a sole basis for treatment. Nasal washings and  aspirates are unacceptable for Xpert Xpress SARS-CoV-2/FLU/RSV  testing.  Fact Sheet for Patients: https://www.moore.com/  Fact Sheet for Healthcare Providers: https://www.young.biz/  This test is not yet approved or cleared by the [place] FDA and  has been authorized for detection and/or diagnosis of SARS-CoV-2 by  FDA under  an Emergency Use Authorization (EUA). This EUA will remain  in effect (meaning this test can be used) for the duration of the  Covid-19 declaration under Section 564(b)(1) of the Act, 21  U.S.C. section 360bbb-3(b)(1), unless the authorization is  terminated or revoked. Performed at [facility], [address], [place], [place] [address]          Radiology Studies: DG Chest 2 View  Result Date: 02/22/2020 CLINICAL DATA:  Patient with chest wall pain and tenderness. EXAM: CHEST - 2 VIEW COMPARISON:  Chest radiograph 12/29/2019. FINDINGS:  Interval extubation and removal of enteric tube. Stable cardiomegaly. Interval decrease in bilateral interstitial opacities. Persistent small bilateral pleural effusions. Thoracic spine degenerative changes. IMPRESSION: Cardiomegaly with improving interstitial opacities. Electronically Signed   By: Annia Beltrew  Davis M.D.   On: 02/22/2020 13:44   CT Angio Chest PE W and/or Wo Contrast  Result Date: 02/22/2020 CLINICAL DATA:  Chest wall pain EXAM: CT ANGIOGRAPHY CHEST WITH CONTRAST TECHNIQUE: Multidetector CT imaging of the chest was performed using the standard protocol during bolus administration of intravenous contrast. Multiplanar CT image reconstructions and MIPs were obtained to evaluate the vascular anatomy. CONTRAST:  75mL OMNIPAQUE IOHEXOL 350 MG/ML SOLN COMPARISON:  July 31, 2017 FINDINGS: Cardiovascular: Satisfactory opacification of the pulmonary arteries to the segmental level. No evidence of pulmonary embolism. Cardiomegaly. No pericardial effusion. Atherosclerotic calcifications of the aorta. LEFT-sided coronary artery atherosclerotic calcifications. Mediastinum/Nodes: Revisualization of enlarged mediastinal lymph nodes. Prevascular lymph node measures 17 mm, unchanged (series 5, image 88). There is confluent soft tissue encasing the trachea and bifurcation. This is similar comparison to prior. Confluent subcarinal lymph node measures 23 mm  in the short axis (series 5, image 134). Peripherally calcified RIGHT thyroid nodule measures 15 mm. Lungs/Pleura: Small bilateral pleural effusions. Moderate centrilobular emphysema on a background of diffuse ground-glass opacity. RIGHT middle lobe pulmonary nodule measures 6 mm, unchanged (series 5, image 142). Bronchial wall thickening. Bibasilar enhancing consolidative opacities most consistent with atelectasis. Interlobular septal thickening. Upper Abdomen: No acute abnormality. Musculoskeletal: No chest wall abnormality. No acute or significant osseous findings. Review of the MIP images confirms the above findings. IMPRESSION: 1. No evidence of pulmonary embolism. 2. Small bilateral pleural effusions with bibasilar atelectasis. 3. Revisualization of enlarged mediastinal lymph nodes with confluent soft tissue encasing the trachea and bifurcation, similar in comparison to prior study. This is nonspecific and could be further evaluated with dedicated PET-CT after resolution of acute symptomatology if clinically indicated. 4. Moderate centrilobular emphysema on a background of diffuse ground-glass opacity and bronchial wall thickening likely reflecting superimposed pulmonary edema. Aortic Atherosclerosis (ICD10-I70.0) and Emphysema (ICD10-J43.9). Electronically Signed   By: Meda KlinefelterStephanie  Peacock MD   On: 02/22/2020 18:44        Scheduled Meds: . aspirin EC  81 mg Oral Daily  . enoxaparin (LOVENOX) injection  40 mg Subcutaneous Q24H  . fluticasone furoate-vilanterol  1 puff Inhalation Daily  . furosemide  60 mg Intravenous Daily  . ipratropium-albuterol  3 mL Nebulization Q6H  . melatonin  5 mg Oral QHS  . pantoprazole  40 mg Oral Daily  . rosuvastatin  20 mg Oral Daily   Continuous Infusions: . azithromycin Stopped (02/23/20 0530)  . cefTRIAXone (ROCEPHIN)  IV Stopped (02/22/20 2329)     LOS: 1 day    Time spent: 35 mins.More than 50% of that time was spent in counseling and/or coordination  of care.      Burnadette PopAmrit Irish Breisch, MD [facility] P10/03/2020, 8:21 AM

## 2020-02-23 NOTE — ED Notes (Signed)
Report received from Noah RN

## 2020-02-23 NOTE — ED Notes (Signed)
Patient provided with hospital bed for comfort; bedside commode provided due to difficulty maintaining O2 sats while ambulating

## 2020-02-23 NOTE — Progress Notes (Signed)
[facility] Room 250 [facility]) Hospital Liaison Hospitalized Hospice patient RN note:  [name] is a current hospice patient with a terminal diagnosis of COPD. She presented to the ED with concerns of chest pain. [facility] was not notified prior to transport. Patient was admitted 10.10.21 with a diagnosis of CHF exacerbation. Per Dr. Dan Humphreys with [facility], this is a related admission. Patient is a Full code.   Patient reports that about 4 days ago she started to have worsening cough from her baseline.  States she coughed so hard that her left side beneath her breast began to hurt.    She continued to have her chronic cough throughout the week but then last night began to have worsening chest pain with sitting or lying down.  Chest pain is worse with deep respiration and cough.  Pain was better after she took some muscle relaxer at home. She had mildly elevated temperature of 100.2, was tachycardic and tachypneic but remained on her chronic 4 L via nasal cannula. CTA chest showed no evidence of PE but there is small bilateral pleural effusion with bibasilar atelectasis.  There are also findings reflecting likely superimposed pulmonary edema.  Visited patient at bedside in the ED. She was sitting on the side of the bed and did not appear to be in any distress. She explained what had transpired to bring her to the ED. At that time, she began a scheduled breathing treatment. She remains on 4 liters [place] and sats are at 92%  VS: BP: 93/64; HR 111; Resp 20; temp 99.5  I&O: total intake since admit IV  Abnormal labs: Potassium: 3.4 (L) Chloride: 95 (L) Glucose: 120 (H) Calcium: 8.5 (L) RBC: 5.56 (H) HCT: 47.0 (H) RDW: 18.8 (H)  Diagnostics: CTA Chest IMPRESSION: 1. No evidence of pulmonary embolism. 2. Small bilateral pleural effusions with bibasilar atelectasis. 3. Revisualization of enlarged mediastinal lymph nodes with confluent soft tissue encasing the trachea and  bifurcation, similar in comparison to prior study. This is nonspecific and could be further evaluated with dedicated PET-CT after resolution of acute symptomatology if clinically indicated. 4. Moderate centrilobular emphysema on a background of diffuse ground-glass opacity and bronchial wall thickening likely reflecting superimposed pulmonary edema.  IV/PRN Meds: ipratropium-albuterol (DUONEB) 0.5-2.5 (3) MG/3ML nebulizer solution 3 mL Dose: 3 mL Freq: Every 6 hours Route: NEBULIZATION (received 3 doses)  azithromycin (ZITHROMAX) 500 mg in sodium chloride 0.9 % 250 mL IVPB Dose: 500 mg Freq: Every 24 hours Route: IV (received one dose)  Problem List: Chest pain: Atypical chest pain, associated with cough and deep breathing, reproducible on palpation of anterior chest.  No significant elevation in troponin.  EKG showed nonspecific changes.  Most likely this is musculoskeletal in etiology.  Continue muscle relaxants.  Acute on chronic diastolic congestive heart failure: Presented with increased cough, dyspnea.  Chest imaging showed stable pulmonary edema/congestion.  Echo 8/21 showed ejection fraction of 65 to 70%.  On Lasix 80 mg daily at home.  She was started on  Lasix IV 60 daily.  Her blood pressure is soft so will hold diuretics for now and resume her oral diuretics from tomorrow.  Continue to monitor weight, input and output. She is in sinus tachycardia, monitor on telemetry.Normal BNP, but it might be an accurate due to her obesity.  She looks slightly  volume overloaded during my evaluation  Acute COPD exacerbation: Presented with increased cough, shortness of breath.  Started on Rocephin and azithromycin on admission.  No evidence  of pneumonia as per CT imagings.  We will continue azithromycin only.  Continue bronchodilators as needed.  She is on chronic home oxygen at 4 L/min and she is at her baseline requirement at present.  Sinus tachycardia: Persistently sinus tachycardic.   Will check TSH.  Hyperlipidemia: Continue statin  Hypokalemia: Supplemented with potassium.  Continue to monitor potassium while on Lasix.  Discharge Planning: Ongoing  Family Contact: Spoke with patient and significant other, [name] at bedside.  IDG: Updated  Goals of care: Unclear  Please call with any hospice related questions or concerns.  [name], RN [facility] Liaison [phone]

## 2020-02-23 NOTE — H&P (Addendum)
History and Physical    KRYSTEN VERONICA HYW:[id number] DOB: 1965-05-21 DOA: 02/22/2020  PCP: Cathlean Marseilles, MD  Patient coming from: Home, boyfriend at bedside  I have personally briefly reviewed patient's old medical records in [facility]  Chief Complaint: Chest pain  HPI: CHALYN AMESCUA is a 54 y.o. female with medical history significant for COPD with chronic hypoxemia on 4 L, chronic diastolic heart failure, history of seizure, GERD, hyperlipidemia, anxiety who presents with concerns of worsening chest pain.  Patient reports that about 4 days ago she started to have worsening cough from her baseline.  States she coughed so hard that her left side beneath her breast began to hurt.  However she had home health nurse check up on her and told her that her breathing was okay and does not have any wheezing.  She continued to have her chronic cough throughout the week but then last night began to have worsening chest pain with sitting or lying down.  Chest pain is worse with deep respiration and cough.  It is reproducible with palpation of the anterior chest.  Pain is better after she took some muscle relaxer at home.  Her cough is productive of clear sputum.  She denies any fever.  No new lower extremity edema.  Patient is a current tobacco user.  Granddaughter who lives with her recently had a cold.  Of note patient was most recently admitted from 12/20/2019 to 01/02/2020 for acute on chronic hypoxemic and hypercarbic respiratory failure requiring mechanical ventilation secondary to COPD exacerbation. She was discharged home on hospice care at that time.  ED Course: She had mildly elevated temperature of 100.2, was tachycardic and tachypneic but remained on her chronic 4 L via nasal cannula. Had mild leukocytosis of 10.6.  Mild hyponatremia 132, BG of 125.  Troponin flat at 3x2.  CTA chest showed no evidence of PE but there is small bilateral pleural effusion with bibasilar atelectasis.   There are also findings reflecting likely superimposed pulmonary edema.  Review of Systems: Constitutional: No Weight Change, No Fever ENT/Mouth: No sore throat, No Rhinorrhea Eyes: No Eye Pain, No Vision Changes Cardiovascular: + Chest Pain, no SOB, No PND, No Dyspnea on Exertion, No Orthopnea,  No Edema Respiratory: + Cough, + Sputum, No Wheezing, no Dyspnea  Gastrointestinal: No Nausea, No Vomiting, No Diarrhea Genitourinary: no Urinary Incontinence, No Urgency, No Flank Pain Musculoskeletal: No Arthralgias, No Myalgias Skin: No Skin Lesions, No Pruritus, Neuro: no Weakness, No Numbness Psych: No Anxiety/Panic, No Depression, no decrease appetite Heme/Lymph: No Bruising, No Bleeding  Past Medical History:  Diagnosis Date  . Anxiety   . COPD (chronic obstructive pulmonary disease) (HCC)   . Essential hypertension   . History of echocardiogram    a. 08/2013 Echo: EF 55-60%, impaired LV relaxation, mild LVH, nl RV fxn, nl RVSP, mild TR; b. 05/2017 Echo: EF 65-70%, no rwma, nl RV fxn.  . Morbid obesity (HCC)   . Tobacco abuse     Past Surgical History:  Procedure Laterality Date  . ABDOMINAL HYSTERECTOMY    . CHOLECYSTECTOMY    . EXTUBATION (ENDOTRACHEAL) IN OR N/A 08/14/2017   Procedure: EXTUBATION (ENDOTRACHEAL) IN OR;  Surgeon: Geanie Logan, MD;  Location: [facility] ORS;  Service: ENT;  Laterality: N/A;  . HERNIA REPAIR    . TEE WITHOUT CARDIOVERSION N/A 09/20/2017   Procedure: TRANSESOPHAGEAL ECHOCARDIOGRAM (TEE);  Surgeon: Laurier Nancy, MD;  Location: [facility] ORS;  Service: Cardiovascular;  Laterality: N/A;  reports that she has been smoking cigarettes. She has a 30.00 pack-year smoking history. She has never used smokeless tobacco. She reports that she does not drink alcohol and does not use drugs. Social History  No Known Allergies  Family History  Problem Relation Age of Onset  . Dementia Mother   . Heart attack Father      Prior to Admission medications    Medication Sig Start Date End Date Taking? Authorizing Provider  ADVAIR HFA 115-21 MCG/ACT inhaler Inhale 2 puffs into the lungs 2 (two) times daily. 11/21/19  Yes [provider]  aspirin EC 81 MG tablet Take 81 mg by mouth daily. Swallow whole.   Yes [provider]  furosemide (LASIX) 40 MG tablet Take 40 mg by mouth 2 (two) times daily. 11/21/19  Yes [provider]  omeprazole (PRILOSEC) 20 MG capsule Take 20 mg by mouth daily.   Yes [provider]  rosuvastatin (CRESTOR) 20 MG tablet Take 20 mg by mouth daily.   Yes [provider]  SPIRIVA HANDIHALER 18 MCG inhalation capsule Place 1 capsule into inhaler and inhale daily. 11/21/19  Yes [provider]  albuterol (PROVENTIL HFA;VENTOLIN HFA) 108 (90 Base) MCG/ACT inhaler Inhale 2 puffs into the lungs every 6 (six) hours as needed for wheezing or shortness of breath. 06/12/17   Gouru, Aruna, MD  fluticasone (FLONASE) 50 MCG/ACT nasal spray Place 1 spray into both nostrils daily. 02/21/20   [provider]  ipratropium-albuterol (DUONEB) 0.5-2.5 (3) MG/3ML SOLN Inhale 3 mLs into the lungs every 6 (six) hours as needed. 01/02/20   Lurene ShadowAyiku, Bernard, MD    Physical Exam: Vitals:   02/22/20 2215 02/22/20 2245 02/22/20 2300 02/22/20 2325  BP:    98/69  Pulse: (!) 113 (!) 111  (!) 117  Resp:    (!) 25  Temp:   99.3 F (37.4 C)   TempSrc:   Oral   SpO2: 90% 91%  (!) 89%  Weight:      Height:        Constitutional: NAD, calm, comfortable, morbidly obese female sitting upright in bed Vitals:   02/22/20 2215 02/22/20 2245 02/22/20 2300 02/22/20 2325  BP:    98/69  Pulse: (!) 113 (!) 111  (!) 117  Resp:    (!) 25  Temp:   99.3 F (37.4 C)   TempSrc:   Oral   SpO2: 90% 91%  (!) 89%  Weight:      Height:       Eyes: PERRL, lids and conjunctivae normal ENMT: Mucous membranes are moist.  Neck: normal, supple, no masses, no thyromegaly Respiratory: Bibasilar crackles. Normal  respiratory effort on 4 L via nasal cannula. No accessory muscle use.  Cardiovascular: Tachycardia, no murmurs / rubs / gallops. No extremity edema. 2+ pedal pulses. No carotid bruits.  Abdomen: no tenderness, no masses palpated.  Bowel sounds positive.  Musculoskeletal: no clubbing / cyanosis. No joint deformity upper and lower extremities. Good ROM, no contractures. Normal muscle tone.  Skin: no rashes, lesions, ulcers. No induration Neurologic: CN 2-12 grossly intact. Sensation intact. Strength 5/5 in all 4.  Psychiatric: Normal judgment and insight. Alert and oriented x 3. Normal mood.     Labs on Admission: I have personally reviewed following labs and imaging studies  CBC: Recent Labs  Lab 02/22/20 1235  WBC 10.6*  HGB 16.5*  HCT 52.4*  MCV 83.6  PLT 228   Basic Metabolic Panel: Recent Labs  Lab 02/22/20  1235  NA 132*  K 3.7  CL 91*  CO2 27  GLUCOSE 125*  BUN 10  CREATININE 0.79  CALCIUM 8.9   GFR: Estimated Creatinine Clearance: 92.3 mL/min (by C-G formula based on SCr of 0.79 mg/dL). Liver Function Tests: No results for input(s): AST, ALT, ALKPHOS, BILITOT, PROT, ALBUMIN in the last 168 hours. No results for input(s): LIPASE, AMYLASE in the last 168 hours. No results for input(s): AMMONIA in the last 168 hours. Coagulation Profile: No results for input(s): INR, PROTIME in the last 168 hours. Cardiac Enzymes: No results for input(s): CKTOTAL, CKMB, CKMBINDEX, TROPONINI in the last 168 hours. BNP (last 3 results) No results for input(s): PROBNP in the last 8760 hours. HbA1C: No results for input(s): HGBA1C in the last 72 hours. CBG: No results for input(s): GLUCAP in the last 168 hours. Lipid Profile: No results for input(s): CHOL, HDL, LDLCALC, TRIG, CHOLHDL, LDLDIRECT in the last 72 hours. Thyroid Function Tests: No results for input(s): TSH, T4TOTAL, FREET4, T3FREE, THYROIDAB in the last 72 hours. Anemia Panel: No results for input(s): VITAMINB12,  FOLATE, FERRITIN, TIBC, IRON, RETICCTPCT in the last 72 hours. Urine analysis:    Component Value Date/Time   COLORURINE Yellow 03/26/2014 1245   APPEARANCEUR Clear 03/26/2014 1245   LABSPEC 1.011 03/26/2014 1245   PHURINE 6.0 03/26/2014 1245   GLUCOSEU Negative 03/26/2014 1245   HGBUR Negative 03/26/2014 1245   BILIRUBINUR Negative 03/26/2014 1245   KETONESUR Negative 03/26/2014 1245   PROTEINUR Negative 03/26/2014 1245   NITRITE Negative 03/26/2014 1245   LEUKOCYTESUR Negative 03/26/2014 1245    Radiological Exams on Admission: DG Chest 2 View  Result Date: 02/22/2020 CLINICAL DATA:  Patient with chest wall pain and tenderness. EXAM: CHEST - 2 VIEW COMPARISON:  Chest radiograph 12/29/2019. FINDINGS: Interval extubation and removal of enteric tube. Stable cardiomegaly. Interval decrease in bilateral interstitial opacities. Persistent small bilateral pleural effusions. Thoracic spine degenerative changes. IMPRESSION: Cardiomegaly with improving interstitial opacities. Electronically Signed   By: Annia Belt M.D.   On: 02/22/2020 13:44   CT Angio Chest PE W and/or Wo Contrast  Result Date: 02/22/2020 CLINICAL DATA:  Chest wall pain EXAM: CT ANGIOGRAPHY CHEST WITH CONTRAST TECHNIQUE: Multidetector CT imaging of the chest was performed using the standard protocol during bolus administration of intravenous contrast. Multiplanar CT image reconstructions and MIPs were obtained to evaluate the vascular anatomy. CONTRAST:  92mL OMNIPAQUE IOHEXOL 350 MG/ML SOLN COMPARISON:  July 31, 2017 FINDINGS: Cardiovascular: Satisfactory opacification of the pulmonary arteries to the segmental level. No evidence of pulmonary embolism. Cardiomegaly. No pericardial effusion. Atherosclerotic calcifications of the aorta. LEFT-sided coronary artery atherosclerotic calcifications. Mediastinum/Nodes: Revisualization of enlarged mediastinal lymph nodes. Prevascular lymph node measures 17 mm, unchanged (series 5, image  88). There is confluent soft tissue encasing the trachea and bifurcation. This is similar comparison to prior. Confluent subcarinal lymph node measures 23 mm in the short axis (series 5, image 134). Peripherally calcified RIGHT thyroid nodule measures 15 mm. Lungs/Pleura: Small bilateral pleural effusions. Moderate centrilobular emphysema on a background of diffuse ground-glass opacity. RIGHT middle lobe pulmonary nodule measures 6 mm, unchanged (series 5, image 142). Bronchial wall thickening. Bibasilar enhancing consolidative opacities most consistent with atelectasis. Interlobular septal thickening. Upper Abdomen: No acute abnormality. Musculoskeletal: No chest wall abnormality. No acute or significant osseous findings. Review of the MIP images confirms the above findings. IMPRESSION: 1. No evidence of pulmonary embolism. 2. Small bilateral pleural effusions with bibasilar atelectasis. 3. Revisualization of enlarged mediastinal lymph nodes  with confluent soft tissue encasing the trachea and bifurcation, similar in comparison to prior study. This is nonspecific and could be further evaluated with dedicated PET-CT after resolution of acute symptomatology if clinically indicated. 4. Moderate centrilobular emphysema on a background of diffuse ground-glass opacity and bronchial wall thickening likely reflecting superimposed pulmonary edema. Aortic Atherosclerosis (ICD10-I70.0) and Emphysema (ICD10-J43.9). Electronically Signed   By: Meda Klinefelter MD   On: 02/22/2020 18:44      Assessment/Plan  Chest pain  low suspicion of ACS, more MSK related due to coughing Troponin reassuring. EKG with sinus tachycardia and non-specific T wave changes Robaxin as needed   Mild diastolic HF exacerbation Last echo in 12/2019 showed EF of 65-70%, unable to assess diastolic dysfunction.  Takes Lasix 80mg  at home IV Lasix 60mg  here  Monitor Is and Os  Daily weights  COPD exacerbation likely due to increase  pulmonary edema from CHF exac Baseline on 4L start IV Rocephin and Azithromycin Duoneb treatments   HLD continue statin  Morbid obesity BMI of 50  DVT prophylaxis:.Lovenox Code Status: Full Family Communication: Plan discussed with patient at bedside  disposition Plan: Home with at least 2 midnight stays  Consults called:  Admission status: inpatient  Status is: Inpatient  Remains inpatient appropriate because:Inpatient level of care appropriate due to severity of illness   Dispo: The patient is from: Home              Anticipated d/c is to: Home              Anticipated d/c date is: 2 days              Patient currently is not medically stable to d/c.         DO [facility]   If 7PM-7AM, please contact night-coverage [email or web address]   02/23/2020, 12:48 AM

## 2020-02-23 NOTE — ED Notes (Signed)
Pt is A&Ox4 with visitor at bedside, on 4L [place].  O2 sat at 88%, HR 113 (just completed a duoneb), BP 98/69.  Pt states she is always on 4L O2 and her sats stay around 88-91%.  She requested a bedside commode which was provided.  A purewick external catheter was placed. Pt requested something to help her sleep.  No other needs are expressed at this time.

## 2020-02-23 NOTE — TOC Initial Note (Addendum)
Transition of Care [facility]) - Initial/Assessment Note    Patient Details  Name: [name] MRN: [id number] Date of Birth: 02/15/1966  [facility]) CM/SW Contact:    Marina Goodell Phone Number: [phone] 02/23/2020, 2:59 PM  Clinical Narrative:                  Patient presents to [facility] ED due SOB.  Patient stated she recently discharged from [facility] and was on life support. Patient was very tearful and crying about the death of her mother and expressed she did not have the opportunity to grieve for her.  Patient stated she walked with a cane or walker when she is at home. Patient stated her daughter assists her with going to doctor's appointments and grocery shopping.  Patient stated she uses a bed side commode and has been unable take a shower and does not like to use a bathing stool.  Patient stated she takes towel baths and her daughter assists with bathing. Patient stated she requested to change her DNR status back to FULL because "I had given up when I was here last time."  Patient stated she did not want to go to SNF and would only consider home health.  Patient stated she has used [facility] the past and would like to use them when she d/c.  Currently there is no PT order for the patient.  CSW will ask Attending. Attending will order PT consult, plan to d/c tomorrow.  Expected Discharge Plan: Home w Home Health Services Barriers to Discharge: Continued Medical Work up   Patient Goals and CMS Choice Patient states their goals for this hospitalization and ongoing recovery are:: To go back home and get better soon. CMS Medicare.gov Compare Post Acute Care list provided to:: Patient Choice offered to / list presented to : Patient  Expected Discharge Plan and Services Expected Discharge Plan: Home w Home Health Services In-house Referral: Clinical Social Work   Post Acute Care Choice: Home Health Living arrangements for the past 2 months: Single Family  Home                                      Prior Living Arrangements/Services Living arrangements for the past 2 months: Single Family Home Lives with:: Adult Children, Minor Children Patient language and need for interpreter reviewed:: Yes Do you feel safe going back to the place where you live?: Yes      Need for Family Participation in Patient Care: Yes (Comment) Care giver support system in place?: Yes (comment)   Criminal Activity/Legal Involvement Pertinent to Current Situation/Hospitalization: No - Comment as needed  Activities of Daily Living      Permission Sought/Granted Permission sought to share information with : Family Supports    Share Information with NAME: Durward Parcel (Daughter) [phone]  Permission granted to share info w AGENCY: [facility], [facility]        Emotional Assessment Appearance:: Appears older than stated age Attitude/Demeanor/Rapport: Grieving Affect (typically observed): Sad, Overwhelmed, Tearful/Crying Orientation: : Oriented to Self, Oriented to Place, Oriented to  Time, Oriented to Situation Alcohol / Substance Use: Not Applicable Psych Involvement: No (comment)  Admission diagnosis:  Acute CHF (congestive heart failure) (HCC) [I50.9] Patient Active Problem List   Diagnosis Date Noted  . HLD (hyperlipidemia) 02/23/2020  . Morbid obesity with BMI of 50.0-59.9, adult (HCC) 02/23/2020  .  Acute CHF (congestive heart failure) (HCC) 02/22/2020  . COPD exacerbation (HCC)   . Swelling   . Chest pain 09/20/2017  . Pericardial effusion   . Acute on chronic respiratory failure with hypoxia and hypercapnia (HCC) 07/29/2017  . PNA (pneumonia) 06/07/2017   PCP:  Cathlean Marseilles, MD Pharmacy:   [facility] [address] ([address]), [place] - [address] [place]) [place] [address] Phone: [phone] Fax: [phone]     Social Determinants of Health (SDOH) Interventions     Readmission Risk Interventions No flowsheet data found.

## 2020-02-24 LAB — BASIC METABOLIC PANEL
Anion gap: 11 (ref 5–15)
BUN: 12 mg/dL (ref 6–20)
CO2: 30 mmol/L (ref 22–32)
Calcium: 8.6 mg/dL — ABNORMAL LOW (ref 8.9–10.3)
Chloride: 95 mmol/L — ABNORMAL LOW (ref 98–111)
Creatinine, Ser: 0.97 mg/dL (ref 0.44–1.00)
GFR, Estimated: >60 mL/min (ref >60)
Glucose, Bld: 157 mg/dL — ABNORMAL HIGH (ref 70–99)
Potassium: 3.7 mmol/L (ref 3.5–5.1)
Sodium: 136 mmol/L (ref 135–145)

## 2020-02-24 MED ORDER — POTASSIUM CHLORIDE ER 20 MEQ PO TBCR: 40.0000 meq | EXTENDED_RELEASE_TABLET | Freq: Every day | ORAL | 1 refills | Status: AC

## 2020-02-24 MED ORDER — AZITHROMYCIN 500 MG PO TABS: 500.0000 mg | ORAL_TABLET | Freq: Every day | ORAL | 0 refills | Status: AC

## 2020-02-24 NOTE — Progress Notes (Signed)
[facility] Liaison note:  Patient is currently followed by [facility] services at home with a Diagnosis of COPD. She is a Full code.  Visited with patient at bedside. Discussed discharge plans. Patient voiced understanding and agreement with continued hospice services. She is aware that her hospice team is planning on meeting her at home on Thursday to discuss her plan of care related to Physical Therapy and possible transition to Home Health. Patient was very clear about her desire to stay a full code. Writer assured her that she will remain a full code, even with hospice services at discharge. Patient stated that the plan was for discharge home today. Plan confirmed by attending physician Dr. Renford Dills. Hospice care team updated as to planned discharge.  Patient to discharge home via car. Dayna Barker BSN, RN, [facility] liaison [employer] [phone]

## 2020-02-24 NOTE — Evaluation (Addendum)
Physical Therapy Evaluation Patient Details Name: [name] MRN: [id number] DOB: Dec 20, 1965 Today's Date: 02/24/2020   History of Present Illness  Pt is a 54 y/o female with PMH significant for COPD with chronic hypoxemia on 4L, chronic diastolic HF, hx of seizure, HLD, anxiety, HTN, morbid obesity, who presented on 10/10 with worsening chest pain & cough. Pt with recent admission 8/7-8/20 requiring ventilation 2/2 COPD exacerbation & d/c home with hospice care. Current admission CTA revealed B pleural effusion with bibasilar atelectasis & pt found to have CHF exacerbation.   Clinical Impression  Pt is pleasant and eager to d/c home. Pt reports no assist at home but anticipate pt can perform limited mobility (bed<>BSC) mod I with AD upon d/c with recommendation of f/u HHPT to address balance, endurance, and strength deficits.   During session pt was able to complete bed mobility mod I with hospital bed features, transfers with supervision requiring ongoing education re: safe hand placement, and ambulate up to 50 ft with RW & supervision. Vitals noted below, with pt denying any adverse symptoms.  Pt on 4L/min supplemental oxygen via nasal cannula throughout session, with PT educating her on pursed lip breathing for oxygen to recover >or= 90%. Pt reports her normal oxygen saturation = 88-91% at home on 4L/min.  At rest: HR = 104 bpm, SpO2 = 92%, BP = 92/63 mmHg (LUE, sitting) After transferring bed>recliner: HR = 109 bpm, SpO2 = 89-90%, BP = 97/55 mmHg (LUE, sitting) At end of session after ambulating 50 ft: HR = 117 bpm, SpO2 = 88%, BP = 94/69 mmHg (LUE, sitting)    Follow Up Recommendations Home health PT;Supervision - Intermittent    Equipment Recommendations  None recommended by PT (pt has rollator she uses at home, anticipate pt will be able to use AD upon d/c home)    Recommendations for Other Services       Precautions / Restrictions Precautions Precautions:  Fall Restrictions Weight Bearing Restrictions: No      Mobility  Bed Mobility Overal bed mobility: Modified Independent             General bed mobility comments: HOB elevated & bed rails (pt does have hospital bed at home & will either use it or her personal regular bed)  Transfers Overall transfer level: Needs assistance Equipment used: Rolling walker (2 wheeled) Transfers: Sit to/from Stand Sit to Stand: Supervision         General transfer comment: cuing for safe hand placement, not to pull up on RW  Ambulation/Gait Ambulation/Gait assistance: Supervision Gait Distance (Feet): 50 Feet Assistive device: Rolling walker (2 wheeled) Gait Pattern/deviations: Decreased stride length;Decreased step length - right;Decreased step length - left Gait velocity: decreased      Stairs            Wheelchair Mobility    Modified Rankin (Stroke Patients Only)       Balance Overall balance assessment: Mild deficits observed, not formally tested (supervision for mobility with BUE support on RW for increased balance)                                           Pertinent Vitals/Pain Pain Assessment: 0-10 Pain Score: 5  Pain Location: L side of abdomen Pain Descriptors / Indicators: Sore Pain Intervention(s): Limited activity within patient's tolerance;Monitored during session;Other (comment) (educated pt on hugging pillow when coughing to alleviate  pain/soreness)    Home Living Family/patient expects to be discharged to:: Private residence Living Arrangements: Children Available Help at Discharge: Family;Available PRN/intermittently Type of Home: House Home Access: Ramped entrance     Home Layout: One level Home Equipment: Bedside commode (rollator (RW in storage & unaccessible at this time), [facility])      Prior Function           Comments: Pt on 4L/min O2 at home, recently d/c from hospital on hospice care. Stayed in bed most of the day,  short distance ambulation bed<>BSC, didn't eat meals ("I haven't been very hungry") or someone came over & assisted with them.     Hand Dominance   Dominant Hand: Right    Extremity/Trunk Assessment   Upper Extremity Assessment Upper Extremity Assessment: Generalized weakness;Overall WFL for tasks assessed    Lower Extremity Assessment Lower Extremity Assessment: Overall WFL for tasks assessed    Cervical / Trunk Assessment Cervical / Trunk Assessment:  (rounded shoulders)  Communication   Communication: No difficulties  Cognition Arousal/Alertness: Awake/alert Behavior During Therapy: WFL for tasks assessed/performed Overall Cognitive Status: Within Functional Limits for tasks assessed                                 General Comments: pt reports feeling a little "depressed" but talkative throughout session & PT provides encouragement re: current mobility status      General Comments      Exercises     Assessment/Plan    PT Assessment Patient needs continued PT services  PT Problem List Decreased strength;Decreased mobility;Obesity;Decreased activity tolerance;Cardiopulmonary status limiting activity;Decreased balance;Decreased knowledge of use of DME;Pain       PT Treatment Interventions DME instruction;Gait training;Functional mobility training;Patient/family education    PT Goals (Current goals can be found in the Care Plan section)  Acute Rehab PT Goals Patient Stated Goal: to go home PT Goal Formulation: With patient Time For Goal Achievement: 03/09/20 Potential to Achieve Goals: Good    Frequency Min 2X/week   Barriers to discharge Decreased caregiver support pt home alone daily until 2:30-3pm, has family that lives close by that can be at her house within 5 minutes (per pt)    Co-evaluation               AM-PAC PT "6 Clicks" Mobility  Outcome Measure Help needed turning from your back to your side while in a flat bed without  using bedrails?: None Help needed moving from lying on your back to sitting on the side of a flat bed without using bedrails?: None Help needed moving to and from a bed to a chair (including a wheelchair)?: A Little Help needed standing up from a chair using your arms (e.g., wheelchair or bedside chair)?: A Little Help needed to walk in hospital room?: A Little Help needed climbing 3-5 steps with a railing? : A Little 6 Click Score: 20    End of Session Equipment Utilized During Treatment: Gait belt;Oxygen Activity Tolerance: Patient tolerated treatment well Patient left: in chair;with call bell/phone within reach;with chair alarm set Nurse Communication: Mobility status PT Visit Diagnosis: Muscle weakness (generalized) (M62.81);Difficulty in walking, not elsewhere classified (R26.2)    Time: 9735-3299 PT Time Calculation (min) (ACUTE ONLY): 35 min   Charges:   PT Evaluation $PT Eval Moderate Complexity: 1 Mod PT Treatments $Therapeutic Activity: 8-22 mins   Aleda Grana, PT, DPT 02/24/20, 11:22 AM

## 2020-02-24 NOTE — Progress Notes (Signed)
Reinaldo Meeker to be D/C'd Home per MD order. Patient given discharge teaching and paperwork regarding medications, diet, follow-up appointments and activity. Patient understanding verbalized. No questions or complaints at this time. Skin condition as charted. IV and telemetry removed prior to leaving.  No further needs by Care Management/Social Work. Prescriptions e-prescribed.  An After Visit Summary was printed and given to the patient.   Patient escorted via wheelchair. Family bringing portable O2.   [name]

## 2020-02-24 NOTE — Progress Notes (Signed)
Mobility Specialist - Progress Note   02/24/20 1300  Mobility  Activity Ambulated in hall  Level of Assistance Standby assist, set-up cues, supervision of patient - no hands on  Assistive Device Front wheel walker  Distance Ambulated (ft) 180 ft  Mobility Response Tolerated well  Mobility performed by Mobility specialist  $Mobility charge 1 Mobility    Pre-mobility: 108 HR, 95% SpO2 Post-mobility: 111 HR, 93% SpO2    Pt was sitting in recliner upon arrival utilizing 4L [place] O2. Pt agreed to session. Pt was able to stand with no physical assistance. Pt ambulated 180' in hall with RW and no LOB. Pt denied SOB. Overall, pt tolerated session well. Pt was left in recliner with all needs in reach.    Filiberto Pinks Mobility Specialist 02/24/20, 1:38 PM

## 2020-02-24 NOTE — Discharge Summary (Signed)
Physician Discharge Summary  Reinaldo MeekerSonya D Jeansonne EAV:[id number] DOB: 1966/01/18 DOA: 02/22/2020  PCP: Cathlean Marseillesaaleman, Timothy P, MD  Admit date: 02/22/2020 Discharge date: 02/24/2020  Admitted From: Home Disposition:  Home  Discharge Condition:Stable CODE STATUS:FULL Diet recommendation: Heart Healthy   Brief/Interim Summary:  Patient is a 54 year old female with history of COPD on home oxygen at 4 L/min, chronic diastolic congestive heart failure, seizure disorder, GERD, hyperlipidemia, anxiety who presents with complaints of chest pain.  She was also complaining of worsening cough.  Chest pain was worse with deep respiration cough and it were reproducible with palpation of anterior chest.  Patient continues to smoke.  Currently she is on hospice care at home.  On presentations she had low-grade fever , tachycardic, tachypneic but not requiring more oxygen than her baseline.  CT chest did not show any evidence of PE but showed small bilateral pleural effusion and groundglass opacities suggestive of pulmonary edema.  Patient was admitted for the management of exacerbation of CHF/COPD.  Her respiratory status has remained stable.  Is currently she is on 4 L of oxygen which is her baseline.  She is medically stable for discharge home today.  Following problems were addressed during her hospitalization:   Chest pain: Atypical chest pain, associated with cough and deep breathing, reproducible on palpation of anterior chest.  No significant elevation in troponin.  EKG showed nonspecific changes.  Most likely this is musculoskeletal in etiology.    Acute on chronic diastolic congestive heart failure: Presented with increased cough, dyspnea.  Chest imaging showed stable pulmonary edema/congestion.  Echo 8/21 showed ejection fraction of 65 to 70%.  On Lasix 80 mg daily at home.  She was started on  Lasix IV 60 daily.  Her blood pressure is soft so held IV  diuretics and resumed her oral diuretics .  Normal BNP,  but it might be an accurate due to her obesity.  Continue home dose Lasix 40 mg daily at home.  Continue potassium supplementation with that.  Follow-up with cardiology as an outpatient  Acute COPD exacerbation: Presented with increased cough, shortness of breath.  Started on Rocephin and azithromycin on admission.  No evidence of pneumonia as per CT imagings.   Continue bronchodilators as needed.  She is on chronic home oxygen at 4 L/min and she is at her baseline requirement at present.  Sinus tachycardia:  Much improved.  TSH normal.  Hyperlipidemia: Continue statin  Hypokalemia: Supplemented with potassium.  Continue to monitor potassium while on Lasix.  Goals of care: Patient is currently enrolled with hospice at home.  But she is full code.  She will be back to home with hospice care.  Morbid obesity: BMI of 46.  Discharge Diagnoses:  Principal Problem:   Acute CHF (congestive heart failure) (HCC) Active Problems:   Chest pain   COPD exacerbation (HCC)   HLD (hyperlipidemia)   Morbid obesity with BMI of 50.0-59.9, adult [facility](HCC)    Discharge Instructions  Discharge Instructions    Diet - low sodium heart healthy   Complete by: As directed    Discharge instructions   Complete by: As directed    1)Please take prescribed medications as instructed 2)Follow up with your cardiologist as an outpatient.  You have an appointment on October 20 at 12 PM. 3)Follow up with your PCP in 1 to 2 weeks.   Increase activity slowly   Complete by: As directed      Allergies as of 02/24/2020   No Known Allergies  Medication List    TAKE these medications   Advair HFA 115-21 MCG/ACT inhaler Generic drug: fluticasone-salmeterol Inhale 2 puffs into the lungs 2 (two) times daily.   albuterol 108 (90 Base) MCG/ACT inhaler Commonly known as: VENTOLIN HFA Inhale 2 puffs into the lungs every 6 (six) hours as needed for wheezing or shortness of breath.   aspirin EC 81 MG tablet Take  81 mg by mouth daily. Swallow whole.   azithromycin 500 MG tablet Commonly known as: Zithromax Take 1 tablet (500 mg total) by mouth daily for 1 day. Take 1 tablet daily for 3 days.   fluticasone 50 MCG/ACT nasal spray Commonly known as: FLONASE Place 1 spray into both nostrils daily.   furosemide 40 MG tablet Commonly known as: LASIX Take 40 mg by mouth 2 (two) times daily.   ipratropium-albuterol 0.5-2.5 (3) MG/3ML Soln Commonly known as: DUONEB Inhale 3 mLs into the lungs every 6 (six) hours as needed.   omeprazole 20 MG capsule Commonly known as: PRILOSEC Take 20 mg by mouth daily.   Potassium Chloride ER 20 MEQ Tbcr Take 40 mEq by mouth daily.   rosuvastatin 20 MG tablet Commonly known as: CRESTOR Take 20 mg by mouth daily.   Spiriva HandiHaler 18 MCG inhalation capsule Generic drug: tiotropium Place 1 capsule into inhaler and inhale daily.       Follow-up Information    [facility] Follow up on 03/03/2020.   Specialty: Cardiology Why: at 12:00pm. Enter through the [facility] entrance Contact information: [address] [place] [address] [phone]       Cathlean Marseilles, MD. Schedule an appointment as soon as possible for a visit in 1 week(s).   Specialty: Family Medicine Contact information: [id number] [facility] [place] [address] [phone]              No Known Allergies  Consultations:  None   Procedures/Studies: DG Chest 2 View  Result Date: 02/22/2020 CLINICAL DATA:  Patient with chest wall pain and tenderness. EXAM: CHEST - 2 VIEW COMPARISON:  Chest radiograph 12/29/2019. FINDINGS: Interval extubation and removal of enteric tube. Stable cardiomegaly. Interval decrease in bilateral interstitial opacities. Persistent small bilateral pleural effusions. Thoracic spine degenerative changes. IMPRESSION: Cardiomegaly with  improving interstitial opacities. Electronically Signed   By: Annia Belt M.D.   On: 02/22/2020 13:44   CT Angio Chest PE W and/or Wo Contrast  Result Date: 02/22/2020 CLINICAL DATA:  Chest wall pain EXAM: CT ANGIOGRAPHY CHEST WITH CONTRAST TECHNIQUE: Multidetector CT imaging of the chest was performed using the standard protocol during bolus administration of intravenous contrast. Multiplanar CT image reconstructions and MIPs were obtained to evaluate the vascular anatomy. CONTRAST:  52mL OMNIPAQUE IOHEXOL 350 MG/ML SOLN COMPARISON:  July 31, 2017 FINDINGS: Cardiovascular: Satisfactory opacification of the pulmonary arteries to the segmental level. No evidence of pulmonary embolism. Cardiomegaly. No pericardial effusion. Atherosclerotic calcifications of the aorta. LEFT-sided coronary artery atherosclerotic calcifications. Mediastinum/Nodes: Revisualization of enlarged mediastinal lymph nodes. Prevascular lymph node measures 17 mm, unchanged (series 5, image 88). There is confluent soft tissue encasing the trachea and bifurcation. This is similar comparison to prior. Confluent subcarinal lymph node measures 23 mm in the short axis (series 5, image 134). Peripherally calcified RIGHT thyroid nodule measures 15 mm. Lungs/Pleura: Small bilateral pleural effusions. Moderate centrilobular emphysema on a background of diffuse ground-glass opacity. RIGHT middle lobe pulmonary nodule measures 6 mm, unchanged (  series 5, image 142). Bronchial wall thickening. Bibasilar enhancing consolidative opacities most consistent with atelectasis. Interlobular septal thickening. Upper Abdomen: No acute abnormality. Musculoskeletal: No chest wall abnormality. No acute or significant osseous findings. Review of the MIP images confirms the above findings. IMPRESSION: 1. No evidence of pulmonary embolism. 2. Small bilateral pleural effusions with bibasilar atelectasis. 3. Revisualization of enlarged mediastinal lymph nodes with  confluent soft tissue encasing the trachea and bifurcation, similar in comparison to prior study. This is nonspecific and could be further evaluated with dedicated PET-CT after resolution of acute symptomatology if clinically indicated. 4. Moderate centrilobular emphysema on a background of diffuse ground-glass opacity and bronchial wall thickening likely reflecting superimposed pulmonary edema. Aortic Atherosclerosis (ICD10-I70.0) and Emphysema (ICD10-J43.9). Electronically Signed   By: Meda Klinefelter MD   On: 02/22/2020 18:44       Subjective: Patient seen and examined the bedside this morning.  Medically stable for discharge today.  Discharge Exam: Vitals:   02/24/20 0733 02/24/20 1044  BP:  (!) 89/61  Pulse:    Resp:    Temp:    SpO2: 90%    Vitals:   02/24/20 0554 02/24/20 0730 02/24/20 0733 02/24/20 1044  BP: 90/61 (!) 90/59  (!) 89/61  Pulse:  97    Resp:  18    Temp:  98.7 F (37.1 C)    TempSrc:  Oral    SpO2:  92% 90%   Weight:      Height:        General: Pt is alert, awake, not in acute distress Cardiovascular: RRR, S1/S2 +, no rubs, no gallops Respiratory: CTA bilaterally, no wheezing, no rhonchi Abdominal: Soft, NT, ND, bowel sounds + Extremities: no edema, no cyanosis    The results of significant diagnostics from this hospitalization (including imaging, microbiology, ancillary and laboratory) are listed below for reference.     Microbiology: Recent Results (from the past 240 hour(s))  Blood culture (single)     Status: None (Preliminary result)   Collection Time: 02/22/20 12:35 PM   Specimen: BLOOD  Result Value Ref Range Status   Specimen Description BLOOD RIGHT HAND  Final   Special Requests   Final    BOTTLES DRAWN AEROBIC AND ANAEROBIC Blood Culture results may not be optimal due to an excessive volume of blood received in culture bottles   Culture   Final    NO GROWTH 2 DAYS Performed at [facility], [address]  [place], [place] [address]    Report Status PENDING  Incomplete  Respiratory Panel by RT PCR (Flu A&B, Covid) - Nasopharyngeal Swab     Status: None   Collection Time: 02/22/20  8:05 PM   Specimen: Nasopharyngeal Swab  Result Value Ref Range Status   SARS Coronavirus 2 by RT PCR NEGATIVE NEGATIVE Final    Comment: (NOTE) SARS-CoV-2 target nucleic acids are NOT DETECTED.  The SARS-CoV-2 RNA is generally detectable in upper respiratoy specimens during the acute phase of infection. The lowest concentration of SARS-CoV-2 viral copies this assay can detect is 131 copies/mL. A negative result does not preclude SARS-Cov-2 infection and should not be used as the sole basis for treatment or other patient management decisions. A negative result may occur with  improper specimen collection/handling, submission of specimen other than nasopharyngeal swab, presence of viral mutation(s) within the areas targeted by this assay, and inadequate number of viral copies (<131 copies/mL). A negative result must be combined with clinical observations, patient history, and epidemiological  information. The expected result is Negative.  Fact Sheet for Patients:  https://www.moore.com/  Fact Sheet for Healthcare Providers:  https://www.young.biz/  This test is no t yet approved or cleared by the [place] FDA and  has been authorized for detection and/or diagnosis of SARS-CoV-2 by FDA under an Emergency Use Authorization (EUA). This EUA will remain  in effect (meaning this test can be used) for the duration of the COVID-19 declaration under Section 564(b)(1) of the Act, 21 U.S.C. section 360bbb-3(b)(1), unless the authorization is terminated or revoked sooner.     Influenza A by PCR NEGATIVE NEGATIVE Final   Influenza B by PCR NEGATIVE NEGATIVE Final    Comment: (NOTE) The Xpert Xpress SARS-CoV-2/FLU/RSV assay is intended as an aid in  the diagnosis of influenza  from Nasopharyngeal swab specimens and  should not be used as a sole basis for treatment. Nasal washings and  aspirates are unacceptable for Xpert Xpress SARS-CoV-2/FLU/RSV  testing.  Fact Sheet for Patients: https://www.moore.com/  Fact Sheet for Healthcare Providers: https://www.young.biz/  This test is not yet approved or cleared by the [place] FDA and  has been authorized for detection and/or diagnosis of SARS-CoV-2 by  FDA under an Emergency Use Authorization (EUA). This EUA will remain  in effect (meaning this test can be used) for the duration of the  Covid-19 declaration under Section 564(b)(1) of the Act, 21  U.S.C. section 360bbb-3(b)(1), unless the authorization is  terminated or revoked. Performed at [facility], [address] [place], [place] [address]      Labs: BNP (last 3 results) Recent Labs    12/20/19 2154 02/23/20 0707  BNP 307.1* 80.4   Basic Metabolic Panel: Recent Labs  Lab 02/22/20 1235 02/23/20 0707 02/24/20 0443  NA 132* 135 136  K 3.7 3.4* 3.7  CL 91* 95* 95*  CO2 27 29 30   GLUCOSE 125* 120* 157*  BUN 10 11 12   CREATININE 0.79 0.92 0.97  CALCIUM 8.9 8.5* 8.6*   Liver Function Tests: No results for input(s): AST, ALT, ALKPHOS, BILITOT, PROT, ALBUMIN in the last 168 hours. No results for input(s): LIPASE, AMYLASE in the last 168 hours. No results for input(s): AMMONIA in the last 168 hours. CBC: Recent Labs  Lab 02/22/20 1235 02/23/20 0707  WBC 10.6* 9.9  HGB 16.5* 15.0  HCT 52.4* 47.0*  MCV 83.6 84.5  PLT 228 230   Cardiac Enzymes: No results for input(s): CKTOTAL, CKMB, CKMBINDEX, TROPONINI in the last 168 hours. BNP: Invalid input(s): POCBNP CBG: No results for input(s): GLUCAP in the last 168 hours. D-Dimer No results for input(s): DDIMER in the last 72 hours. Hgb A1c No results for input(s): HGBA1C in the last 72 hours. Lipid Profile No results for input(s):  CHOL, HDL, LDLCALC, TRIG, CHOLHDL, LDLDIRECT in the last 72 hours. Thyroid function studies Recent Labs    02/23/20 0707  TSH 2.030   Anemia work up No results for input(s): VITAMINB12, FOLATE, FERRITIN, TIBC, IRON, RETICCTPCT in the last 72 hours. Urinalysis    Component Value Date/Time   COLORURINE Yellow 03/26/2014 1245   APPEARANCEUR Clear 03/26/2014 1245   LABSPEC 1.011 03/26/2014 1245   PHURINE 6.0 03/26/2014 1245   GLUCOSEU Negative 03/26/2014 1245   HGBUR Negative 03/26/2014 1245   BILIRUBINUR Negative 03/26/2014 1245   KETONESUR Negative 03/26/2014 1245   PROTEINUR Negative 03/26/2014 1245   NITRITE Negative 03/26/2014 1245   LEUKOCYTESUR Negative 03/26/2014 1245   Sepsis Labs Invalid input(s): PROCALCITONIN,  WBC,  LACTICIDVEN Microbiology Recent Results (from the past 240 hour(s))  Blood culture (single)     Status: None (Preliminary result)   Collection Time: 02/22/20 12:35 PM   Specimen: BLOOD  Result Value Ref Range Status   Specimen Description BLOOD RIGHT HAND  Final   Special Requests   Final    BOTTLES DRAWN AEROBIC AND ANAEROBIC Blood Culture results may not be optimal due to an excessive volume of blood received in culture bottles   Culture   Final    NO GROWTH 2 DAYS Performed at [facility], [address] [place], [place] [address]    Report Status PENDING  Incomplete  Respiratory Panel by RT PCR (Flu A&B, Covid) - Nasopharyngeal Swab     Status: None   Collection Time: 02/22/20  8:05 PM   Specimen: Nasopharyngeal Swab  Result Value Ref Range Status   SARS Coronavirus 2 by RT PCR NEGATIVE NEGATIVE Final    Comment: (NOTE) SARS-CoV-2 target nucleic acids are NOT DETECTED.  The SARS-CoV-2 RNA is generally detectable in upper respiratoy specimens during the acute phase of infection. The lowest concentration of SARS-CoV-2 viral copies this assay can detect is 131 copies/mL. A negative result does not preclude SARS-Cov-2 infection and  should not be used as the sole basis for treatment or other patient management decisions. A negative result may occur with  improper specimen collection/handling, submission of specimen other than nasopharyngeal swab, presence of viral mutation(s) within the areas targeted by this assay, and inadequate number of viral copies (<131 copies/mL). A negative result must be combined with clinical observations, patient history, and epidemiological information. The expected result is Negative.  Fact Sheet for Patients:  https://www.moore.com/  Fact Sheet for Healthcare Providers:  https://www.young.biz/  This test is no t yet approved or cleared by the [place] FDA and  has been authorized for detection and/or diagnosis of SARS-CoV-2 by FDA under an Emergency Use Authorization (EUA). This EUA will remain  in effect (meaning this test can be used) for the duration of the COVID-19 declaration under Section 564(b)(1) of the Act, 21 U.S.C. section 360bbb-3(b)(1), unless the authorization is terminated or revoked sooner.     Influenza A by PCR NEGATIVE NEGATIVE Final   Influenza B by PCR NEGATIVE NEGATIVE Final    Comment: (NOTE) The Xpert Xpress SARS-CoV-2/FLU/RSV assay is intended as an aid in  the diagnosis of influenza from Nasopharyngeal swab specimens and  should not be used as a sole basis for treatment. Nasal washings and  aspirates are unacceptable for Xpert Xpress SARS-CoV-2/FLU/RSV  testing.  Fact Sheet for Patients: https://www.moore.com/  Fact Sheet for Healthcare Providers: https://www.young.biz/  This test is not yet approved or cleared by the [place] FDA and  has been authorized for detection and/or diagnosis of SARS-CoV-2 by  FDA under an Emergency Use Authorization (EUA). This EUA will remain  in effect (meaning this test can be used) for the duration of the  Covid-19 declaration  under Section 564(b)(1) of the Act, 21  U.S.C. section 360bbb-3(b)(1), unless the authorization is  terminated or revoked. Performed at [facility], [address], [place], [place] [address]     Please note: You were cared for by a hospitalist during your hospital stay. Once you are discharged, your primary care physician will handle any further medical issues. Please note that NO REFILLS for any discharge medications will be authorized once you are discharged, as it is imperative that you return to your primary care physician (  or establish a relationship with a primary care physician if you do not have one) for your post hospital discharge needs so that they can reassess your need for medications and monitor your lab values.    Time coordinating discharge: 40 minutes  SIGNED:   Burnadette Pop, MD  [facility] 02/24/2020, 12:12 PM Pager [phone]  If 7PM-7AM, please contact night-coverage [email or web address] Password [email or web address]

## 2020-02-27 LAB — CULTURE, BLOOD (SINGLE): Culture: NO GROWTH

## 2020-03-03 ENCOUNTER — Other Ambulatory Visit: Payer: Self-pay

## 2020-03-03 ENCOUNTER — Encounter: Payer: Self-pay | Admitting: Family

## 2020-03-03 ENCOUNTER — Ambulatory Visit: Payer: Medicaid Other | Attending: Family | Admitting: Family

## 2020-03-03 VITALS — BP 129/78 | HR 106 | Resp 18 | Ht 60.0 in | Wt 232.0 lb

## 2020-03-03 DIAGNOSIS — I5032 Chronic diastolic (congestive) heart failure: Secondary | ICD-10-CM | POA: Diagnosis not present

## 2020-03-03 DIAGNOSIS — I110 Hypertensive heart disease with heart failure: Secondary | ICD-10-CM | POA: Insufficient documentation

## 2020-03-03 DIAGNOSIS — F17210 Nicotine dependence, cigarettes, uncomplicated: Secondary | ICD-10-CM | POA: Insufficient documentation

## 2020-03-03 DIAGNOSIS — R42 Dizziness and giddiness: Secondary | ICD-10-CM | POA: Insufficient documentation

## 2020-03-03 DIAGNOSIS — Z79899 Other long term (current) drug therapy: Secondary | ICD-10-CM | POA: Insufficient documentation

## 2020-03-03 DIAGNOSIS — Z8249 Family history of ischemic heart disease and other diseases of the circulatory system: Secondary | ICD-10-CM | POA: Insufficient documentation

## 2020-03-03 DIAGNOSIS — J449 Chronic obstructive pulmonary disease, unspecified: Secondary | ICD-10-CM | POA: Insufficient documentation

## 2020-03-03 DIAGNOSIS — Z7982 Long term (current) use of aspirin: Secondary | ICD-10-CM | POA: Diagnosis not present

## 2020-03-03 DIAGNOSIS — R7303 Prediabetes: Secondary | ICD-10-CM | POA: Insufficient documentation

## 2020-03-03 DIAGNOSIS — K5900 Constipation, unspecified: Secondary | ICD-10-CM | POA: Diagnosis not present

## 2020-03-03 DIAGNOSIS — R0602 Shortness of breath: Secondary | ICD-10-CM | POA: Insufficient documentation

## 2020-03-03 NOTE — Progress Notes (Signed)
Subjective:    Patient ID: [name], female    DOB: [birth year], 54 y.o.   MRN: [id number]  Congestive Heart Failure Associated symptoms include fatigue and shortness of breath (With moderate exertion). Pertinent negatives include no abdominal pain, chest pain or palpitations.   Patient recently admitted on 02/22/20 for chest pain and COPD. Patient reports she has had multiple ER visits and has been intubated multiple times with difficult airway complications.  Patient had an Echo 12/21/19 that showed EF 65-70%, which is improved from TEE on 09/20/17 that showed EF of 60%.  Patient presents today with a chief complaint of shortness of breath with minimal exertion. Patient denies any chest pain or other pain complaints. Patient presents for initial visit after being hospitalized   Patient reports she smokes 1/2 PPD and has been smoking for 44 years. Patient reports she is the caregiver for her 4 grandchildren at home and her 86 year old grandchild is often ill due to a birth defect. Patient reports her daughter also lives with her and her son checks on her frequently. Patient reports her boyfriend is on dialysis x3 days per week.   Past Medical History:  Diagnosis Date  . Anxiety   . CHF (congestive heart failure) (HCC)   . COPD (chronic obstructive pulmonary disease) (HCC)   . Essential hypertension   . History of echocardiogram    a. 08/2013 Echo: EF 55-60%, impaired LV relaxation, mild LVH, nl RV fxn, nl RVSP, mild TR; b. 05/2017 Echo: EF 65-70%, no rwma, nl RV fxn.  . Morbid obesity (HCC)   . Tobacco abuse    Past Surgical History:  Procedure Laterality Date  . ABDOMINAL HYSTERECTOMY    . CHOLECYSTECTOMY    . EXTUBATION (ENDOTRACHEAL) IN OR N/A 08/14/2017   Procedure: EXTUBATION (ENDOTRACHEAL) IN OR;  Surgeon: Geanie Logan, MD;  Location: [facility] ORS;  Service: ENT;  Laterality: N/A;  . HERNIA REPAIR    . TEE WITHOUT CARDIOVERSION N/A 09/20/2017   Procedure: TRANSESOPHAGEAL  ECHOCARDIOGRAM (TEE);  Surgeon: Laurier Nancy, MD;  Location: [facility] ORS;  Service: Cardiovascular;  Laterality: N/A;   Family History  Problem Relation Age of Onset  . Dementia Mother   . Heart attack Father    Social History   Tobacco Use  . Smoking status: Current Every Day Smoker    Packs/day: 0.50    Years: 40.00    Pack years: 20.00    Types: Cigarettes  . Smokeless tobacco: Never Used  Substance Use Topics  . Alcohol use: No   No Known Allergies Prior to Admission medications   Medication Sig Start Date End Date Taking? Authorizing Provider  ADVAIR HFA 115-21 MCG/ACT inhaler Inhale 2 puffs into the lungs 2 (two) times daily. 11/21/19  Yes [provider]  aspirin EC 81 MG tablet Take 81 mg by mouth daily. Swallow whole.   Yes [provider]  fluticasone (FLONASE) 50 MCG/ACT nasal spray Place 1 spray into both nostrils daily. 02/21/20  Yes [provider]  furosemide (LASIX) 40 MG tablet Take 40 mg by mouth 2 (two) times daily. 11/21/19  Yes [provider]  ipratropium-albuterol (DUONEB) 0.5-2.5 (3) MG/3ML SOLN Inhale 3 mLs into the lungs every 6 (six) hours as needed. 01/02/20  Yes Lurene Shadow, MD  potassium chloride 20 MEQ TBCR Take 40 mEq by mouth daily. 02/24/20  Yes Burnadette Pop, MD  rosuvastatin (CRESTOR) 20 MG tablet Take 20 mg by mouth daily.   Yes [provider]  SPIRIVA HANDIHALER 18 MCG inhalation capsule Place 1 capsule into inhaler and inhale daily. 11/21/19  Yes [provider]  albuterol (PROVENTIL HFA;VENTOLIN HFA) 108 (90 Base) MCG/ACT inhaler Inhale 2 puffs into the lungs every 6 (six) hours as needed for wheezing or shortness of breath. Patient not taking: Reported on 03/03/2020 06/12/17   Ramonita Lab, MD  omeprazole (PRILOSEC) 20 MG capsule Take 20 mg by mouth daily. Patient not taking: Reported on 03/03/2020    [provider]    Review of Systems  Constitutional: Positive for fatigue.  Negative for appetite change.  HENT: Negative for congestion, postnasal drip and sore throat.   Eyes: Negative.   Respiratory: Positive for cough (COPD) and shortness of breath (With moderate exertion).   Cardiovascular: Negative for chest pain, palpitations and leg swelling.  Gastrointestinal: Positive for constipation (Chronic, attempts to use laxatives). Negative for abdominal distention and abdominal pain.  Endocrine: Negative.   Genitourinary: Negative.   Musculoskeletal: Negative for back pain and neck pain.  Skin: Negative.   Neurological: Positive for dizziness (Positional change). Negative for syncope, weakness and light-headedness.  Hematological: Negative for adenopathy. Does not bruise/bleed easily.  Psychiatric/Behavioral: Negative for dysphoric mood and sleep disturbance. The patient is not nervous/anxious.         Objective:   Physical Exam Constitutional:      General: She is not in acute distress.    Appearance: She is obese. She is not ill-appearing or toxic-appearing.  Cardiovascular:     Heart sounds: Normal heart sounds.  Pulmonary:     Effort: Pulmonary effort is normal.     Breath sounds: Wheezing present.  Abdominal:     General: There is no distension.     Palpations: Abdomen is soft.     Tenderness: There is no abdominal tenderness.  Skin:    General: Skin is warm and dry.  Neurological:     Mental Status: She is alert.  Psychiatric:        Mood and Affect: Mood normal.        Behavior: Behavior normal.        Thought Content: Thought content normal.        Judgment: Judgment normal.     Vitals with BMI 03/03/2020 02/24/2020 02/24/2020  Height 5\' 0"  - -  Weight 232 lbs - -  BMI 45.31 - -  Systolic 129 102 89  Diastolic 78 61 61  Pulse 106 96 -     Lab Results  Component Value Date   CREATININE 0.97 02/24/2020   CREATININE 0.92 02/23/2020   CREATININE 0.79 02/22/2020         Assessment & Plan:   1: Chronic heart failure with  preserved ejection fraction with structural changes (mild LVH)- - NYHA class III - euvolemic today -Patient reports she is wearing compression socks at times -Encouraged to watch salt intake, try to keep it down to 2000mg  per day of sodium.  -Encouraged to weigh every day, given scale in office as well as weight chart -Patient told if gaining more than 2lbs overnight or 5lbs in a week to call office -Encouraged to drink only 40-60oz of fluid per day -Low sodium cookbook given in office -BNP was 80.4 on 02/23/20  2: COPD:  -Wearing 4L O2 at baseline -Reports she was recently d/c from hospice -Wheezing noted to left lung -Patient reports she smokes 1/2 PPD and has been smoking for 44 years  3: Pre-Diabetes: - A1c on 12/21/19 was  6.9 -Not currently on medication for diabetes -BMP on 02/24/20 showed Sodium 136, Potassium 3.7, Creatinine 0.97, BUN 12 -Patient reports she stopped drinking soda -Saw PCP 11/21/19   Patient to follow up in 2 months or earlier if needed.

## 2020-03-03 NOTE — Patient Instructions (Signed)
Hemoglobin A1c Test Why am I having this test? You may have the hemoglobin A1c test (HbA1c test) done to:  Evaluate your risk for developing diabetes (diabetes mellitus).  Diagnose diabetes.  Monitor long-term control of blood sugar (glucose) in people who have diabetes and help make treatment decisions. This test may be done with other blood glucose tests, such as fasting blood glucose and oral glucose tolerance tests. What is being tested? Hemoglobin is a type of protein in the blood that carries oxygen. Glucose attaches to hemoglobin to form glycated hemoglobin. This test checks the amount of glycated hemoglobin in your blood, which is a good indicator of the average amount of glucose in your blood during the past 2-3 months. What kind of sample is taken?  A blood sample is required for this test. It is usually collected by inserting a needle into a blood vessel. Tell a health care provider about:  All medicines you are taking, including vitamins, herbs, eye drops, creams, and over-the-counter medicines.  Any blood disorders you have.  Any surgeries you have had.  Any medical conditions you have.  Whether you are pregnant or may be pregnant. How are the results reported? Your results will be reported as a percentage that indicates how much of your hemoglobin has glucose attached to it (is glycated). Your health care provider will compare your results to normal ranges that were established after testing a large group of people (reference ranges). Reference ranges may vary among labs and hospitals. For this test, common reference ranges are:  Adult or child without diabetes: 4-5.6%.  Adult or child with diabetes and good blood glucose control: less than 7%. What do the results mean? If you have diabetes:  A result of less than 7% is considered normal, meaning that your blood glucose is well controlled.  A result higher than 7% means that your blood glucose is not well  controlled, and your treatment plan may need to be adjusted. If you do not have diabetes:  A result within the reference range is considered normal, meaning that you are not at high risk for diabetes.  A result of 5.7-6.4% means that you have a high risk of developing diabetes, and you may have prediabetes. Prediabetes is the condition of having a blood glucose level that is higher than it should be, but not high enough for you to be diagnosed with diabetes. Having prediabetes puts you at risk for developing type 2 diabetes (type 2 diabetes mellitus). You may have more tests, including a repeat HbA1c test.  Results of 6.5% or higher on two separate HbA1c tests mean that you have diabetes. You may have more tests to confirm the diagnosis. Abnormally low HbA1c values may be caused by:  Pregnancy.  Severe blood loss.  Receiving donated blood (transfusions).  Low red blood cell count (anemia).  Long-term kidney failure.  Some unusual forms (variants) of hemoglobin. Talk with your health care provider about what your results mean. Questions to ask your health care provider Ask your health care provider, or the department that is doing the test:  When will my results be ready?  How will I get my results?  What are my treatment options?  What other tests do I need?  What are my next steps? Summary  The hemoglobin A1c test (HbA1c test) may be done to evaluate your risk for developing diabetes, to diagnose diabetes, and to monitor long-term control of blood sugar (glucose) in people who have diabetes and help make  treatment decisions.  Hemoglobin is a type of protein in the blood that carries oxygen. Glucose attaches to hemoglobin to form glycated hemoglobin. This test checks the amount of glycated hemoglobin in your blood, which is a good indicator of the average amount of glucose in your blood during the past 2-3 months.  Talk with your health care provider about what your results  mean. This information is not intended to replace advice given to you by your health care provider. Make sure you discuss any questions you have with your health care provider. Document Revised: 04/13/2017 Document Reviewed: 12/12/2016 Elsevier Patient Education  2020 [employer].   Diabetes Mellitus and Nutrition, Adult When you have diabetes (diabetes mellitus), it is very important to have healthy eating habits because your blood sugar (glucose) levels are greatly affected by what you eat and drink. Eating healthy foods in the appropriate amounts, at about the same times every day, can help you:  Control your blood glucose.  Lower your risk of heart disease.  Improve your blood pressure.  Reach or maintain a healthy weight. Every person with diabetes is different, and each person has different needs for a meal plan. Your health care provider may recommend that you work with a diet and nutrition specialist (dietitian) to make a meal plan that is best for you. Your meal plan may vary depending on factors such as:  The calories you need.  The medicines you take.  Your weight.  Your blood glucose, blood pressure, and cholesterol levels.  Your activity level.  Other health conditions you have, such as heart or kidney disease. How do carbohydrates affect me? Carbohydrates, also called carbs, affect your blood glucose level more than any other type of food. Eating carbs naturally raises the amount of glucose in your blood. Carb counting is a method for keeping track of how many carbs you eat. Counting carbs is important to keep your blood glucose at a healthy level, especially if you use insulin or take certain oral diabetes medicines. It is important to know how many carbs you can safely have in each meal. This is different for every person. Your dietitian can help you calculate how many carbs you should have at each meal and for each snack. Foods that contain carbs include:  Bread,  cereal, rice, pasta, and crackers.  Potatoes and corn.  Peas, beans, and lentils.  Milk and yogurt.  Fruit and juice.  Desserts, such as cakes, cookies, ice cream, and candy. How does alcohol affect me? Alcohol can cause a sudden decrease in blood glucose (hypoglycemia), especially if you use insulin or take certain oral diabetes medicines. Hypoglycemia can be a life-threatening condition. Symptoms of hypoglycemia (sleepiness, dizziness, and confusion) are similar to symptoms of having too much alcohol. If your health care provider says that alcohol is safe for you, follow these guidelines:  Limit alcohol intake to no more than 1 drink per day for nonpregnant women and 2 drinks per day for men. One drink equals 12 oz of beer, 5 oz of wine, or 1 oz of hard liquor.  Do not drink on an empty stomach.  Keep yourself hydrated with water, diet soda, or unsweetened iced tea.  Keep in mind that regular soda, juice, and other mixers may contain a lot of sugar and must be counted as carbs. What are tips for following this plan?  Reading food labels  Start by checking the serving size on the "Nutrition Facts" label of packaged foods and drinks. The  amount of calories, carbs, fats, and other nutrients listed on the label is based on one serving of the item. Many items contain more than one serving per package.  Check the total grams (g) of carbs in one serving. You can calculate the number of servings of carbs in one serving by dividing the total carbs by 15. For example, if a food has 30 g of total carbs, it would be equal to 2 servings of carbs.  Check the number of grams (g) of saturated and trans fats in one serving. Choose foods that have low or no amount of these fats.  Check the number of milligrams (mg) of salt (sodium) in one serving. Most people should limit total sodium intake to less than 2,300 mg per day.  Always check the nutrition information of foods labeled as "low-fat" or  "nonfat". These foods may be higher in added sugar or refined carbs and should be avoided.  Talk to your dietitian to identify your daily goals for nutrients listed on the label. Shopping  Avoid buying canned, premade, or processed foods. These foods tend to be high in fat, sodium, and added sugar.  Shop around the outside edge of the grocery store. This includes fresh fruits and vegetables, bulk grains, fresh meats, and fresh dairy. Cooking  Use low-heat cooking methods, such as baking, instead of high-heat cooking methods like deep frying.  Cook using healthy oils, such as olive, canola, or sunflower oil.  Avoid cooking with butter, cream, or high-fat meats. Meal planning  Eat meals and snacks regularly, preferably at the same times every day. Avoid going long periods of time without eating.  Eat foods high in fiber, such as fresh fruits, vegetables, beans, and whole grains. Talk to your dietitian about how many servings of carbs you can eat at each meal.  Eat 4-6 ounces (oz) of lean protein each day, such as lean meat, chicken, fish, eggs, or tofu. One oz of lean protein is equal to: ? 1 oz of meat, chicken, or fish. ? 1 egg. ?  cup of tofu.  Eat some foods each day that contain healthy fats, such as avocado, nuts, seeds, and fish. Lifestyle  Check your blood glucose regularly.  Exercise regularly as told by your health care provider. This may include: ? 150 minutes of moderate-intensity or vigorous-intensity exercise each week. This could be brisk walking, biking, or water aerobics. ? Stretching and doing strength exercises, such as yoga or weightlifting, at least 2 times a week.  Take medicines as told by your health care provider.  Do not use any products that contain nicotine or tobacco, such as cigarettes and e-cigarettes. If you need help quitting, ask your health care provider.  Work with a Veterinary surgeoncounselor or diabetes educator to identify strategies to manage stress and  any emotional and social challenges. Questions to ask a health care provider  Do I need to meet with a diabetes educator?  Do I need to meet with a dietitian?  What number can I call if I have questions?  When are the best times to check my blood glucose? Where to find more information:  American Diabetes Association: diabetes.org  Academy of Nutrition and Dietetics: www.eatright.[employer]org  National Institute of Diabetes and Digestive and Kidney Diseases (NIH): [employer].tnwww.niddk.nih.gov Summary  A healthy meal plan will help you control your blood glucose and maintain a healthy lifestyle.  Working with a diet and nutrition specialist (dietitian) can help you make a meal plan that is best for  you.  Keep in mind that carbohydrates (carbs) and alcohol have immediate effects on your blood glucose levels. It is important to count carbs and to use alcohol carefully. This information is not intended to replace advice given to you by your health care provider. Make sure you discuss any questions you have with your health care provider. Document Revised: 04/13/2017 Document Reviewed: 06/05/2016 Elsevier Patient Education  2020 Elsevier Inc.   Heart Failure, Diagnosis  Heart failure means that your heart is not able to pump blood in the right way. This makes it hard for your body to work well. Heart failure is usually a long-term (chronic) condition. You must take good care of yourself and follow your treatment plan from your doctor. What are the causes? This condition may be caused by:  High blood pressure.  Build up of cholesterol and fat in the arteries.  Heart attack. This injures the heart muscle.  Heart valves that do not open and close properly.  Damage of the heart muscle. This is also called cardiomyopathy.  Lung disease.  Abnormal heart rhythms. What increases the risk? The risk of heart failure goes up as a person ages. This condition is also more likely to develop in people  who:  Are overweight.  Are female.  Smoke or chew tobacco.  Abuse alcohol or illegal drugs.  Have taken medicines that can damage the heart.  Have diabetes.  Have abnormal heart rhythms.  Have thyroid problems.  Have low blood counts (anemia). What are the signs or symptoms? Symptoms of this condition include:  Shortness of breath.  Coughing.  Swelling of the feet, ankles, legs, or belly.  Losing weight for no reason.  Trouble breathing.  Waking from sleep because of the need to sit up and get more air.  Rapid heartbeat.  Being very tired.  Feeling dizzy, or feeling like you may pass out (faint).  Having no desire to eat.  Feeling like you may vomit (nauseous).  Peeing (urinating) more at night.  Feeling confused. How is this treated?     This condition may be treated with:  Medicines. These can be given to treat blood pressure and to make the heart muscles stronger.  Changes in your daily life. These may include eating a healthy diet, staying at a healthy body weight, quitting tobacco and illegal drug use, or doing exercises.  Surgery. Surgery can be done to open blocked valves, or to put devices in the heart, such as pacemakers.  A donor heart (heart transplant). You will receive a healthy heart from a donor. Follow these instructions at home:  Treat other conditions as told by your doctor. These may include high blood pressure, diabetes, thyroid disease, or abnormal heart rhythms.  Learn as much as you can about heart failure.  Get support as you need it.  Keep all follow-up visits as told by your doctor. This is important. Summary  Heart failure means that your heart is not able to pump blood in the right way.  This condition is caused by high blood pressure, heart attack, or damage of the heart muscle.  Symptoms of this condition include shortness of breath and swelling of the feet, ankles, legs, or belly. You may also feel very tired or  feel like you may vomit.  You may be treated with medicines, surgery, or changes in your daily life.  Treat other health conditions as told by your doctor. This information is not intended to replace advice given to you by  your health care provider. Make sure you discuss any questions you have with your health care provider. Document Revised: 07/19/2018 Document Reviewed: 07/19/2018 Elsevier Patient Education  2020 [employer].

## 2020-03-08 ENCOUNTER — Telehealth: Payer: Self-pay | Admitting: Nurse Practitioner

## 2020-03-08 NOTE — Telephone Encounter (Signed)
Spoke with patient about the Palliative referral and she has declined Palliative services at this time, she doesn't feel that she needs it.  She said that she would speak with Dr. Bari Edward at her next appointment about Palliative services in a month.  Will notify MD and Palliative Team.

## 2020-05-02 IMAGING — CR DG CHEST 2V
1 series · 2 of 2 positions shown · non-contrast
Comparison: 03/16/2018

CLINICAL DATA: Chest pain.

EXAM:
CHEST - 2 VIEW

[Series 1: w chest pa · 0.14mm/px · 2 of 2 slices shown]
[im 1/2]
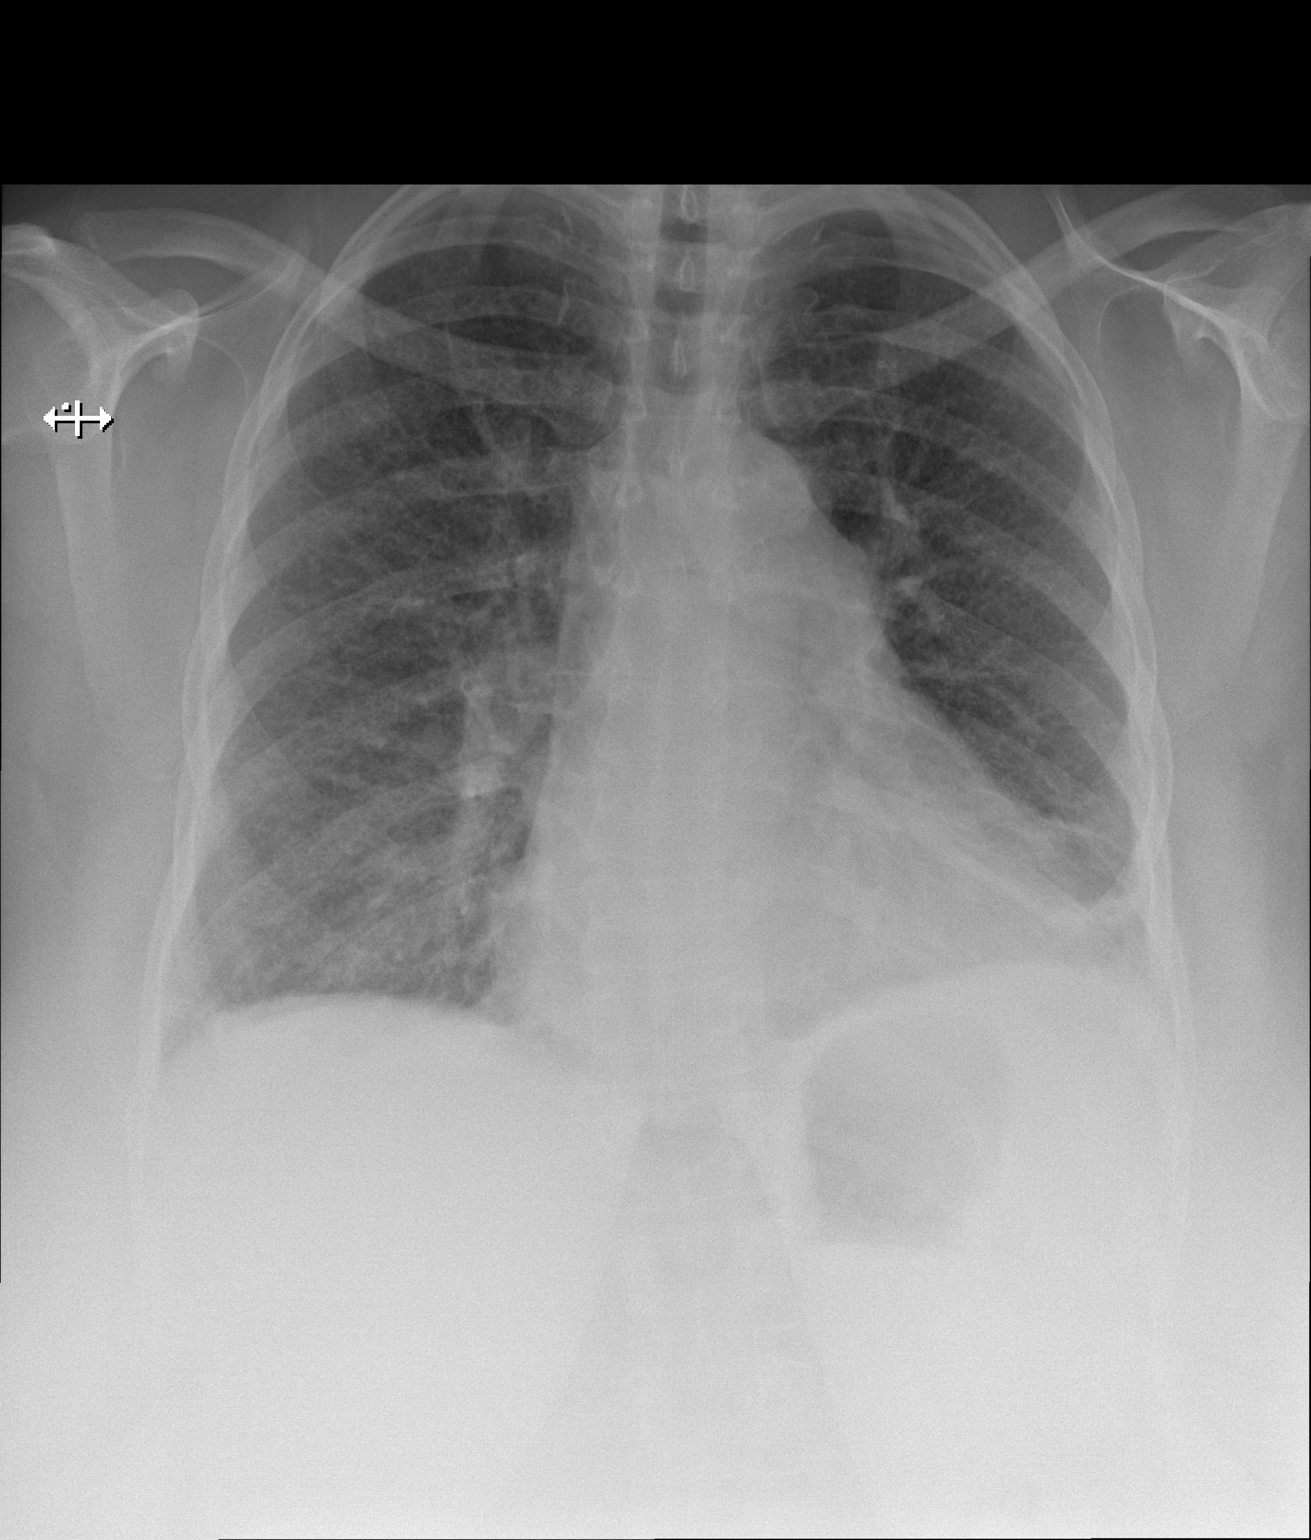
[im 2/2]
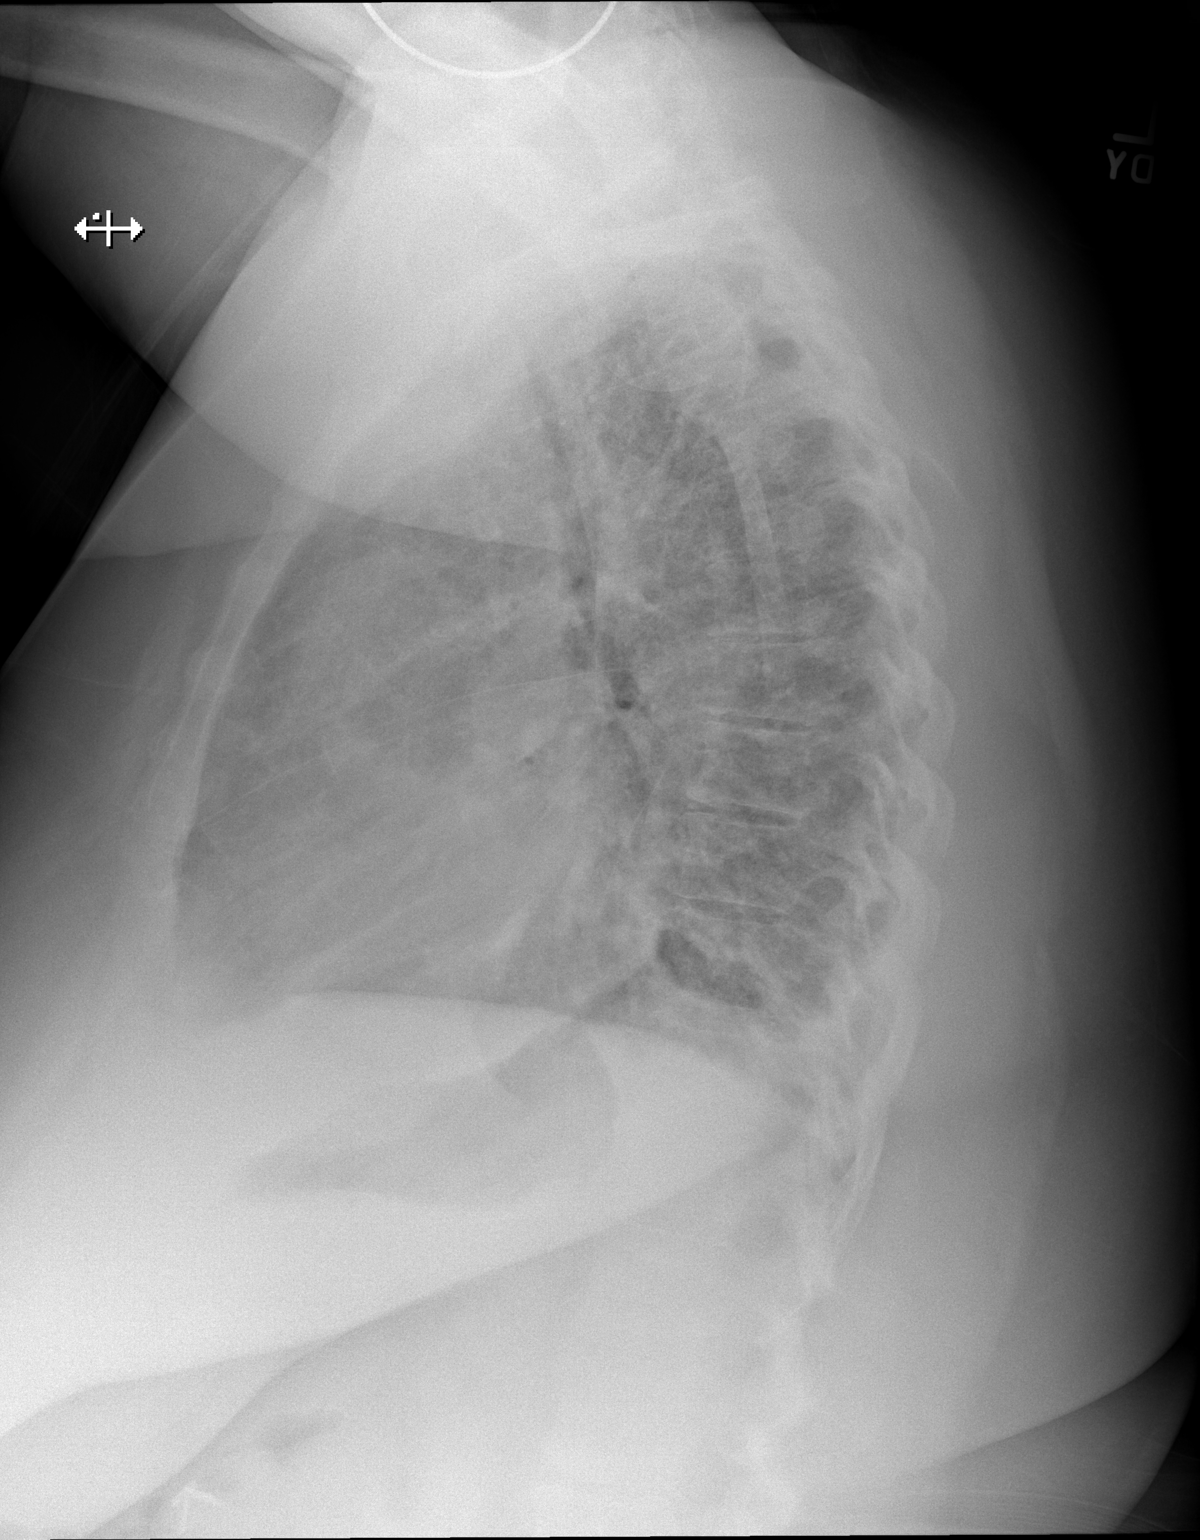

[2 of 2 positions shown; findings below may reference images not displayed]

FINDINGS: Stable mild cardiac enlargement and probable enlargement of the main
pulmonary artery segment. Chronic interstitial and pulmonary
vascular prominence without overt airspace edema. Atelectasis at the
left lung base with potential small left pleural effusion. There is
no evidence of focal airspace consolidation or pneumothorax.
IMPRESSION: Stable cardiac enlargement, dilated main pulmonary artery segment
and chronic lung disease. Atelectasis at the left lung base with
potential small left pleural effusion.

## 2020-05-02 NOTE — Progress Notes (Signed)
Subjective:    Patient ID: [name], female    DOB: 08/21/1965, 54 y.o.   MRN: [id number]  HPI   [name] is a 54 y/o female with a history of COPD, HTN, hyperlipidemia, anxiety, current tobacco use and chronic heart failure.   Patient had an Echo 12/21/19 that showed EF 65-70%, which is improved from TEE on 09/20/17 that showed EF of 60%.  Patient recently admitted on 02/22/20 for chest pain and COPD. Patient reports she has had multiple ER visits and has been intubated multiple times with difficult airway complications.  Patient presents for a follow-up visit with a chief complaint of moderate shortness of breath with minimal exertion (such as washing dishes) but when she sits down, her breathing recovers "pretty quickly" after a few minutes. She has associated fatigue, intermittent dizziness, slight weight gain and constipation. She denies any difficulty sleeping, abdominal distention (unless constipated), palpitations, pedal edema or chest pain. She says that she was dealing with constipation long before she was placed on diuretics. Has tried miralax, colace, laxatives, fiber and mag citrate with long-term relief. She is currently using mag citrate about every other week with results.   Does not have her oxygen with her as she says that she's waiting for more portable tanks to be delivered. Does have oxygen at home with her concentrator and she normally wears it at 4L but does take it off some during the day.   Patient reports she smokes 1/2 PPD and has been smoking for 44 years. Patient reports she is the caregiver for her 4 grandchildren at home and her 46 year old grandchild is often ill due to a birth defect. Patient reports her daughter also lives with her and her son checks on her frequently.    Past Medical History:  Diagnosis Date  . Anxiety   . CHF (congestive heart failure) (HCC)   . COPD (chronic obstructive pulmonary disease) (HCC)   . Essential hypertension   . History  of echocardiogram    a. 08/2013 Echo: EF 55-60%, impaired LV relaxation, mild LVH, nl RV fxn, nl RVSP, mild TR; b. 05/2017 Echo: EF 65-70%, no rwma, nl RV fxn.  . Morbid obesity (HCC)   . Tobacco abuse    Past Surgical History:  Procedure Laterality Date  . ABDOMINAL HYSTERECTOMY    . CHOLECYSTECTOMY    . EXTUBATION (ENDOTRACHEAL) IN OR N/A 08/14/2017   Procedure: EXTUBATION (ENDOTRACHEAL) IN OR;  Surgeon: Geanie Logan, MD;  Location: [facility] ORS;  Service: ENT;  Laterality: N/A;  . HERNIA REPAIR    . TEE WITHOUT CARDIOVERSION N/A 09/20/2017   Procedure: TRANSESOPHAGEAL ECHOCARDIOGRAM (TEE);  Surgeon: Laurier Nancy, MD;  Location: [facility] ORS;  Service: Cardiovascular;  Laterality: N/A;   Family History  Problem Relation Age of Onset  . Dementia Mother   . Heart attack Father    Social History   Tobacco Use  . Smoking status: Current Every Day Smoker    Packs/day: 0.50    Years: 40.00    Pack years: 20.00    Types: Cigarettes  . Smokeless tobacco: Never Used  Substance Use Topics  . Alcohol use: No   No Known Allergies  Prior to Admission medications   Medication Sig Start Date End Date Taking? Authorizing Provider  ADVAIR HFA 115-21 MCG/ACT inhaler Inhale 2 puffs into the lungs 2 (two) times daily. 11/21/19  Yes [provider]  albuterol (PROVENTIL HFA;VENTOLIN HFA) 108 (90 Base) MCG/ACT inhaler Inhale 2 puffs  into the lungs every 6 (six) hours as needed for wheezing or shortness of breath. 06/12/17  Yes Gouru, Aruna, MD  aspirin EC 81 MG tablet Take 81 mg by mouth daily. Swallow whole.   Yes [provider]  fluticasone (FLONASE) 50 MCG/ACT nasal spray Place 1 spray into both nostrils daily. 02/21/20  Yes [provider]  furosemide (LASIX) 40 MG tablet Take 40 mg by mouth 2 (two) times daily. 11/21/19  Yes [provider]  ipratropium-albuterol (DUONEB) 0.5-2.5 (3) MG/3ML SOLN Inhale 3 mLs into the lungs every 6 (six) hours as needed. 01/02/20  Yes  Lurene Shadow, MD  omeprazole (PRILOSEC) 20 MG capsule Take 20 mg by mouth daily.   Yes [provider]  potassium chloride 20 MEQ TBCR Take 40 mEq by mouth daily. 02/24/20  Yes Burnadette Pop, MD  rosuvastatin (CRESTOR) 20 MG tablet Take 20 mg by mouth daily.   Yes [provider]  SPIRIVA HANDIHALER 18 MCG inhalation capsule Place 1 capsule into inhaler and inhale daily. 11/21/19  Yes [provider]    Review of Systems  Constitutional: Negative for appetite change.  HENT: Negative for congestion, postnasal drip and sore throat.   Eyes: Negative.   Respiratory: Positive for cough and shortness of breath (with minimal exertion (washing dishes)). Negative for chest tightness.   Cardiovascular: Negative for chest pain, palpitations and leg swelling.  Gastrointestinal: Positive for constipation (Chronic, attempts to use laxatives). Negative for abdominal distention and abdominal pain.  Endocrine: Negative.   Genitourinary: Negative.   Musculoskeletal: Negative for back pain and neck pain.  Skin: Negative.   Neurological: Positive for dizziness (Positional change). Negative for weakness and light-headedness.  Hematological: Negative for adenopathy. Does not bruise/bleed easily.  Psychiatric/Behavioral: Negative for dysphoric mood and sleep disturbance. The patient is not nervous/anxious.    Vitals:   05/03/20 1426  BP: 102/76  Pulse: (!) 104  Resp: 18  SpO2: (!) 88%  Weight: 240 lb 4 oz (109 kg)  Height: 5' (1.524 m)   Wt Readings from Last 3 Encounters:  05/03/20 240 lb 4 oz (109 kg)  03/03/20 232 lb (105.2 kg)  02/24/20 239 lb 8 oz (108.6 kg)   Lab Results  Component Value Date   CREATININE 0.97 02/24/2020   CREATININE 0.92 02/23/2020   CREATININE 0.79 02/22/2020      Objective:   Physical Exam Constitutional:      Appearance: She is obese. She is not ill-appearing.  HENT:     Head: Normocephalic and atraumatic.  Cardiovascular:     Rate  and Rhythm: Regular rhythm. Tachycardia present.     Heart sounds: Normal heart sounds.  Pulmonary:     Effort: Pulmonary effort is normal.     Breath sounds: No wheezing or rales.  Abdominal:     General: There is no distension.     Palpations: Abdomen is soft.     Tenderness: There is no abdominal tenderness.  Musculoskeletal:        General: No tenderness.     Cervical back: Normal range of motion and neck supple.     Right lower leg: No edema.     Left lower leg: No edema.  Skin:    General: Skin is warm and dry.  Neurological:     General: No focal deficit present.     Mental Status: She is alert and oriented to person, place, and time.  Psychiatric:        Mood and Affect:  Mood normal.        Behavior: Behavior normal.        Thought Content: Thought content normal.        Judgment: Judgment normal.      Assessment & Plan:   1: Chronic heart failure with preserved ejection fraction with structural changes (mild LVH)- - NYHA class III - euvolemic today - weighing daily and reminded to call for an overnight weight gain of >2 pounds or a weekly weight gain of >5 pounds - weight up 8 pounds from last visit here 2 months ago - admits to not being very active as she's afraid of falling and the walker she has is too big for the space she lives in; does have a cane and she was encouraged to use the cane for stability in walking in her home - Encouraged to watch salt intake, try to keep it down to 2000mg  per day of sodium.  - Encouraged to drink only 40-60oz of fluid per day - doubt current BP would tolerate entresto; may need to add metoprolol if HR remains elevated - BNP was 80.4 on 02/23/20  2: COPD:  -Wearing 4L O2 at baseline; does not have it on today and her room air pulse ox was 88% - offered to place patient on oxygen while here but she declines saying that she's fine and will put her oxygen on once she gets home -Patient reports she smokes 1/2 PPD and has been  smoking for 44 years  3: HTN- - BP looks good today - BMP on 02/24/20 showed Sodium 136, Potassium 3.7, Creatinine 0.97, BUN 12 - Saw PCP ([name]) 03/03/20; advised her to contact him regarding her chronic constipation   Patient did not bring her medications nor a list. Each medication was verbally reviewed with the patient and she was encouraged to bring the bottles to every visit to confirm accuracy of list.  Return in 6 months or sooner for any questions/problems before then.

## 2020-05-03 ENCOUNTER — Other Ambulatory Visit: Payer: Self-pay

## 2020-05-03 ENCOUNTER — Ambulatory Visit: Payer: Medicaid Other | Attending: Family | Admitting: Family

## 2020-05-03 ENCOUNTER — Encounter: Payer: Self-pay | Admitting: Family

## 2020-05-03 VITALS — BP 102/76 | HR 104 | Resp 18 | Ht 60.0 in | Wt 240.2 lb

## 2020-05-03 DIAGNOSIS — I110 Hypertensive heart disease with heart failure: Secondary | ICD-10-CM | POA: Diagnosis not present

## 2020-05-03 DIAGNOSIS — Z79899 Other long term (current) drug therapy: Secondary | ICD-10-CM | POA: Diagnosis not present

## 2020-05-03 DIAGNOSIS — I5032 Chronic diastolic (congestive) heart failure: Secondary | ICD-10-CM | POA: Diagnosis present

## 2020-05-03 DIAGNOSIS — Z7951 Long term (current) use of inhaled steroids: Secondary | ICD-10-CM | POA: Diagnosis not present

## 2020-05-03 DIAGNOSIS — I10 Essential (primary) hypertension: Secondary | ICD-10-CM

## 2020-05-03 DIAGNOSIS — F17210 Nicotine dependence, cigarettes, uncomplicated: Secondary | ICD-10-CM | POA: Insufficient documentation

## 2020-05-03 DIAGNOSIS — Z7982 Long term (current) use of aspirin: Secondary | ICD-10-CM | POA: Insufficient documentation

## 2020-05-03 DIAGNOSIS — Z8249 Family history of ischemic heart disease and other diseases of the circulatory system: Secondary | ICD-10-CM | POA: Diagnosis not present

## 2020-05-03 DIAGNOSIS — E785 Hyperlipidemia, unspecified: Secondary | ICD-10-CM | POA: Insufficient documentation

## 2020-05-03 DIAGNOSIS — J449 Chronic obstructive pulmonary disease, unspecified: Secondary | ICD-10-CM | POA: Insufficient documentation

## 2020-05-03 NOTE — Patient Instructions (Signed)
Continue weighing daily and call for an overnight weight gain of > 2 pounds or a weekly weight gain of >5 pounds. 

## 2020-10-26 NOTE — Progress Notes (Signed)
Subjective:    Patient ID: [name], female    DOB: 1965/07/05, 55 y.o.   MRN: [id number]  HPI   [name] is a 55 y/o female with a history of COPD, HTN, hyperlipidemia, anxiety, current tobacco use and chronic heart failure.   Patient had an Echo 12/21/19 that showed EF 65-70%, which is improved from TEE on 09/20/17 that showed EF of 60%.  Has not been admitted or been in the ED in the last 6 months.   Patient presents for a follow-up visit with a chief complaint of moderate shortness of breath upon minimal exertion. She describes this as chronic in nature having been present for several years. She has associated fatigue, cough, constipation, back pain, difficulty sleeping, dizziness, tingling in fingers and gradual weight gain along with this. She denies any abdominal distention, palpitations, pedal edema or chest pain.   She says that she's been taking her furosemide daily instead of BID as she doesn't feel like she needs it twice a day.   Has been drinking 3-4 cans of pepsi daily.   Past Medical History:  Diagnosis Date   Anxiety    CHF (congestive heart failure) (HCC)    COPD (chronic obstructive pulmonary disease) (HCC)    Essential hypertension    History of echocardiogram    a. 08/2013 Echo: EF 55-60%, impaired LV relaxation, mild LVH, nl RV fxn, nl RVSP, mild TR; b. 05/2017 Echo: EF 65-70%, no rwma, nl RV fxn.   Morbid obesity (HCC)    Tobacco abuse    Past Surgical History:  Procedure Laterality Date   ABDOMINAL HYSTERECTOMY     CHOLECYSTECTOMY     EXTUBATION (ENDOTRACHEAL) IN OR N/A 08/14/2017   Procedure: EXTUBATION (ENDOTRACHEAL) IN OR;  Surgeon: Geanie Logan, MD;  Location: [facility] ORS;  Service: ENT;  Laterality: N/A;   HERNIA REPAIR     TEE WITHOUT CARDIOVERSION N/A 09/20/2017   Procedure: TRANSESOPHAGEAL ECHOCARDIOGRAM (TEE);  Surgeon: Laurier Nancy, MD;  Location: [facility] ORS;  Service: Cardiovascular;  Laterality: N/A;   Family History  Problem Relation Age of  Onset   Dementia Mother    Heart attack Father    Social History   Tobacco Use   Smoking status: Every Day    Packs/day: 0.50    Years: 40.00    Pack years: 20.00    Types: Cigarettes   Smokeless tobacco: Never  Substance Use Topics   Alcohol use: No   No Known Allergies  Prior to Admission medications   Medication Sig Start Date End Date Taking? Authorizing Provider  ADVAIR HFA 115-21 MCG/ACT inhaler Inhale 2 puffs into the lungs 2 (two) times daily. 11/21/19  Yes [provider]  aspirin EC 81 MG tablet Take 81 mg by mouth daily. Swallow whole.   Yes [provider]  fluticasone (FLONASE) 50 MCG/ACT nasal spray Place 1 spray into both nostrils daily as needed. 02/21/20  Yes [provider]  furosemide (LASIX) 40 MG tablet Take 40 mg by mouth daily. 11/21/19  Yes [provider]  potassium chloride 20 MEQ TBCR Take 40 mEq by mouth daily. Patient taking differently: Take 20 mEq by mouth daily. 02/24/20  Yes Burnadette Pop, MD  rosuvastatin (CRESTOR) 20 MG tablet Take 20 mg by mouth daily.   Yes [provider]  SPIRIVA HANDIHALER 18 MCG inhalation capsule Place 1 capsule into inhaler and inhale daily. 11/21/19  Yes [provider]  venlafaxine XR (EFFEXOR-XR) 75 MG 24 hr capsule  Take 75 mg by mouth daily with breakfast.   Yes [provider]  albuterol (PROVENTIL HFA;VENTOLIN HFA) 108 (90 Base) MCG/ACT inhaler Inhale 2 puffs into the lungs every 6 (six) hours as needed for wheezing or shortness of breath. Patient not taking: Reported on 10/27/2020 06/12/17   Ramonita Lab, MD  ipratropium-albuterol (DUONEB) 0.5-2.5 (3) MG/3ML SOLN Inhale 3 mLs into the lungs every 6 (six) hours as needed. Patient not taking: Reported on 10/27/2020 01/02/20   Lurene Shadow, MD  omeprazole (PRILOSEC) 20 MG capsule Take 20 mg by mouth daily. Patient not taking: Reported on 10/27/2020    [provider]   Review of Systems  Constitutional:   Positive for fatigue. Negative for appetite change.  HENT:  Negative for congestion, postnasal drip and sore throat.   Eyes: Negative.   Respiratory:  Positive for cough and shortness of breath (with minimal exertion (washing dishes)). Negative for chest tightness.   Cardiovascular:  Negative for chest pain, palpitations and leg swelling.  Gastrointestinal:  Positive for constipation (Chronic). Negative for abdominal distention and abdominal pain.  Endocrine: Negative.   Genitourinary: Negative.   Musculoskeletal:  Positive for back pain. Negative for neck pain.  Skin: Negative.   Allergic/Immunologic: Negative.   Neurological:  Positive for dizziness (Positional change) and numbness (tingling in hands/feet intermittent during the day). Negative for weakness and light-headedness.  Hematological:  Negative for adenopathy. Does not bruise/bleed easily.  Psychiatric/Behavioral:  Positive for sleep disturbance (trouble sleeping last night due to concentrator issues). Negative for dysphoric mood. The patient is not nervous/anxious.    Vitals:   10/27/20 1408  BP: (!) 102/55  Pulse: 96  Resp: 20  SpO2: 97%  Weight: 253 lb 4 oz (114.9 kg)  Height: 5' (1.524 m)   Wt Readings from Last 3 Encounters:  10/27/20 253 lb 4 oz (114.9 kg)  05/03/20 240 lb 4 oz (109 kg)  03/03/20 232 lb (105.2 kg)   Lab Results  Component Value Date   CREATININE 0.97 02/24/2020   CREATININE 0.92 02/23/2020   CREATININE 0.79 02/22/2020      Objective:   Physical Exam Constitutional:      Appearance: She is obese. She is not ill-appearing.  HENT:     Head: Normocephalic and atraumatic.  Cardiovascular:     Rate and Rhythm: Normal rate and regular rhythm.     Heart sounds: Normal heart sounds.  Pulmonary:     Effort: Pulmonary effort is normal.     Breath sounds: No wheezing or rales.  Abdominal:     General: There is no distension.     Palpations: Abdomen is soft.     Tenderness: There is no abdominal  tenderness.  Musculoskeletal:        General: No tenderness.     Cervical back: Normal range of motion and neck supple.     Right lower leg: No edema.     Left lower leg: No edema.  Skin:    General: Skin is warm and dry.  Neurological:     General: No focal deficit present.     Mental Status: She is alert and oriented to person, place, and time.  Psychiatric:        Mood and Affect: Mood normal.        Behavior: Behavior normal.        Thought Content: Thought content normal.        Judgment: Judgment normal.     Assessment & Plan:  1: Chronic heart failure with preserved ejection fraction with structural changes (mild LVH)- - NYHA class III - euvolemic today - weighing daily and reminded to call for an overnight weight gain of >2 pounds or a weekly weight gain of >5 pounds - weight up 13 pounds from last visit here 6 months ago - admits to not being very active as she's afraid of falling  - Encouraged to watch salt intake, try to keep it down to 2000mg  per day of sodium.  - drinking 4 cans of pepsi daily and she was encouraged to drink less pepsi and replace with water - current BP will not tolerate entresto  - BNP was 80.4 on 02/23/20 - PharmD reconciled medications with the patient  2: COPD:  - Wearing 4L O2 at baseline - Patient reports she continues to smoke 1/2 PPD and has been smoking for 44 years - does report removing herself from oxygen and turing it off when smoking  3: HTN- - BP good although on the low side (102/55) - BMP on 02/24/20 showed Sodium 136, Potassium 3.7, Creatinine 0.97, BUN 12 - had telemedicine visit with PCP ([name]) 07/19/20   Medication bottles reviewed.   Return in 4 months or sooner for any questions/problems before then.

## 2020-10-27 ENCOUNTER — Encounter: Payer: Self-pay | Admitting: Pharmacist

## 2020-10-27 ENCOUNTER — Other Ambulatory Visit: Payer: Self-pay

## 2020-10-27 ENCOUNTER — Encounter: Payer: Self-pay | Admitting: Family

## 2020-10-27 ENCOUNTER — Ambulatory Visit: Payer: Medicaid Other | Attending: Family | Admitting: Family

## 2020-10-27 VITALS — BP 102/55 | HR 96 | Resp 20 | Ht 60.0 in | Wt 253.2 lb

## 2020-10-27 DIAGNOSIS — F17210 Nicotine dependence, cigarettes, uncomplicated: Secondary | ICD-10-CM | POA: Diagnosis not present

## 2020-10-27 DIAGNOSIS — Z7982 Long term (current) use of aspirin: Secondary | ICD-10-CM | POA: Diagnosis not present

## 2020-10-27 DIAGNOSIS — F419 Anxiety disorder, unspecified: Secondary | ICD-10-CM | POA: Diagnosis not present

## 2020-10-27 DIAGNOSIS — I5032 Chronic diastolic (congestive) heart failure: Secondary | ICD-10-CM

## 2020-10-27 DIAGNOSIS — I10 Essential (primary) hypertension: Secondary | ICD-10-CM

## 2020-10-27 DIAGNOSIS — E785 Hyperlipidemia, unspecified: Secondary | ICD-10-CM | POA: Insufficient documentation

## 2020-10-27 DIAGNOSIS — Z9981 Dependence on supplemental oxygen: Secondary | ICD-10-CM | POA: Insufficient documentation

## 2020-10-27 DIAGNOSIS — J449 Chronic obstructive pulmonary disease, unspecified: Secondary | ICD-10-CM | POA: Diagnosis not present

## 2020-10-27 DIAGNOSIS — I110 Hypertensive heart disease with heart failure: Secondary | ICD-10-CM | POA: Insufficient documentation

## 2020-10-27 NOTE — Progress Notes (Signed)
[facility] - HEART FAILURE CLINIC - PHARMACIST COUNSELING NOTE  Guideline-Directed Medical Therapy/Evidence Based Medicine  ACE/ARB/ARNI: N/A Beta Blocker:  N/A Aldosterone Antagonist:  N/A Diuretic: Furosemide 40 mg daily SGLT2i:  N/A  Adherence Assessment  Do you ever forget to take your medication? [] Yes [x] No  Do you ever skip doses due to side effects? [] Yes [x] No  Do you have trouble affording your medicines? [] Yes [x] No  Are you ever unable to pick up your medication due to transportation difficulties? [] Yes [x] No  Do you ever stop taking your medications because you don't believe they are helping? [] Yes [x] No  Do you check your weight daily? [x] Yes* [] No  *Pt reports checking weight a few times per week   Adherence strategy: Pillbox  Barriers to obtaining medications: N/A  Vital signs: HR 96, BP 102/55, weight (pounds) 253 (up from 240 lbs 05/03/2020) ECHO: Date 12/21/2019, EF 65-70%, notes: mild LVH; LV no wall abnormalities  BMP Latest Ref Rng & Units 02/24/2020 02/23/2020 02/22/2020  Glucose 70 - 99 mg/dL ) ) )  BUN 6 - 20 mg/dL 12 11 10   Creatinine 0.44 - 1.00 mg/dL    Sodium 135 - 145 mmol/L 136 135 132(L)  Potassium 3.5 - 5.1 mmol/L 3.7 3.4(L) 3.7  Chloride 98 - 111 mmol/L 95(L) 95(L) 91(L)  CO2 22 - 32 mmol/L 30 29 27   Calcium 8.9 - 10.3 mg/dL ) 05/05/2020) 8.9    Past Medical History:  Diagnosis Date   Anxiety    CHF (congestive heart failure) (HCC)    COPD (chronic obstructive pulmonary disease) (HCC)    Essential hypertension    History of echocardiogram    a. 08/2013 Echo: EF 55-60%, impaired LV relaxation, mild LVH, nl RV fxn, nl RVSP, mild TR; b. 05/2017 Echo: EF 65-70%, no rwma, nl RV fxn.   Morbid obesity (HCC)    Tobacco abuse     ASSESSMENT 55 year old female who presents to the HF clinic for a follow up visit. Pt reports some episodes of feeling lightheaded/dizzy when standing up too  quickly or bending over. Pt BP on the lower side today and she has not yet taken her medications. Pt furosemide is prescribed as 40 mg twice daily however pt reports only taking once daily and states she has not noticed any additional swelling/edema. Pt has not needed her nebulizer solution or rescue inhaler since 03/2020. Pt is on 3-4 L oxygen at home.   Recent ED Visit (past 6 months): Date - N/A  PLAN CHF/HTN -discussed with provider, given pt low BP and no additional swelling/edema, okay for pt to continue furosemide 40 mg daily and pt instructed that she may take an additional 40 mg as needed -addition of GDMT limited by pt blood pressure -continue to monitor weight  HLD -continue rosuvastatin 20 mg daily  COPD -continue Advair 115-21 mcg/act 2 puffs twice daily and Spiriva once daily -continue albuterol and Duonebs as needed    Time spent: 10 minutes  086(V, PharmD Pharmacy Resident  10/27/2020 2:28 PM    Current Outpatient Medications:    ADVAIR HFA 115-21 MCG/ACT inhaler, Inhale 2 puffs into the lungs 2 (two) times daily., Disp: , Rfl:    albuterol (PROVENTIL HFA;VENTOLIN HFA) 108 (90 Base) MCG/ACT inhaler, Inhale 2 puffs into the lungs every 6 (six) hours as needed for wheezing or shortness of breath. (Patient not taking: Reported on 10/27/2020), Disp: 1 Inhaler, Rfl: 2   aspirin EC 81 MG tablet,  Take 81 mg by mouth daily. Swallow whole., Disp: , Rfl:    fluticasone (FLONASE) 50 MCG/ACT nasal spray, Place 1 spray into both nostrils daily as needed., Disp: , Rfl:    furosemide (LASIX) 40 MG tablet, Take 40 mg by mouth daily., Disp: , Rfl:    ipratropium-albuterol (DUONEB) 0.5-2.5 (3) MG/3ML SOLN, Inhale 3 mLs into the lungs every 6 (six) hours as needed. (Patient not taking: Reported on 10/27/2020), Disp: 360 mL, Rfl:    omeprazole (PRILOSEC) 20 MG capsule, Take 20 mg by mouth daily. (Patient not taking: Reported on 10/27/2020), Disp: , Rfl:    potassium chloride 20  MEQ TBCR, Take 40 mEq by mouth daily. (Patient taking differently: Take 20 mEq by mouth daily.), Disp: 60 tablet, Rfl: 1   rosuvastatin (CRESTOR) 20 MG tablet, Take 20 mg by mouth daily., Disp: , Rfl:    SPIRIVA HANDIHALER 18 MCG inhalation capsule, Place 1 capsule into inhaler and inhale daily., Disp: , Rfl:    venlafaxine XR (EFFEXOR-XR) 75 MG 24 hr capsule, Take 75 mg by mouth daily with breakfast., Disp: , Rfl:    DRUGS TO CAUTION IN HEART FAILURE  Drug or Class Mechanism  Analgesics NSAIDs COX-2 inhibitors Glucocorticoids  Sodium and water retention, increased systemic vascular resistance, decreased response to diuretics   Diabetes Medications Metformin Thiazolidinediones Rosiglitazone (Avandia) Pioglitazone (Actos) DPP4 Inhibitors Saxagliptin (Onglyza) Sitagliptin (Januvia)   Lactic acidosis Possible calcium channel blockade   Unknown  Antiarrhythmics Class I  Flecainide Disopyramide Class III Sotalol Other Dronedarone  Negative inotrope, proarrhythmic   Proarrhythmic, beta blockade  Negative inotrope  Antihypertensives Alpha Blockers Doxazosin Calcium Channel Blockers Diltiazem Verapamil Nifedipine Central Alpha Adrenergics Moxonidine Peripheral Vasodilators Minoxidil  Increases renin and aldosterone  Negative inotrope    Possible sympathetic withdrawal  Unknown  Anti-infective Itraconazole Amphotericin B  Negative inotrope Unknown  Hematologic Anagrelide Cilostazol   Possible inhibition of PD IV Inhibition of PD III causing arrhythmias  Neurologic/Psychiatric Stimulants Anti-Seizure Drugs Carbamazepine Pregabalin Antidepressants Tricyclics Citalopram Parkinsons Bromocriptine Pergolide Pramipexole Antipsychotics Clozapine Antimigraine Ergotamine Methysergide Appetite suppressants Bipolar Lithium  Peripheral alpha and beta agonist activity  Negative inotrope and chronotrope Calcium channel blockade  Negative  inotrope, proarrhythmic Dose-dependent QT prolongation  Excessive serotonin activity/valvular damage Excessive serotonin activity/valvular damage Unknown  IgE mediated hypersensitivy, calcium channel blockade  Excessive serotonin activity/valvular damage Excessive serotonin activity/valvular damage Valvular damage  Direct myofibrillar degeneration, adrenergic stimulation  Antimalarials Chloroquine Hydroxychloroquine Intracellular inhibition of lysosomal enzymes  Urologic Agents Alpha Blockers Doxazosin Prazosin Tamsulosin Terazosin  Increased renin and aldosterone  Adapted from Page Williemae Natter, et al. "Drugs That May Cause or Exacerbate Heart Failure: A Scientific Statement from the American Heart  Association." Circulation 2016; 134:e32-e69. DOI: 10.1161/CIR.0000000000000426   MEDICATION ADHERENCES TIPS AND STRATEGIES Taking medication as prescribed improves patient outcomes in heart failure (reduces hospitalizations, improves symptoms, increases survival) Side effects of medications can be managed by decreasing doses, switching agents, stopping drugs, or adding additional therapy. Please let someone in the Heart Failure Clinic know if you have having bothersome side effects so we can modify your regimen. Do not alter your medication regimen without talking to [place].  Medication reminders can help patients remember to take drugs on time. If you are missing or forgetting doses you can try linking behaviors, using pill boxes, or an electronic reminder like an alarm on your phone or an app. Some people can also get automated phone calls as medication reminders.

## 2020-10-27 NOTE — Patient Instructions (Signed)
Continue weighing daily and call for an overnight weight gain of > 2 pounds or a weekly weight gain of >5 pounds. 

## 2021-02-22 ENCOUNTER — Ambulatory Visit: Payer: Medicaid Other | Admitting: Family

## 2021-02-26 NOTE — Progress Notes (Signed)
Subjective:    Patient ID: [name], female    DOB: 06-26-1965, 55 y.o.   MRN: [id number]  HPI   [name] is a 55 y/o female with a history of COPD, HTN, hyperlipidemia, anxiety, current tobacco use and chronic heart failure.   Patient had an Echo 12/21/19 that showed EF 65-70%, which is improved from TEE on 09/20/17 that showed EF of 60%.  Has not been admitted or been in the ED in the last 6 months.   Patient presents for a follow-up visit with a chief complaint of moderate shortness of breath with minimal exertion. She says that this has been chronic in nature having been present for several months. She has associated fatigue, cough, chronic constipation, difficulty sleeping, back pain, dizziness and gradual weight gain along with this. She denies any weakness, abdominal distention, palpitations, pedal edema or chest pain.   Has been drinking 3-4 cans of pepsi daily which is less than she had previously been doing. She is trying to drink water in between.    Past Medical History:  Diagnosis Date   Anxiety    CHF (congestive heart failure) (HCC)    COPD (chronic obstructive pulmonary disease) (HCC)    Essential hypertension    History of echocardiogram    a. 08/2013 Echo: EF 55-60%, impaired LV relaxation, mild LVH, nl RV fxn, nl RVSP, mild TR; b. 05/2017 Echo: EF 65-70%, no rwma, nl RV fxn.   Morbid obesity (HCC)    Tobacco abuse    Past Surgical History:  Procedure Laterality Date   ABDOMINAL HYSTERECTOMY     CHOLECYSTECTOMY     EXTUBATION (ENDOTRACHEAL) IN OR N/A 08/14/2017   Procedure: EXTUBATION (ENDOTRACHEAL) IN OR;  Surgeon: Geanie Logan, MD;  Location: [facility] ORS;  Service: ENT;  Laterality: N/A;   HERNIA REPAIR     TEE WITHOUT CARDIOVERSION N/A 09/20/2017   Procedure: TRANSESOPHAGEAL ECHOCARDIOGRAM (TEE);  Surgeon: Laurier Nancy, MD;  Location: [facility] ORS;  Service: Cardiovascular;  Laterality: N/A;   Family History  Problem Relation Age of Onset   Dementia Mother     Heart attack Father    Social History   Tobacco Use   Smoking status: Every Day    Packs/day: 0.50    Years: 40.00    Pack years: 20.00    Types: Cigarettes   Smokeless tobacco: Never  Substance Use Topics   Alcohol use: No   No Known Allergies  Prior to Admission medications   Medication Sig Start Date End Date Taking? Authorizing Provider  ADVAIR HFA 115-21 MCG/ACT inhaler Inhale 2 puffs into the lungs 2 (two) times daily. 11/21/19   [provider]  albuterol (PROVENTIL HFA;VENTOLIN HFA) 108 (90 Base) MCG/ACT inhaler Inhale 2 puffs into the lungs every 6 (six) hours as needed for wheezing or shortness of breath. Patient not taking: Reported on 10/27/2020 06/12/17   Ramonita Lab, MD  aspirin EC 81 MG tablet Take 81 mg by mouth daily. Swallow whole.    [provider]  fluticasone (FLONASE) 50 MCG/ACT nasal spray Place 1 spray into both nostrils daily as needed. 02/21/20   [provider]  furosemide (LASIX) 40 MG tablet Take 40 mg by mouth daily. 11/21/19   [provider]  ipratropium-albuterol (DUONEB) 0.5-2.5 (3) MG/3ML SOLN Inhale 3 mLs into the lungs every 6 (six) hours as needed. Patient not taking: Reported on 10/27/2020 01/02/20   Lurene Shadow, MD  omeprazole (PRILOSEC) 20 MG capsule Take 20 mg by  mouth daily. Patient not taking: Reported on 10/27/2020    [provider]  potassium chloride 20 MEQ TBCR Take 40 mEq by mouth daily. Patient taking differently: Take 20 mEq by mouth daily. 02/24/20   Burnadette Pop, MD  rosuvastatin (CRESTOR) 20 MG tablet Take 20 mg by mouth daily.    [provider]  SPIRIVA HANDIHALER 18 MCG inhalation capsule Place 1 capsule into inhaler and inhale daily. 11/21/19   [provider]  venlafaxine XR (EFFEXOR-XR) 75 MG 24 hr capsule Take 75 mg by mouth daily with breakfast.    [provider]    Review of Systems  Constitutional:  Positive for fatigue. Negative for appetite  change.  HENT:  Negative for congestion, postnasal drip and sore throat.   Eyes: Negative.   Respiratory:  Positive for cough and shortness of breath (with minimal exertion (washing dishes)). Negative for chest tightness.   Cardiovascular:  Negative for chest pain, palpitations and leg swelling.  Gastrointestinal:  Positive for constipation (Chronic). Negative for abdominal distention and abdominal pain.  Endocrine: Negative.   Genitourinary: Negative.   Musculoskeletal:  Positive for back pain. Negative for neck pain.  Skin: Negative.   Allergic/Immunologic: Negative.   Neurological:  Positive for dizziness (Positional change) and numbness ("sensation" in feet intermittent during the day). Negative for weakness and light-headedness.  Hematological:  Negative for adenopathy. Does not bruise/bleed easily.  Psychiatric/Behavioral:  Positive for sleep disturbance (chronic trouble sleeping; wearing oxygen at 4L). Negative for dysphoric mood. The patient is not nervous/anxious.       Objective:   Vitals:   02/28/21 1509  BP: (!) 149/78  Pulse: 100  Resp: 20  SpO2: 96%  Weight: 268 lb (121.6 kg)  [name]: 5' (1.524 m)   Wt Readings from Last 3 Encounters:  02/28/21 268 lb (121.6 kg)  10/27/20 253 lb 4 oz (114.9 kg)  05/03/20 240 lb 4 oz (109 kg)   Lab Results  Component Value Date   CREATININE 0.97 02/24/2020   CREATININE 0.92 02/23/2020   CREATININE 0.79 02/22/2020   Physical Exam Constitutional:      Appearance: She is obese. She is not ill-appearing.  HENT:     Head: Normocephalic and atraumatic.  Cardiovascular:     Rate and Rhythm: Normal rate and regular rhythm.     Heart sounds: Normal heart sounds.  Pulmonary:     Effort: Pulmonary effort is normal.     Breath sounds: No wheezing or rales.  Abdominal:     General: There is no distension.     Palpations: Abdomen is soft.     Tenderness: There is no abdominal tenderness.  Musculoskeletal:        General: No  tenderness.     Cervical back: Normal range of motion and neck supple.     Right lower leg: No edema.     Left lower leg: No edema.  Skin:    General: Skin is warm and dry.  Neurological:     General: No focal deficit present.     Mental Status: She is alert and oriented to person, place, and time.  Psychiatric:        Mood and Affect: Mood normal.        Behavior: Behavior normal.        Thought Content: Thought content normal.        Judgment: Judgment normal.     Assessment & Plan:   1: Chronic heart failure with preserved ejection fraction  with structural changes (mild LVH)- - NYHA class III - euvolemic today - weighing daily and reminded to call for an overnight weight gain of >2 pounds or a weekly weight gain of >5 pounds - weight up 15 pounds from last visit here 4 months ago - admits to not being very active as she feels like she's still recovering from covid that she contracted 2 months ago - Encouraged to watch salt intake, try to keep it down to 2000mg  per day of sodium.  - still drinking 3-4 cans of pepsi and 2-3 bottles of water daily - if BP remains normal (has been low in the past)  consider adding entresto at next visit - BNP was 80.4 on 02/23/20 - received flu vaccine for this season  2: COPD:  - Wearing 4L O2 at baseline - Patient reports she continues to smoke 1/2 PPD and has been smoking for 44 years - does report removing herself from oxygen and turing it off when smoking  3: HTN- - BP mildly elevated today; has been low in the past - BMP on 02/24/20 showed Sodium 136, Potassium 3.7, Creatinine 0.97, BUN 12 - saw PCP ([name]) 02/25/21   Medication list reviewed.   Return in 6 months or sooner for any questions/problems before then.

## 2021-02-28 ENCOUNTER — Other Ambulatory Visit: Payer: Self-pay

## 2021-02-28 ENCOUNTER — Encounter: Payer: Self-pay | Admitting: Family

## 2021-02-28 ENCOUNTER — Ambulatory Visit: Payer: Medicaid Other | Attending: Family | Admitting: Family

## 2021-02-28 VITALS — BP 149/78 | HR 100 | Resp 20 | Ht 60.0 in | Wt 268.0 lb

## 2021-02-28 DIAGNOSIS — Z8249 Family history of ischemic heart disease and other diseases of the circulatory system: Secondary | ICD-10-CM | POA: Insufficient documentation

## 2021-02-28 DIAGNOSIS — F17210 Nicotine dependence, cigarettes, uncomplicated: Secondary | ICD-10-CM | POA: Diagnosis not present

## 2021-02-28 DIAGNOSIS — Z7951 Long term (current) use of inhaled steroids: Secondary | ICD-10-CM | POA: Insufficient documentation

## 2021-02-28 DIAGNOSIS — I5032 Chronic diastolic (congestive) heart failure: Secondary | ICD-10-CM | POA: Diagnosis not present

## 2021-02-28 DIAGNOSIS — M549 Dorsalgia, unspecified: Secondary | ICD-10-CM | POA: Diagnosis not present

## 2021-02-28 DIAGNOSIS — K5909 Other constipation: Secondary | ICD-10-CM | POA: Diagnosis not present

## 2021-02-28 DIAGNOSIS — I110 Hypertensive heart disease with heart failure: Secondary | ICD-10-CM | POA: Diagnosis not present

## 2021-02-28 DIAGNOSIS — Z79899 Other long term (current) drug therapy: Secondary | ICD-10-CM | POA: Diagnosis not present

## 2021-02-28 DIAGNOSIS — Z7982 Long term (current) use of aspirin: Secondary | ICD-10-CM | POA: Diagnosis not present

## 2021-02-28 DIAGNOSIS — E785 Hyperlipidemia, unspecified: Secondary | ICD-10-CM | POA: Insufficient documentation

## 2021-02-28 DIAGNOSIS — F419 Anxiety disorder, unspecified: Secondary | ICD-10-CM | POA: Insufficient documentation

## 2021-02-28 DIAGNOSIS — J449 Chronic obstructive pulmonary disease, unspecified: Secondary | ICD-10-CM | POA: Diagnosis not present

## 2021-02-28 DIAGNOSIS — I10 Essential (primary) hypertension: Secondary | ICD-10-CM

## 2021-02-28 NOTE — Patient Instructions (Signed)
Continue weighing daily and call for an overnight weight gain of > 2 pounds or a weekly weight gain of >5 pounds. 

## 2021-06-26 ENCOUNTER — Emergency Department
Admission: EM | Admit: 2021-06-26 | Discharge: 2021-06-26 | Disposition: A | Discharge status: Home / Self Care | Payer: Medicaid Other | Attending: Emergency Medicine | Admitting: Emergency Medicine

## 2021-06-26 ENCOUNTER — Encounter: Payer: Self-pay | Admitting: Emergency Medicine

## 2021-06-26 ENCOUNTER — Other Ambulatory Visit: Payer: Self-pay

## 2021-06-26 DIAGNOSIS — E1165 Type 2 diabetes mellitus with hyperglycemia: Secondary | ICD-10-CM | POA: Diagnosis not present

## 2021-06-26 DIAGNOSIS — I509 Heart failure, unspecified: Secondary | ICD-10-CM | POA: Diagnosis not present

## 2021-06-26 DIAGNOSIS — R739 Hyperglycemia, unspecified: Secondary | ICD-10-CM

## 2021-06-26 DIAGNOSIS — J449 Chronic obstructive pulmonary disease, unspecified: Secondary | ICD-10-CM | POA: Diagnosis not present

## 2021-06-26 LAB — CBC
HCT: 35.5 % — ABNORMAL LOW (ref 36.0–46.0)
Hemoglobin: 11.3 g/dL — ABNORMAL LOW (ref 12.0–15.0)
MCH: 27.8 pg (ref 26.0–34.0)
MCHC: 31.8 g/dL (ref 30.0–36.0)
MCV: 87.2 fL (ref 80.0–100.0)
Platelets: 315 10*3/uL (ref 150–400)
RBC: 4.07 MIL/uL (ref 3.87–5.11)
RDW: 13.2 % (ref 11.5–15.5)
WBC: 12.7 10*3/uL — ABNORMAL HIGH (ref 4.0–10.5)
nRBC: 0 % (ref 0.0–0.2)

## 2021-06-26 LAB — COMPREHENSIVE METABOLIC PANEL
ALT: 16 U/L (ref 0–44)
AST: 13 U/L — ABNORMAL LOW (ref 15–41)
Albumin: 3.5 g/dL (ref 3.5–5.0)
Alkaline Phosphatase: 100 U/L (ref 38–126)
Anion gap: 8 (ref 5–15)
BUN: 27 mg/dL — ABNORMAL HIGH (ref 6–20)
CO2: 36 mmol/L — ABNORMAL HIGH (ref 22–32)
Calcium: 8.7 mg/dL — ABNORMAL LOW (ref 8.9–10.3)
Chloride: 83 mmol/L — ABNORMAL LOW (ref 98–111)
Creatinine, Ser: 1.19 mg/dL — ABNORMAL HIGH (ref 0.44–1.00)
GFR, Estimated: 53 mL/min — ABNORMAL LOW (ref >60)
Glucose, Bld: 589 mg/dL (ref 70–99)
Potassium: 4.5 mmol/L (ref 3.5–5.1)
Sodium: 127 mmol/L — ABNORMAL LOW (ref 135–145)
Total Bilirubin: 0.8 mg/dL (ref 0.3–1.2)
Total Protein: 6.7 g/dL (ref 6.5–8.1)

## 2021-06-26 LAB — CBG MONITORING, ED
Glucose-Capillary: >600 mg/dL (ref 70–99)
Glucose-Capillary: 473 mg/dL — ABNORMAL HIGH (ref 70–99)

## 2021-06-26 LAB — URINALYSIS, ROUTINE W REFLEX MICROSCOPIC
Bacteria, UA: NONE SEEN
Bilirubin Urine: NEGATIVE
Glucose, UA: >=500 mg/dL — AB
Hgb urine dipstick: NEGATIVE
Ketones, ur: NEGATIVE mg/dL
Leukocytes,Ua: NEGATIVE
Nitrite: NEGATIVE
Protein, ur: NEGATIVE mg/dL
Specific Gravity, Urine: 1.024 (ref 1.005–1.030)
pH: 6 (ref 5.0–8.0)

## 2021-06-26 LAB — BLOOD GAS, VENOUS
Acid-Base Excess: 12.9 mmol/L — ABNORMAL HIGH (ref 0.0–2.0)
Bicarbonate: 39.9 mmol/L — ABNORMAL HIGH (ref 20.0–28.0)
O2 Saturation: 76.8 %
Patient temperature: 37
pCO2, Ven: 63 mmHg — ABNORMAL HIGH (ref 44.0–60.0)
pH, Ven: 7.41 (ref 7.250–7.430)
pO2, Ven: 41 mmHg (ref 32.0–45.0)

## 2021-06-26 LAB — HEMOGLOBIN A1C
Hgb A1c MFr Bld: 11 % — ABNORMAL HIGH (ref 4.8–5.6)
Mean Plasma Glucose: 269 mg/dL

## 2021-06-26 MED ORDER — LACTATED RINGERS IV BOLUS
2000.0000 mL | Freq: Once | INTRAVENOUS | Status: AC
Start: 2021-06-26 — End: 2021-06-26
  Administered 2021-06-26: 2000 mL via INTRAVENOUS

## 2021-06-26 MED ORDER — LACTATED RINGERS IV BOLUS
1000.0000 mL | Freq: Once | INTRAVENOUS | Status: AC
Start: 2021-06-26 — End: 2021-06-26
  Administered 2021-06-26: 1000 mL via INTRAVENOUS

## 2021-06-26 NOTE — ED Notes (Signed)
D/C and reasons to return to ED discussed with pt, pt verbalized understanding. NAD noted. VSS. Family with pt.

## 2021-06-26 NOTE — ED Triage Notes (Signed)
Pt to ED via POV, states increased thirst, blurry vision, and high blood sugar on home meter. Pt states has been told she has DM but is not on any medications for such. CBG > 600 in triage. Pt states portable O2 was dying and was not on baseline 4L, pt placed on 4L via [place] in triage, O2 sats 96% in triage.

## 2021-06-26 NOTE — ED Notes (Signed)
Pt resting comfortably at this time. Pt appears to have a productive cough at this time. Pt states HX of COPD and chronic supplemental O2 use at 4L/min. Pt denies any new or worsening SOB at this time.

## 2021-06-26 NOTE — ED Notes (Signed)
Repeat CBG , MD aware, pt to be discharged.

## 2021-06-26 NOTE — ED Provider Notes (Signed)
Patient was signed out to me pending 3rd L of fluids and reassessment of her blood sugar.   After third liter her sugar is 473 which is improved.  Will discharge.  He does not wish to start metformin and will call her doctor tomorrow.   Rada Hay, MD 06/26/21 [phone]

## 2021-06-26 NOTE — ED Provider Notes (Signed)
[facility] Provider Note    Event Date/Time   First MD Initiated Contact with Patient 06/26/21 [phone]     (approximate)   History   Hyperglycemia   HPI  [name] is a 56 y.o. female with medical history that includes morbid obesity, CHF, COPD, and hyperlipidemia.  She presents tonight for evaluation of high blood sugar at home as well as increased thirst, increased urination, and some blurry vision recently.  She is not in pain.  She has had some nausea but no vomiting.  The symptoms have been gradually getting worse over about a week.  She reports that in the past she has been told that she has diabetes but "they never prescribed anything".  She sees [facility] primary care.  She said that she has an appointment about once every 6 months and they told her wants that her hemoglobin A1c was elevated but did not start her on any medication.     Physical Exam   Triage Vital Signs: ED Triage Vitals  Enc Vitals Group     BP 06/26/21 0334 113/68     Pulse Rate 06/26/21 0334 (!) 104     Resp 06/26/21 0334 17     Temp 06/26/21 0334 98.4 F (36.9 C)     Temp Source 06/26/21 0334 Oral     SpO2 06/26/21 0334 (!) 83 %     Weight 06/26/21 0343 117.9 kg (260 lb)     Height 06/26/21 0343 1.524 m (5')     Head Circumference --      Peak Flow --      Pain Score 06/26/21 0344 0     Pain Loc --      Pain Edu? --      Excl. in GC? --     Most recent vital signs: Vitals:   06/26/21 0700 06/26/21 0730  BP: 140/79 (!) 141/75  Pulse: 86 83  Resp: 20 (!) 21  Temp:    SpO2: 98% 98%     General: Awake, no distress.  CV:  Good peripheral perfusion.  Resp:  Normal effort.  Abd:  Morbid obesity.  No distention.  No tenderness to palpation.   ED Results / Procedures / Treatments   Labs (all labs ordered are listed, but only abnormal results are displayed) Labs Reviewed  CBC - Abnormal; Notable for the following components:      Result Value   WBC 12.7 (*)     Hemoglobin 11.3 (*)    HCT 35.5 (*)    All other components within normal limits  URINALYSIS, ROUTINE W REFLEX MICROSCOPIC - Abnormal; Notable for the following components:   Color, Urine STRAW (*)    APPearance CLEAR (*)    Glucose, UA >=500 (*)    All other components within normal limits  BLOOD GAS, VENOUS - Abnormal; Notable for the following components:   pCO2, Ven 63 (*)    Bicarbonate 39.9 (*)    Acid-Base Excess 12.9 (*)    All other components within normal limits  COMPREHENSIVE METABOLIC PANEL - Abnormal; Notable for the following components:   Sodium 127 (*)    Chloride 83 (*)    CO2 36 (*)    Glucose, Bld 589 (*)    BUN 27 (*)    Creatinine, Ser 1.19 (*)    Calcium 8.7 (*)    AST 13 (*)    GFR, Estimated 53 (*)    All other components within normal  limits  CBG MONITORING, ED - Abnormal; Notable for the following components:   Glucose-Capillary >600 (*)    All other components within normal limits  HEMOGLOBIN A1C  CBG MONITORING, ED  CBG MONITORING, ED  CBG MONITORING, ED    PROCEDURES:  Critical Care performed: No  Procedures   MEDICATIONS ORDERED IN ED: Medications  lactated ringers bolus 2,000 mL (0 mLs Intravenous Stopped 06/26/21 0649)  lactated ringers bolus 1,000 mL (1,000 mLs Intravenous Bolus 06/26/21 0712)     IMPRESSION / MDM / ASSESSMENT AND PLAN / ED COURSE  I reviewed the triage vital signs and the nursing notes.                              Differential diagnosis includes, but is not limited to, hyperglycemia, DKA, HHS, acute infection.  It sounds as if the patient has diabetes that has been untreated thus far.  Her symptoms are consistent with hypoglycemia.  She does not appear ill enough nor have some of the other symptoms characteristic of DKA.  We are awaiting the labs which include VBG, CBC, urinalysis, comprehensive metabolic panel.  I also ordered a hemoglobin A1c to help plan for future care.  I explained to the patient that  she may need to stay in the hospital if she has signs of a diabetic complications such as DKA.  However if she simply has a high blood glucose level we should be able to treat that.  I told her I was willing to start her on a medication like metformin, but she said that she would rather follow-up with her primary care doctor which I think is reasonable.    I reviewed her fingerstick blood glucose level and it is greater than 600.  I ordered LR 2 L IV bolus while awaiting lab work.  I reviewed her urinalysis which is reassuring because there are no ketones, just glucosuria.  No evidence of infection.  Of note, the patient was initially hypoxemic but it is because she uses 4 L of oxygen at baseline and had turned her oxygen tank down because she was afraid she was going to run out.  When she was put back on 4 L of oxygen she is breathing easily and comfortably and in no distress.  The patient is on the cardiac monitor to evaluate for evidence of arrhythmia and/or significant heart rate changes.  Clinical Course as of 06/26/21 0820  Wynelle Link Jun 26, 2021  1610 Unfortunately the patient's initial tube for metabolic panel hemolyzed.  The nurses been unable to obtain another sample.  We are waiting for the lab to come draw her blood. [CF]  [id number] I reviewed the patient's CMP which is generally reassuring; although she has a glucose of 589, her anion gap is normal and there are no significant electrolyte abnormalities.  Her CBC is within normal limits with a mild leukocytosis of 12.7 which is not clearly clinically significant.  Her VBG is notable for some hypercapnia but I believe this is due to her body habitus and chronic respiratory failure rather than an acute issue.  She is showing no signs of hypercapnia or respiratory distress and I believe this is likely chronic.  Given the degree of hyperglycemia, I have ordered 1/3 L of LR.  I do not think it is beneficial to the patient to artificially and temporarily  lower her glucose numbers by giving her some insulin which will wear off  soon.  I talked with her about this.  I once again offered to write a prescription for metformin but she declines.  At this point the plan is for her to complete the third liter of fluids and to recheck a CBG.  I discussed the case with Dr. Sidney Ace who is taking over for me.  If the patient remains stable and the blood sugar has come down even somewhat, she can be discharged for outpatient follow-up.  The patient says she will call her primary care doctor first thing in the morning.  I gave my usual and customary return precautions. [CF]    Clinical Course User Index [CF] Loleta Rose, MD     FINAL CLINICAL IMPRESSION(S) / ED DIAGNOSES   Final diagnoses:  Hyperglycemia     Rx / DC Orders   ED Discharge Orders     None        Note:  This document was prepared using Dragon voice recognition software and may include unintentional dictation errors.   Loleta Rose, MD 06/26/21 [phone]

## 2021-06-26 NOTE — ED Notes (Addendum)
Critical Glucose of 589 mg/dL Dr York Cerise notified

## 2021-06-26 NOTE — Discharge Instructions (Addendum)
Although we are not giving you a diagnosis of diabetes at this time, it is very likely that you have diabetes based on your blood glucose level which was elevated to greater than 600.  Fortunately you had no evidence of the complications that we keep you in the hospital, such as DKA.  We offered to start you on a medication such as metformin to begin treating probable diabetes, but you would rather follow-up with your primary care doctor which is fine.  However we encourage you to call the office first thing in the morning to schedule the next available follow-up appointment.  You can tell your doctor that we ordered a hemoglobin A1c, and the results should be available in epic for his or her review.  Please read through the included information and try to stick with a diabetic friendly diet.  Drink plenty of water to stay hydrated.  Return to the emergency department if you develop new or worsening symptoms that concern you.

## 2021-08-27 NOTE — Progress Notes (Deleted)
? ?Subjective:  ? ? Patient ID: [name], female    DOB: 21-Mar-1966, 56 y.o.   MRN: [id number] ? ?HPI  ? ?[name] is a 56 y/o female with a history of COPD, HTN, hyperlipidemia, anxiety, current tobacco use and chronic heart failure.  ? ?Patient had an Echo 12/21/19 that showed EF 65-70%, which is improved from TEE on 09/20/17 that showed EF of 60%. ? ?Was in the ED 06/26/21 due to hyperglycemia with glucose of >600. 3L of fluid given with glucose improving into the 400's and she was released. No DKA.    ? ?Patient presents for a follow-up visit with a chief complaint of ? ?Past Medical History:  ?Diagnosis Date  ? Anxiety   ? CHF (congestive heart failure) (HCC)   ? COPD (chronic obstructive pulmonary disease) (HCC)   ? Essential hypertension   ? History of echocardiogram   ? a. 08/2013 Echo: EF 55-60%, impaired LV relaxation, mild LVH, nl RV fxn, nl RVSP, mild TR; b. 05/2017 Echo: EF 65-70%, no rwma, nl RV fxn.  ? Morbid obesity (HCC)   ? Tobacco abuse   ? ?Past Surgical History:  ?Procedure Laterality Date  ? ABDOMINAL HYSTERECTOMY    ? CHOLECYSTECTOMY    ? EXTUBATION (ENDOTRACHEAL) IN OR N/A 08/14/2017  ? Procedure: EXTUBATION (ENDOTRACHEAL) IN OR;  Surgeon: Geanie Logan, MD;  Location: [facility] ORS;  Service: ENT;  Laterality: N/A;  ? HERNIA REPAIR    ? TEE WITHOUT CARDIOVERSION N/A 09/20/2017  ? Procedure: TRANSESOPHAGEAL ECHOCARDIOGRAM (TEE);  Surgeon: Laurier Nancy, MD;  Location: [facility] ORS;  Service: Cardiovascular;  Laterality: N/A;  ? ?Family History  ?Problem Relation Age of Onset  ? Dementia Mother   ? Heart attack Father   ? ?Social History  ? ?Tobacco Use  ? Smoking status: Every Day  ?  Packs/day: 0.50  ?  Years: 40.00  ?  Pack years: 20.00  ?  Types: Cigarettes  ? Smokeless tobacco: Never  ?Substance Use Topics  ? Alcohol use: No  ? ?No Known Allergies ? ? ? ?Review of Systems  ?Constitutional:  Positive for fatigue. Negative for appetite change.  ?HENT:  Negative for congestion, postnasal drip and sore  throat.   ?Eyes: Negative.   ?Respiratory:  Positive for cough and shortness of breath (with minimal exertion (washing dishes)). Negative for chest tightness.   ?Cardiovascular:  Negative for chest pain, palpitations and leg swelling.  ?Gastrointestinal:  Positive for constipation (Chronic). Negative for abdominal distention and abdominal pain.  ?Endocrine: Negative.   ?Genitourinary: Negative.   ?Musculoskeletal:  Positive for back pain. Negative for neck pain.  ?Skin: Negative.   ?Allergic/Immunologic: Negative.   ?Neurological:  Positive for dizziness (Positional change) and numbness ("sensation" in feet intermittent during the day). Negative for weakness and light-headedness.  ?Hematological:  Negative for adenopathy. Does not bruise/bleed easily.  ?Psychiatric/Behavioral:  Positive for sleep disturbance (chronic trouble sleeping; wearing oxygen at 4L). Negative for dysphoric mood. The patient is not nervous/anxious.   ? ?   ?Objective:  ? ? ? ?Physical Exam ?Constitutional:   ?   Appearance: She is obese. She is not ill-appearing.  ?HENT:  ?   Head: Normocephalic and atraumatic.  ?Cardiovascular:  ?   Rate and Rhythm: Normal rate and regular rhythm.  ?   Heart sounds: Normal heart sounds.  ?Pulmonary:  ?   Effort: Pulmonary effort is normal.  ?   Breath sounds: No wheezing or rales.  ?Abdominal:  ?  General: There is no distension.  ?   Palpations: Abdomen is soft.  ?   Tenderness: There is no abdominal tenderness.  ?Musculoskeletal:     ?   General: No tenderness.  ?   Cervical back: Normal range of motion and neck supple.  ?   Right lower leg: No edema.  ?   Left lower leg: No edema.  ?Skin: ?   General: Skin is warm and dry.  ?Neurological:  ?   General: No focal deficit present.  ?   Mental Status: She is alert and oriented to person, place, and time.  ?Psychiatric:     ?   Mood and Affect: Mood normal.     ?   Behavior: Behavior normal.     ?   Thought Content: Thought content normal.     ?   Judgment:  Judgment normal.  ? ?  ?Assessment & Plan:  ? ?1: Chronic heart failure with preserved ejection fraction with structural changes (mild LVH)- ?- NYHA class III ?- euvolemic today ?- weighing daily and reminded to call for an overnight weight gain of >2 pounds or a weekly weight gain of >5 pounds ?- weight 268 pounds from last visit here 6 months ago ?- admits to not being very active as she feels like she's still recovering from covid that she contracted 2 months ago ?- Encouraged to watch salt intake, try to keep it down to 2000mg  per day of sodium.  ?- still drinking 3-4 cans of pepsi and 2-3 bottles of water daily ?- BNP was 80.4 on 02/23/20 ? ?2: COPD:  ?- Wearing 4L O2 at baseline ?- Patient reports she continues to smoke 1/2 PPD and has been smoking for 44 years ?- does report removing herself from oxygen and turing it off when smoking ? ?3: HTN- ?- BP  ?- saw PCP ([name]) 07/15/21 ?- BMP on 07/15/21 showed Sodium 138, Potassium 3.8, Creatinine 1.09, BUN 59 ? ?4: DM- ?- A1c 06/26/21 was 11.0% ? ? ?Medication list reviewed.  ? ? ?

## 2021-08-29 ENCOUNTER — Ambulatory Visit: Payer: Medicaid Other | Admitting: Family

## 2021-09-15 ENCOUNTER — Encounter: Payer: Self-pay | Admitting: Family

## 2021-09-15 ENCOUNTER — Ambulatory Visit: Payer: Medicaid Other | Attending: Family | Admitting: Family

## 2021-09-15 VITALS — BP 97/53 | HR 102 | Resp 20 | Ht 60.0 in | Wt 259.0 lb

## 2021-09-15 DIAGNOSIS — J449 Chronic obstructive pulmonary disease, unspecified: Secondary | ICD-10-CM | POA: Insufficient documentation

## 2021-09-15 DIAGNOSIS — Z79899 Other long term (current) drug therapy: Secondary | ICD-10-CM | POA: Diagnosis not present

## 2021-09-15 DIAGNOSIS — I110 Hypertensive heart disease with heart failure: Secondary | ICD-10-CM | POA: Insufficient documentation

## 2021-09-15 DIAGNOSIS — I5032 Chronic diastolic (congestive) heart failure: Secondary | ICD-10-CM | POA: Diagnosis not present

## 2021-09-15 DIAGNOSIS — F17210 Nicotine dependence, cigarettes, uncomplicated: Secondary | ICD-10-CM | POA: Insufficient documentation

## 2021-09-15 DIAGNOSIS — Z8249 Family history of ischemic heart disease and other diseases of the circulatory system: Secondary | ICD-10-CM | POA: Insufficient documentation

## 2021-09-15 DIAGNOSIS — E785 Hyperlipidemia, unspecified: Secondary | ICD-10-CM | POA: Diagnosis not present

## 2021-09-15 DIAGNOSIS — I10 Essential (primary) hypertension: Secondary | ICD-10-CM

## 2021-09-15 DIAGNOSIS — E1122 Type 2 diabetes mellitus with diabetic chronic kidney disease: Secondary | ICD-10-CM

## 2021-09-15 DIAGNOSIS — E1140 Type 2 diabetes mellitus with diabetic neuropathy, unspecified: Secondary | ICD-10-CM | POA: Diagnosis not present

## 2021-09-15 DIAGNOSIS — Z7984 Long term (current) use of oral hypoglycemic drugs: Secondary | ICD-10-CM | POA: Diagnosis not present

## 2021-09-15 DIAGNOSIS — N1831 Chronic kidney disease, stage 3a: Secondary | ICD-10-CM

## 2021-09-15 NOTE — Patient Instructions (Addendum)
Continue weighing daily and call for an overnight weight gain of 3 pounds or more or a weekly weight gain of more than 5 pounds. ? ? ?If you have voicemail, please make sure your mailbox is cleaned out so that we may leave a message and please make sure to listen to any voicemails.  ? ? ?Decrease your fluid pill to 20mg  daily. If you have above weight gain or swelling, take 40mg  instead of 20mg  ?

## 2021-09-15 NOTE — Progress Notes (Signed)
? Patient ID: [name], female    DOB: 1965/12/17, 56 y.o.   MRN: [id number] ? ?HPI  ? ?[name] is a 56 y/o female with a history of COPD, HTN, hyperlipidemia, anxiety, current tobacco use and chronic heart failure.  ? ?Patient had an Echo 12/21/19 that showed EF 65-70%, which is improved from TEE on 09/20/17 that showed EF of 60%. ? ?Was in the ED 06/26/21 due to hyperglycemia with glucose of >600. 3L of fluid given with glucose improving into the 400's and she was released. No DKA.    ? ?Patient presents for a follow-up visit with a chief complaint of moderate fatigue upon minimal exertion. Describes this as chronic in nature. She has associated cough, shortness of breath, dizziness, neuropathy, chronic constipation, difficulty sleeping (used to work 3rd shift) and chronic back pain along with this. She denies any abdominal distention, palpitations, pedal edema, chest pain, weakness or weight gain.  ? ?Has been started on metformin by her PCP but she's been unable to tolerate the 1000mg  BID as it is causing significant GI issues with the PM dose so she's only taking 1000mg  QAM. She is going to try taking 500mg  in the PM and see if she can tolerate that dose.  ? ?Has been taking furosemide 40mg  daily.  ? ?Glucose has ranged from 90-147 over the last 2 weeks.  ? ?Past Medical History:  ?Diagnosis Date  ? Anxiety   ? CHF (congestive heart failure) ([place])   ? COPD (chronic obstructive pulmonary disease) ([place])   ? Essential hypertension   ? History of echocardiogram   ? a. 08/2013 Echo: EF 55-60%, impaired LV relaxation, mild LVH, nl RV fxn, nl RVSP, mild TR; b. 05/2017 Echo: EF 65-70%, no rwma, nl RV fxn.  ? Morbid obesity ([place])   ? Tobacco abuse   ? ?Past Surgical History:  ?Procedure Laterality Date  ? ABDOMINAL HYSTERECTOMY    ? CHOLECYSTECTOMY    ? EXTUBATION (ENDOTRACHEAL) IN OR N/A 08/14/2017  ? Procedure: EXTUBATION (ENDOTRACHEAL) IN OR;  Surgeon: Clyde Canterbury, MD;  Location: [facility];  Service: ENT;  Laterality:  N/A;  ? HERNIA REPAIR    ? TEE WITHOUT CARDIOVERSION N/A 09/20/2017  ? Procedure: TRANSESOPHAGEAL ECHOCARDIOGRAM (TEE);  Surgeon: Dionisio David, MD;  Location: [facility];  Service: Cardiovascular;  Laterality: N/A;  ? ?Family History  ?Problem Relation Age of Onset  ? Dementia Mother   ? Heart attack Father   ? ?Social History  ? ?Tobacco Use  ? Smoking status: Every Day  ?  Packs/day: 0.50  ?  Years: 40.00  ?  Pack years: 20.00  ?  Types: Cigarettes  ? Smokeless tobacco: Never  ?Substance Use Topics  ? Alcohol use: No  ? ?No Known Allergies ? ?Prior to Admission medications   ?Medication Sig Start Date End Date Taking? Authorizing Provider  ?ADVAIR HFA 115-21 MCG/ACT inhaler Inhale 2 puffs into the lungs 2 (two) times daily. 11/21/19  Yes [provider]  ?albuterol (PROVENTIL HFA;VENTOLIN HFA) 108 (90 Base) MCG/ACT inhaler Inhale 2 puffs into the lungs every 6 (six) hours as needed for wheezing or shortness of breath. 06/12/17  Yes Gouru, Aruna, MD  ?aspirin EC 81 MG tablet Take 81 mg by mouth daily. Swallow whole.   Yes [provider]  ?fluticasone (FLONASE) 50 MCG/ACT nasal spray Place 1 spray into both nostrils daily as needed. 02/21/20  Yes [provider]  ?furosemide (LASIX) 40 MG tablet Take 40 mg by  mouth daily. 11/21/19  Yes [provider]  ?ipratropium-albuterol (DUONEB) 0.5-2.5 (3) MG/3ML SOLN Inhale 3 mLs into the lungs every 6 (six) hours as needed. 01/02/20  Yes Jennye Boroughs, MD  ?metFORMIN (GLUCOPHAGE) 1000 MG tablet Take 1,000 mg by mouth daily with breakfast.   Yes [provider]  ?potassium chloride 20 MEQ TBCR Take 40 mEq by mouth daily. 02/24/20  Yes Shelly Coss, MD  ?rosuvastatin (CRESTOR) 20 MG tablet Take 20 mg by mouth daily.   Yes [provider]  ?SPIRIVA HANDIHALER 18 MCG inhalation capsule Place 1 capsule into inhaler and inhale daily. 11/21/19  Yes [provider]  ?venlafaxine XR (EFFEXOR-XR) 75 MG 24 hr capsule Take 75 mg  by mouth daily with breakfast.   Yes [provider]  ? ?Review of Systems  ?Constitutional:  Positive for fatigue (easily). Negative for appetite change.  ?HENT:  Negative for congestion, postnasal drip and sore throat.   ?Eyes: Negative.   ?Respiratory:  Positive for cough and shortness of breath (with minimal exertion (washing dishes)). Negative for chest tightness.   ?Cardiovascular:  Negative for chest pain, palpitations and leg swelling.  ?Gastrointestinal:  Positive for constipation (Chronic). Negative for abdominal distention and abdominal pain.  ?Endocrine: Negative.   ?Genitourinary: Negative.   ?Musculoskeletal:  Positive for back pain. Negative for neck pain.  ?Skin: Negative.   ?Allergic/Immunologic: Negative.   ?Neurological:  Positive for dizziness (Positional change) and numbness ("sensation" in feet intermittent during the day). Negative for weakness and light-headedness.  ?Hematological:  Negative for adenopathy. Does not bruise/bleed easily.  ?Psychiatric/Behavioral:  Positive for sleep disturbance (chronic trouble sleeping; wearing oxygen at 4L). Negative for dysphoric mood. The patient is not nervous/anxious.   ? ?   ?Objective:  ? ?Vitals:  ? 09/15/21 1503  ?BP: (!) 97/53  ?Pulse: (!) 102  ?Resp: 20  ?SpO2: 92%  ?Weight: 259 lb (117.5 kg)  ?Height: 5' (1.524 m)  ? ?Wt Readings from Last 3 Encounters:  ?09/15/21 259 lb (117.5 kg)  ?06/26/21 260 lb (117.9 kg)  ?02/28/21 268 lb (121.6 kg)  ? ?Lab Results  ?Component Value Date  ? CREATININE 1.19 (H) 06/26/2021  ? CREATININE 0.97 02/24/2020  ? CREATININE 0.92 02/23/2020  ? ?Physical Exam ?Constitutional:   ?   Appearance: She is obese. She is not ill-appearing.  ?HENT:  ?   Head: Normocephalic and atraumatic.  ?Cardiovascular:  ?   Rate and Rhythm: Regular rhythm. Tachycardia present.  ?   Heart sounds: Normal heart sounds.  ?Pulmonary:  ?   Effort: Pulmonary effort is normal.  ?   Breath sounds: No wheezing or rales.  ?Abdominal:  ?    General: There is no distension.  ?   Palpations: Abdomen is soft.  ?   Tenderness: There is no abdominal tenderness.  ?Musculoskeletal:     ?   General: No tenderness.  ?   Cervical back: Normal range of motion and neck supple.  ?   Right lower leg: No edema.  ?   Left lower leg: No edema.  ?Skin: ?   General: Skin is warm and dry.  ?Neurological:  ?   General: No focal deficit present.  ?   Mental Status: She is alert and oriented to person, place, and time.  ?Psychiatric:     ?   Mood and Affect: Mood normal.     ?   Behavior: Behavior normal.     ?   Thought Content: Thought content normal.     ?  Judgment: Judgment normal.  ? ?  ?Assessment & Plan:  ? ?1: Chronic heart failure with preserved ejection fraction with structural changes (mild LVH)- ?- NYHA class III ?- euvolemic today ?- weighing daily and reminded to call for an overnight weight gain of >2 pounds or a weekly weight gain of >5 pounds ?- weight down 9 pounds from last visit here 6 months ago ?- admits to not being very active  ?- Encouraged to watch salt intake, try to keep it down to 2000mg  per day of sodium.  ?- current BP unable to tolerate GDMT ?- will decrease her furosemide from 40mg  daily to 20mg  daily; should she have above weight gain, swelling or SOB, she can take the 40mg  dose ?- BNP was 80.4 on 02/23/20 ? ?2: COPD:  ?- Wearing 3-4L O2 at baseline ?- Patient reports she continues to smoke 1/2 PPD and has been smoking for 44 years ?- does report removing herself from oxygen and turing it off when smoking ? ?3: HTN- ?- BP soft (97/53) ?- decreasing furosemide per above ?- saw PCP ([name]) 07/15/21 ?- BMP on 07/15/21 showed Sodium 138, Potassium 3.8, Creatinine 1.09, BUN 59 ? ?4: DM- ?- A1c 06/26/21 was 11.0% ?- reports glucose levels ranging from 90-147 over the last 2 weeks ?- currently taking 1000mg  metformin AM as she couldn't tolerate it BID; she will try taking 500mg  in the PM ?- returns to PCP in ~ 2 weeks ? ? ?Medication list reviewed.   ? ?Return in 3 months, sooner if needed ? ? ?

## 2021-12-25 NOTE — Progress Notes (Deleted)
Patient ID: [name], female    DOB: 1965-07-09, 56 y.o.   MRN: [id number]  HPI   [name] is a 56 y/o female with a history of COPD, HTN, hyperlipidemia, anxiety, current tobacco use and chronic heart failure.   Patient had an Echo 12/21/19 that showed EF 65-70%, which is improved from TEE on 09/20/17 that showed EF of 60%.  Has not been admitted or been in the ED in the last 6 months.   Patient presents for a follow-up visit with a chief complaint of     Past Medical History:  Diagnosis Date   Anxiety    CHF (congestive heart failure) ([facility])    COPD (chronic obstructive pulmonary disease) ([facility])    Essential hypertension    History of echocardiogram    a. 08/2013 Echo: EF 55-60%, impaired LV relaxation, mild LVH, nl RV fxn, nl RVSP, mild TR; b. 05/2017 Echo: EF 65-70%, no rwma, nl RV fxn.   Morbid obesity ([facility])    Tobacco abuse    Past Surgical History:  Procedure Laterality Date   ABDOMINAL HYSTERECTOMY     CHOLECYSTECTOMY     EXTUBATION (ENDOTRACHEAL) IN OR N/A 08/14/2017   Procedure: EXTUBATION (ENDOTRACHEAL) IN OR;  Surgeon: Geanie Logan, MD;  Location: [facility];  Service: ENT;  Laterality: N/A;   HERNIA REPAIR     TEE WITHOUT CARDIOVERSION N/A 09/20/2017   Procedure: TRANSESOPHAGEAL ECHOCARDIOGRAM (TEE);  Surgeon: Laurier Nancy, MD;  Location: [facility];  Service: Cardiovascular;  Laterality: N/A;   Family History  Problem Relation Age of Onset   Dementia Mother    Heart attack Father    Social History   Tobacco Use   Smoking status: Every Day    Packs/day: 0.50    Years: 40.00    Total pack years: 20.00    Types: Cigarettes   Smokeless tobacco: Never  Substance Use Topics   Alcohol use: No   No Known Allergies   Review of Systems  Constitutional:  Positive for fatigue (easily). Negative for appetite change.  HENT:  Negative for congestion, postnasal drip and sore throat.   Eyes: Negative.   Respiratory:  Positive for cough and shortness of breath (with  minimal exertion (washing dishes)). Negative for chest tightness.   Cardiovascular:  Negative for chest pain, palpitations and leg swelling.  Gastrointestinal:  Positive for constipation (Chronic). Negative for abdominal distention and abdominal pain.  Endocrine: Negative.   Genitourinary: Negative.   Musculoskeletal:  Positive for back pain. Negative for neck pain.  Skin: Negative.   Allergic/Immunologic: Negative.   Neurological:  Positive for dizziness (Positional change) and numbness ("sensation" in feet intermittent during the day). Negative for weakness and light-headedness.  Hematological:  Negative for adenopathy. Does not bruise/bleed easily.  Psychiatric/Behavioral:  Positive for sleep disturbance (chronic trouble sleeping; wearing oxygen at 4L). Negative for dysphoric mood. The patient is not nervous/anxious.        Objective:     Physical Exam Constitutional:      Appearance: She is obese. She is not ill-appearing.  HENT:     Head: Normocephalic and atraumatic.  Cardiovascular:     Rate and Rhythm: Regular rhythm. Tachycardia present.     Heart sounds: Normal heart sounds.  Pulmonary:     Effort: Pulmonary effort is normal.     Breath sounds: No wheezing or rales.  Abdominal:     General: There is no distension.     Palpations: Abdomen is soft.  Tenderness: There is no abdominal tenderness.  Musculoskeletal:        General: No tenderness.     Cervical back: Normal range of motion and neck supple.     Right lower leg: No edema.     Left lower leg: No edema.  Skin:    General: Skin is warm and dry.  Neurological:     General: No focal deficit present.     Mental Status: She is alert and oriented to person, place, and time.  Psychiatric:        Mood and Affect: Mood normal.        Behavior: Behavior normal.        Thought Content: Thought content normal.        Judgment: Judgment normal.      Assessment & Plan:   1: Chronic heart failure with preserved  ejection fraction with structural changes (mild LVH)- - NYHA class III - euvolemic today - weighing daily and reminded to call for an overnight weight gain of >2 pounds or a weekly weight gain of >5 pounds - weight 259 pounds from last visit here 3 months ago - admits to not being very active  - Encouraged to watch salt intake, try to keep it down to 2000mg  per day of sodium.  - current BP unable to tolerate GDMT - BNP was 80.4 on 02/23/20  2: COPD:  - Wearing 3-4L O2 at baseline - Patient reports she continues to smoke 1/2 PPD and has been smoking for 44 years - does report removing herself from oxygen and turing it off when smoking  3: HTN- - BP  - saw PCP ([name]) 07/15/21 - BMP on 07/15/21 showed Sodium 138, Potassium 3.8, Creatinine 1.09, BUN 59  4: DM- - A1c 06/26/21 was 11.0% - reports glucose levels ranging from  - returns to PCP in ~ 2 weeks   Medication list reviewed.

## 2021-12-26 ENCOUNTER — Telehealth: Payer: Self-pay | Admitting: Family

## 2021-12-26 ENCOUNTER — Ambulatory Visit: Payer: Medicaid Other | Admitting: Family

## 2021-12-26 NOTE — Telephone Encounter (Signed)
Patient did not show for her [facility] appointment on 12/26/21. Will attempt to reschedule.
